# Patient Record
Sex: Female | Born: 1935
Health system: Southern US, Community
[De-identification: ages and names within clinical notes are randomized; demographics above are authoritative.]

## PROBLEM LIST (undated history)

## (undated) DIAGNOSIS — M1990 Unspecified osteoarthritis, unspecified site: Secondary | ICD-10-CM

## (undated) DIAGNOSIS — C801 Malignant (primary) neoplasm, unspecified: Secondary | ICD-10-CM

## (undated) DIAGNOSIS — I10 Essential (primary) hypertension: Secondary | ICD-10-CM

## (undated) DIAGNOSIS — E119 Type 2 diabetes mellitus without complications: Secondary | ICD-10-CM

## (undated) DIAGNOSIS — I83893 Varicose veins of bilateral lower extremities with other complications: Secondary | ICD-10-CM

## (undated) DIAGNOSIS — J449 Chronic obstructive pulmonary disease, unspecified: Secondary | ICD-10-CM

## (undated) DIAGNOSIS — N189 Chronic kidney disease, unspecified: Secondary | ICD-10-CM

## (undated) HISTORY — PX: WRIST FRACTURE SURGERY: SHX121

## (undated) HISTORY — DX: Chronic obstructive pulmonary disease, unspecified: J44.9

## (undated) HISTORY — PX: FRACTURE SURGERY: SHX138

## (undated) HISTORY — PX: BLADDER SUSPENSION: SHX72

## (undated) HISTORY — DX: Type 2 diabetes mellitus without complications: E11.9

## (undated) HISTORY — DX: Varicose veins of bilateral lower extremities with other complications: I83.893

## (undated) HISTORY — PX: COLONOSCOPY: SHX174

## (undated) HISTORY — PX: OTHER SURGICAL HISTORY: SHX169

## (undated) HISTORY — DX: Essential (primary) hypertension: I10

## (undated) MED FILL — Iron Sucrose Inj 20 MG/ML (Fe Equiv): INTRAVENOUS | Qty: 10 | Status: AC

---

## 1967-07-18 HISTORY — PX: THYROIDECTOMY: SHX17

## 1970-07-17 DIAGNOSIS — Z90710 Acquired absence of both cervix and uterus: Secondary | ICD-10-CM | POA: Insufficient documentation

## 1970-07-17 HISTORY — PX: ABDOMINAL HYSTERECTOMY: SHX81

## 1970-07-17 HISTORY — PX: CYSTOSCOPY: SUR368

## 1973-07-17 HISTORY — PX: CHOLECYSTECTOMY: SHX55

## 1997-07-17 HISTORY — PX: BREAST EXCISIONAL BIOPSY: SUR124

## 2000-08-27 DIAGNOSIS — E1140 Type 2 diabetes mellitus with diabetic neuropathy, unspecified: Secondary | ICD-10-CM | POA: Insufficient documentation

## 2001-08-02 DIAGNOSIS — J45909 Unspecified asthma, uncomplicated: Secondary | ICD-10-CM | POA: Insufficient documentation

## 2002-02-07 DIAGNOSIS — N393 Stress incontinence (female) (male): Secondary | ICD-10-CM | POA: Insufficient documentation

## 2002-07-30 DIAGNOSIS — E039 Hypothyroidism, unspecified: Secondary | ICD-10-CM | POA: Insufficient documentation

## 2004-08-25 ENCOUNTER — Ambulatory Visit: Payer: Self-pay | Admitting: General Surgery

## 2004-12-06 ENCOUNTER — Ambulatory Visit: Payer: Self-pay | Admitting: Family Medicine

## 2005-09-07 ENCOUNTER — Ambulatory Visit: Payer: Self-pay | Admitting: General Surgery

## 2006-09-18 ENCOUNTER — Ambulatory Visit: Payer: Self-pay | Admitting: General Surgery

## 2007-02-27 ENCOUNTER — Ambulatory Visit: Payer: Self-pay | Admitting: Gastroenterology

## 2007-09-19 ENCOUNTER — Ambulatory Visit: Payer: Self-pay | Admitting: General Surgery

## 2007-12-25 ENCOUNTER — Ambulatory Visit: Payer: Self-pay | Admitting: Family Medicine

## 2008-01-27 ENCOUNTER — Ambulatory Visit: Payer: Self-pay | Admitting: Family Medicine

## 2008-02-06 ENCOUNTER — Ambulatory Visit: Payer: Self-pay | Admitting: Family Medicine

## 2008-09-22 ENCOUNTER — Ambulatory Visit: Payer: Self-pay | Admitting: General Surgery

## 2009-09-24 ENCOUNTER — Ambulatory Visit: Payer: Self-pay | Admitting: General Surgery

## 2010-04-05 ENCOUNTER — Ambulatory Visit: Payer: Self-pay | Admitting: Gastroenterology

## 2010-04-12 ENCOUNTER — Ambulatory Visit: Payer: Self-pay | Admitting: Gastroenterology

## 2010-09-26 ENCOUNTER — Ambulatory Visit: Payer: Self-pay | Admitting: General Surgery

## 2011-06-30 ENCOUNTER — Ambulatory Visit: Payer: Self-pay | Admitting: Family Medicine

## 2011-07-18 HISTORY — PX: JOINT REPLACEMENT: SHX530

## 2011-08-02 ENCOUNTER — Inpatient Hospital Stay: Payer: Self-pay | Admitting: Specialist

## 2011-08-02 DIAGNOSIS — Z8781 Personal history of (healed) traumatic fracture: Secondary | ICD-10-CM | POA: Insufficient documentation

## 2011-08-02 LAB — COMPREHENSIVE METABOLIC PANEL
Alkaline Phosphatase: 74 U/L (ref 50–136)
BUN: 26 mg/dL — ABNORMAL HIGH (ref 7–18)
Calcium, Total: 8.9 mg/dL (ref 8.5–10.1)
Co2: 29 mmol/L (ref 21–32)
EGFR (African American): >60
EGFR (Non-African Amer.): 56 — ABNORMAL LOW
Glucose: 131 mg/dL — ABNORMAL HIGH (ref 65–99)
Osmolality: 293 (ref 275–301)
SGPT (ALT): 22 U/L
Sodium: 144 mmol/L (ref 136–145)

## 2011-08-02 LAB — CBC
HCT: 32.8 % — ABNORMAL LOW (ref 35.0–47.0)
HGB: 11.1 g/dL — ABNORMAL LOW (ref 12.0–16.0)
MCV: 92 fL (ref 80–100)
Platelet: 168 10*3/uL (ref 150–440)
RBC: 3.56 10*6/uL — ABNORMAL LOW (ref 3.80–5.20)
RDW: 12.9 % (ref 11.5–14.5)
WBC: 7 10*3/uL (ref 3.6–11.0)

## 2011-08-02 LAB — APTT: Activated PTT: 32.5 secs (ref 23.6–35.9)

## 2011-08-02 LAB — URINALYSIS, COMPLETE
Glucose,UR: NEGATIVE mg/dL (ref 0–75)
Nitrite: NEGATIVE
Specific Gravity: 1.015 (ref 1.003–1.030)
WBC UR: 3 /HPF (ref 0–5)

## 2011-08-02 LAB — CK TOTAL AND CKMB (NOT AT ARMC): CK-MB: 0.6 ng/mL (ref 0.5–3.6)

## 2011-08-02 LAB — PROTIME-INR
INR: 0.9
Prothrombin Time: 12.6 secs (ref 11.5–14.7)

## 2011-08-03 LAB — CBC WITH DIFFERENTIAL/PLATELET
Basophil #: 0 10*3/uL (ref 0.0–0.1)
Eosinophil #: 0.3 10*3/uL (ref 0.0–0.7)
Eosinophil %: 2.3 %
HCT: 29.1 % — ABNORMAL LOW (ref 35.0–47.0)
Lymphocyte #: 1.9 10*3/uL (ref 1.0–3.6)
Lymphocyte %: 15 %
MCHC: 33.9 g/dL (ref 32.0–36.0)
Monocyte %: 11.5 %
Neutrophil #: 9.1 10*3/uL — ABNORMAL HIGH (ref 1.4–6.5)
RBC: 3.24 10*6/uL — ABNORMAL LOW (ref 3.80–5.20)
RDW: 14.4 % (ref 11.5–14.5)
WBC: 12.7 10*3/uL — ABNORMAL HIGH (ref 3.6–11.0)

## 2011-08-03 LAB — BASIC METABOLIC PANEL
Anion Gap: 13 (ref 7–16)
BUN: 23 mg/dL — ABNORMAL HIGH (ref 7–18)
Calcium, Total: 7.8 mg/dL — ABNORMAL LOW (ref 8.5–10.1)
EGFR (African American): 41 — ABNORMAL LOW
Glucose: 135 mg/dL — ABNORMAL HIGH (ref 65–99)
Potassium: 5.3 mmol/L — ABNORMAL HIGH (ref 3.5–5.1)
Sodium: 142 mmol/L (ref 136–145)

## 2011-08-03 LAB — POTASSIUM: Potassium: 4.9 mmol/L (ref 3.5–5.1)

## 2011-08-03 LAB — HEMOGLOBIN: HGB: 9.2 g/dL — ABNORMAL LOW (ref 12.0–16.0)

## 2011-08-04 LAB — BASIC METABOLIC PANEL
Anion Gap: 13 (ref 7–16)
BUN: 23 mg/dL — ABNORMAL HIGH (ref 7–18)
Co2: 23 mmol/L (ref 21–32)
Creatinine: 1.38 mg/dL — ABNORMAL HIGH (ref 0.60–1.30)
EGFR (African American): 47 — ABNORMAL LOW
EGFR (Non-African Amer.): 39 — ABNORMAL LOW
Glucose: 166 mg/dL — ABNORMAL HIGH (ref 65–99)
Potassium: 4.6 mmol/L (ref 3.5–5.1)

## 2011-08-05 LAB — CBC WITH DIFFERENTIAL/PLATELET
Basophil #: 0.2 10*3/uL — ABNORMAL HIGH (ref 0.0–0.1)
Basophil %: 1.3 %
Basophil: 1 %
Comment - H1-Com1: NORMAL
Eosinophil #: 0.4 10*3/uL (ref 0.0–0.7)
Eosinophil %: 2.7 %
Eosinophil: 1 %
HGB: 7 g/dL — ABNORMAL LOW (ref 12.0–16.0)
HGB: 6.3 g/dL — ABNORMAL LOW (ref 12.0–16.0)
Lymphocyte %: 12.3 %
Lymphocytes: 17 %
MCH: 30.6 pg (ref 26.0–34.0)
MCHC: 33.8 g/dL (ref 32.0–36.0)
Monocytes: 9 %
Neutrophil %: 70.6 %
Platelet: 112 10*3/uL — ABNORMAL LOW (ref 150–440)
Platelet: 110 10*3/uL — ABNORMAL LOW (ref 150–440)
RBC: 2.3 10*6/uL — ABNORMAL LOW (ref 3.80–5.20)
RDW: 14.3 % (ref 11.5–14.5)
Segmented Neutrophils: 72 %
WBC: 15.5 10*3/uL — ABNORMAL HIGH (ref 3.6–11.0)

## 2011-08-05 LAB — BASIC METABOLIC PANEL
Anion Gap: 9 (ref 7–16)
BUN: 25 mg/dL — ABNORMAL HIGH (ref 7–18)
Calcium, Total: 7.7 mg/dL — ABNORMAL LOW (ref 8.5–10.1)
Chloride: 101 mmol/L (ref 98–107)
Glucose: 142 mg/dL — ABNORMAL HIGH (ref 65–99)
Osmolality: 279 (ref 275–301)
Potassium: 4.9 mmol/L (ref 3.5–5.1)
Sodium: 136 mmol/L (ref 136–145)

## 2011-08-06 LAB — CBC WITH DIFFERENTIAL/PLATELET
Basophil #: 0.1 10*3/uL (ref 0.0–0.1)
Basophil %: 0.5 %
Eosinophil #: 0.4 10*3/uL (ref 0.0–0.7)
Eosinophil %: 3.9 %
HCT: 28.7 % — ABNORMAL LOW (ref 35.0–47.0)
Lymphocyte #: 2.1 10*3/uL (ref 1.0–3.6)
MCH: 30.4 pg (ref 26.0–34.0)
MCHC: 33.7 g/dL (ref 32.0–36.0)
Monocyte #: 1.6 10*3/uL — ABNORMAL HIGH (ref 0.0–0.7)
Monocyte %: 14 %
Neutrophil #: 7.4 10*3/uL — ABNORMAL HIGH (ref 1.4–6.5)
Neutrophil %: 63.7 %
Platelet: 124 10*3/uL — ABNORMAL LOW (ref 150–440)
RBC: 3.18 10*6/uL — ABNORMAL LOW (ref 3.80–5.20)
WBC: 11.6 10*3/uL — ABNORMAL HIGH (ref 3.6–11.0)

## 2011-08-06 LAB — BASIC METABOLIC PANEL
BUN: 27 mg/dL — ABNORMAL HIGH (ref 7–18)
Calcium, Total: 8.2 mg/dL — ABNORMAL LOW (ref 8.5–10.1)
Creatinine: 1.22 mg/dL (ref 0.60–1.30)
EGFR (African American): 55 — ABNORMAL LOW
EGFR (Non-African Amer.): 45 — ABNORMAL LOW
Glucose: 114 mg/dL — ABNORMAL HIGH (ref 65–99)
Osmolality: 284 (ref 275–301)
Potassium: 4.6 mmol/L (ref 3.5–5.1)
Sodium: 139 mmol/L (ref 136–145)

## 2011-08-07 LAB — CBC WITH DIFFERENTIAL/PLATELET
Basophil #: 0 10*3/uL (ref 0.0–0.1)
Basophil %: 0.2 %
Eosinophil #: 0.3 10*3/uL (ref 0.0–0.7)
Eosinophil %: 3 %
HGB: 8.3 g/dL — ABNORMAL LOW (ref 12.0–16.0)
Lymphocyte #: 1.5 10*3/uL (ref 1.0–3.6)
Lymphocyte %: 16.1 %
MCH: 30.8 pg (ref 26.0–34.0)
MCHC: 33.1 g/dL (ref 32.0–36.0)
Monocyte %: 11.8 %
Neutrophil %: 68.9 %
RBC: 2.69 10*6/uL — ABNORMAL LOW (ref 3.80–5.20)
WBC: 9.3 10*3/uL (ref 3.6–11.0)

## 2011-08-17 ENCOUNTER — Encounter: Payer: Self-pay | Admitting: Internal Medicine

## 2011-08-17 LAB — CBC WITH DIFFERENTIAL/PLATELET
Eosinophil #: 0 10*3/uL (ref 0.0–0.7)
HCT: 34.6 % — ABNORMAL LOW (ref 35.0–47.0)
HGB: 11.6 g/dL — ABNORMAL LOW (ref 12.0–16.0)
MCH: 31.3 pg (ref 26.0–34.0)
MCHC: 33.6 g/dL (ref 32.0–36.0)
MCV: 93 fL (ref 80–100)
Monocyte #: 0.5 10*3/uL (ref 0.0–0.7)
Monocyte %: 3.7 %
Neutrophil #: 8.6 10*3/uL — ABNORMAL HIGH (ref 1.4–6.5)
RDW: 15.1 % — ABNORMAL HIGH (ref 11.5–14.5)
WBC: 12.5 10*3/uL — ABNORMAL HIGH (ref 3.6–11.0)

## 2011-08-18 ENCOUNTER — Encounter: Payer: Self-pay | Admitting: Internal Medicine

## 2012-07-22 ENCOUNTER — Ambulatory Visit: Payer: Self-pay | Admitting: General Surgery

## 2012-08-01 DIAGNOSIS — M810 Age-related osteoporosis without current pathological fracture: Secondary | ICD-10-CM | POA: Insufficient documentation

## 2012-11-08 ENCOUNTER — Inpatient Hospital Stay: Payer: Self-pay | Admitting: Internal Medicine

## 2012-11-08 LAB — CBC WITH DIFFERENTIAL/PLATELET
Basophil #: 0.1 10*3/uL (ref 0.0–0.1)
Basophil %: 0.3 %
HCT: 33.7 % — ABNORMAL LOW (ref 35.0–47.0)
Lymphocyte #: 0.9 10*3/uL — ABNORMAL LOW (ref 1.0–3.6)
MCH: 30.6 pg (ref 26.0–34.0)
MCHC: 32.9 g/dL (ref 32.0–36.0)
Monocyte #: 1.5 x10 3/mm — ABNORMAL HIGH (ref 0.2–0.9)
Monocyte %: 7 %
Neutrophil %: 88.7 %
RBC: 3.63 10*6/uL — ABNORMAL LOW (ref 3.80–5.20)
RDW: 13.9 % (ref 11.5–14.5)
WBC: 22 10*3/uL — ABNORMAL HIGH (ref 3.6–11.0)

## 2012-11-08 LAB — URINALYSIS, COMPLETE
Glucose,UR: NEGATIVE mg/dL (ref 0–75)
Ph: 5 (ref 4.5–8.0)
Specific Gravity: 1.015 (ref 1.003–1.030)
Squamous Epithelial: 2

## 2012-11-08 LAB — COMPREHENSIVE METABOLIC PANEL
Albumin: 3.5 g/dL (ref 3.4–5.0)
Anion Gap: 9 (ref 7–16)
Bilirubin,Total: 0.7 mg/dL (ref 0.2–1.0)
Co2: 23 mmol/L (ref 21–32)
Creatinine: 1.81 mg/dL — ABNORMAL HIGH (ref 0.60–1.30)
EGFR (African American): 30 — ABNORMAL LOW
Osmolality: 289 (ref 275–301)
Potassium: 3.5 mmol/L (ref 3.5–5.1)
SGOT(AST): 35 U/L (ref 15–37)
SGPT (ALT): 21 U/L (ref 12–78)
Sodium: 137 mmol/L (ref 136–145)

## 2012-11-08 LAB — TROPONIN I
Troponin-I: 0.22 ng/mL — ABNORMAL HIGH
Troponin-I: 1.88 ng/mL — ABNORMAL HIGH

## 2012-11-08 LAB — CK TOTAL AND CKMB (NOT AT ARMC): CK, Total: 158 U/L (ref 21–215)

## 2012-11-09 LAB — LIPID PANEL
Cholesterol: 102 mg/dL (ref 0–200)
HDL Cholesterol: 34 mg/dL — ABNORMAL LOW (ref 40–60)
VLDL Cholesterol, Calc: 15 mg/dL (ref 5–40)

## 2012-11-09 LAB — BASIC METABOLIC PANEL
BUN: 28 mg/dL — ABNORMAL HIGH (ref 7–18)
Calcium, Total: 7.1 mg/dL — ABNORMAL LOW (ref 8.5–10.1)
Chloride: 109 mmol/L — ABNORMAL HIGH (ref 98–107)
Co2: 24 mmol/L (ref 21–32)
EGFR (Non-African Amer.): 36 — ABNORMAL LOW
Osmolality: 281 (ref 275–301)
Potassium: 4.2 mmol/L (ref 3.5–5.1)

## 2012-11-09 LAB — CBC WITH DIFFERENTIAL/PLATELET
Basophil %: 0.4 %
Eosinophil #: 0.1 10*3/uL (ref 0.0–0.7)
HGB: 9.3 g/dL — ABNORMAL LOW (ref 12.0–16.0)
Lymphocyte #: 2.2 10*3/uL (ref 1.0–3.6)
Lymphocyte %: 18 %
MCH: 30.3 pg (ref 26.0–34.0)
MCV: 93 fL (ref 80–100)
Monocyte #: 2.1 x10 3/mm — ABNORMAL HIGH (ref 0.2–0.9)
Monocyte %: 17.4 %
Neutrophil %: 63.7 %

## 2012-11-10 LAB — BASIC METABOLIC PANEL
Anion Gap: 6 — ABNORMAL LOW (ref 7–16)
BUN: 22 mg/dL — ABNORMAL HIGH (ref 7–18)
Calcium, Total: 7 mg/dL — CL (ref 8.5–10.1)
Chloride: 112 mmol/L — ABNORMAL HIGH (ref 98–107)
Co2: 23 mmol/L (ref 21–32)
EGFR (African American): 55 — ABNORMAL LOW
EGFR (Non-African Amer.): 47 — ABNORMAL LOW
Glucose: 129 mg/dL — ABNORMAL HIGH (ref 65–99)
Osmolality: 286 (ref 275–301)
Potassium: 3.8 mmol/L (ref 3.5–5.1)
Sodium: 141 mmol/L (ref 136–145)

## 2012-11-10 LAB — CBC WITH DIFFERENTIAL/PLATELET
Lymphocyte #: 1.7 10*3/uL (ref 1.0–3.6)
Lymphocyte %: 25.4 %
MCH: 30.7 pg (ref 26.0–34.0)
MCHC: 33.3 g/dL (ref 32.0–36.0)
Neutrophil #: 3.9 10*3/uL (ref 1.4–6.5)
Platelet: 118 10*3/uL — ABNORMAL LOW (ref 150–440)
RBC: 2.9 10*6/uL — ABNORMAL LOW (ref 3.80–5.20)
RDW: 13.6 % (ref 11.5–14.5)

## 2012-11-10 LAB — LIPID PANEL
Cholesterol: 96 mg/dL (ref 0–200)
HDL Cholesterol: 36 mg/dL — ABNORMAL LOW (ref 40–60)

## 2012-11-14 LAB — CULTURE, BLOOD (SINGLE)

## 2013-01-14 ENCOUNTER — Encounter: Payer: Self-pay | Admitting: *Deleted

## 2013-07-23 ENCOUNTER — Ambulatory Visit: Payer: Self-pay | Admitting: General Surgery

## 2013-07-24 ENCOUNTER — Encounter: Payer: Self-pay | Admitting: General Surgery

## 2013-07-30 ENCOUNTER — Ambulatory Visit: Payer: Self-pay | Admitting: General Surgery

## 2013-08-05 ENCOUNTER — Ambulatory Visit (INDEPENDENT_AMBULATORY_CARE_PROVIDER_SITE_OTHER): Payer: Medicare HMO | Admitting: General Surgery

## 2013-08-05 ENCOUNTER — Encounter: Payer: Self-pay | Admitting: General Surgery

## 2013-08-05 VITALS — BP 140/58 | HR 66 | Resp 16 | Ht 62.0 in | Wt 141.0 lb

## 2013-08-05 DIAGNOSIS — Z1239 Encounter for other screening for malignant neoplasm of breast: Secondary | ICD-10-CM

## 2013-08-05 DIAGNOSIS — N6019 Diffuse cystic mastopathy of unspecified breast: Secondary | ICD-10-CM

## 2013-08-05 NOTE — Progress Notes (Signed)
Patient ID: Robyn Butler, female   DOB: November 07, 1928, 80 y.o.   MRN: [id number]  Chief Complaint  Patient presents with  . Follow-up    mammogram    HPI Robyn Butler is a 80 y.o. female.  who presents for her annual breast evaluation. The most recent mammogram was done on 07-23-13.  Patient does perform regular self breast checks and gets regular mammograms done.  No new breast issues.  HPI  Past Medical History  Diagnosis Date  . Hypertension   . Asthma   . Thyroid disorder   . Varicose veins of lower extremities with other complications   . Diabetes mellitus without complication   . Glaucoma   . COPD (chronic obstructive pulmonary disease)     Past Surgical History  Procedure Laterality Date  . Thyroidectomy  1969  . Cystoscopy  1972  . Abdominal hysterectomy  1972  . Bladder suspension    . Cholecystectomy  1975  . Colonoscopy    . Salpingo oophorectmy     . Joint replacement  2013    hip    Family History  Problem Relation Age of Onset  . Heart attack Father   . Kidney disease Mother     Social History History  Substance Use Topics  . Smoking status: Former Smoker    Quit date: 07/18/1951  . Smokeless tobacco: Never Used  . Alcohol Use: No    No Known Allergies  Current Outpatient Prescriptions  Medication Sig Dispense Refill  . ADVAIR DISKUS 250-50 MCG/DOSE AEPB Inhale 1 puff into the lungs 2 (two) times daily.       Marland Kitchen albuterol (PROVENTIL HFA;VENTOLIN HFA) 108 (90 BASE) MCG/ACT inhaler Inhale into the lungs every 6 (six) hours as needed for wheezing or shortness of breath.      Marland Kitchen amitriptyline (ELAVIL) 10 MG tablet Take 10 mg by mouth at bedtime.      Marland Kitchen amLODipine (NORVASC) 10 MG tablet Take 10 mg by mouth daily.      Marland Kitchen aspirin 81 MG tablet Take 81 mg by mouth daily.      . Calcium Carbonate-Vitamin D 500-125 MG-UNIT TABS Take by mouth daily.      . cetirizine (ZYRTEC) 10 MG tablet Take 10 mg by mouth daily.      . dorzolamide (TRUSOPT) 2 %  ophthalmic solution 1 drop 2 (two) times daily.      Marland Kitchen glipiZIDE (GLUCOTROL XL) 2.5 MG 24 hr tablet Take 2.5 mg by mouth daily with breakfast.      . hydrochlorothiazide (HYDRODIURIL) 25 MG tablet Take 25 mg by mouth daily.       Marland Kitchen ibuprofen (ADVIL,MOTRIN) 200 MG tablet Take 200 mg by mouth every 6 (six) hours as needed.      . Iron 66 MG TABS Take by mouth daily.      Marland Kitchen latanoprost (XALATAN) 0.005 % ophthalmic solution 1 drop at bedtime.      Marland Kitchen levothyroxine (SYNTHROID, LEVOTHROID) 100 MCG tablet Take 100 mcg by mouth daily before breakfast.       . lovastatin (ALTOPREV) 40 MG 24 hr tablet Take 40 mg by mouth at bedtime.      . Multiple Vitamins-Minerals (MEGA BASIC PO) Take by mouth daily.      Marland Kitchen omeprazole (PRILOSEC) 20 MG capsule Take 20 mg by mouth daily.        No current facility-administered medications for this visit.    Review of Systems Review of Systems  Constitutional:  Negative.   Respiratory: Negative.   Cardiovascular: Negative.     Blood pressure 140/58, pulse 66, resp. rate 16, height 5\' 2"  (1.575 m), weight 141 lb (63.957 kg).  Physical Exam Physical Exam  Constitutional: She is oriented to person, place, and time. She appears well-developed and well-nourished.  Neck: Neck supple. No thyromegaly present.  Cardiovascular: Normal rate, regular rhythm and normal heart sounds.   No murmur heard. Pulmonary/Chest: Effort normal and breath sounds normal. Right breast exhibits no inverted nipple, no mass, no nipple discharge, no skin change and no tenderness. Left breast exhibits no inverted nipple, no mass, no nipple discharge, no skin change and no tenderness.  Lymphadenopathy:    She has no cervical adenopathy.    She has no axillary adenopathy.  Neurological: She is alert and oriented to person, place, and time.  Skin: Skin is warm and dry.    Data Reviewed Mammogram reviewed-stable  Assessment    Stable breast exam.      Plan    54yr f/u with bil screening  mammogram        Jaelen Soth G 08/05/2013, 8:45 PM

## 2013-08-05 NOTE — Patient Instructions (Signed)
Patient to return in 1 year with bilateral screening mammogram. Patient to call our office with any new questions or concerns. Patient to continue self breast exams.

## 2013-11-27 ENCOUNTER — Encounter: Payer: Self-pay | Admitting: Family Medicine

## 2013-12-15 ENCOUNTER — Encounter: Payer: Self-pay | Admitting: Family Medicine

## 2014-02-16 ENCOUNTER — Ambulatory Visit: Payer: Self-pay | Admitting: Family Medicine

## 2014-02-16 DIAGNOSIS — I517 Cardiomegaly: Secondary | ICD-10-CM | POA: Insufficient documentation

## 2014-02-20 ENCOUNTER — Ambulatory Visit: Payer: Self-pay | Admitting: Family Medicine

## 2014-05-18 ENCOUNTER — Encounter: Payer: Self-pay | Admitting: General Surgery

## 2014-07-24 ENCOUNTER — Ambulatory Visit: Payer: Self-pay | Admitting: General Surgery

## 2014-07-27 ENCOUNTER — Encounter: Payer: Self-pay | Admitting: General Surgery

## 2014-08-04 ENCOUNTER — Encounter: Payer: Self-pay | Admitting: General Surgery

## 2014-08-04 ENCOUNTER — Ambulatory Visit (INDEPENDENT_AMBULATORY_CARE_PROVIDER_SITE_OTHER): Payer: PPO | Admitting: General Surgery

## 2014-08-04 VITALS — BP 140/68 | HR 70 | Resp 12 | Ht 62.0 in | Wt 145.0 lb

## 2014-08-04 DIAGNOSIS — N6019 Diffuse cystic mastopathy of unspecified breast: Secondary | ICD-10-CM

## 2014-08-04 DIAGNOSIS — Z1239 Encounter for other screening for malignant neoplasm of breast: Secondary | ICD-10-CM

## 2014-08-04 NOTE — Progress Notes (Signed)
Patient ID: Robyn Butler, female   DOB: 10-27-1928, 81 y.o.   MRN: [id number]  Chief Complaint  Patient presents with  . Follow-up    mammogram    HPI Robyn Butler is a 81 y.o. female.  who presents for her annual follow up mammogram and breast evaluation. The most recent mammogram was done on 07-24-14.  Patient does perform regular self breast checks and gets regular mammograms done.    HPI  Past Medical History  Diagnosis Date  . Hypertension   . Asthma   . Thyroid disorder   . Varicose veins of lower extremities with other complications   . Diabetes mellitus without complication   . Glaucoma   . COPD (chronic obstructive pulmonary disease)     Past Surgical History  Procedure Laterality Date  . Thyroidectomy  1969  . Cystoscopy  1972  . Abdominal hysterectomy  1972  . Bladder suspension    . Cholecystectomy  1975  . Colonoscopy    . Salpingo oophorectmy     . Joint replacement  2013    hip    Family History  Problem Relation Age of Onset  . Heart attack Father   . Kidney disease Mother     Social History History  Substance Use Topics  . Smoking status: Former Smoker    Quit date: 07/18/1951  . Smokeless tobacco: Never Used  . Alcohol Use: No    No Known Allergies  Current Outpatient Prescriptions  Medication Sig Dispense Refill  . ADVAIR DISKUS 250-50 MCG/DOSE AEPB Inhale 1 puff into the lungs 2 (two) times daily.     Marland Kitchen albuterol (PROVENTIL HFA;VENTOLIN HFA) 108 (90 BASE) MCG/ACT inhaler Inhale into the lungs every 6 (six) hours as needed for wheezing or shortness of breath.    Marland Kitchen amitriptyline (ELAVIL) 10 MG tablet Take 10 mg by mouth at bedtime.    Marland Kitchen amLODipine (NORVASC) 10 MG tablet Take 10 mg by mouth daily.    Marland Kitchen aspirin 81 MG tablet Take 81 mg by mouth daily.    . Calcium Carbonate-Vitamin D 500-125 MG-UNIT TABS Take by mouth daily.    . cetirizine (ZYRTEC) 10 MG tablet Take 10 mg by mouth daily.    . dorzolamide (TRUSOPT) 2 % ophthalmic solution  1 drop 2 (two) times daily.    Marland Kitchen glipiZIDE (GLUCOTROL XL) 2.5 MG 24 hr tablet Take 2.5 mg by mouth daily with breakfast.    . hydrochlorothiazide (HYDRODIURIL) 25 MG tablet Take 25 mg by mouth daily.     Marland Kitchen ibuprofen (ADVIL,MOTRIN) 200 MG tablet Take 200 mg by mouth every 6 (six) hours as needed.    . Iron 66 MG TABS Take by mouth daily.    Marland Kitchen latanoprost (XALATAN) 0.005 % ophthalmic solution 1 drop at bedtime.    Marland Kitchen levothyroxine (SYNTHROID, LEVOTHROID) 100 MCG tablet Take 100 mcg by mouth daily before breakfast.     . lovastatin (ALTOPREV) 40 MG 24 hr tablet Take 40 mg by mouth at bedtime.    . Multiple Vitamins-Minerals (MEGA BASIC PO) Take by mouth daily.    Marland Kitchen omeprazole (PRILOSEC) 20 MG capsule Take 20 mg by mouth daily.      No current facility-administered medications for this visit.    Review of Systems Review of Systems  Constitutional: Negative.   Respiratory: Negative.   Cardiovascular: Negative.     Blood pressure 140/68, pulse 70, resp. rate 12, height 5\' 2"  (1.575 m), weight 145 lb (65.772 kg).  Physical  Exam Physical Exam  Constitutional: She is oriented to person, place, and time. She appears well-developed and well-nourished.  Eyes: Conjunctivae are normal. No scleral icterus.  Neck: Neck supple.  Cardiovascular: Normal rate, regular rhythm and normal heart sounds.   Pulmonary/Chest: Effort normal and breath sounds normal. Right breast exhibits no inverted nipple, no mass, no nipple discharge, no skin change and no tenderness. Left breast exhibits no inverted nipple, no mass, no nipple discharge, no skin change and no tenderness.  Abdominal: Soft. Bowel sounds are normal. There is no tenderness.  Lymphadenopathy:    She has no cervical adenopathy.    She has no axillary adenopathy.  Neurological: She is alert and oriented to person, place, and time.  Skin: Skin is warm and dry.    Data Reviewed Mammogram Reviewed-stable with benign calcifications  Assessment     Stable exam. FCD- no new issues      Plan    Patient will be asked to return to the office in one year with a bilateral screening mammogram.       Lashone Stauber G 08/04/2014, 10:06 AM

## 2014-08-04 NOTE — Patient Instructions (Addendum)
Patient will be asked to return to the office in one year with a bilateral screening mammogram.  Continue self breast exams. Call office for any new breast issues or concerns.  

## 2014-10-12 ENCOUNTER — Ambulatory Visit: Payer: Self-pay | Admitting: Family Medicine

## 2014-10-27 LAB — HM DIABETES EYE EXAM

## 2014-11-06 NOTE — Consult Note (Signed)
General Aspect 81 yo female admitted after being found on the floor after being too weak to stand up after gettin gout of bed. She has had nausea, vomiting and diarrhea for the past several days. She denied any chest pain. She staes she was trying to get out of bed but was too eak to stand and fell to the floor. She was found to be somewhat dehydrated on presenttion. Initial troponin was minimally elevated and second level was 1.0. EKG did not reveal any injury or ischemia. She denies chest pain.   Physical Exam:  GEN no acute distress   HEENT PERRL   NECK supple   RESP normal resp effort  clear BS  no use of accessory muscles   CARD Regular rate and rhythm  No murmur   ABD denies tenderness  no hernia   LYMPH negative neck, negative axillae   EXTR negative cyanosis/clubbing, negative edema   SKIN normal to palpation   NEURO cranial nerves intact, motor/sensory function intact   PSYCH A+O to time, place, person   Review of Systems:  General: Fatigue  Weakness   Skin: No Complaints   ENT: No Complaints   Eyes: No Complaints   Neck: No Complaints   Respiratory: No Complaints   Cardiovascular: No Complaints   Gastrointestinal: Nausea  Diarrhea   Genitourinary: No Complaints   Vascular: No Complaints   Musculoskeletal: No Complaints   Neurologic: No Complaints   Hematologic: No Complaints   Endocrine: No Complaints   Psychiatric: No Complaints   Review of Systems: All other systems were reviewed and found to be negative   Medications/Allergies Reviewed Medications/Allergies reviewed   Home Medications: Medication Instructions Status  levothyroxine 100 mcg (0.1 mg) oral capsule 1 cap(s) orally once a day Active  lovastatin 40 mg oral tablet 1 tab(s) orally once a day Active  Norvasc 5 mg oral tablet 1 tab(s) orally once a day Active  omeprazole 20 mg oral delayed release capsule 1 cap(s) orally once a day Active  amitriptyline 10 mg oral tablet 1-2  tab(s) orally once a day (at bedtime) Active  Zyrtec 10 mg oral tablet 1 tab(s) orally once a day Active  aspirin 81 mg oral tablet 1 tab(s) orally once a day Active  ferrous sulfate 325 mg (65 mg elemental iron) oral tablet 1 tab(s) orally 2 times a day Active  Glucovance 2.5 mg-500 mg oral tablet 1 tab(s) orally once a day Active  Prinzide 20 mg-12.5 mg oral tablet 2 tab(s) orally once a day Active  Advair Diskus 250 mcg-50 mcg inhalation powder 1 puff(s) inhaled 2 times a day Active  alendronate 70 mg oral tablet 1 tab(s) orally once a week Active  meloxicam 7.5 mg oral tablet 1 tab(s) orally once a day Active   EKG:  EKG NSR   Abnormal NSSTTW changes    Hespan: Unknown   Impression 81 yo female with history of nausea, vomiting and diarrhea for the past several days who was admitted after presenting to the er with comlaints of being too wak to stand at home. She was found on the floor after attempting to get out of bed. She was found to be dehydrated and has a mild troponin elevation to 1.88. etiology of this is unclear. The patient does not give a historoy of acute onset of chest pian and this is likely secondary to demand ischemia in face on dehydration and fall. Will review echo when available to ealuate wall motion.   Plan  1. Conintue with current meds holding afterload reduction and diuretics following renal funciotn 2. Echo when available 3. Gentle hydraton. 4 Further recs pending course.   Electronic Signatures: Teodoro Spray (MD)  (Signed 26-Apr-14 17:27)  Authored: General Aspect/Present Illness, History and Physical Exam, Review of System, Home Medications, EKG , Allergies, Impression/Plan   Last Updated: 26-Apr-14 17:27 by Teodoro Spray (MD)

## 2014-11-06 NOTE — Discharge Summary (Signed)
PATIENT NAME:  Robyn Butler, Robyn Butler MR#:  [id number] DATE OF BIRTH:  04-23-1929  DATE OF ADMISSION:  11/08/2012 DATE OF DISCHARGE:  11/10/2012  ADMITTING DIAGNOSES:  Weakness, nausea, vomiting, diarrhea.   DISCHARGE DIAGNOSES: 1.  Weakness due to dehydration.  2.  Nausea, vomiting, diarrhea, likely due to acute gastroenteritis, now resolved.  3.  Acute renal failure due to dehydration, status post treatment with IV fluids. Renal function back to normal.  4.  Elevated troponin without any chest pain or EKG changes.  Seen by cardiology, did not feel that this was related to ischemia. Dr. Ubaldo Glassing wants to see the patient as an outpatient next week and he will likely do a stress test. Echo did not show any significant wall motion abnormality.  5.  Possible urinary tract infection based on her urinalysis. The patient will be treated with Cipro.  6.  Anemia, likely anemia of chronic disease. Needs outpatient evaluation with her primary care provider, including she may need a colonoscopy.   PERTINENT EVALUATIONS: Urinalysis showed greater than 100,000 E. coli. UA showed 3+ bacteria, nitrites positive. WBC count was 22.0, hemoglobin 11.1, platelet count was 172. Troponin was 0.22, subsequent troponin was elevated to 1.88. Blood cultures no growth at 48 hours. CT of the abdomen and pelvis showed no definite acute abnormality, extensive sigmoid colon diverticulosis, extensive atherosclerotic calcification present, no ascites or abscess evidence. Lipid panel: Total cholesterol 102, triglycerides 76, HDL 34, LDL 53. Echocardiogram of the heart showed LVEF of 65% to 70%, mild mitral valve regurg, mild tricuspid regurg, mild elevated pulmonary hypertension. Most recent creatinine today is 1.10, sodium 141. Hemoglobin today is 8.9, platelet count is 118.   CONSULTANTS: Dr. Ubaldo Glassing.   HOSPITAL COURSE: Please refer to H and P done by the admitting physician. The patient is a pleasant 81 year old white female who woke up on the  day of admission and could not get up. The patient slid to the floor and was very weak to get up. The patient has had nausea and vomiting for the past few days. She came to the ED, was noted to be severely dehydrated, had acute renal failure. It was felt that her acute renal failure and dehydration and was due to GI volume loss. She was given IV fluids and that resolved. With IV fluids, her acute renal failure resolved as well as her weakness significantly improved. The patient's diarrhea and vomiting also resolved. She has no further GI symptoms, has been tolerating diet well. There was an incidental note of elevated troponin on presentation which increased up to 1.88. The patient did not have any chest pains. No EKG changes to suggest acute ischemia. She was seen by Dr. Ubaldo Glassing of cardiology, who went ahead and did an echocardiogram. Her echocardiogram showed no wall motion abnormality so he wants her to get over her current episode. He will see her in outpatient likely do a stress test. At this time, she is stable for discharge.   DISCHARGE MEDICATIONS: Levothyroxine 100 mcg daily, lovastatin 40 daily, Norvasc 5 daily, omeprazole 20 daily, amitriptyline 20, 1 to 2 tabs at bedtime p.r.n.; Zyrtec 10 daily, aspirin 81 mg 1 tab p.o. daily, Glucovance 2.5 to 500 one tab p.o. daily, Prinzide 20/12.5 two tabs daily, Advair 250/50 one puff b.i.d., alendronate 70 one tablet weekly, meloxicam 7.5 daily, calcium plus vitamin D 1 tab p.o. b.i.d., Cipro 500 one tab p.o. q.12 x 4 days.   DIET: Low sodium, low fat, low cholesterol, carbohydrate-controlled diet.   ACTIVITY:  As tolerated.   FOLLOWUP: With primary MD, Dr. Linton Ham, in 1 to 2 weeks.   TIME SPENT: 35 minutes spent.    ____________________________ Robyn Butler. [name], MD shp:cs D: 11/10/2012 12:56:00 ET T: 11/10/2012 14:36:26 ET JOB#: [id number]  cc: Jenayah Antu H. [name], MD, <Dictator> Alric Seton MD ELECTRONICALLY SIGNED 11/15/2012 20:05

## 2014-11-06 NOTE — H&P (Signed)
PATIENT NAME:  Robyn Butler, Robyn Butler MR#:  [id number] DATE OF BIRTH:  [birth year]  DATE OF ADMISSION:  11/08/2012  PRIMARY CARE PHYSICIAN: Kirstie Peri. Caryn Section, MD  CHIEF COMPLAINT: Weakness.   HISTORY OF PRESENT ILLNESS: This is an 81 year old female who woke up today and could not get up. She slid to the floor and could not get up from the floor. She has a [employer] that she pressed and they took her to the Emergency Room. She has had an upset stomach for the past few days with vomiting and diarrhea. She has not had any more symptoms since last night. She has abdominal pain when the ER physician pressed down in the left lower quadrant. She does have dark stools secondary to iron that she takes. No blood in the vomit. No bright red blood in the diarrhea. The patient said she had some fever, low-grade, and also some sweating this a.m. when she was trying to get up. She feels very weak. In the ER, she was found to be in acute renal failure, a borderline troponin and elevated white count, a urinalysis that was borderline. Hospitalist services were contacted for further evaluation.   PAST MEDICAL HISTORY: Diabetes, glaucoma, COPD, hypothyroidism and hyperlipidemia.   PAST SURGICAL HISTORY: Right partial hip replacement, hysterectomy, cholecystectomy and goiter.   ALLERGIES: IN THE COMPUTER HERE IS HESPAN.   MEDICATIONS: As per Prescription Writer include Advair Diskus 250/50 one inhalation twice a day, alendronate 70 mg once a week, amitriptyline 10 mg 1 to 2 tablets at night, aspirin 81 mg daily, ferrous sulfate 325 mg twice a day, Glucovance 2.5/500 one tablet daily, levothyroxine 100 mcg daily, lovastatin 40 mg daily, meloxicam 7.5 mg daily, Norvasc 5 mg daily, omeprazole 20 mg extended-release daily, Prinzide 20/12.5 two tablets daily, Zyrtec 10 mg daily.   SOCIAL HISTORY: Lives alone. Quit smoking in 1993. No alcohol. No drug use. 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   FAMILY HISTORY: Father died at age 65  in his sleep. He had a history of asthma, likely a cardiac event. Mother died of uremic poisoning.   REVIEW OF SYSTEMS:    CONSTITUTIONAL: Positive for fever. Positive for sweating. No chills. Positive for weakness. Positive for weight gain.  EYES: She does wear glasses and has glaucoma.  EARS, NOSE, MOUTH AND THROAT: Decreased hearing. Decreased runny nose. Positive for sore throat.  CARDIOVASCULAR: No chest pain. No palpitations.  RESPIRATORY: Positive for shortness of breath with occasional cough. No hemoptysis.  GASTROINTESTINAL: Positive for nausea. Positive for vomiting. No hematemesis. Positive for abdominal pain with palpation. Positive for diarrhea. No bright red blood per rectum. Positive for dark stools with iron.  GENITOURINARY: No burning on urination. No hematuria.  MUSCULOSKELETAL: No joint pain or muscle pain.  INTEGUMENTARY: No rashes or eruptions.  NEUROLOGIC: No fainting or blackouts.  PSYCHIATRIC: No anxiety or depression.  ENDOCRINE: No thyroid problems.  HEMATOLOGIC AND LYMPHATIC: No anemia.   PHYSICAL EXAMINATION:  VITAL SIGNS: Temperature 98.6, pulse 81, respirations 18, blood pressure 129/45, pulse oximetry 98% on room air.  GENERAL: No respiratory distress.  EYES: Conjunctivae and lids normal. Pupils equal, round and reactive to light. Extraocular muscles intact. No nystagmus.  EARS, NOSE, MOUTH AND THROAT: Tympanic membranes: No erythema. Nasal mucosa: No erythema. Throat: No erythema, no exudate seen. Lips and gums: No lesions.  NECK: No JVD. No bruits. No lymphadenopathy. No thyromegaly. No thyroid nodules palpated.  LUNGS: Clear to auscultation. No use of accessory muscles to breathe. No  rhonchi, rales or wheeze heard.  CARDIOVASCULAR: S1, S2 normal. No gallops, rubs or murmurs heard. Carotid upstroke 2+ bilaterally. No bruits.  EXTREMITIES: Dorsalis pedis pulses 2+ bilaterally. No edema of the lower extremities.  ABDOMEN: Soft. Positive tenderness in the left  lower quadrant. No organomegaly/splenomegaly. Normoactive bowel sounds. No masses felt.  LYMPHATIC: No lymph nodes in the neck.  MUSCULOSKELETAL: No clubbing, edema or cyanosis.  SKIN: No ulcers or lesions seen.  NEUROLOGIC: Cranial nerves II through XII grossly intact. Deep tendon reflexes 2+ bilateral lower extremities. Sensation intact to light touch. Power 5/5 bilateral upper and lower extremities. Babinski negative bilaterally.  PSYCHIATRIC: The patient is oriented to person, place and time.   LABORATORY AND RADIOLOGICAL DATA: Lactic acid 1.8. Venous pH 7.33. CT scan of the abdomen and pelvis showed no definite acute abnormality, extensive sigmoid colon diverticulosis, extensive atherosclerotic calcification present, no ascites or abscess evident. Glucose 194, BUN 40, creatinine 1.81, sodium 137, potassium 3.5, chloride 105, CO2 of 23, calcium 7.9. Liver function tests normal. GFR 26. Troponin borderline at 0.22. White blood cell count 22.0, hemoglobin and hematocrit 11.1 and 33.7, platelet count 172. Urinalysis: Trace nitrites and trace leukocyte esterase. EKG: Normal sinus rhythm, no acute ST-T wave changes.   ASSESSMENT AND PLAN:  1.  Acute renal failure with dehydration: Likely secondary to the nausea, vomiting and diarrhea. I will give IV fluid hydration with potassium. I will hold Prinzide, Glucovance and Mobic at this time, contraindicated with acute renal failure.  2.  Abdominal pain, nausea, vomiting and diarrhea, also positive urinalysis and leukocytosis: The patient was put on empiric Cipro and Flagyl in the Emergency Room. I will continue that at this point. I will wait for stool studies and urine culture. Most likely this is a viral gastroenteritis though, which should pass on its own. The patient has not had any further nausea, vomiting and diarrhea since last night.  3.  Elevated troponin: Will obtain an echocardiogram. Continue aspirin. Get serial enzymes and monitor on telemetry.  This could be a false positive with the patient's acute renal failure and dehydration.  4.  Diabetes: Sliding scale only. Hold Glucovance at this time.  5.  Hyperlipidemia: Continue Mevacor.  6.  Hypothyroidism: Continue Synthroid.  7.  Chronic obstructive pulmonary disease: Respiratory status stable.  8.  Osteoporosis: Continue Fosamax.  9.  Weakness, unable to get up from bed today or get up from the floor: Will obtain physical therapy consultation and follow their recommendations on whether she needs rehab or home with home health.   TIME SPENT ON ADMISSION: 55 minutes.   CODE STATUS: The patient is a full code.    ____________________________ Tana Conch. Leslye Peer, MD rjw:jm D: 11/08/2012 14:40:08 ET T: 11/08/2012 15:21:52 ET JOB#: [id number]  cc: Tana Conch. Leslye Peer, MD, <Dictator> Kirstie Peri. Caryn Section, MD Marisue Brooklyn MD ELECTRONICALLY SIGNED 11/16/2012 12:57

## 2014-11-08 NOTE — Consult Note (Signed)
PATIENT NAME:  Robyn Butler, Robyn Butler MR#:  [id number] DATE OF BIRTH:  [birth year]  DATE OF CONSULTATION:  08/02/2011  REFERRING PHYSICIAN:  Earnestine Leys, MD  CONSULTING PHYSICIAN:  Deitrick Ferreri S. Manuella Ghazi, MD  PRIMARY CARE PHYSICIAN: Lelon Huh, MD   REASON FOR CONSULTATION: Preop medical clearance.   HISTORY OF PRESENT ILLNESS: The patient is an 81 year old female with a known history of diabetes, hypertension, and chronic obstructive pulmonary disease who is admitted for right hip fracture status post fall. We are being consulted for preop medical clearance. The patient fell at home this morning, started having right hip pain, was brought into the Emergency Department and was found to have a right subcapital femoral fracture. She is planned to have surgery this afternoon by Dr. Sabra Heck. She denies any acute cardiopulmonary symptoms including chest pain or shortness of breath. She seems fairly comfortable. Has difficulty hearing. Her daughter is at the bedside providing most of the information.   PAST MEDICAL HISTORY:  1. Chronic obstructive pulmonary disease.  2. Diabetes.  3. Glaucoma.  4. Hypertension.  5. Hyperlipidemia.  6. Hypothyroidism. 7. Glaucoma.  8. Insomnia.   MEDICATIONS AT HOME:  1. Amitriptyline 10 mg p.o. at bedtime.  2. Aspirin 81 mg p.o. daily.  3. Caltrate with Vitamin D 1 tablet p.o. daily.  4. Dorzolamide 2% ophthalmic solution one drop to each eye at bedtime.  5. Fish Oil 1000 mg p.o. daily.  6. Flovent 110 mcg 2 puffs inhaled twice a day. 7. Glucovance 2.5/500 1 tablet p.o. b.i.d.  8. Latanoprost 0.005% ophthalmic solution one drop to each eye at bedtime.  9. Levothyroxine 100 mcg p.o. daily. 10. Lovastatin 40 mg p.o. daily.  11. Norvasc 5 mg p.o. daily.  12. Omeprazole 20 mg p.o. daily.  13. Prinzide 20/12.5 1 tablet p.o. b.i.d.  14. ProAir 2 puffs inhaled every six hours as needed. 15. Sleep Aid once a day.  16. Zyrtec 10 mg p.o. daily.   ALLERGIES: Hespan.    SOCIAL HISTORY: No smoking. No alcohol.   FAMILY HISTORY: Diabetes in brothers and sisters.   REVIEW OF SYSTEMS: CONSTITUTIONAL: No fever, fatigue, weakness. EYES: No blurry or double vision. Does have history of glaucoma. ENT: Decreased hearing. RESPIRATORY: No cough, wheezing, hemoptysis. CARDIOVASCULAR: No chest pain, orthopnea, edema. GI: No nausea, vomiting, or diarrhea. GU: No dysuria or hematuria. ENDOCRINE: No polyuria or nocturia. History of hypothyroidism. HEMATOLOGY: No anemia or easy bruising. SKIN: No rash or lesion. MUSCULOSKELETAL: Right hip pain. NEUROLOGIC: No tingling, numbness, or weakness. PSYCHIATRIC: No history of anxiety or depression. Positive for insomnia.   PHYSICAL EXAMINATION:   VITAL SIGNS: Temperature 97.4, heart rate 89 per minute, respirations 18 per minute, blood pressure 158/71 mmHg. She is saturating 94% on room air.  GENERAL: The patient is an 81 year old female lying in the bed comfortably without any acute distress.   EYES: Pupils equal, round, and reactive to light and accommodation. No scleral icterus. Extraocular muscles intact.   HEENT: Head atraumatic, normocephalic. Oropharynx and nasopharynx clear.   NECK: Supple. No jugular venous distention. No thyroid enlargement or thyroid tenderness.   LUNGS: Clear to auscultation bilaterally. No wheezing, rales, rhonchi, or crepitation.   CARDIOVASCULAR: S1, S2 normal. No murmur, rubs, or gallop.   ABDOMEN: Soft, nontender, nondistended. Bowel sounds present. No organomegaly or mass.   EXTREMITIES: No pedal edema, cyanosis, or clubbing. She has right hip tenderness around the femoral neck area. Unable to move as she is having significant pain and tenderness.   NEUROLOGIC:  Nonfocal examination. Did not move her right leg as she is having a lot of pain. Left lower extremity within normal limits. Sensation intact. Cranial nerves III to XII intact. Sensation intact.   SKIN: No obvious rash, lesion, or  ulcer.   PSYCHIATRIC: The patient is oriented to time, place, and person x3. She has difficulty hearing.   LABORATORY, DIAGNOSTIC, AND RADIOLOGICAL DATA: Normal BMP. Normal liver function tests except AST of 46. Normal first set of cardiac enzymes. Normal CBC except hemoglobin 11.1, hematocrit 32.8. Normal coagulation panel. Negative urinalysis except 3+ bacteria, 3 WBCs.   Chest x-ray while in the Emergency Department showed fibrotic changes at the bases. Mild cardiomegaly. Right hip x-ray on January 16th showed right subcapital femoral fracture. Pelvic x-ray showed proximal right femoral fracture.   IMPRESSION AND PLAN:  1. Preop medical clearance. She is medically cleared for planned surgery. She will be low to moderate risk considering her underlying hypertension, chronic obstructive pulmonary disease, and diabetes. She and her family members were explained common intraoperative and postoperative complications including, but not limited to, infection, respiratory failure, confusion, anemia, and possible heart attack although she does not have any acute cardiopulmonary symptoms or signs. Discussed with Dr. Earnestine Leys. 2. Diabetes. Will hold off all her oral diabetes medication. Start her on sliding scale insulin.  3. Chronic obstructive pulmonary disease. Start on nebulizer breathing treatment and provide incentive spirometry.  4. Hypothyroidism. Will continue Synthroid. Check TSH.  5. Prophylaxis. Will start her on Protonix DVT prophylaxis per Dr. Sabra Heck.   TOTAL TIME TAKING CARE OF THIS PATIENT: 55 minutes.   ____________________________ Lucina Mellow. Manuella Ghazi, MD vss:drc D: 08/02/2011 11:16:15 ET T: 08/02/2011 11:56:10 ET JOB#: [id number]  cc: Tyannah Sane S. Manuella Ghazi, MD, <Dictator> Kirstie Peri. Caryn Section, MD Park Breed, MD Lucina Mellow [facility] MD ELECTRONICALLY SIGNED 08/03/2011 10:55

## 2014-11-08 NOTE — Discharge Summary (Signed)
PATIENT NAME:  Robyn Butler, Robyn Butler MR#:  [id number] DATE OF BIRTH:  June 10, 1929  DATE OF ADMISSION:  08/02/2011 DATE OF DISCHARGE:  08/08/2011  ADDENDUM:  The patient originally was scheduled to be discharged to skilled nursing on Saturday, 08/05/2011. However, her hemoglobin had dropped to 6.3 and she was transfused 2 units of packed cells over the weekend. Hemoglobin was 8.3 on 08/07/2011. She made slow progress with therapy. She remained afebrile. She was stable and ready for skilled nursing discharge on 08/08/2011 as the insurance company was not opened to provide a new authorization on Monday, Gwynne Edinger day, 08/07/2011. She is to see me in two weeks for exam and x-ray.   ____________________________ Park Breed, MD hem:drc D: 08/08/2011 12:29:38 ET T: 08/08/2011 12:35:43 ET JOB#: [id number]  cc: Park Breed, MD, <Dictator> [place] Caryn Section, MD Park Breed MD ELECTRONICALLY SIGNED 08/09/2011 7:58

## 2014-11-08 NOTE — H&P (Signed)
Subjective/Chief Complaint 81 year old female fell at home this am injuring the right hip. Brought to Emergency Room where exam and X-rays show a displaced subcapital fracture right hip.    History of Present Illness [place] as above.  Discussed treatment with patient and daughter.  Surgery recommended and agreed to by them.  Risks and benefits of surgery were discussed at length including but not limited to infection, non union, nerve or blood vessed damage, non union, need for repeat surgery, blood clots and lung emboli, and death. Will proceed with hemiarthroplasty today as she has been npo.    Past Medical Health Hypertension    Primary Physician Juanetta Beets   Past Med/Surgical Hx:  Glaucoma:   GERD - Esophageal Reflux:   HTN:   Hypothyroidism:   Hypercholesterolemia:   COPD:   Diabetes:   Bladder Surgery:   Thyroidectomy:   Cholecystectomy:   Hysterectomy:   ALLERGIES:  Hespan: Unknown  HOME MEDICATIONS:  Glucovance 2.5 mg-500 mg oral tablet: 1 tab(s) orally 2 times a day, Active  levothyroxine 100 mcg (0.1 mg) oral capsule: 1 cap(s) orally once a day, Active  lovastatin 40 mg oral tablet: 1 tab(s) orally once a day, Active  Prinzide 20 mg-12.5 mg oral tablet: 1 tab(s) orally 2 times a day, Active  Norvasc 5 mg oral tablet: 1 tab(s) orally once a day, Active  omeprazole 20 mg oral delayed release capsule: 1 cap(s) orally once a day, Active  amitriptyline 10 mg oral tablet: 1 tab(s) orally once a day (at bedtime), Active  Zyrtec 10 mg oral tablet: 1 tab(s) orally once a day, Active  aspirin 81 mg oral tablet: 1 tab(s) orally once a day, Active  Caltrate 600 + D oral tablet: 1 tab(s) orally once a day, Active  Fish Oil 1000 mg oral capsule: 1 cap(s) orally once a day, Active  Sleep Aid: 1 tab(s) orally once a day, Active  Flovent HFA 110 mcg/inh inhalation aerosol: 2 puff(s) inhaled 2 times a day, Active  ProAir HFA: 2 puff(s) inhaled every 6 hours, As Needed,  Active  latanoprost 0.005% ophthalmic solution: 1 drop(s) to each affected eye once a day (at bedtime), Active  dorzolamide 2% ophthalmic solution: 1 drop(s) to each affected eye once a day (at bedtime), Active  Family and Social History:   Family History Non-Contributory    Social History negative tobacco, negative ETOH    Place of Living Home   Review of Systems:   Fever/Chills No    Cough No    Sputum No    Abdominal Pain No   Physical Exam:   GEN WD, WN    HEENT pink conjunctivae    CARD regular rate    ABD soft    GU foley catheter in place    EXTR negative edema, Right leg short and externally rotated.  circulation/sensation/motor function good distally.  pain with range of motion.  skin intact.  Sore right buttock.    SKIN normal to palpation    NEURO motor/sensory function intact    PSYCH alert, A+O to time, place, person   Cardiac:  16-Jan-13 08:50    CK, Total 90   CPK-MB, Serum 0.6  Routine Hem:  16-Jan-13 08:50    WBC (CBC) 7.0   RBC (CBC) 3.56   Hemoglobin (CBC) 11.1   Hematocrit (CBC) 32.8   Platelet Count (CBC) 168   MCV 92   MCH 31.1   MCHC 33.8   RDW 12.9  Routine Chem:  16-Jan-13 08:50    Glucose, Serum 131   BUN 26   Creatinine (comp) 1.01   Sodium, Serum 144   Potassium, Serum 4.7   Chloride, Serum 104   CO2, Serum 29   Calcium (Total), Serum 8.9  Hepatic:  16-Jan-13 08:50    Bilirubin, Total 0.5   Alkaline Phosphatase 74   SGPT (ALT) 22   SGOT (AST) 46   Total Protein, Serum 7.7   Albumin, Serum 3.7  Routine Chem:  16-Jan-13 08:50    Osmolality (calc) 293   eGFR (African American) >60   eGFR (Non-African American) 56   Anion Gap 11  Routine Coag:  16-Jan-13 08:50    Prothrombin 12.6   INR 0.9  Routine UA:  16-Jan-13 08:50    Color (UA) Yellow   Clarity (UA) Cloudy   Glucose (UA) Negative   Bilirubin (UA) Negative   Ketones (UA) Negative   Specific Gravity (UA) 1.015   Blood (UA) Negative   pH (UA) 6.0    Protein (UA) Negative   Nitrite (UA) Negative   Leukocyte Esterase (UA) Negative   RBC (UA) 1 /HPF   WBC (UA) 3 /HPF   Bacteria (UA) 3+   Epithelial Cells (UA) 2 /HPF   Mucous (UA) PRESENT  Cardiac:  16-Jan-13 08:50    Troponin I < 0.02  Routine Coag:  16-Jan-13 08:50    Activated PTT (APTT) 32.5  Thyroid:  16-Jan-13 08:50    Thyroid Stimulating Hormone 0.695  Routine BB:  16-Jan-13 10:22    Antibody Screen NEGATIVE   Radiology Results: XRay:    16-Jan-13 09:27, Hip Right Complete   Hip Right Complete   REASON FOR EXAM:    s/p fall  COMMENTS:       PROCEDURE: DXR - DXR HIP RIGHT COMPLETE  - Aug 02 2011  9:27AM     RESULT: Right hip images demonstrate a subcapital right femoral neck   fracture. No comminution is seen. The femoral head remains in the   acetabulum.    IMPRESSION:  Right subcapital femoral fracture.          Verified By: Sundra Aland, M.D., MD    16-Jan-13 09:27, Pelvis AP Only   Pelvis AP Only   REASON FOR EXAM:    preop  COMMENTS:       PROCEDURE: DXR - DXR PELVIS AP ONLY  - Aug 02 2011  9:27AM     RESULT: There is a right subcapital femoral fracture with superior   migration of the shaft. The pelvis appears intact. Degenerative changes   are seen in the spine and sacroiliac joints.    IMPRESSION:  Proximal right femoral fracture.          Verified By: Sundra Aland, M.D., MD     Assessment/Admission Diagnosis Displaced right subcapital hip fracture.    Plan Right hip hemiarthroplasty   Electronic Signatures: Park Breed (MD)  (Signed 16-Jan-13 14:00)  Authored: CHIEF COMPLAINT and HISTORY, PAST MEDICAL/SURGIAL HISTORY, ALLERGIES, HOME MEDICATIONS, FAMILY AND SOCIAL HISTORY, REVIEW OF SYSTEMS, PHYSICAL EXAM, LABS, Radiology, ASSESSMENT AND PLAN   Last Updated: 16-Jan-13 14:00 by Park Breed (MD)

## 2014-11-08 NOTE — Op Note (Signed)
PATIENT NAME:  Robyn Butler, Robyn Butler MR#:  [id number] DATE OF BIRTH:  Jul 22, 1928  DATE OF PROCEDURE:  08/02/2011  PREOPERATIVE DIAGNOSIS: Displaced subcapital fracture right hip.   POSTOPERATIVE DIAGNOSIS: Displaced subcapital fracture right hip.   PROCEDURE PERFORMED: Right hip hemiarthroplasty (#3 Accolade stem, 46-mm unipolar head, -4-mm neck length).   SURGEON: Park Breed, M.D.   ANESTHESIA: Spinal.   COMPLICATIONS: None.   DRAINS: Two Hemovac drains.   ESTIMATED BLOOD LOSS: 400 mL.   REPLACEMENT: None.   DESCRIPTION OF PROCEDURE: The patient was brought to the operating room where she underwent satisfactory spinal anesthesia, was turned to the left lateral decubitus position on the beanbag and padded appropriately. The right hip was prepped and draped in sterile fashion. A posterolateral incision was made and dissection was carried out sharply through subcutaneous tissue. The fascia was divided and Charnley retractor inserted. Electrocautery was used for hemostasis. The posterior capsule was divided and tagged. The sciatic nerve was identified and protected. The short external rotators had been divided and tagged. The femoral neck was cut with an oscillating saw at an appropriate angle and the femoral head was removed. This measured 45 to 46-mm in size. The acetabular was cleared of debris, and the ligament of teres was debrided. The femoral canal was then broached sequentially with trial broaches up to #3 broach, which fit very snugly. Trial reduction was carried out using a 46-mm head. A standard neck length was too tight with the leg being a little long. The -4 neck length seemed to be more appropriate lengthwise. The trials were removed and everything irrigated thoroughly. The #3  Accolade stem was inserted securely. The 46-mm head with a -4 neck length was attached and the hip was reduced. It was quite stable. Leg lengths were excellent. The posterior capsule was closed with #2 Tycron.  The short external rotators were repaired with the same suture. The fascia was closed with 0 Vicryl over a medium Hemovac and the subcutaneous tissue was closed with 2-0 Vicryl over another Hemovac. The skin was closed with staples. A dry sterile dressing was applied and the Hemovac was activated. The patient was transferred to her hospital bed and taken to recovery in good condition. Leg lengths appeared to be virtually equal.    ____________________________ Park Breed, MD hem:bjt D: 08/02/2011 18:12:01 ET T: 08/03/2011 09:52:23 ET JOB#: [id number]  cc: Park Breed, MD, <Dictator> Park Breed MD ELECTRONICALLY SIGNED 08/03/2011 12:10

## 2014-11-08 NOTE — Discharge Summary (Signed)
PATIENT NAME:  Robyn Butler, Robyn Butler MR#:  [id number] DATE OF BIRTH:  07-29-1928  DATE OF ADMISSION:  08/02/2011 DATE OF DISCHARGE:  08/06/2011  ADDENDUM:   Ms. Zegarra was being prepared for discharge to rehab on 08/07/2011. Her daily labs came back with a hematocrit of 20. It was rechecked and she was found to have hematocrit of 18.6. For this reason she was not discharged, but instead transfused 2 units of packed red blood cells. Patient today has hematocrit of 28.7. She was hemodynamically stable with stable vital signs. Patient is afebrile. Patient is currently pending medical evaluation but if she is cleared with medicine she will be allowed to go to skilled nursing facility later on today. Orthopedically she is doing very well and has minimal right hip pain and has a normal examination within the right lower extremity which is the operative site. Patient should have CBC rechecked tomorrow to ensure her hemoglobin and hematocrit remain stable.   ____________________________ Timoteo Gaul, MD klk:cms D: 08/06/2011 11:17:49 ET T: 08/06/2011 11:59:02 ET JOB#: [id number]  cc: Timoteo Gaul, MD, <Dictator> Timoteo Gaul MD ELECTRONICALLY SIGNED 08/06/2011 13:11

## 2014-11-08 NOTE — Discharge Summary (Signed)
PATIENT NAME:  Robyn Butler, BADOLATO MR#:  [id number] DATE OF BIRTH:  12/07/1928  DATE OF ADMISSION:  08/02/2011 DATE OF DISCHARGE:  08/05/2011  PREOPERATIVE DIAGNOSES: 1. Displaced subcapital fracture right hip. 2. Chronic obstructive pulmonary disease. 3. Diabetes mellitus. 4. Glaucoma. 5. Hypertension.  6. Hyperlipidemia.  7. Hypothyroidism.  8. Glaucoma. 9. Insomnia.   POSTOPERATIVE DIAGNOSES: 1. Displaced subcapital fracture right hip. 2. Chronic obstructive pulmonary disease. 3. Diabetes mellitus. 4. Glaucoma. 5. Hypertension.  6. Hyperlipidemia.  7. Hypothyroidism.  8. Glaucoma. 9. Insomnia.   PROCEDURE: 08/02/2011 right hip hemiarthroplasty with a Stryker Accolade hip prosthesis.   COMPLICATIONS: None.   CONSULTATION: [facility].  DISCHARGE MEDICATIONS: 1. Amitriptyline 10 mg at bedtime.  2. Dorzolamide eyedrops.  3. Iron 325 mg daily.  4. Fluticasone inhaler 1 puff b.i.d.  5. HCTZ/lisinopril b.i.d.  6. Insulin coverage as needed. 7. Latanoprost eye drops to both eyes at bedtime.  8. Synthroid 0.1 mg q.a.m.  9. Lovastatin 40 mg at bedtime.  10. Metoprolol XL 25 mg daily. 11. Omega 3 fatty acid capsule daily.  12. OptiChamber. 13. Norco 5/325, 1 to 2 q.4 hours p.r.n. pain.  14. Enteric-coated aspirin one p.o. b.i.d.  15. Surfak p.r.n. constipation.  16. Protonix 40 mg q.a.m.   HISTORY OF PRESENT ILLNESS: Patient is an 81 year old female who fell going to the bathroom early morning of admission. She was brought to the Emergency Room where exam and x-rays revealed a displaced subcapital fracture right hip. Treatment was discussed with the patient and her daughter. Surgery was recommended to facilitate rehabilitation and the patient and her daughter both agreed to this. Risk and postoperative protocol were discussed with them.   PAST MEDICAL HISTORY/ILLNESSES: As above.   ALLERGIES: Hespan.  PRIMARY CARE PHYSICIAN: Dr. Lelon Huh   MEDICATIONS: As above.    REVIEW OF SYSTEMS: Unremarkable.   FAMILY HISTORY: Unremarkable.   SOCIAL HISTORY: Patient lives at home with her husband. Does not smoke.   PHYSICAL EXAMINATION: The patient is alert and cooperative. Her vital signs were normal. She had pain with motion of the right hip and shortening of the right leg and external rotation. The skin was intact and neurovascular status was intact. No other injuries were noted.   LABORATORY, DIAGNOSTIC AND RADIOLOGICAL DATA: Laboratory data on admission was satisfactory.   HOSPITAL COURSE: The patient was seen and cleared for surgery by the [facility] service. Patient was taken to surgery later the day of admission and underwent a hemiarthroplasty of the right hip with a Stryker Accolade prosthesis. Postoperatively she did well overall. She was somewhat slightly hypotensive and hemoglobin was down to 7.6 late the night of surgery and she was transfused 2 units of blood packed cells. Hemoglobin was 8.6 on the second postoperative day. Patient did complain of pain and was making slow progress with therapy. She is felt to be ready for skilled nursing transfer on 08/04/2010. She is to be seen in my office in two weeks. Her rehabilitation potential is good. She is to be partial weight-bearing on the right leg.  ____________________________ Park Breed, MD hem:cms D: 08/04/2011 14:44:00 ET T: 08/04/2011 15:09:23 ET JOB#: [id number]  cc: Park Breed, MD, <Dictator> [place] Caryn Section, MD Park Breed MD ELECTRONICALLY SIGNED 08/06/2011 18:52

## 2014-11-08 NOTE — Consult Note (Signed)
Brief Consult Note: Diagnosis: preop medical clearance.   Patient was seen by consultant.   Consult note dictated.   Recommend to proceed with surgery or procedure.   Orders entered.   Discussed with Attending MD.   Comments: 1. preop medical clearance: medically cleared for planned surgery, low-moderate risk considering underlying chronic obstructive pulmonary disease, diabetes mellitus, common intraop/postop complications explained to patient and family including infection, resp. failure, confusion, anemia and possibly Myocardial Infarction (Heart Attack) although currently she is not having any acute cardio-pulmo s/s and she is clear to go for OR.  2. diabetes mellitus: hold off oral meds, Sliding Scale Insulin for now.  3. chronic obstructive pulmonary disease: nebs and incentive spirometry postop  4. hypothyroidism: continue synthroiod, check tsh  5. prophylaxis: per ortho, start protonix,.  Electronic Signatures: Remer Macho (MD)  (Signed 16-Jan-13 11:05)  Authored: Brief Consult Note   Last Updated: 16-Jan-13 11:05 by Remer Macho (MD)

## 2014-11-20 ENCOUNTER — Ambulatory Visit
Admission: RE | Admit: 2014-11-20 | Discharge: 2014-11-20 | Disposition: A | Discharge status: Home / Self Care | Payer: PPO | Source: Ambulatory Visit | Attending: Family Medicine | Admitting: Family Medicine

## 2014-11-20 ENCOUNTER — Other Ambulatory Visit: Payer: Self-pay | Admitting: Family Medicine

## 2014-11-20 ENCOUNTER — Ambulatory Visit
Admission: RE | Admit: 2014-11-20 | Discharge: 2014-11-20 | Disposition: A | Discharge status: Home / Self Care | Payer: PPO | Attending: Family Medicine | Admitting: Family Medicine

## 2014-11-20 DIAGNOSIS — R05 Cough: Secondary | ICD-10-CM

## 2014-11-20 DIAGNOSIS — R059 Cough, unspecified: Secondary | ICD-10-CM

## 2014-11-20 DIAGNOSIS — J40 Bronchitis, not specified as acute or chronic: Secondary | ICD-10-CM

## 2014-11-20 DIAGNOSIS — J449 Chronic obstructive pulmonary disease, unspecified: Secondary | ICD-10-CM | POA: Diagnosis not present

## 2014-12-23 ENCOUNTER — Telehealth: Payer: Self-pay | Admitting: Family Medicine

## 2014-12-23 NOTE — Telephone Encounter (Signed)
Can stop Actonel, start Boniva 150mg  one tablet each month. #1, rf x 12.

## 2014-12-23 NOTE — Telephone Encounter (Signed)
Patient believes the new med risedronate has caused her to developed a rash on her feet and a cough. Patient stated that she stopped the med Monday 12/21/2014.

## 2014-12-23 NOTE — Telephone Encounter (Signed)
Pt stated that she takes the Actonel once a week on Mondays but this past Saturday 12/19/14 her feet broke out with red spots and feels raw. Pt would like to speak with a nurse. Thanks TNP

## 2014-12-24 MED ORDER — IBANDRONATE SODIUM 150 MG PO TABS: 150.0000 mg | ORAL_TABLET | ORAL | Status: DC

## 2014-12-24 NOTE — Telephone Encounter (Signed)
Patient notified. Expressed understanding.

## 2014-12-25 ENCOUNTER — Other Ambulatory Visit: Payer: Self-pay | Admitting: Family Medicine

## 2015-01-27 ENCOUNTER — Encounter: Payer: Self-pay | Admitting: Family Medicine

## 2015-01-27 ENCOUNTER — Ambulatory Visit (INDEPENDENT_AMBULATORY_CARE_PROVIDER_SITE_OTHER): Payer: PPO | Admitting: Family Medicine

## 2015-01-27 VITALS — BP 138/56 | HR 68 | Temp 97.8°F | Resp 16 | Ht 62.0 in | Wt 144.0 lb

## 2015-01-27 DIAGNOSIS — K649 Unspecified hemorrhoids: Secondary | ICD-10-CM | POA: Insufficient documentation

## 2015-01-27 DIAGNOSIS — J309 Allergic rhinitis, unspecified: Secondary | ICD-10-CM | POA: Insufficient documentation

## 2015-01-27 DIAGNOSIS — I10 Essential (primary) hypertension: Secondary | ICD-10-CM

## 2015-01-27 DIAGNOSIS — L600 Ingrowing nail: Secondary | ICD-10-CM | POA: Insufficient documentation

## 2015-01-27 DIAGNOSIS — E785 Hyperlipidemia, unspecified: Secondary | ICD-10-CM | POA: Diagnosis not present

## 2015-01-27 DIAGNOSIS — K5730 Diverticulosis of large intestine without perforation or abscess without bleeding: Secondary | ICD-10-CM | POA: Insufficient documentation

## 2015-01-27 DIAGNOSIS — R609 Edema, unspecified: Secondary | ICD-10-CM | POA: Insufficient documentation

## 2015-01-27 DIAGNOSIS — R809 Proteinuria, unspecified: Secondary | ICD-10-CM | POA: Insufficient documentation

## 2015-01-27 DIAGNOSIS — D631 Anemia in chronic kidney disease: Secondary | ICD-10-CM | POA: Insufficient documentation

## 2015-01-27 DIAGNOSIS — G629 Polyneuropathy, unspecified: Secondary | ICD-10-CM | POA: Insufficient documentation

## 2015-01-27 DIAGNOSIS — N183 Chronic kidney disease, stage 3 unspecified: Secondary | ICD-10-CM

## 2015-01-27 DIAGNOSIS — E162 Hypoglycemia, unspecified: Secondary | ICD-10-CM | POA: Insufficient documentation

## 2015-01-27 DIAGNOSIS — K219 Gastro-esophageal reflux disease without esophagitis: Secondary | ICD-10-CM | POA: Insufficient documentation

## 2015-01-27 DIAGNOSIS — E875 Hyperkalemia: Secondary | ICD-10-CM | POA: Insufficient documentation

## 2015-01-27 DIAGNOSIS — D649 Anemia, unspecified: Secondary | ICD-10-CM | POA: Insufficient documentation

## 2015-01-27 DIAGNOSIS — L989 Disorder of the skin and subcutaneous tissue, unspecified: Secondary | ICD-10-CM | POA: Insufficient documentation

## 2015-01-27 DIAGNOSIS — H409 Unspecified glaucoma: Secondary | ICD-10-CM | POA: Insufficient documentation

## 2015-01-27 DIAGNOSIS — E1121 Type 2 diabetes mellitus with diabetic nephropathy: Secondary | ICD-10-CM | POA: Diagnosis not present

## 2015-01-27 DIAGNOSIS — H269 Unspecified cataract: Secondary | ICD-10-CM | POA: Insufficient documentation

## 2015-01-27 DIAGNOSIS — M5430 Sciatica, unspecified side: Secondary | ICD-10-CM | POA: Insufficient documentation

## 2015-01-27 LAB — POCT GLYCOSYLATED HEMOGLOBIN (HGB A1C): Hemoglobin A1C: 5.9

## 2015-01-27 NOTE — Progress Notes (Signed)
Subjective:    Patient ID: Robyn Butler, female    DOB: [birth year], 81 y.o.   MRN: [id number]  Hypertension This is a chronic problem. The problem is unchanged. The problem is controlled (Blood pressures at home run about 150's / 60's). Associated symptoms include shortness of breath (Has a history of COPD). Pertinent negatives include no chest pain, neck pain or palpitations. Risk factors for coronary artery disease include diabetes mellitus and dyslipidemia. There are no compliance problems.   Hyperlipidemia This is a chronic problem. The problem is controlled (Last cholesterol 09/24/2014:  Total cholesterol:  154;  Tri:  230;  HDL:  41;  LDL:  67). Associated symptoms include shortness of breath (Has a history of COPD). Pertinent negatives include no chest pain or myalgias. There are no compliance problems.  Risk factors for coronary artery disease include diabetes mellitus, dyslipidemia and hypertension.  Diabetes She presents for her follow-up diabetic visit. She has type 2 diabetes mellitus. Her disease course has been stable (Last A1C was 6.5% on 09/24/2014). There are no hypoglycemic associated symptoms. Associated symptoms include polyuria. Pertinent negatives for diabetes include no chest pain, no foot paresthesias, no foot ulcerations, no polydipsia, no polyphagia, no visual change, no weakness and no weight loss. Symptoms are stable. Risk factors for coronary artery disease include dyslipidemia, diabetes mellitus and hypertension. Current diabetic treatment includes oral agent (monotherapy). She is compliant with treatment all of the time. Her weight is stable. Her overall blood glucose range is 180-200 mg/dl. She sees a podiatrist.Eye exam is current.   She states she occasionally takes a second glipizide in the evening if her sugars is near or above 200.  Patient Active Problem List   Diagnosis Date Noted  . Allergic rhinitis 01/27/2015  . Absolute anemia 01/27/2015  . Cataract  01/27/2015  . Chronic kidney disease (CKD), stage III (moderate) 01/27/2015  . Colon, diverticulosis 01/27/2015  . Accumulation of fluid in tissues 01/27/2015  . Esophageal reflux 01/27/2015  . Glaucoma 01/27/2015  . Hemorrhoid 01/27/2015  . High potassium 01/27/2015  . Hypoglycemia 01/27/2015  . Hypomagnesemia 01/27/2015  . Embedded toenail 01/27/2015  . Microalbuminuria 01/27/2015  . Neuropathy 01/27/2015  . Neuralgia neuritis, sciatic nerve 01/27/2015  . Skin lesion 01/27/2015  . Cardiac enlargement 02/16/2014  . OP (osteoporosis) 08/01/2012  . Personal history of traumatic fracture 08/02/2011  . Arthropathy of pelvic region and thigh 07/07/2009  . Leg varices 01/02/2006  . Diverticulitis of colon 01/20/2004  . Barrett esophagus 10/26/2003  . CAFL (chronic airflow limitation) 04/16/2003  . Aortic valve disorder 04/16/2003  . Adult hypothyroidism 07/30/2002  . Female genuine stress incontinence 02/07/2002  . HLD (hyperlipidemia) 02/07/2002  . Asthma 08/02/2001  . Diabetic neuropathy 08/27/2000  . Essential (primary) hypertension 08/27/2000  . H/O malignant neoplasm of breast 07/17/1997  . History of tobacco use 07/17/1988  . H/O total hysterectomy 07/17/1970   Family History  Problem Relation Age of Onset  . Heart attack Father   . Asthma Father   . Kidney disease Mother   . Hypertension Mother   . Hypertension Other   . Diabetes Other   . Heart attack Other    History   Social History  . Marital Status: Widowed    Spouse Name: N/A  . Number of Children: 2  . Years of Education: 9th Grade   Occupational History  . Retire    Social History Main Topics  . Smoking status: Former Smoker    Quit date: 07/18/1991  .  Smokeless tobacco: Never Used  . Alcohol Use: No  . Drug Use: No  . Sexual Activity: Not on file   Other Topics Concern  . Not on file   Social History Narrative   Past Surgical History  Procedure Laterality Date  . Thyroidectomy  1969  .  Cystoscopy  1972  . Abdominal hysterectomy  1972  . Bladder suspension    . Cholecystectomy  1975  . Colonoscopy    . Salpingo oophorectmy     . Joint replacement Right 2013    hip  . Wrist fracture surgery Left    Allergies  Allergen Reactions  . Alendronate     Other reaction(s): Vomiting   Previous Medications   ALBUTEROL (PROVENTIL HFA;VENTOLIN HFA) 108 (90 BASE) MCG/ACT INHALER    Inhale into the lungs every 6 (six) hours as needed for wheezing or shortness of breath.   AMLODIPINE (NORVASC) 5 MG TABLET    Take by mouth.   ASPIRIN 81 MG TABLET    Take 81 mg by mouth daily.   CALCIUM CARBONATE-VITAMIN D 500-125 MG-UNIT TABS    Take by mouth daily.   CALCIUM CITRATE-VITAMIN D (CITRACAL/VITAMIN D) 250-200 MG-UNIT TABS    Take by mouth.   CETIRIZINE (ZYRTEC) 10 MG TABLET    Take 10 mg by mouth daily.   DIPHENHYDRAMINE-APAP, SLEEP, 25-500 MG CAPS    Take by mouth.   DORZOLAMIDE (TRUSOPT) 2 % OPHTHALMIC SOLUTION    1 drop 2 (two) times daily.   FUROSEMIDE (LASIX) 20 MG TABLET    Take by mouth.   GLIPIZIDE-METFORMIN (METAGLIP) 2.5-500 MG PER TABLET    Take by mouth.   IBANDRONATE (BONIVA) 150 MG TABLET    Take 1 tablet (150 mg total) by mouth every 30 (thirty) days. Take in the morning with a full glass of water, on an empty stomach, and do not take anything else by mouth or lie down for the next 30 min.   IBUPROFEN (ADVIL,MOTRIN) 200 MG TABLET    Take 200 mg by mouth every 6 (six) hours as needed.   IRON 66 MG TABS    Take by mouth daily.   LATANOPROST (XALATAN) 0.005 % OPHTHALMIC SOLUTION    1 drop at bedtime.   LEVOTHYROXINE (SYNTHROID, LEVOTHROID) 100 MCG TABLET    Take 100 mcg by mouth daily before breakfast.    LOSARTAN-HYDROCHLOROTHIAZIDE (HYZAAR) 100-25 MG PER TABLET    Take by mouth.   LOVASTATIN (ALTOPREV) 40 MG 24 HR TABLET    Take 40 mg by mouth at bedtime.   MELOXICAM (MOBIC) 7.5 MG TABLET    Take by mouth.   MULTIPLE VITAMINS-MINERALS (MEGA BASIC PO)    Take by mouth  daily.   NORTRIPTYLINE (PAMELOR) 25 MG CAPSULE    Take by mouth.   OMEGA-3 KRILL OIL 300 MG CAPS    Take by mouth.   OMEPRAZOLE (PRILOSEC) 20 MG CAPSULE    Take 20 mg by mouth daily.    TIOTROPIUM BROMIDE MONOHYDRATE (SPIRIVA RESPIMAT) 2.5 MCG/ACT AERS    Inhale into the lungs.   BP 138/56 mmHg  Pulse 68  Temp(Src) 97.8 F (36.6 C) (Oral)  Resp 16  Ht 5\' 2"  (1.575 m)  Wt 144 lb (65.318 kg)  BMI 26.33 kg/m2     Review of Systems  Constitutional: Negative.  Negative for weight loss.  Respiratory: Positive for shortness of breath (Has a history of COPD). Negative for apnea, cough, choking, chest tightness, wheezing and stridor.  Cardiovascular: Positive for leg swelling. Negative for chest pain and palpitations.  Gastrointestinal: Negative for nausea, vomiting, abdominal pain, diarrhea, constipation (Chronic issue), blood in stool, abdominal distention, anal bleeding and rectal pain.  Endocrine: Positive for polyuria. Negative for cold intolerance, heat intolerance, polydipsia and polyphagia.  Musculoskeletal: Positive for back pain. Negative for myalgias, joint swelling, arthralgias, gait problem, neck pain and neck stiffness.  Neurological: Negative.  Negative for weakness.       Objective:   Physical Exam    General Appearance:    Alert, cooperative, no distress  Eyes:    PERRL, conjunctiva/corneas clear, EOM's intact       Lungs:     Clear to auscultation bilaterally, respirations unlabored  Heart:    Regular rate and rhythm  Neurologic:   Awake, alert, oriented x 3. No apparent focal neurological           defect.       Results for orders placed or performed in visit on 01/27/15  POCT HgB A1C  Result Value Ref Range   Hemoglobin A1C 5.9%            Assessment & Plan:   1. Essential (primary) hypertension well controlled Continue current medications.    2. Hyperlipidemia She is tolerating lovastatin well with no adverse effects.    3. Type 2 diabetes  mellitus with diabetic nephropathy  - POCT HgB A1C  4. Chronic kidney disease (CKD), stage III (moderate) Stable.

## 2015-02-17 ENCOUNTER — Other Ambulatory Visit: Payer: Self-pay | Admitting: Family Medicine

## 2015-03-30 ENCOUNTER — Other Ambulatory Visit: Payer: Self-pay | Admitting: Family Medicine

## 2015-04-17 ENCOUNTER — Other Ambulatory Visit: Payer: Self-pay | Admitting: Family Medicine

## 2015-04-19 ENCOUNTER — Other Ambulatory Visit: Payer: Self-pay | Admitting: Family Medicine

## 2015-04-28 ENCOUNTER — Encounter: Payer: Self-pay | Admitting: Family Medicine

## 2015-04-28 ENCOUNTER — Ambulatory Visit (INDEPENDENT_AMBULATORY_CARE_PROVIDER_SITE_OTHER): Payer: PPO | Admitting: Family Medicine

## 2015-04-28 VITALS — BP 140/52 | HR 77 | Temp 98.3°F | Resp 18 | Ht 62.0 in | Wt 141.0 lb

## 2015-04-28 DIAGNOSIS — E119 Type 2 diabetes mellitus without complications: Secondary | ICD-10-CM | POA: Diagnosis not present

## 2015-04-28 DIAGNOSIS — R42 Dizziness and giddiness: Secondary | ICD-10-CM

## 2015-04-28 DIAGNOSIS — Z23 Encounter for immunization: Secondary | ICD-10-CM | POA: Diagnosis not present

## 2015-04-28 DIAGNOSIS — I10 Essential (primary) hypertension: Secondary | ICD-10-CM | POA: Diagnosis not present

## 2015-04-28 DIAGNOSIS — E039 Hypothyroidism, unspecified: Secondary | ICD-10-CM

## 2015-04-28 LAB — POCT GLYCOSYLATED HEMOGLOBIN (HGB A1C)
Est. average glucose Bld gHb Est-mCnc: 131
Hemoglobin A1C: 6.2

## 2015-04-28 LAB — POCT UA - MICROALBUMIN: MICROALBUMIN (UR) POC: 20 mg/L

## 2015-04-28 NOTE — Progress Notes (Signed)
Patient: Robyn Butler Female    DOB: 09/06/1928   81 y.o.   MRN: [id number] Visit Date: 04/28/2015  Today's Provider: Lelon Huh, MD   Chief Complaint  Patient presents with  . Hypertension    follow up  . Hypothyroidism    follow up  . Diabetes    follow up  . Hyperlipidemia    follow up   Subjective:    HPI  Diabetes Mellitus Type II, Follow-up:   Lab Results  Component Value Date   HGBA1C 5.9% 01/27/2015   Last seen for diabetes 3 months ago.  Management since then includes no changes. She reports good compliance with treatment. She is not having side effects.  Current symptoms include hyperglycemia and have been stable. Home blood sugar records: fasting range: 190's  Episodes of hypoglycemia? no   Current Insulin Regimen:  none Most Recent Eye Exam: 6 months ago Weight trend: stable Prior visit with dietician: no Current diet: in general, an "unhealthy" diet Current exercise: none  ------------------------------------------------------------------------   Hypertension, follow-up:  BP Readings from Last 3 Encounters:  04/28/15 140/52  01/27/15 138/56  08/04/14 140/68    She was last seen for hypertension 3 months ago.  BP at that visit was 138/56. Management since that visit includes no changes.She reports good compliance with treatment. She is not having side effects.  She is not exercising. She is not adherent to low salt diet.   Outside blood pressures are 120's/50's.  Patient denies chest pain, chest pressure/discomfort, claudication, dyspnea, exertional chest pressure/discomfort, fatigue, irregular heart beat, lower extremity edema, near-syncope, orthopnea, palpitations, paroxysmal nocturnal dyspnea, syncope and tachypnea.   Cardiovascular risk factors include advanced age (older than 34 for men, 67 for women), diabetes mellitus, dyslipidemia, hypertension and sedentary lifestyle.  Use of agents associated with hypertension: NSAIDS.    ------------------------------------------------------------------------    Lipid/Cholesterol, Follow-up:   Last seen for this 3 months ago.  Management since that visit includes none.  Last Lipid Panel:  She reports good compliance with treatment. She is not having side effects.   Wt Readings from Last 3 Encounters:  04/28/15 141 lb (63.957 kg)  01/27/15 144 lb (65.318 kg)  08/04/14 145 lb (65.772 kg)    ------------------------------------------------------------------------  Hypothyroidism Follow up: Last office visit was 7 months ago and no changes were made. Patient reports good compliance with treatment, and  good tolerance.  Complains of breif episodes of dizziness for the last couple of weeks that just last a few seconds. Feels flush. No numbness or tingling. No dyspnea, no chest pain. No other neurological symptoms. Occur more days than note.      Allergies  Allergen Reactions  . Alendronate     Other reaction(s): Vomiting   Previous Medications   ALBUTEROL (PROVENTIL HFA;VENTOLIN HFA) 108 (90 BASE) MCG/ACT INHALER    Inhale into the lungs every 6 (six) hours as needed for wheezing or shortness of breath.   AMLODIPINE (NORVASC) 5 MG TABLET    Take 5 mg by mouth daily.    ASPIRIN 81 MG TABLET    Take 81 mg by mouth daily.   DOCUSATE SODIUM (STOOL SOFTENER) 100 MG CAPSULE    Take 100 mg by mouth daily as needed for mild constipation.   DORZOLAMIDE (TRUSOPT) 2 % OPHTHALMIC SOLUTION    1 drop 2 (two) times daily.   FUROSEMIDE (LASIX) 20 MG TABLET    Take 20 mg by mouth daily.    GLIPIZIDE-METFORMIN ([place])  2.5-500 MG PER TABLET    Take 1 tablet by mouth daily.    IBANDRONATE (BONIVA) 150 MG TABLET    Take 1 tablet (150 mg total) by mouth every 30 (thirty) days. Take in the morning with a full glass of water, on an empty stomach, and do not take anything else by mouth or lie down for the next 30 min.   IBUPROFEN (ADVIL,MOTRIN) 200 MG TABLET    Take 200 mg by mouth  every 6 (six) hours as needed.   LATANOPROST (XALATAN) 0.005 % OPHTHALMIC SOLUTION    1 drop at bedtime.   LEVOTHYROXINE (SYNTHROID, LEVOTHROID) 100 MCG TABLET    Take 100 mcg by mouth daily before breakfast.    LOSARTAN-HYDROCHLOROTHIAZIDE (HYZAAR) 100-25 MG PER TABLET    Take 1 tablet by mouth daily.    LOVASTATIN (ALTOPREV) 40 MG 24 HR TABLET    Take 40 mg by mouth at bedtime.   NORTRIPTYLINE (PAMELOR) 25 MG CAPSULE    TAKE ONE CAPSULE AT BEDTIME   OMEPRAZOLE (PRILOSEC) 20 MG CAPSULE    Take 20 mg by mouth daily.    ONE TOUCH ULTRA TEST TEST STRIP    TEST ONCE DAILY   SPIRIVA RESPIMAT 2.5 MCG/ACT AERS    2 PUFFS EVERY DAY    Review of Systems  Constitutional: Negative for fever, chills, appetite change and fatigue.  Respiratory: Negative for chest tightness and shortness of breath.   Cardiovascular: Positive for leg swelling (in ankles). Negative for chest pain and palpitations.  Gastrointestinal: Negative for nausea, vomiting and abdominal pain.  Musculoskeletal: Positive for back pain (left lower back), joint swelling (in fingers) and arthralgias (left hip).  Neurological: Positive for light-headedness (feels like she is going to pass out sometimes). Negative for dizziness and weakness.    Social History  Substance Use Topics  . Smoking status: Former Smoker    Quit date: 07/18/1991  . Smokeless tobacco: Never Used  . Alcohol Use: No   Objective:   BP 140/52 mmHg  Pulse 77  Temp(Src) 98.3 F (36.8 C) (Oral)  Resp 18  Ht 5\' 2"  (1.575 m)  Wt 141 lb (63.957 kg)  BMI 25.78 kg/m2  SpO2 98%  Physical Exam   General Appearance:    Alert, cooperative, no distress  Eyes:    PERRL, conjunctiva/corneas clear, EOM's intact       Lungs:     Clear to auscultation bilaterally, respirations unlabored  Heart:    Regular rate and rhythm. II/VI Systolic murmur, no carotid bruits.   Neurologic:   Awake, alert, oriented x 3. No apparent focal neurological           defect.        Results for orders placed or performed in visit on 04/28/15  POCT HgB A1C  Result Value Ref Range   Hemoglobin A1C 6.2    Est. average glucose Bld gHb Est-mCnc 131   POCT UA - Microalbumin  Result Value Ref Range   Microalbumin Ur, POC 20 mg/L   Creatinine, POC n/a mg/dL   Albumin/Creatinine Ratio, Urine, POC n/a        Assessment & Plan:     1. Type 2 diabetes mellitus without complication, without long-term current use of insulin (HCC) Well controlled.  Continue current medications.   - POCT HgB A1C - POCT UA - Microalbumin  2. Dizziness Episodic and very brief. Check labs as below.  - CBC - Comprehensive metabolic panel  - Troponin I  3.  Need for influenza vaccination  - Flu vaccine HIGH DOSE PF  4. Essential (primary) hypertension Well controlled.  Continue current medications.    5. Hypothyroidism, unspecified hypothyroidism type  - T4 AND TSH  Generally follow up 3 months. Sooner if any issues to address after reviewing labs.       Lelon Huh, MD  [facility]

## 2015-04-29 LAB — COMPREHENSIVE METABOLIC PANEL
ALBUMIN: 4.9 g/dL — AB (ref 3.5–4.7)
ALT: 11 IU/L (ref 0–32)
AST: 18 IU/L (ref 0–40)
Albumin/Globulin Ratio: 1.6 (ref 1.1–2.5)
Alkaline Phosphatase: 76 IU/L (ref 39–117)
BILIRUBIN TOTAL: 0.4 mg/dL (ref 0.0–1.2)
BUN / CREAT RATIO: 33 — AB (ref 11–26)
BUN: 46 mg/dL — AB (ref 8–27)
CHLORIDE: 96 mmol/L — AB (ref 97–108)
CO2: 27 mmol/L (ref 18–29)
CREATININE: 1.4 mg/dL — AB (ref 0.57–1.00)
Calcium: 10 mg/dL (ref 8.7–10.3)
GFR calc Af Amer: 40 mL/min/{1.73_m2} — ABNORMAL LOW (ref >59)
GFR calc non Af Amer: 35 mL/min/{1.73_m2} — ABNORMAL LOW (ref >59)
GLUCOSE: 140 mg/dL — AB (ref 65–99)
Globulin, Total: 3.1 g/dL (ref 1.5–4.5)
Potassium: 4.5 mmol/L (ref 3.5–5.2)
Sodium: 143 mmol/L (ref 134–144)
Total Protein: 8 g/dL (ref 6.0–8.5)

## 2015-04-29 LAB — CBC
HEMATOCRIT: 33.7 % — AB (ref 34.0–46.6)
Hemoglobin: 11 g/dL — ABNORMAL LOW (ref 11.1–15.9)
MCH: 29.6 pg (ref 26.6–33.0)
MCHC: 32.6 g/dL (ref 31.5–35.7)
MCV: 91 fL (ref 79–97)
Platelets: 277 10*3/uL (ref 150–379)
RBC: 3.71 x10E6/uL — AB (ref 3.77–5.28)
RDW: 12.6 % (ref 12.3–15.4)
WBC: 7.8 10*3/uL (ref 3.4–10.8)

## 2015-04-29 LAB — TROPONIN I: Troponin I: <0.01 ng/mL (ref 0.00–0.04)

## 2015-04-29 LAB — T4 AND TSH
T4, Total: 9.6 ug/dL (ref 4.5–12.0)
TSH: 2.21 u[IU]/mL (ref 0.450–4.500)

## 2015-05-24 ENCOUNTER — Ambulatory Visit: Payer: PPO | Admitting: Anesthesiology

## 2015-05-24 ENCOUNTER — Ambulatory Visit
Admission: RE | Admit: 2015-05-24 | Discharge: 2015-05-24 | Disposition: A | Discharge status: Home / Self Care | Payer: PPO | Source: Ambulatory Visit | Attending: Gastroenterology | Admitting: Gastroenterology

## 2015-05-24 ENCOUNTER — Encounter
Admission: RE | Disposition: A | Discharge status: Home / Self Care | Payer: Self-pay | Source: Ambulatory Visit | Attending: Gastroenterology

## 2015-05-24 DIAGNOSIS — K222 Esophageal obstruction: Secondary | ICD-10-CM | POA: Diagnosis not present

## 2015-05-24 DIAGNOSIS — E119 Type 2 diabetes mellitus without complications: Secondary | ICD-10-CM | POA: Diagnosis not present

## 2015-05-24 DIAGNOSIS — Z7984 Long term (current) use of oral hypoglycemic drugs: Secondary | ICD-10-CM | POA: Diagnosis not present

## 2015-05-24 DIAGNOSIS — J449 Chronic obstructive pulmonary disease, unspecified: Secondary | ICD-10-CM | POA: Insufficient documentation

## 2015-05-24 DIAGNOSIS — K5730 Diverticulosis of large intestine without perforation or abscess without bleeding: Secondary | ICD-10-CM | POA: Diagnosis not present

## 2015-05-24 DIAGNOSIS — D175 Benign lipomatous neoplasm of intra-abdominal organs: Secondary | ICD-10-CM | POA: Insufficient documentation

## 2015-05-24 DIAGNOSIS — Z90710 Acquired absence of both cervix and uterus: Secondary | ICD-10-CM | POA: Diagnosis not present

## 2015-05-24 DIAGNOSIS — M1990 Unspecified osteoarthritis, unspecified site: Secondary | ICD-10-CM | POA: Insufficient documentation

## 2015-05-24 DIAGNOSIS — Z87891 Personal history of nicotine dependence: Secondary | ICD-10-CM | POA: Diagnosis not present

## 2015-05-24 DIAGNOSIS — Z79899 Other long term (current) drug therapy: Secondary | ICD-10-CM | POA: Insufficient documentation

## 2015-05-24 DIAGNOSIS — Z7982 Long term (current) use of aspirin: Secondary | ICD-10-CM | POA: Diagnosis not present

## 2015-05-24 DIAGNOSIS — R1310 Dysphagia, unspecified: Secondary | ICD-10-CM | POA: Diagnosis not present

## 2015-05-24 DIAGNOSIS — Z86010 Personal history of colonic polyps: Secondary | ICD-10-CM | POA: Diagnosis present

## 2015-05-24 DIAGNOSIS — D124 Benign neoplasm of descending colon: Secondary | ICD-10-CM | POA: Insufficient documentation

## 2015-05-24 DIAGNOSIS — Z9660 Presence of unspecified orthopedic joint implant: Secondary | ICD-10-CM | POA: Diagnosis not present

## 2015-05-24 DIAGNOSIS — E079 Disorder of thyroid, unspecified: Secondary | ICD-10-CM | POA: Diagnosis not present

## 2015-05-24 DIAGNOSIS — E785 Hyperlipidemia, unspecified: Secondary | ICD-10-CM | POA: Diagnosis not present

## 2015-05-24 DIAGNOSIS — Z7951 Long term (current) use of inhaled steroids: Secondary | ICD-10-CM | POA: Insufficient documentation

## 2015-05-24 DIAGNOSIS — I10 Essential (primary) hypertension: Secondary | ICD-10-CM | POA: Insufficient documentation

## 2015-05-24 DIAGNOSIS — Z8249 Family history of ischemic heart disease and other diseases of the circulatory system: Secondary | ICD-10-CM | POA: Insufficient documentation

## 2015-05-24 DIAGNOSIS — Z888 Allergy status to other drugs, medicaments and biological substances status: Secondary | ICD-10-CM | POA: Diagnosis not present

## 2015-05-24 HISTORY — PX: ESOPHAGOGASTRODUODENOSCOPY: SHX5428

## 2015-05-24 HISTORY — DX: Chronic kidney disease, unspecified: N18.9

## 2015-05-24 HISTORY — DX: Unspecified osteoarthritis, unspecified site: M19.90

## 2015-05-24 HISTORY — DX: Malignant (primary) neoplasm, unspecified: C80.1

## 2015-05-24 HISTORY — PX: COLONOSCOPY WITH PROPOFOL: SHX5780

## 2015-05-24 LAB — GLUCOSE, CAPILLARY: Glucose-Capillary: 152 mg/dL — ABNORMAL HIGH (ref 65–99)

## 2015-05-24 SURGERY — COLONOSCOPY WITH PROPOFOL
Anesthesia: General

## 2015-05-24 MED ORDER — SODIUM CHLORIDE 0.9 % IV SOLN
INTRAVENOUS | Status: DC
  Administered 2015-05-24: 1000 mL via INTRAVENOUS
  Administered 2015-05-24: 09:00:00 via INTRAVENOUS

## 2015-05-24 MED ORDER — PROPOFOL 500 MG/50ML IV EMUL
INTRAVENOUS | Status: DC | PRN
  Administered 2015-05-24: 50 ug/kg/min via INTRAVENOUS

## 2015-05-24 MED ORDER — PROPOFOL 10 MG/ML IV BOLUS
INTRAVENOUS | Status: DC | PRN
  Administered 2015-05-24: 30 mg via INTRAVENOUS
  Administered 2015-05-24: 40 mg via INTRAVENOUS

## 2015-05-24 NOTE — Op Note (Signed)
[facility] Gastroenterology Patient Name: Robyn Butler Procedure Date: 05/24/2015 9:06 AM MRN: [id number] Account #: [id number] Date of Birth: Aug 29, 1928 Admit Type: Outpatient Age: 81 Room: [facility] ENDO ROOM 4 Gender: Female Note Status: Finalized Procedure:         Upper GI endoscopy Indications:       Dysphagia Providers:         Lupita Dawn. Candace Cruise, MD Referring MD:      Kirstie Peri. Caryn Section, MD (Referring MD) Medicines:         Monitored Anesthesia Care Complications:     No immediate complications. Procedure:         Pre-Anesthesia Assessment:                    - Prior to the procedure, a History and Physical was                     performed, and patient medications, allergies and                     sensitivities were reviewed. The patient's tolerance of                     previous anesthesia was reviewed.                    - The risks and benefits of the procedure and the sedation                     options and risks were discussed with the patient. All                     questions were answered and informed consent was obtained.                    - After reviewing the risks and benefits, the patient was                     deemed in satisfactory condition to undergo the procedure.                    After obtaining informed consent, the endoscope was passed                     under direct vision. Throughout the procedure, the                     patient's blood pressure, pulse, and oxygen saturations                     were monitored continuously. The Endoscope was introduced                     through the mouth, and advanced to the second part of                     duodenum. The upper GI endoscopy was accomplished without                     difficulty. The patient tolerated the procedure well. Findings:      A benign-appearing, intrinsic mild stenosis was found. The scope was       withdrawn. Dilation was performed with a Maloney dilator with mild   resistance at 66 Fr.  The entire examined stomach was normal.      The examined duodenum was normal. Impression:        - Benign-appearing esophageal stricture. Dilated.                    - Normal stomach.                    - Normal examined duodenum.                    - No specimens collected. Recommendation:    - Discharge patient to home.                    - Observe patient's clinical course.                    - The findings and recommendations were discussed with the                     patient. Procedure Code(s): --- Professional ---                    [phone], Esophagogastroduodenoscopy, flexible, transoral;                     diagnostic, including collection of specimen(s) by                     brushing or washing, when performed (separate procedure)                    43450, Dilation of esophagus, by unguided sound or bougie,                     single or multiple passes Diagnosis Code(s): --- Professional ---                    K22.2, Esophageal obstruction                    R13.10, Dysphagia, unspecified CPT copyright 2014 American Medical Association. All rights reserved. The codes documented in this report are preliminary and upon coder review may  be revised to meet current compliance requirements. Hulen Luster, MD 05/24/2015 9:19:53 AM This report has been signed electronically. Number of Addenda: 0 Note Initiated On: 05/24/2015 9:06 AM      [facility]

## 2015-05-24 NOTE — Anesthesia Postprocedure Evaluation (Signed)
  Anesthesia Post-op Note  Patient: Robyn Butler  Procedure(s) Performed: Procedure(s): COLONOSCOPY WITH PROPOFOL (N/A) ESOPHAGOGASTRODUODENOSCOPY (EGD) (N/A)  Anesthesia type:General  Patient location: PACU  Post pain: Pain level controlled  Post assessment: Post-op Vital signs reviewed, Patient's Cardiovascular Status Stable, Respiratory Function Stable, Patent Airway and No signs of Nausea or vomiting  Post vital signs: Reviewed and stable  Last Vitals:  Filed Vitals:   05/24/15 1010  BP: 153/63  Pulse: 71  Temp:   Resp: 16    Level of consciousness: awake, alert  and patient cooperative  Complications: No apparent anesthesia complications

## 2015-05-24 NOTE — Op Note (Signed)
[facility] Gastroenterology Patient Name: Robyn Butler Procedure Date: 05/24/2015 9:06 AM MRN: [id number] Account #: [id number] Date of Birth: 1928/07/19 Admit Type: Outpatient Age: 81 Room: [facility] ENDO ROOM 4 Gender: Female Note Status: Finalized Procedure:         Colonoscopy Indications:       Personal history of colonic polyps Providers:         Lupita Dawn. Candace Cruise, MD Referring MD:      Kirstie Peri. Caryn Section, MD (Referring MD) Medicines:         Monitored Anesthesia Care Complications:     No immediate complications. Procedure:         Pre-Anesthesia Assessment:                    - Prior to the procedure, a History and Physical was                     performed, and patient medications, allergies and                     sensitivities were reviewed. The patient's tolerance of                     previous anesthesia was reviewed.                    - The risks and benefits of the procedure and the sedation                     options and risks were discussed with the patient. All                     questions were answered and informed consent was obtained.                    - After reviewing the risks and benefits, the patient was                     deemed in satisfactory condition to undergo the procedure.                    After obtaining informed consent, the colonoscope was                     passed under direct vision. Throughout the procedure, the                     patient's blood pressure, pulse, and oxygen saturations                     were monitored continuously. The Olympus CF-Q160AL                     colonoscope (S#. [id number]) was introduced through the anus                     and advanced to the the cecum, identified by appendiceal                     orifice and ileocecal valve. The colonoscopy was performed                     without difficulty. The patient tolerated the procedure  well. The quality of the bowel preparation was  good. Findings:      Multiple small-mouthed diverticula were found in the sigmoid colon.      A small polyp was found in the descending colon. The polyp was sessile.       The polyp was removed with a cold snare. Resection and retrieval were       complete.      The exam was otherwise without abnormality.      There was a small lipoma, in the transverse colon. Impression:        - Diverticulosis in the sigmoid colon.                    - One small polyp in the descending colon. Resected and                     retrieved.                    - The examination was otherwise normal.                    - Small lipoma in the transverse colon. Recommendation:    - Discharge patient to home.                    - Await pathology results.                    - Repeat colonoscopy in 5 years for surveillance based on                     pathology results.                    - The findings and recommendations were discussed with the                     patient. Procedure Code(s): --- Professional ---                    [phone], Colonoscopy, flexible; with removal of tumor(s),                     polyp(s), or other lesion(s) by snare technique Diagnosis Code(s): --- Professional ---                    D12.4, Benign neoplasm of descending colon                    D17.5, Benign lipomatous neoplasm of intra-abdominal organs                    Z86.010, Personal history of colonic polyps                    K57.30, Diverticulosis of large intestine without                     perforation or abscess without bleeding CPT copyright 2014 American Medical Association. All rights reserved. The codes documented in this report are preliminary and upon coder review may  be revised to meet current compliance requirements. Hulen Luster, MD 05/24/2015 9:38:40 AM This report has been signed electronically. Number of Addenda: 0 Note Initiated On: 05/24/2015 9:06 AM Scope Withdrawal Time: 0 hours 5 minutes 35 seconds   Total Procedure Duration: 0 hours 12 minutes  [facility]

## 2015-05-24 NOTE — Transfer of Care (Signed)
Immediate Anesthesia Transfer of Care Note  Patient: Robyn Butler  Procedure(s) Performed: Procedure(s): COLONOSCOPY WITH PROPOFOL (N/A) ESOPHAGOGASTRODUODENOSCOPY (EGD) (N/A)  Patient Location: PACU  Anesthesia Type:General  Level of Consciousness: awake  Airway & Oxygen Therapy: Patient Spontanous Breathing and Patient connected to nasal cannula oxygen  Post-op Assessment: Report given to RN  Post vital signs: Reviewed and stable  Last Vitals:  Filed Vitals:   05/24/15 0833  BP: 160/53  Pulse: 75  Temp: 36.8 C  Resp: 17    Complications: No apparent anesthesia complications

## 2015-05-24 NOTE — Anesthesia Preprocedure Evaluation (Signed)
Anesthesia Evaluation  Patient identified by MRN, date of birth, ID band Patient awake    Reviewed: Allergy & Precautions, H&P , NPO status , Patient's Chart, lab work & pertinent test results, reviewed documented beta blocker date and time   History of Anesthesia Complications Negative for: history of anesthetic complications  Airway Mallampati: III  TM Distance: >3 FB Neck ROM: full    Dental  (+) Poor Dentition, Caps   Pulmonary shortness of breath and with exertion, asthma , neg sleep apnea, COPD,  COPD inhaler, neg recent URI, former smoker,    Pulmonary exam normal breath sounds clear to auscultation       Cardiovascular Exercise Tolerance: Good hypertension, On Medications (-) angina+ Peripheral Vascular Disease  (-) CAD, (-) Past MI, (-) Cardiac Stents and (-) CABG Normal cardiovascular exam(-) dysrhythmias + Valvular Problems/Murmurs AI  Rhythm:regular Rate:Normal     Neuro/Psych neg Seizures  Neuromuscular disease (sciatica) negative psych ROS   GI/Hepatic Neg liver ROS, GERD  Medicated and Controlled,  Endo/Other  diabetes, Well Controlled, Oral Hypoglycemic AgentsHypothyroidism   Renal/GU CRFRenal disease  negative genitourinary   Musculoskeletal   Abdominal   Peds  Hematology negative hematology ROS (+) Blood dyscrasia, anemia ,   Anesthesia Other Findings Past Medical History:   Hypertension                                                 Asthma                                                       Thyroid disorder                                             Varicose veins of lower extremities with other*              Diabetes mellitus without complication (HCC)                 Glaucoma                                                     COPD (chronic obstructive pulmonary disease) (*              Arthritis                                                    Chronic kidney disease                                        Cancer ([place])  Reproductive/Obstetrics negative OB ROS                             Anesthesia Physical Anesthesia Plan  ASA: III  Anesthesia Plan: General   Post-op Pain Management:    Induction:   Airway Management Planned:   Additional Equipment:   Intra-op Plan:   Post-operative Plan:   Informed Consent: I have reviewed the patients History and Physical, chart, labs and discussed the procedure including the risks, benefits and alternatives for the proposed anesthesia with the patient or authorized representative who has indicated his/her understanding and acceptance.   Dental Advisory Given  Plan Discussed with: Anesthesiologist, CRNA and Surgeon  Anesthesia Plan Comments:         Anesthesia Quick Evaluation

## 2015-05-24 NOTE — H&P (Signed)
Primary Care Physician:  Lelon Huh, MD Primary Gastroenterologist:  Dr. Candace Cruise  Pre-Procedure History & Physical: HPI:  Robyn Butler is a 81 y.o. female is here for an EGD/colonoscopy.   Past Medical History  Diagnosis Date  . Hypertension   . Asthma   . Thyroid disorder   . Varicose veins of lower extremities with other complications   . Diabetes mellitus without complication ([place])   . Glaucoma   . COPD (chronic obstructive pulmonary disease) ([place])   . Arthritis     Past Surgical History  Procedure Laterality Date  . Thyroidectomy  1969  . Cystoscopy  1972  . Abdominal hysterectomy  1972  . Bladder suspension    . Cholecystectomy  1975  . Colonoscopy    . Salpingo oophorectmy     . Joint replacement Right 2013    hip  . Wrist fracture surgery Left     Prior to Admission medications   Medication Sig Start Date End Date Taking? Authorizing Provider  naproxen sodium (ANAPROX) 220 MG tablet Take 220 mg by mouth 2 (two) times daily with a meal.   Yes Historical Provider, MD  albuterol (PROVENTIL HFA;VENTOLIN HFA) 108 (90 BASE) MCG/ACT inhaler Inhale into the lungs every 6 (six) hours as needed for wheezing or shortness of breath.    Historical Provider, MD  amLODipine (NORVASC) 5 MG tablet Take 5 mg by mouth daily.  10/10/14   Historical Provider, MD  aspirin 81 MG tablet Take 81 mg by mouth daily.    Historical Provider, MD  docusate sodium (STOOL SOFTENER) 100 MG capsule Take 100 mg by mouth daily as needed for mild constipation.    Historical Provider, MD  dorzolamide (TRUSOPT) 2 % ophthalmic solution 1 drop 2 (two) times daily.    Historical Provider, MD  furosemide (LASIX) 20 MG tablet Take 20 mg by mouth daily.  05/22/14   Historical Provider, MD  glipiZIDE-metformin (METAGLIP) 2.5-500 MG per tablet Take 1 tablet by mouth daily.  11/24/14   Historical Provider, MD  ibandronate (BONIVA) 150 MG tablet Take 1 tablet (150 mg total) by mouth every 30 (thirty) days. Take in  the morning with a full glass of water, on an empty stomach, and do not take anything else by mouth or lie down for the next 30 min. 12/24/14   Birdie Sons, MD  ibuprofen (ADVIL,MOTRIN) 200 MG tablet Take 200 mg by mouth every 6 (six) hours as needed.    Historical Provider, MD  latanoprost (XALATAN) 0.005 % ophthalmic solution 1 drop at bedtime.    Historical Provider, MD  levothyroxine (SYNTHROID, LEVOTHROID) 100 MCG tablet Take 100 mcg by mouth daily before breakfast.  07/21/13   Historical Provider, MD  losartan-hydrochlorothiazide (HYZAAR) 100-25 MG per tablet Take 1 tablet by mouth daily.  10/28/14   Historical Provider, MD  lovastatin (ALTOPREV) 40 MG 24 hr tablet Take 40 mg by mouth at bedtime.    Historical Provider, MD  nortriptyline (PAMELOR) 25 MG capsule TAKE ONE CAPSULE AT BEDTIME 03/30/15   Birdie Sons, MD  omeprazole (PRILOSEC) 20 MG capsule Take 20 mg by mouth daily.  07/21/13   Historical Provider, MD  ONE TOUCH ULTRA TEST test strip TEST ONCE DAILY 04/18/15   Birdie Sons, MD  [place] 2.5 MCG/ACT AERS 2 PUFFS EVERY DAY 02/17/15   Birdie Sons, MD    Allergies as of 05/03/2015 - Review Complete 04/28/2015  Allergen Reaction Noted  . Alendronate  01/27/2015    Family History  Problem Relation Age of Onset  . Heart attack Father   . Asthma Father   . Kidney disease Mother   . Hypertension Mother   . Hypertension Other   . Diabetes Other   . Heart attack Other     Social History   Social History  . Marital Status: Widowed    Spouse Name: N/A  . Number of Children: 2  . Years of Education: 9th Grade   Occupational History  . Retire    Social History Main Topics  . Smoking status: Former Smoker    Quit date: 07/18/1991  . Smokeless tobacco: Never Used  . Alcohol Use: No  . Drug Use: No  . Sexual Activity: Not on file   Other Topics Concern  . Not on file   Social History Narrative    Review of Systems: See HPI, otherwise negative  ROS  Physical Exam: There were no vitals taken for this visit. General:   Alert,  pleasant and cooperative in NAD Head:  Normocephalic and atraumatic. Neck:  Supple; no masses or thyromegaly. Lungs:  Clear throughout to auscultation.    Heart:  Regular rate and rhythm. Abdomen:  Soft, nontender and nondistended. Normal bowel sounds, without guarding, and without rebound.   Neurologic:  Alert and  oriented x4;  grossly normal neurologically.  Impression/Plan: Robyn Butler is here for an colonoscopy/EGD to be performed for dysphagia, GERD, and personal hx of colon polyps Risks, benefits, limitations, and alternatives regarding colonoscopy/EGD have been reviewed with the patient.  Questions have been answered.  All parties agreeable.   Jaelle Campanile, Lupita Dawn, MD  05/24/2015, 8:19 AM

## 2015-05-25 ENCOUNTER — Encounter: Payer: Self-pay | Admitting: Gastroenterology

## 2015-05-25 LAB — SURGICAL PATHOLOGY

## 2015-05-27 ENCOUNTER — Other Ambulatory Visit: Payer: Self-pay | Admitting: *Deleted

## 2015-05-27 MED ORDER — LOSARTAN POTASSIUM-HCTZ 100-25 MG PO TABS: 1.0000 | ORAL_TABLET | Freq: Every day | ORAL | Status: DC

## 2015-06-03 ENCOUNTER — Other Ambulatory Visit: Payer: Self-pay | Admitting: *Deleted

## 2015-06-03 DIAGNOSIS — Z1231 Encounter for screening mammogram for malignant neoplasm of breast: Secondary | ICD-10-CM

## 2015-06-08 ENCOUNTER — Other Ambulatory Visit: Payer: Self-pay | Admitting: Family Medicine

## 2015-06-16 ENCOUNTER — Encounter: Payer: Self-pay | Admitting: Family Medicine

## 2015-06-16 DIAGNOSIS — Z86010 Personal history of colonic polyps: Secondary | ICD-10-CM | POA: Insufficient documentation

## 2015-07-27 ENCOUNTER — Ambulatory Visit: Admission: RE | Admit: 2015-07-27 | Payer: PPO | Source: Ambulatory Visit

## 2015-07-29 ENCOUNTER — Ambulatory Visit: Payer: PPO | Admitting: Family Medicine

## 2015-07-30 ENCOUNTER — Ambulatory Visit
Admission: RE | Admit: 2015-07-30 | Discharge: 2015-07-30 | Disposition: A | Discharge status: Home / Self Care | Payer: PPO | Source: Ambulatory Visit | Attending: General Surgery | Admitting: General Surgery

## 2015-07-30 DIAGNOSIS — Z1231 Encounter for screening mammogram for malignant neoplasm of breast: Secondary | ICD-10-CM | POA: Insufficient documentation

## 2015-08-02 ENCOUNTER — Ambulatory Visit: Payer: PPO | Admitting: General Surgery

## 2015-08-05 ENCOUNTER — Encounter: Payer: Self-pay | Admitting: Family Medicine

## 2015-08-05 ENCOUNTER — Ambulatory Visit
Admission: RE | Admit: 2015-08-05 | Discharge: 2015-08-05 | Disposition: A | Discharge status: Home / Self Care | Payer: PPO | Source: Ambulatory Visit | Attending: Family Medicine | Admitting: Family Medicine

## 2015-08-05 ENCOUNTER — Ambulatory Visit (INDEPENDENT_AMBULATORY_CARE_PROVIDER_SITE_OTHER): Payer: PPO | Admitting: Family Medicine

## 2015-08-05 VITALS — BP 140/60 | HR 78 | Temp 98.5°F | Resp 16 | Ht 62.0 in | Wt 139.0 lb

## 2015-08-05 DIAGNOSIS — M545 Low back pain: Secondary | ICD-10-CM | POA: Insufficient documentation

## 2015-08-05 DIAGNOSIS — I10 Essential (primary) hypertension: Secondary | ICD-10-CM | POA: Diagnosis not present

## 2015-08-05 DIAGNOSIS — M5136 Other intervertebral disc degeneration, lumbar region: Secondary | ICD-10-CM | POA: Diagnosis not present

## 2015-08-05 DIAGNOSIS — E118 Type 2 diabetes mellitus with unspecified complications: Secondary | ICD-10-CM

## 2015-08-05 DIAGNOSIS — E1149 Type 2 diabetes mellitus with other diabetic neurological complication: Secondary | ICD-10-CM

## 2015-08-05 LAB — POCT GLYCOSYLATED HEMOGLOBIN (HGB A1C)
Est. average glucose Bld gHb Est-mCnc: 134
HEMOGLOBIN A1C: 6.3

## 2015-08-05 NOTE — Patient Instructions (Signed)
Try OTC Lidocaine 4% patch, or OTC TENs units for back pain.

## 2015-08-05 NOTE — Progress Notes (Signed)
Patient: Robyn Butler Female    DOB: 1929-04-26   82 y.o.   MRN: [id number] Visit Date: 08/05/2015  Today's Provider: Lelon Huh, MD   Chief Complaint  Patient presents with  . Follow-up  . Diabetes  . Hypothyroidism  . Hypertension   Subjective:    Back Pain This is a chronic problem. The current episode started more than 1 year ago (4 years off and on). The problem occurs constantly. The problem has been gradually worsening since onset. The pain is present in the lumbar spine and gluteal. The quality of the pain is described as stabbing and aching. Radiates to: radiates to buttocks, from right to left and then down into groin area. The pain is at a severity of 10/10. The pain is severe. The pain is the same all the time. The symptoms are aggravated by bending, lying down, position and twisting. Associated symptoms include leg pain, numbness, pelvic pain and tingling. Pertinent negatives include no abdominal pain, bladder incontinence, bowel incontinence, chest pain, dysuria, fever, headaches, paresis, paresthesias, perianal numbness, weakness or weight loss. She has tried nothing (rest) for the symptoms. The treatment provided mild relief.     Diabetes Mellitus Type II, Follow-up:   Lab Results  Component Value Date   HGBA1C 6.2 04/28/2015   HGBA1C 5.9% 01/27/2015   Last seen for diabetes 3 months ago.  Management since then includes; no changes. She reports good compliance with treatment. She is not having side effects. Burning and stinging in feet Current symptoms include burning and stinging in feet and have been unchanged. Home blood sugar records: fasting range: >200  Episodes of hypoglycemia? no   Current Insulin Regimen: n/a Most Recent Eye Exam: 05/2015 Weight trend: stable Prior visit with dietician: no Current diet: well balanced Current exercise: housecleaning  ----------------------------------------------------------------------   Hypertension,  follow-up:  BP Readings from Last 3 Encounters:  08/05/15 140/60  05/24/15 153/63  04/28/15 140/52    She was last seen for hypertension 3 months ago.  BP at that visit was 1405/52. Management since that visit includes; no changes.She reports good compliance with treatment. She is not having side effects. none  She is exercising. She is not adherent to low salt diet.   Outside blood pressures are 150/50. She is experiencing palpitations.  Patient denies palpitations.   Cardiovascular risk factors include diabetes mellitus.  Use of agents associated with hypertension: none.   ----------------------------------------------------------------------    Lipid/Cholesterol, Follow-up:   Last seen for this 3 months ago.  Management since that visit includes; no changes.  Last Lipid Panel:    Component Value Date/Time   CHOL 96 11/10/2012 0512   TRIG 68 11/10/2012 0512   HDL 36* 11/10/2012 0512   VLDL 14 11/10/2012 0512   LDLCALC 46 11/10/2012 0512    She reports good compliance with treatment. She is not having side effects. none  Wt Readings from Last 3 Encounters:  08/05/15 139 lb (63.05 kg)  05/24/15 140 lb (63.504 kg)  04/28/15 141 lb (63.957 kg)    ----------------------------------------------------------------------     Allergies  Allergen Reactions  . Alendronate     Other reaction(s): Vomiting   Previous Medications   ALBUTEROL (PROVENTIL HFA;VENTOLIN HFA) 108 (90 BASE) MCG/ACT INHALER    Inhale into the lungs every 6 (six) hours as needed for wheezing or shortness of breath.   AMLODIPINE (NORVASC) 5 MG TABLET    Take 5 mg by mouth daily.    ASPIRIN  81 MG TABLET    Take 81 mg by mouth daily.   DOCUSATE SODIUM (STOOL SOFTENER) 100 MG CAPSULE    Take 100 mg by mouth daily as needed for mild constipation.   DORZOLAMIDE (TRUSOPT) 2 % OPHTHALMIC SOLUTION    1 drop 2 (two) times daily.   FUROSEMIDE (LASIX) 20 MG TABLET    TAKE ONE (1) TABLET EACH DAY FOR  SWELLING   GLIPIZIDE-METFORMIN (METAGLIP) 2.5-500 MG PER TABLET    Take 1 tablet by mouth daily.    IBANDRONATE (BONIVA) 150 MG TABLET    Take 1 tablet (150 mg total) by mouth every 30 (thirty) days. Take in the morning with a full glass of water, on an empty stomach, and do not take anything else by mouth or lie down for the next 30 min.   IBUPROFEN (ADVIL,MOTRIN) 200 MG TABLET    Take 200 mg by mouth every 6 (six) hours as needed.   LATANOPROST (XALATAN) 0.005 % OPHTHALMIC SOLUTION    1 drop at bedtime.   LEVOTHYROXINE (SYNTHROID, LEVOTHROID) 100 MCG TABLET    Take 100 mcg by mouth daily before breakfast.    LOSARTAN-HYDROCHLOROTHIAZIDE (HYZAAR) 100-25 MG TABLET    Take 1 tablet by mouth daily.   LOVASTATIN (ALTOPREV) 40 MG 24 HR TABLET    Take 40 mg by mouth at bedtime.   NAPROXEN SODIUM (ANAPROX) 220 MG TABLET    Take 220 mg by mouth 2 (two) times daily with a meal.   NORTRIPTYLINE (PAMELOR) 25 MG CAPSULE    TAKE ONE CAPSULE AT BEDTIME   OMEPRAZOLE (PRILOSEC) 20 MG CAPSULE    Take 20 mg by mouth daily.    ONE TOUCH ULTRA TEST TEST STRIP    TEST ONCE DAILY   SPIRIVA RESPIMAT 2.5 MCG/ACT AERS    2 PUFFS EVERY DAY    Review of Systems  Constitutional: Negative for fever, chills, weight loss, appetite change and fatigue.  Respiratory: Negative for chest tightness and shortness of breath.   Cardiovascular: Negative for chest pain and palpitations.  Gastrointestinal: Negative for nausea, vomiting, abdominal pain and bowel incontinence.  Genitourinary: Positive for pelvic pain. Negative for bladder incontinence and dysuria.  Musculoskeletal: Positive for back pain.  Neurological: Positive for tingling and numbness. Negative for dizziness, weakness, headaches and paresthesias.    Social History  Substance Use Topics  . Smoking status: Former Smoker    Quit date: 07/18/1991  . Smokeless tobacco: Never Used  . Alcohol Use: No   Objective:   BP 140/60 mmHg  Pulse 78  Temp(Src) 98.5 F  (36.9 C) (Oral)  Resp 16  Ht 5\' 2"  (1.575 m)  Wt 139 lb (63.05 kg)  BMI 25.42 kg/m2  SpO2 96%  Physical Exam    General Appearance:    Alert, cooperative, no distress  Eyes:    PERRL, conjunctiva/corneas clear, EOM's intact       Lungs:     Clear to auscultation bilaterally, respirations unlabored  Heart:    Regular rate and rhythm  Neurologic:   Awake, alert, oriented x 3. No apparent focal neurological           defect.   MS:    Tender right paralumbar muscles.      Results for orders placed or performed in visit on 08/05/15  POCT glycosylated hemoglobin (Hb A1C)  Result Value Ref Range   Hemoglobin A1C 6.3    Est. average glucose Bld gHb Est-mCnc 134  Assessment & Plan:     1. Type 2 di2abetes mellitus with neuropathy, without long-term current use of insulin (HCC) Home sugars are labile, but A1c is at target and she is having no hypoglycemia. Continue current medications.     2. Other diabetic neurological complication associated with type 2 diabetes mellitus ([place]) - POCT glycosylated hemoglobin (Hb A1C)  3. Essential (primary) hypertension Well controlled.  Continue current medications.    4. Midline low back pain, with sciatica presence unspecified Patient Instructions  Try OTC Lidocaine 4% patch, or OTC TENs units for back pain.    - DG Lumbar Spine Complete; Future       Lelon Huh, MD  [facility]

## 2015-08-06 ENCOUNTER — Telehealth: Payer: Self-pay

## 2015-08-06 MED ORDER — TRAMADOL HCL 50 MG PO TABS: 50.0000 mg | ORAL_TABLET | Freq: Three times a day (TID) | ORAL | Status: DC | PRN

## 2015-08-06 NOTE — Telephone Encounter (Signed)
Patient advised as directed below. Patient verbalized understanding. RX called in to pharmacy. Patient does not want to proceed with PT. Patient states she has done PT in the past and it did not help.

## 2015-08-06 NOTE — Telephone Encounter (Signed)
-----   Message from Birdie Sons, MD sent at 08/06/2015  7:46 AM EST ----- Xrays show advanced arthritis in back. Can try tramadol 50mg  one every eight hours as needed for pain. #30, rf x 1. Also recommend referral to physical therapy for recurrent back pain.

## 2015-08-06 NOTE — Telephone Encounter (Signed)
LMTCB

## 2015-08-10 ENCOUNTER — Encounter: Payer: Self-pay | Admitting: General Surgery

## 2015-08-10 ENCOUNTER — Ambulatory Visit (INDEPENDENT_AMBULATORY_CARE_PROVIDER_SITE_OTHER): Payer: PPO | Admitting: General Surgery

## 2015-08-10 VITALS — BP 130/66 | HR 74 | Resp 12 | Ht 62.0 in | Wt 138.0 lb

## 2015-08-10 DIAGNOSIS — Z1239 Encounter for other screening for malignant neoplasm of breast: Secondary | ICD-10-CM | POA: Diagnosis not present

## 2015-08-10 NOTE — Patient Instructions (Signed)
Patient will be asked to return to the office in one year with a bilateral screening mammogram.  Continue self breast exams. Call office for any new breast issues or concerns.  

## 2015-08-10 NOTE — Progress Notes (Signed)
Patient ID: Robyn Butler, female   DOB: 1928-11-27, 82 y.o.   MRN: [id number]  Chief Complaint  Patient presents with  . Follow-up    mammogram    HPI Robyn Butler is a 82 y.o. female who presents for a breast evaluation. The most recent mammogram was done on 07/30/15.  Patient does perform regular self breast checks and gets regular mammograms done.  No new healthy issues. I have reviewed the history of present illness with the patient.  HPI  Past Medical History  Diagnosis Date  . Hypertension   . Asthma   . Thyroid disorder   . Varicose veins of lower extremities with other complications   . Diabetes mellitus without complication ([place])   . Glaucoma   . COPD (chronic obstructive pulmonary disease) ([place])   . Arthritis   . Chronic kidney disease   . Cancer [facility])     Past Surgical History  Procedure Laterality Date  . Thyroidectomy  1969  . Cystoscopy  1972  . Abdominal hysterectomy  1972  . Bladder suspension    . Cholecystectomy  1975  . Colonoscopy    . Salpingo oophorectmy     . Joint replacement Right 2013    hip  . Wrist fracture surgery Left   . Fracture surgery    . Colonoscopy with propofol N/A 05/24/2015    Procedure: COLONOSCOPY WITH PROPOFOL;  Surgeon: Hulen Luster, MD;  Location: [facility] ENDOSCOPY;  Service: Gastroenterology;  Laterality: N/A;  . Esophagogastroduodenoscopy N/A 05/24/2015    Procedure: ESOPHAGOGASTRODUODENOSCOPY (EGD);  Surgeon: Hulen Luster, MD;  Location[facility] ENDOSCOPY;  Service: Gastroenterology;  Laterality: N/A;  . Breast excisional biopsy Right 1999    neg    Family History  Problem Relation Age of Onset  . Heart attack Father   . Asthma Father   . Kidney disease Mother   . Hypertension Mother   . Hypertension Other   . Diabetes Other   . Heart attack Other     Social History Social History  Substance Use Topics  . Smoking status: Former Smoker    Quit date: 07/18/1991  . Smokeless tobacco: Never Used  . Alcohol Use: No     Allergies  Allergen Reactions  . Alendronate     Other reaction(s): Vomiting    Current Outpatient Prescriptions  Medication Sig Dispense Refill  . albuterol (PROVENTIL HFA;VENTOLIN HFA) 108 (90 BASE) MCG/ACT inhaler Inhale into the lungs every 6 (six) hours as needed for wheezing or shortness of breath.    [place] Kitchen amLODipine (NORVASC) 5 MG tablet Take 5 mg by mouth daily.     Marland Kitchen aspirin 81 MG tablet Take 81 mg by mouth daily.    Marland Kitchen docusate sodium (STOOL SOFTENER) 100 MG capsule Take 100 mg by mouth daily as needed for mild constipation.    . dorzolamide (TRUSOPT) 2 % ophthalmic solution 1 drop 2 (two) times daily.    . furosemide (LASIX) 20 MG tablet TAKE ONE (1) TABLET EACH DAY FOR SWELLING 30 tablet 6  . glipiZIDE-metformin (METAGLIP) 2.5-500 MG per tablet Take 1 tablet by mouth daily.     Marland Kitchen ibandronate (BONIVA) 150 MG tablet Take 1 tablet (150 mg total) by mouth every 30 (thirty) days. Take in the morning with a full glass of water, on an empty stomach, and do not take anything else by mouth or lie down for the next 30 min. 1 tablet 12  . ibuprofen (ADVIL,MOTRIN) 200 MG tablet Take 200 mg  by mouth every 6 (six) hours as needed.    . latanoprost (XALATAN) 0.005 % ophthalmic solution 1 drop at bedtime.    Marland Kitchen levothyroxine (SYNTHROID, LEVOTHROID) 100 MCG tablet Take 100 mcg by mouth daily before breakfast.     . losartan-hydrochlorothiazide (HYZAAR) 100-25 MG tablet Take 1 tablet by mouth daily. 30 tablet 12  . lovastatin (ALTOPREV) 40 MG 24 hr tablet Take 40 mg by mouth at bedtime.    . naproxen sodium (ANAPROX) 220 MG tablet Take 220 mg by mouth 2 (two) times daily with a meal.    . nortriptyline (PAMELOR) 25 MG capsule TAKE ONE CAPSULE AT BEDTIME 30 capsule 5  . omeprazole (PRILOSEC) 20 MG capsule Take 20 mg by mouth daily.     . ONE TOUCH ULTRA TEST test strip TEST ONCE DAILY 100 each 4  . SPIRIVA RESPIMAT 2.5 MCG/ACT AERS 2 PUFFS EVERY DAY 4 g 6   No current facility-administered  medications for this visit.    Review of Systems Review of Systems  Constitutional: Negative.   Respiratory: Negative.   Cardiovascular: Negative.     Blood pressure 130/66, pulse 74, resp. rate 12, height 5\' 2"  (1.575 m), weight 138 lb (62.596 kg).  Physical Exam Physical Exam  Constitutional: She is oriented to person, place, and time. She appears well-nourished.  Eyes: Conjunctivae are normal. No scleral icterus.  Neck: Neck supple.  Cardiovascular: Normal rate, regular rhythm and normal heart sounds.   Pulmonary/Chest: Effort normal and breath sounds normal. Right breast exhibits no inverted nipple, no mass, no nipple discharge, no skin change and no tenderness. Left breast exhibits no inverted nipple, no mass, no nipple discharge, no skin change and no tenderness.  Abdominal: Soft. Bowel sounds are normal. There is no hepatomegaly. There is no tenderness. No hernia.  Lymphadenopathy:    She has no cervical adenopathy.    She has no axillary adenopathy.  Neurological: She is alert and oriented to person, place, and time.  Skin: Skin is warm and dry.    Data Reviewed Mammogram reviewed-stable  Assessment    Stable exam, FCD- no new issues    Plan        Patient will be asked to return to the office in one year with a bilateral screening mammogram.  PCP:  Birdie Sons This information has been scribed by Gaspar Cola CMA.   Nasario Czerniak G 08/10/2015, 1:44 PM

## 2015-09-06 DIAGNOSIS — D3132 Benign neoplasm of left choroid: Secondary | ICD-10-CM | POA: Diagnosis not present

## 2015-09-06 LAB — HM DIABETES EYE EXAM

## 2015-09-08 ENCOUNTER — Encounter: Payer: Self-pay | Admitting: *Deleted

## 2015-09-22 ENCOUNTER — Other Ambulatory Visit: Payer: Self-pay | Admitting: Family Medicine

## 2015-09-30 ENCOUNTER — Other Ambulatory Visit: Payer: Self-pay | Admitting: Family Medicine

## 2015-10-04 ENCOUNTER — Other Ambulatory Visit: Payer: Self-pay | Admitting: Family Medicine

## 2015-10-12 ENCOUNTER — Other Ambulatory Visit: Payer: Self-pay | Admitting: Family Medicine

## 2015-12-02 ENCOUNTER — Encounter: Payer: Self-pay | Admitting: Family Medicine

## 2015-12-02 ENCOUNTER — Ambulatory Visit (INDEPENDENT_AMBULATORY_CARE_PROVIDER_SITE_OTHER): Payer: PPO | Admitting: Family Medicine

## 2015-12-02 VITALS — BP 130/52 | HR 79 | Temp 98.2°F | Resp 16 | Wt 138.0 lb

## 2015-12-02 DIAGNOSIS — E039 Hypothyroidism, unspecified: Secondary | ICD-10-CM

## 2015-12-02 DIAGNOSIS — E785 Hyperlipidemia, unspecified: Secondary | ICD-10-CM | POA: Diagnosis not present

## 2015-12-02 DIAGNOSIS — R194 Change in bowel habit: Secondary | ICD-10-CM

## 2015-12-02 DIAGNOSIS — E1149 Type 2 diabetes mellitus with other diabetic neurological complication: Secondary | ICD-10-CM | POA: Diagnosis not present

## 2015-12-02 DIAGNOSIS — I10 Essential (primary) hypertension: Secondary | ICD-10-CM

## 2015-12-02 DIAGNOSIS — E119 Type 2 diabetes mellitus without complications: Secondary | ICD-10-CM | POA: Diagnosis not present

## 2015-12-02 LAB — POCT GLYCOSYLATED HEMOGLOBIN (HGB A1C)
Est. average glucose Bld gHb Est-mCnc: 134
HEMOGLOBIN A1C: 6.3

## 2015-12-02 NOTE — Progress Notes (Signed)
Patient: Robyn Butler Female    DOB: August 15, 1928   82 y.o.   MRN: [id number] Visit Date: 12/02/2015  Today's Provider: Lelon Huh, MD   Chief Complaint  Patient presents with  . Hypertension    follow up  . Diabetes    follow up  . Hypothyroidism    follow up   Subjective:    HPI  Hypertension, follow-up:  BP Readings from Last 3 Encounters:  12/02/15 130/52  08/10/15 130/66  08/05/15 140/60    She was last seen for hypertension 4 months ago.  BP at that visit was 140/60. Management since that visit includes no changes. She reports good compliance with treatment. She is not having side effects.  She is not exercising. She is not adherent to low salt diet.   Outside blood pressures are 125/55. She is experiencing chest pain and lower extremity edema.  Patient denies chest pressure/discomfort, irregular heart beat, near-syncope, palpitations, paroxysmal nocturnal dyspnea and syncope.   Cardiovascular risk factors include advanced age (older than 59 for men, 19 for women), diabetes mellitus and hypertension.  Use of agents associated with hypertension: NSAIDS.     Weight trend: stable Wt Readings from Last 3 Encounters:  12/02/15 138 lb (62.596 kg)  08/10/15 138 lb (62.596 kg)  08/05/15 139 lb (63.05 kg)    Current diet: in general, an "unhealthy" diet  ------------------------------------------------------------------------   Diabetes Mellitus Type II, Follow-up:   Lab Results  Component Value Date   HGBA1C 6.3 08/05/2015   HGBA1C 6.2 04/28/2015   HGBA1C 5.9% 01/27/2015    Last seen for diabetes 4 months ago.  Management since then includes no changes. She reports good compliance with treatment. She is not having side effects.  Current symptoms include visual disturbances and have been stable. Home blood sugar records: fasting range: 180-200  Episodes of hypoglycemia? no   Current Insulin Regimen: none Most Recent Eye Exam: <1  year Weight trend: stable Prior visit with dietician: no Current diet: in general, an "unhealthy" diet Current exercise: none  Pertinent Labs:    Component Value Date/Time   CHOL 96 11/10/2012 0512   TRIG 68 11/10/2012 0512   HDL 36* 11/10/2012 0512   LDLCALC 46 11/10/2012 0512   CREATININE 1.40* 04/28/2015 1233   CREATININE 1.10 11/10/2012 0512    Wt Readings from Last 3 Encounters:  12/02/15 138 lb (62.596 kg)  08/10/15 138 lb (62.596 kg)  08/05/15 139 lb (63.05 kg)    ------------------------------------------------------------------------  Follow up Hypothyroidism:  Last office visit was 7 months ago and no changes were made. Patient reports good compliance with treatment and good tolerance.   She is also reports that she has change in bowels for several months, alternating between diarrhea and constipation.     Allergies  Allergen Reactions  . Alendronate     Other reaction(s): Vomiting   Previous Medications   ALBUTEROL (PROVENTIL HFA;VENTOLIN HFA) 108 (90 BASE) MCG/ACT INHALER    Inhale into the lungs every 6 (six) hours as needed for wheezing or shortness of breath.   AMLODIPINE (NORVASC) 5 MG TABLET    TAKE ONE (1) TABLET BY MOUTH EVERY DAY   ASPIRIN 81 MG TABLET    Take 81 mg by mouth daily.   DIPHENHYDRAMINE-APAP, SLEEP, (TYLENOL PM EXTRA STRENGTH PO)    Take 1 tablet by mouth at bedtime as needed.   DORZOLAMIDE (TRUSOPT) 2 % OPHTHALMIC SOLUTION    1 drop 2 (two) times daily.  FUROSEMIDE (LASIX) 20 MG TABLET    TAKE ONE (1) TABLET EACH DAY FOR SWELLING   GLIPIZIDE-METFORMIN (METAGLIP) 2.5-500 MG TABLET    TAKE ONE TABLET BY MOUTH EVERY MORNING   IBANDRONATE (BONIVA) 150 MG TABLET    Take 1 tablet (150 mg total) by mouth every 30 (thirty) days. Take in the morning with a full glass of water, on an empty stomach, and do not take anything else by mouth or lie down for the next 30 min.   LATANOPROST (XALATAN) 0.005 % OPHTHALMIC SOLUTION    1 drop at bedtime.    LEVOTHYROXINE (SYNTHROID, LEVOTHROID) 100 MCG TABLET    TAKE ONE (1) TABLET BY MOUTH EVERY DAY   LOSARTAN-HYDROCHLOROTHIAZIDE (HYZAAR) 100-25 MG TABLET    Take 1 tablet by mouth daily.   LOVASTATIN (ALTOPREV) 40 MG 24 HR TABLET    Take 40 mg by mouth at bedtime.   NORTRIPTYLINE (PAMELOR) 25 MG CAPSULE    TAKE ONE CAPSULE AT BEDTIME   OMEPRAZOLE (PRILOSEC) 20 MG CAPSULE    Take 20 mg by mouth daily.    ONE TOUCH ULTRA TEST TEST STRIP    TEST ONCE DAILY   SPIRIVA RESPIMAT 2.5 MCG/ACT AERS    2 PUFFS EVERY DAY   TRAMADOL (ULTRAM) 50 MG TABLET    Take 50 mg by mouth every 6 (six) hours as needed.    Review of Systems  Constitutional: Negative for fever, chills, appetite change and fatigue.  Eyes: Positive for visual disturbance.  Respiratory: Negative for chest tightness and shortness of breath.   Cardiovascular: Positive for chest pain and leg swelling (in ankles). Negative for palpitations.  Gastrointestinal: Negative for nausea, vomiting and abdominal pain.  Endocrine: Negative for cold intolerance, heat intolerance, polydipsia, polyphagia and polyuria.  Musculoskeletal: Positive for arthralgias.  Neurological: Negative for dizziness and weakness.    Social History  Substance Use Topics  . Smoking status: Former Smoker    Quit date: 07/18/1991  . Smokeless tobacco: Never Used  . Alcohol Use: No   Objective:   BP 130/52 mmHg  Pulse 79  Temp(Src) 98.2 F (36.8 C) (Oral)  Resp 16  Wt 138 lb (62.596 kg)  SpO2 97%  Physical Exam   General Appearance:    Alert, cooperative, no distress  Eyes:    PERRL, conjunctiva/corneas clear, EOM's intact       Lungs:     Clear to auscultation bilaterally, respirations unlabored  Heart:    Regular rate and rhythm  Neurologic:   Awake, alert, oriented x 3. No apparent focal neurological           defect.       Results for orders placed or performed in visit on 12/02/15  POCT HgB A1C  Result Value Ref Range   Hemoglobin A1C 6.3    Est.  average glucose Bld gHb Est-mCnc 134        Assessment & Plan:     1. Controlled type 2 diabetes mellitus without complication, without long-term current use of insulin ([place]) Doing well. Continue current medications.   - POCT HgB A1C - Renal function panel  2. Hypothyroidism, unspecified hypothyroidism type  - T4 AND TSH  3. Other diabetic neurological complication associated with type 2 diabetes mellitus ([place])   4. Essential (primary) hypertension Well controlled.  Continue current medications.   - Renal function panel  5. HLD (hyperlipidemia) She is tolerating lovastatin well with no adverse effects.   - Lipid panel - Hepatic function  panel  6. Hypomagnesemia  - Magnesium  7. Bowel habit changes Had colonoscopy in November. Counseled to start taking Metamucil every day.      The entirety of the information documented in the History of Present Illness, Review of Systems and Physical Exam were personally obtained by me. Portions of this information were initially documented by Meyer Cory, CMA and reviewed by me for thoroughness and accuracy.    Lelon Huh, MD  [facility]

## 2015-12-03 LAB — RENAL FUNCTION PANEL
Albumin: 4.7 g/dL (ref 3.5–4.7)
BUN/Creatinine Ratio: 17 (ref 12–28)
BUN: 22 mg/dL (ref 8–27)
CO2: 27 mmol/L (ref 18–29)
CREATININE: 1.31 mg/dL — AB (ref 0.57–1.00)
Calcium: 9.5 mg/dL (ref 8.7–10.3)
Chloride: 98 mmol/L (ref 96–106)
GFR, EST AFRICAN AMERICAN: 43 mL/min/{1.73_m2} — AB (ref >59)
GFR, EST NON AFRICAN AMERICAN: 37 mL/min/{1.73_m2} — AB (ref >59)
GLUCOSE: 97 mg/dL (ref 65–99)
PHOSPHORUS: 3.3 mg/dL (ref 2.5–4.5)
Potassium: 4.3 mmol/L (ref 3.5–5.2)
SODIUM: 142 mmol/L (ref 134–144)

## 2015-12-03 LAB — HEPATIC FUNCTION PANEL
ALK PHOS: 73 IU/L (ref 39–117)
ALT: 7 IU/L (ref 0–32)
AST: 12 IU/L (ref 0–40)
BILIRUBIN, DIRECT: 0.12 mg/dL (ref 0.00–0.40)
Bilirubin Total: 0.4 mg/dL (ref 0.0–1.2)
TOTAL PROTEIN: 7.9 g/dL (ref 6.0–8.5)

## 2015-12-03 LAB — LIPID PANEL
CHOL/HDL RATIO: 3.3 ratio (ref 0.0–4.4)
Cholesterol, Total: 139 mg/dL (ref 100–199)
HDL: 42 mg/dL (ref >39)
LDL CALC: 69 mg/dL (ref 0–99)
TRIGLYCERIDES: 138 mg/dL (ref 0–149)
VLDL CHOLESTEROL CAL: 28 mg/dL (ref 5–40)

## 2015-12-03 LAB — MAGNESIUM: MAGNESIUM: 1.7 mg/dL (ref 1.6–2.3)

## 2015-12-03 LAB — T4 AND TSH
T4 TOTAL: 9.1 ug/dL (ref 4.5–12.0)
TSH: 1.29 u[IU]/mL (ref 0.450–4.500)

## 2015-12-14 DIAGNOSIS — H401131 Primary open-angle glaucoma, bilateral, mild stage: Secondary | ICD-10-CM | POA: Diagnosis not present

## 2016-01-03 ENCOUNTER — Other Ambulatory Visit: Payer: Self-pay | Admitting: Family Medicine

## 2016-01-27 ENCOUNTER — Other Ambulatory Visit: Payer: Self-pay | Admitting: Family Medicine

## 2016-02-01 ENCOUNTER — Other Ambulatory Visit: Payer: Self-pay | Admitting: Family Medicine

## 2016-02-01 DIAGNOSIS — E1149 Type 2 diabetes mellitus with other diabetic neurological complication: Secondary | ICD-10-CM

## 2016-02-02 ENCOUNTER — Telehealth: Payer: Self-pay | Admitting: Family Medicine

## 2016-02-02 ENCOUNTER — Other Ambulatory Visit: Payer: Self-pay | Admitting: Family Medicine

## 2016-02-02 MED ORDER — GLUCOSE BLOOD VI STRP: ORAL_STRIP | Status: DC

## 2016-02-02 NOTE — Telephone Encounter (Signed)
Patient needs refill of strips

## 2016-02-28 ENCOUNTER — Other Ambulatory Visit: Payer: Self-pay | Admitting: Family Medicine

## 2016-03-02 ENCOUNTER — Encounter: Payer: Self-pay | Admitting: Family Medicine

## 2016-03-02 ENCOUNTER — Ambulatory Visit (INDEPENDENT_AMBULATORY_CARE_PROVIDER_SITE_OTHER): Payer: PPO | Admitting: Family Medicine

## 2016-03-02 VITALS — BP 120/56 | HR 72 | Temp 98.1°F | Resp 16 | Ht 62.0 in | Wt 136.0 lb

## 2016-03-02 DIAGNOSIS — I10 Essential (primary) hypertension: Secondary | ICD-10-CM

## 2016-03-02 DIAGNOSIS — E785 Hyperlipidemia, unspecified: Secondary | ICD-10-CM

## 2016-03-02 DIAGNOSIS — E119 Type 2 diabetes mellitus without complications: Secondary | ICD-10-CM | POA: Diagnosis not present

## 2016-03-02 LAB — POCT GLYCOSYLATED HEMOGLOBIN (HGB A1C)
Est. average glucose Bld gHb Est-mCnc: 131
Hemoglobin A1C: 6.2

## 2016-03-02 NOTE — Progress Notes (Signed)
Patient: Robyn Butler Female    DOB: 1928-09-28   82 y.o.   MRN: [id number] Visit Date: 03/02/2016  Today's Provider: Lelon Huh, MD   Chief Complaint  Patient presents with  . Diabetes  . Hypertension  . Hypothyroidism   Subjective:    HPI  Diabetes Mellitus Type II, Follow-up:   Lab Results  Component Value Date   HGBA1C 6.3 12/02/2015   HGBA1C 6.3 08/05/2015   HGBA1C 6.2 04/28/2015    Last seen for diabetes 3 months ago.  Management since then includes no changes. She reports excellent compliance with treatment. She is not having side effects. Current symptoms include none and have been stable. Home blood sugar records: fasting range: 181 this morning  Episodes of hypoglycemia? no   Current Insulin Regimen: none Most Recent Eye Exam: up to date Weight trend: stable Prior visit with dietician: no Current diet: in general, a "healthy" diet   Current exercise: housecleaning and walking  Pertinent Labs:    Component Value Date/Time   CHOL 139 12/02/2015 1151   CHOL 96 11/10/2012 0512   TRIG 138 12/02/2015 1151   TRIG 68 11/10/2012 0512   HDL 42 12/02/2015 1151   HDL 36 (L) 11/10/2012 0512   LDLCALC 69 12/02/2015 1151   LDLCALC 46 11/10/2012 0512   CREATININE 1.31 (H) 12/02/2015 1151   CREATININE 1.10 11/10/2012 0512    Wt Readings from Last 3 Encounters:  03/02/16 136 lb (61.7 kg)  12/02/15 138 lb (62.6 kg)  08/10/15 138 lb (62.6 kg)    ------------------------------------------------------------------------   Hypertension, follow-up:  BP Readings from Last 3 Encounters:  03/02/16 (!) 120/56  12/02/15 (!) 130/52  08/10/15 130/66    She was last seen for hypertension 3 months ago.  BP at that visit was 130/52. Management changes since that visit include no changes. She reports excellent compliance with treatment. She is not having side effects.  She is exercising. She is adherent to low salt diet.   Outside blood pressures are  stable. She is experiencing none.  Patient denies chest pain.   Cardiovascular risk factors include none.  Use of agents associated with hypertension: none.     Weight trend: stable Wt Readings from Last 3 Encounters:  03/02/16 136 lb (61.7 kg)  12/02/15 138 lb (62.6 kg)  08/10/15 138 lb (62.6 kg)    Current diet: in general, a "healthy" diet    ------------------------------------------------------------------------  Hypothyroid, follow-up:  TSH  Date Value Ref Range Status  12/02/2015 1.290 0.450 - 4.500 uIU/mL Final  04/28/2015 2.210 0.450 - 4.500 uIU/mL Final   Thyroid Stimulating Horm  Date Value Ref Range Status  08/02/2011 0.695 uIU/mL Final    Comment:    0.45-4.50 (International Unit)  ----------------------- Pregnant patients have  different reference  ranges for TSH:  - - - - - - - - - -  Pregnant, first trimetser:  0.36 - 2.50 uIU/mL    Wt Readings from Last 3 Encounters:  03/02/16 136 lb (61.7 kg)  12/02/15 138 lb (62.6 kg)  08/10/15 138 lb (62.6 kg)    She was last seen for hypothyroid 3 months ago.  Management since that visit includes no changes. She reports excellent compliance with treatment. She is not having side effects.  She is exercising. She is experiencing none She denies palpitations Weight trend: stable  ------------------------------------------------------------------------      Allergies  Allergen Reactions  . Alendronate     Other reaction(s):  Vomiting   Current Meds  Medication Sig  . albuterol (PROVENTIL HFA;VENTOLIN HFA) 108 (90 BASE) MCG/ACT inhaler Inhale into the lungs every 6 (six) hours as needed for wheezing or shortness of breath.  Marland Kitchen amLODipine (NORVASC) 5 MG tablet TAKE ONE (1) TABLET BY MOUTH EVERY DAY  . aspirin 81 MG tablet Take 81 mg by mouth daily.  . Diphenhydramine-APAP, sleep, (TYLENOL PM EXTRA STRENGTH PO) Take 1 tablet by mouth at bedtime as needed.  . dorzolamide (TRUSOPT) 2 % ophthalmic  solution 1 drop 2 (two) times daily.  . furosemide (LASIX) 20 MG tablet TAKE ONE (1) TABLET BY MOUTH EVERY DAY FOR SWELLING  . glipiZIDE-metformin (METAGLIP) 2.5-500 MG tablet TAKE ONE TABLET BY MOUTH EVERY MORNING  . glucose blood (ONE TOUCH ULTRA TEST) test strip TEST TWICE DAILY  . ibandronate (BONIVA) 150 MG tablet TAKE ONE TABLET ONCE A MONTH FIRST THINGIN THE MORNING AT LEAST 1 HOUR BEFORE EATING, TAKE WITH WATER. DON'T LIE DOWN FOR 30 MINUTES.  Marland Kitchen latanoprost (XALATAN) 0.005 % ophthalmic solution 1 drop at bedtime.  Marland Kitchen levothyroxine (SYNTHROID, LEVOTHROID) 100 MCG tablet TAKE ONE (1) TABLET BY MOUTH EVERY DAY  . losartan-hydrochlorothiazide (HYZAAR) 100-25 MG tablet Take 1 tablet by mouth daily.  Marland Kitchen lovastatin (MEVACOR) 40 MG tablet TAKE ONE TABLET BY MOUTH EVERY NIGHT AT BEDTIME  . nortriptyline (PAMELOR) 25 MG capsule TAKE ONE CAPSULE AT BEDTIME  . omeprazole (PRILOSEC) 20 MG capsule Take 20 mg by mouth daily.   Marland Kitchen SPIRIVA RESPIMAT 2.5 MCG/ACT AERS 2 PUFFS EVERY DAY    Review of Systems  Constitutional: Negative.   Endocrine: Negative.   Musculoskeletal: Positive for arthralgias, back pain and gait problem.    Social History  Substance Use Topics  . Smoking status: Former Smoker    Quit date: 07/18/1991  . Smokeless tobacco: Never Used  . Alcohol use No   Objective:   BP (!) 120/56 (BP Location: Right Arm, Patient Position: Sitting, Cuff Size: Normal)   Pulse 72   Temp 98.1 F (36.7 C) (Oral)   Resp 16   Ht 5\' 2"  (1.575 m)   Wt 136 lb (61.7 kg)   SpO2 97%   BMI 24.87 kg/m   Physical Exam   General Appearance:    Alert, cooperative, no distress  Eyes:    PERRL, conjunctiva/corneas clear, EOM's intact       Lungs:     Clear to auscultation bilaterally, respirations unlabored  Heart:    Regular rate and rhythm  Neurologic:   Awake, alert, oriented x 3. No apparent focal neurological           defect.         Results for orders placed or performed in visit on  03/02/16  POCT glycosylated hemoglobin (Hb A1C)  Result Value Ref Range   Hemoglobin A1C 6.2    Est. average glucose Bld gHb Est-mCnc 131        Assessment & Plan:     1. Controlled type 2 diabetes mellitus without complication, without long-term current use of insulin ([place]) Well controlled.  Continue current medications.   - POCT glycosylated hemoglobin (Hb A1C)  2. Essential (primary) hypertension Well controlled.  Continue current medications.    3. HLD (hyperlipidemia) She is tolerating lovastatin well with no adverse effects.         Lelon Huh, MD  [facility]

## 2016-04-20 ENCOUNTER — Ambulatory Visit (INDEPENDENT_AMBULATORY_CARE_PROVIDER_SITE_OTHER): Payer: PPO

## 2016-04-20 DIAGNOSIS — Z23 Encounter for immunization: Secondary | ICD-10-CM

## 2016-05-01 ENCOUNTER — Other Ambulatory Visit: Payer: Self-pay | Admitting: Family Medicine

## 2016-06-01 ENCOUNTER — Other Ambulatory Visit: Payer: Self-pay

## 2016-06-01 DIAGNOSIS — Z1231 Encounter for screening mammogram for malignant neoplasm of breast: Secondary | ICD-10-CM

## 2016-06-05 ENCOUNTER — Other Ambulatory Visit: Payer: Self-pay | Admitting: Family Medicine

## 2016-06-06 DIAGNOSIS — H401131 Primary open-angle glaucoma, bilateral, mild stage: Secondary | ICD-10-CM | POA: Diagnosis not present

## 2016-06-13 DIAGNOSIS — H401131 Primary open-angle glaucoma, bilateral, mild stage: Secondary | ICD-10-CM | POA: Diagnosis not present

## 2016-06-20 ENCOUNTER — Telehealth: Payer: Self-pay | Admitting: Family Medicine

## 2016-06-20 NOTE — Telephone Encounter (Signed)
Called Pt to schedule AWV with [facility] - knb °

## 2016-06-27 ENCOUNTER — Encounter: Payer: Self-pay | Admitting: Family Medicine

## 2016-06-27 ENCOUNTER — Ambulatory Visit (INDEPENDENT_AMBULATORY_CARE_PROVIDER_SITE_OTHER): Payer: PPO | Admitting: Family Medicine

## 2016-06-27 VITALS — BP 120/52 | HR 81 | Temp 98.4°F | Resp 16 | Ht 62.0 in | Wt 131.0 lb

## 2016-06-27 DIAGNOSIS — K222 Esophageal obstruction: Secondary | ICD-10-CM | POA: Diagnosis not present

## 2016-06-27 DIAGNOSIS — R42 Dizziness and giddiness: Secondary | ICD-10-CM | POA: Diagnosis not present

## 2016-06-27 DIAGNOSIS — M1990 Unspecified osteoarthritis, unspecified site: Secondary | ICD-10-CM | POA: Diagnosis not present

## 2016-06-27 DIAGNOSIS — R490 Dysphonia: Secondary | ICD-10-CM

## 2016-06-27 DIAGNOSIS — K219 Gastro-esophageal reflux disease without esophagitis: Secondary | ICD-10-CM | POA: Diagnosis not present

## 2016-06-27 DIAGNOSIS — I10 Essential (primary) hypertension: Secondary | ICD-10-CM | POA: Diagnosis not present

## 2016-06-27 DIAGNOSIS — E1149 Type 2 diabetes mellitus with other diabetic neurological complication: Secondary | ICD-10-CM

## 2016-06-27 DIAGNOSIS — J449 Chronic obstructive pulmonary disease, unspecified: Secondary | ICD-10-CM | POA: Diagnosis not present

## 2016-06-27 MED ORDER — TRAMADOL HCL 50 MG PO TABS: 50.0000 mg | ORAL_TABLET | Freq: Three times a day (TID) | ORAL | 1 refills | Status: DC | PRN

## 2016-06-27 MED ORDER — OMEPRAZOLE 40 MG PO CPDR: 40.0000 mg | DELAYED_RELEASE_CAPSULE | Freq: Every day | ORAL | 12 refills | Status: DC

## 2016-06-27 MED ORDER — VALSARTAN 160 MG PO TABS: 160.0000 mg | ORAL_TABLET | Freq: Every day | ORAL | 5 refills | Status: DC

## 2016-06-27 NOTE — Progress Notes (Signed)
Patient: Robyn Butler Female    DOB: [birth year]   82 y.o.   MRN: [id number] Visit Date: 06/27/2016  Today's Provider: Lelon Huh, MD   Chief Complaint  Patient presents with  . Follow-up  . Diabetes  . Hypertension  . Hyperlipidemia   Subjective:    HPI   Diabetes Mellitus Type II, Follow-up:   Lab Results  Component Value Date   HGBA1C 6.2 03/02/2016   HGBA1C 6.3 12/02/2015   HGBA1C 6.3 08/05/2015   Last seen for diabetes 4 months ago.  Management since then includes; no changes. She reports good compliance with treatment. She is not having side effects. none Current symptoms include none and have been unchanged. Home blood sugar records: fasting range: 170-200  Episodes of hypoglycemia? no   Current Insulin Regimen: n/a   Most Recent Eye Exam: 1 week ago Weight trend: stable Prior visit with dietician: no Current diet: well balanced Current exercise: none  ----------------------------------------------------------------   Hypertension, follow-up:  BP Readings from Last 3 Encounters:  06/27/16 (!) 120/52  03/02/16 (!) 120/56  12/02/15 (!) 130/52    She was last seen for hypertension 4 months ago.  BP at that visit was 120/56. Management since that visit includes; no changes.She reports good compliance with treatment. She is not having side effects. none She is exercising. She is adherent to low salt diet.   Outside blood pressures are 170/80. She is experiencing none.  Patient denies none.   Cardiovascular risk factors include diabetes mellitus.  Use of agents associated with hypertension: none.   ----------------------------------------------------------------    Lipid/Cholesterol, Follow-up:   Last seen for this 4 months ago.  Management since that visit includes; no changes.  Last Lipid Panel:    Component Value Date/Time   CHOL 139 12/02/2015 1151   CHOL 96 11/10/2012 0512   TRIG 138 12/02/2015 1151   TRIG 68 11/10/2012  0512   HDL 42 12/02/2015 1151   HDL 36 (L) 11/10/2012 0512   CHOLHDL 3.3 12/02/2015 1151   VLDL 14 11/10/2012 0512   LDLCALC 69 12/02/2015 1151   LDLCALC 46 11/10/2012 0512    She reports good compliance with treatment. She is not having side effects. none  Wt Readings from Last 3 Encounters:  06/27/16 131 lb (59.4 kg)  03/02/16 136 lb (61.7 kg)  12/02/15 138 lb (62.6 kg)    ----------------------------------------------------------------  Follow up COPD:  Doing well, on no maintenance inhalers. Rarely requires albuterol. Stopped smoking 30 years ago.   Hoarseness She also complains of hoarseness for at least the last 6 months. She feels like it may be due to mucous in her throat, but denies any other allergies symptoms including nasal/sinus congestion/ or nasal drainage.  She had EGD a year with finding of esophageal stricture. PPI was increased at that time. Denies heartburn or sore throat.   Allergies  Allergen Reactions  . Alendronate     Other reaction(s): Vomiting     Current Outpatient Prescriptions:  .  albuterol (PROVENTIL HFA;VENTOLIN HFA) 108 (90 BASE) MCG/ACT inhaler, Inhale into the lungs every 6 (six) hours as needed for wheezing or shortness of breath., Disp: , Rfl:  .  amLODipine (NORVASC) 5 MG tablet, TAKE ONE (1) TABLET BY MOUTH EVERY DAY, Disp: 90 tablet, Rfl: 4 .  aspirin 81 MG tablet, Take 81 mg by mouth daily., Disp: , Rfl:  .  Bismuth Subsalicylate (BISMUTH PO), Take 2 tablets by mouth as needed., Disp: ,  Rfl:  .  Diphenhydramine-APAP, sleep, (TYLENOL PM EXTRA STRENGTH PO), Take 1 tablet by mouth at bedtime as needed., Disp: , Rfl:  .  dorzolamide (TRUSOPT) 2 % ophthalmic solution, 1 drop 2 (two) times daily., Disp: , Rfl:  .  furosemide (LASIX) 20 MG tablet, TAKE ONE (1) TABLET BY MOUTH EVERY DAY FOR SWELLING, Disp: 30 tablet, Rfl: 5 .  glipiZIDE-metformin (METAGLIP) 2.5-500 MG tablet, TAKE ONE TABLET BY MOUTH EVERY MORNING, Disp: 90 tablet, Rfl: 4 .   glucose blood (ONE TOUCH ULTRA TEST) test strip, TEST TWICE DAILY, Disp: 100 each, Rfl: 4 .  ibandronate (BONIVA) 150 MG tablet, TAKE ONE TABLET ONCE A MONTH FIRST THINGIN THE MORNING AT LEAST 1 HOUR BEFORE EATING, TAKE WITH WATER. DON'T LIE DOWN FOR 30 MINUTES., Disp: 1 tablet, Rfl: 12 .  latanoprost (XALATAN) 0.005 % ophthalmic solution, 1 drop at bedtime., Disp: , Rfl:  .  levothyroxine (SYNTHROID, LEVOTHROID) 100 MCG tablet, TAKE ONE (1) TABLET BY MOUTH EVERY DAY, Disp: 90 tablet, Rfl: 4 .  losartan-hydrochlorothiazide (HYZAAR) 100-25 MG tablet, TAKE ONE (1) TABLET EACH DAY, Disp: 30 tablet, Rfl: 12 .  lovastatin (MEVACOR) 40 MG tablet, TAKE ONE TABLET BY MOUTH EVERY NIGHT AT BEDTIME, Disp: 90 tablet, Rfl: 4 .  nortriptyline (PAMELOR) 25 MG capsule, TAKE ONE CAPSULE AT BEDTIME, Disp: 30 capsule, Rfl: 5 .  omeprazole (PRILOSEC) 40 MG capsule, TAKE ONE CAPSULE BY MOUTH DAILY, Disp: , Rfl:  .  Polyethylene Glycol 3350 (DULCOLAX BALANCE PO), Take 2 tablets by mouth as needed., Disp: , Rfl:  .  SPIRIVA RESPIMAT 2.5 MCG/ACT AERS, 2 PUFFS EVERY DAY, Disp: 4 g, Rfl: 12  Review of Systems  Constitutional: Negative for appetite change, chills, fatigue and fever.  Respiratory: Negative for chest tightness and shortness of breath.   Cardiovascular: Negative for chest pain and palpitations.  Gastrointestinal: Negative for abdominal pain, nausea and vomiting.  Neurological: Negative for dizziness and weakness.    Social History  Substance Use Topics  . Smoking status: Former Smoker    Quit date: 07/18/1991  . Smokeless tobacco: Never Used  . Alcohol use No   Objective:   BP (!) 120/52 (BP Location: Left Arm, Patient Position: Sitting, Cuff Size: Normal)   Pulse 81   Temp 98.4 F (36.9 C) (Oral)   Resp 16   Ht 5\' 2"  (1.575 m)   Wt 131 lb (59.4 kg)   SpO2 94%   BMI 23.96 kg/m    Functional Status Survey: Is the patient deaf or have difficulty hearing?: Yes Does the patient have difficulty  seeing, even when wearing glasses/contacts?: Yes Does the patient have difficulty concentrating, remembering, or making decisions?: Yes Does the patient have difficulty walking or climbing stairs?: Yes Does the patient have difficulty dressing or bathing?: No Does the patient have difficulty doing errands alone such as visiting a doctor's office or shopping?: No  Audit-C Alcohol Use Screening  Question Answer Points  How often do you have alcoholic drink? never 0  On days you do drink alcohol, how many drinks do you typically consume? 0 0  How oftey will you drink 6 or more in a total? never 0  Total Score:  0   A score of 3 or more in women, and 4 or more in men indicates increased risk for alcohol abuse, EXCEPT if all of the points are from question 1.  Depression screen [facility] 2/9 06/27/2016 04/28/2015 04/28/2015  Decreased Interest 1 0 0  Down, Depressed, Hopeless  1 0 0  PHQ - 2 Score 2 0 0  Altered sleeping 3 - -  Tired, decreased energy 0 - -  Change in appetite 0 - -  Trouble concentrating 0 - -  Moving slowly or fidgety/restless 0 - -  Suicidal thoughts 1 - -  PHQ-9 Score 6 - -  Difficult doing work/chores Very difficult - -     Physical Exam  General Appearance:    Alert, cooperative, no distress  HENT:   bilateral TM normal without fluid or infection, neck without nodes, throat normal without erythema or exudate, sinuses nontender and nasal mucosa congested  Eyes:    PERRL, conjunctiva/corneas clear, EOM's intact       Lungs:     Clear to auscultation bilaterally, respirations unlabored  Heart:    Regular rate and rhythm  Neurologic:   Awake, alert, oriented x 3. No apparent focal neurological           defect.       Results for orders placed or performed in visit on 03/02/16  POCT glycosylated hemoglobin (Hb A1C)  Result Value Ref Range   Hemoglobin A1C 6.2    Est. average glucose Bld gHb Est-mCnc 131        Assessment & Plan:     1. Other diabetic  neurological complication associated with type 2 diabetes mellitus ([place]) Well controlled.  Continue current medications.   - POCT glycosylated hemoglobin (Hb A1C)  2. Esophageal stricture On high dose PPI  3. Essential (primary) hypertension She feels that losartan-hctz is contributing to her dizziness and would like to change medications.  - valsartan (DIOVAN) 160 MG tablet; Take 1 tablet (160 mg total) by mouth daily.  Dispense: 30 tablet; Refill: 5 - Comprehensive metabolic panel - CBC - T4 AND TSH  4. Dizziness Change BP medications as above.  - Comprehensive metabolic panel - CBC - T4 AND TSH  5. Hoarseness, persistent Already on 40mg  omeprazole for reflux. May may have some issues with post nasal drainage. She would like evaluation by ENT for better idea of cause.  - Ambulatory referral to ENT  6. Gastroesophageal reflux disease, esophagitis presence not specified Continue omeprazole 40mg  daily.   7. Osteoarthritis, unspecified osteoarthritis type, unspecified site Refilled tramadol which she takes occasionally.    8. Follow up COPD Doing well with prn use of albuterol inhaler which she rarely requires.   Return in about 6 weeks (around 08/08/2016).     The entirety of the information documented in the History of Present Illness, Review of Systems and Physical Exam were personally obtained by me. Portions of this information were initially documented by April M. Sabra Heck, CMA and reviewed by me for thoroughness and accuracy.     Lelon Huh, MD  [facility]

## 2016-06-28 ENCOUNTER — Telehealth: Payer: Self-pay

## 2016-06-28 LAB — COMPREHENSIVE METABOLIC PANEL
ALBUMIN: 4.7 g/dL (ref 3.5–4.7)
ALK PHOS: 67 IU/L (ref 39–117)
ALT: 8 IU/L (ref 0–32)
AST: 15 IU/L (ref 0–40)
Albumin/Globulin Ratio: 1.5 (ref 1.2–2.2)
BILIRUBIN TOTAL: 0.5 mg/dL (ref 0.0–1.2)
BUN / CREAT RATIO: 20 (ref 12–28)
BUN: 30 mg/dL — ABNORMAL HIGH (ref 8–27)
CHLORIDE: 100 mmol/L (ref 96–106)
CO2: 22 mmol/L (ref 18–29)
Calcium: 9.7 mg/dL (ref 8.7–10.3)
Creatinine, Ser: 1.48 mg/dL — ABNORMAL HIGH (ref 0.57–1.00)
GFR calc Af Amer: 37 mL/min/{1.73_m2} — ABNORMAL LOW (ref >59)
GFR calc non Af Amer: 32 mL/min/{1.73_m2} — ABNORMAL LOW (ref >59)
GLOBULIN, TOTAL: 3.2 g/dL (ref 1.5–4.5)
GLUCOSE: 142 mg/dL — AB (ref 65–99)
Potassium: 4.1 mmol/L (ref 3.5–5.2)
SODIUM: 142 mmol/L (ref 134–144)
Total Protein: 7.9 g/dL (ref 6.0–8.5)

## 2016-06-28 LAB — T4 AND TSH
T4 TOTAL: 8.6 ug/dL (ref 4.5–12.0)
TSH: 1.1 u[IU]/mL (ref 0.450–4.500)

## 2016-06-28 LAB — CBC
Hematocrit: 32.4 % — ABNORMAL LOW (ref 34.0–46.6)
Hemoglobin: 10.2 g/dL — ABNORMAL LOW (ref 11.1–15.9)
MCH: 29.2 pg (ref 26.6–33.0)
MCHC: 31.5 g/dL (ref 31.5–35.7)
MCV: 93 fL (ref 79–97)
PLATELETS: 248 10*3/uL (ref 150–379)
RBC: 3.49 x10E6/uL — AB (ref 3.77–5.28)
RDW: 13 % (ref 12.3–15.4)
WBC: 6.8 10*3/uL (ref 3.4–10.8)

## 2016-06-28 LAB — POCT GLYCOSYLATED HEMOGLOBIN (HGB A1C)
Est. average glucose Bld gHb Est-mCnc: 126
Hemoglobin A1C: 6

## 2016-06-28 NOTE — Telephone Encounter (Signed)
-----   Message from Birdie Sons, MD sent at 06/28/2016  8:06 AM EST ----- Labs are normal. Slight reduction in kidney functions. Need to drink more water. Follow up in January as scheduled.

## 2016-06-28 NOTE — Telephone Encounter (Signed)
[id number] 

## 2016-07-03 NOTE — Telephone Encounter (Signed)
Patient advised and verbally voiced understanding.  

## 2016-07-14 DIAGNOSIS — R490 Dysphonia: Secondary | ICD-10-CM | POA: Diagnosis not present

## 2016-07-14 DIAGNOSIS — J301 Allergic rhinitis due to pollen: Secondary | ICD-10-CM | POA: Diagnosis not present

## 2016-08-07 ENCOUNTER — Ambulatory Visit
Admission: RE | Admit: 2016-08-07 | Discharge: 2016-08-07 | Disposition: A | Discharge status: Home / Self Care | Payer: PPO | Source: Ambulatory Visit | Attending: General Surgery | Admitting: General Surgery

## 2016-08-07 DIAGNOSIS — Z1231 Encounter for screening mammogram for malignant neoplasm of breast: Secondary | ICD-10-CM | POA: Diagnosis not present

## 2016-08-08 ENCOUNTER — Other Ambulatory Visit: Payer: Self-pay | Admitting: Family Medicine

## 2016-08-08 ENCOUNTER — Encounter: Payer: Self-pay | Admitting: Family Medicine

## 2016-08-08 ENCOUNTER — Ambulatory Visit (INDEPENDENT_AMBULATORY_CARE_PROVIDER_SITE_OTHER): Payer: PPO | Admitting: Family Medicine

## 2016-08-08 ENCOUNTER — Encounter: Payer: Self-pay | Admitting: *Deleted

## 2016-08-08 VITALS — BP 162/58 | HR 78 | Temp 97.6°F | Resp 16 | Wt 132.0 lb

## 2016-08-08 DIAGNOSIS — I10 Essential (primary) hypertension: Secondary | ICD-10-CM

## 2016-08-08 MED ORDER — VALSARTAN-HYDROCHLOROTHIAZIDE 160-12.5 MG PO TABS: 1.0000 | ORAL_TABLET | Freq: Every day | ORAL | 1 refills | Status: DC

## 2016-08-08 NOTE — Progress Notes (Signed)
Patient: Robyn Butler Female    DOB: 01-Jan-1929   83 y.o.   MRN: [id number] Visit Date: 08/08/2016  Today's Provider: Lelon Huh, MD   Chief Complaint  Patient presents with  . Hypertension   Subjective:    HPI  Hypertension, follow-up:  BP Readings from Last 3 Encounters:  08/08/16 (!) 162/58  06/27/16 (!) 120/52  03/02/16 (!) 120/56    She was last seen for hypertension 1 months ago.  BP at that visit was 120/52. Management since that visit includes discontinuing Losartan/HCTZ 100-12.5 due to dizziness. Patient was started on Valsartan 160mg  daily.  She reports good compliance with treatment. She is having side effects. She reports that she has had some mild headaches. She is exercising. She is adherent to low salt diet.   Outside blood pressures are 170s/70s. She is experiencing fatigue.  Patient denies lower extremity edema.   Cardiovascular risk factors include diabetes mellitus.     Weight trend: stable Wt Readings from Last 3 Encounters:  08/08/16 132 lb (59.9 kg)  06/27/16 131 lb (59.4 kg)  03/02/16 136 lb (61.7 kg)    Current diet: well balanced        Allergies  Allergen Reactions  . Alendronate     Other reaction(s): Vomiting     Current Outpatient Prescriptions:  .  albuterol (PROVENTIL HFA;VENTOLIN HFA) 108 (90 BASE) MCG/ACT inhaler, Inhale into the lungs every 6 (six) hours as needed for wheezing or shortness of breath., Disp: , Rfl:  .  amLODipine (NORVASC) 5 MG tablet, TAKE ONE (1) TABLET BY MOUTH EVERY DAY, Disp: 90 tablet, Rfl: 4 .  aspirin 81 MG tablet, Take 81 mg by mouth daily., Disp: , Rfl:  .  Bismuth Subsalicylate (BISMUTH PO), Take 2 tablets by mouth as needed., Disp: , Rfl:  .  Diphenhydramine-APAP, sleep, (TYLENOL PM EXTRA STRENGTH PO), Take 1 tablet by mouth at bedtime as needed., Disp: , Rfl:  .  dorzolamide (TRUSOPT) 2 % ophthalmic solution, 1 drop 2 (two) times daily., Disp: , Rfl:  .  furosemide (LASIX) 20 MG  tablet, TAKE ONE (1) TABLET BY MOUTH EVERY DAY FOR SWELLING, Disp: 30 tablet, Rfl: 5 .  glipiZIDE-metformin (METAGLIP) 2.5-500 MG tablet, TAKE ONE TABLET BY MOUTH EVERY MORNING, Disp: 90 tablet, Rfl: 4 .  glucose blood (ONE TOUCH ULTRA TEST) test strip, TEST TWICE DAILY, Disp: 100 each, Rfl: 4 .  ibandronate (BONIVA) 150 MG tablet, TAKE ONE TABLET ONCE A MONTH FIRST THINGIN THE MORNING AT LEAST 1 HOUR BEFORE EATING, TAKE WITH WATER. DON'T LIE DOWN FOR 30 MINUTES., Disp: 1 tablet, Rfl: 12 .  latanoprost (XALATAN) 0.005 % ophthalmic solution, 1 drop at bedtime., Disp: , Rfl:  .  levothyroxine (SYNTHROID, LEVOTHROID) 100 MCG tablet, TAKE ONE (1) TABLET BY MOUTH EVERY DAY, Disp: 90 tablet, Rfl: 4 .  lovastatin (MEVACOR) 40 MG tablet, TAKE ONE TABLET BY MOUTH EVERY NIGHT AT BEDTIME, Disp: 90 tablet, Rfl: 4 .  nortriptyline (PAMELOR) 25 MG capsule, TAKE ONE CAPSULE AT BEDTIME, Disp: 30 capsule, Rfl: 5 .  omeprazole (PRILOSEC) 40 MG capsule, Take 1 capsule (40 mg total) by mouth daily., Disp: 30 capsule, Rfl: 12 .  Polyethylene Glycol 3350 (DULCOLAX BALANCE PO), Take 2 tablets by mouth as needed., Disp: , Rfl:  .  SPIRIVA RESPIMAT 2.5 MCG/ACT AERS, 2 PUFFS EVERY DAY, Disp: 4 g, Rfl: 12 .  traMADol (ULTRAM) 50 MG tablet, Take 1 tablet (50 mg total) by mouth every  8 (eight) hours as needed., Disp: 30 tablet, Rfl: 1 .  valsartan (DIOVAN) 160 MG tablet, Take 1 tablet (160 mg total) by mouth daily., Disp: 30 tablet, Rfl: 5  Review of Systems  Constitutional: Positive for fatigue.  Respiratory: Negative.   Cardiovascular: Negative for chest pain, palpitations and leg swelling.  Neurological: Positive for light-headedness and headaches. Negative for dizziness.    Social History  Substance Use Topics  . Smoking status: Former Smoker    Quit date: 07/18/1991  . Smokeless tobacco: Never Used  . Alcohol use No   Objective:   BP (!) 162/58 (BP Location: Left Arm, Patient Position: Sitting, Cuff Size: Normal)    Pulse 78   Temp 97.6 F (36.4 C)   Resp 16   Wt 132 lb (59.9 kg)   SpO2 97%   BMI 24.14 kg/m   Physical Exam   General Appearance:    Alert, cooperative, no distress  Eyes:    PERRL, conjunctiva/corneas clear, EOM's intact       Lungs:     Clear to auscultation bilaterally, respirations unlabored  Heart:    Regular rate and rhythm  Neurologic:   Awake, alert, oriented x 3. No apparent focal neurological           defect.           Assessment & Plan:     1. Essential (primary) hypertension Dizziness resolved since stopping losartan-hctz, but SBP now uncontrolled.Will change from valsartan 160 to valsartan-hctz 160-12.5 and follow up in 7-8 weeks.  - valsartan-hydrochlorothiazide (DIOVAN-HCT) 160-12.5 MG tablet; Take 1 tablet by mouth daily.  Dispense: 30 tablet; Refill: 1       Lelon Huh, MD  [facility]

## 2016-08-14 ENCOUNTER — Encounter: Payer: Self-pay | Admitting: General Surgery

## 2016-08-14 ENCOUNTER — Ambulatory Visit (INDEPENDENT_AMBULATORY_CARE_PROVIDER_SITE_OTHER): Payer: PPO | Admitting: General Surgery

## 2016-08-14 VITALS — BP 142/72 | HR 78 | Resp 14 | Ht 62.0 in | Wt 134.0 lb

## 2016-08-14 DIAGNOSIS — Z1231 Encounter for screening mammogram for malignant neoplasm of breast: Secondary | ICD-10-CM

## 2016-08-14 NOTE — Patient Instructions (Signed)
No need to continue annual bilateral screening mammogram. Continue annual breast exams with PCP.

## 2016-08-14 NOTE — Progress Notes (Signed)
Patient ID: ANGLE DOTO, female   DOB: 1928/08/10, 83 y.o.   MRN: [id number]  Chief Complaint  Patient presents with  . Follow-up    HPI Robyn Butler is a 83 y.o. female HPI who presents for a breast evaluation. The most recent mammogram was done on 08/07/2016 .  Patient does perform regular self breast checks and gets regular mammograms done.   I have reviewed the history of present illness with the patient.   Past Medical History:  Diagnosis Date  . Arthritis   . Asthma   . Cancer ([place])   . Chronic kidney disease   . COPD (chronic obstructive pulmonary disease) ([place])   . Diabetes mellitus without complication ([place])   . Glaucoma   . Hypertension   . Thyroid disorder   . Varicose veins of lower extremities with other complications     Past Surgical History:  Procedure Laterality Date  . ABDOMINAL HYSTERECTOMY  1972  . BLADDER SUSPENSION    . BREAST EXCISIONAL BIOPSY Right 1999   neg  . CHOLECYSTECTOMY  1975  . COLONOSCOPY    . COLONOSCOPY WITH PROPOFOL N/A 05/24/2015   Procedure: COLONOSCOPY WITH PROPOFOL;  Surgeon: Hulen Luster, MD;  Location[facility] ENDOSCOPY;  Service: Gastroenterology;  Laterality: N/A;  . [place]  . ESOPHAGOGASTRODUODENOSCOPY N/A 05/24/2015   Procedure: ESOPHAGOGASTRODUODENOSCOPY (EGD);  Surgeon: Hulen Luster, MD;  Location: [facility] ENDOSCOPY;  Service: Gastroenterology;  Laterality: N/A;  . FRACTURE SURGERY    . JOINT REPLACEMENT Right 2013   hip  . salpingo oophorectmy     . THYROIDECTOMY  1969  . WRIST FRACTURE SURGERY Left     Family History  Problem Relation Age of Onset  . Heart attack Father   . Asthma Father   . Kidney disease Mother   . Hypertension Mother   . Hypertension Other   . Diabetes Other   . Heart attack Other   . Breast cancer Neg Hx     Social History Social History  Substance Use Topics  . Smoking status: Former Smoker    Quit date: 07/18/1991  . Smokeless tobacco: Never Used  . Alcohol use No    Allergies   Allergen Reactions  . Alendronate     Other reaction(s): Vomiting    Current Outpatient Prescriptions  Medication Sig Dispense Refill  . albuterol (PROVENTIL HFA;VENTOLIN HFA) 108 (90 BASE) MCG/ACT inhaler Inhale into the lungs every 6 (six) hours as needed for wheezing or shortness of breath.    Marland Kitchen amLODipine (NORVASC) 5 MG tablet TAKE ONE (1) TABLET BY MOUTH EVERY DAY 90 tablet 4  . aspirin 81 MG tablet Take 81 mg by mouth daily.    . Bismuth Subsalicylate (BISMUTH PO) Take 2 tablets by mouth as needed.    . Diphenhydramine-APAP, sleep, (TYLENOL PM EXTRA STRENGTH PO) Take 1 tablet by mouth at bedtime as needed.    . dorzolamide (TRUSOPT) 2 % ophthalmic solution 1 drop 2 (two) times daily.    . furosemide (LASIX) 20 MG tablet TAKE ONE (1) TABLET BY MOUTH EVERY DAY FOR SWELLING 30 tablet 5  . glipiZIDE-metformin (METAGLIP) 2.5-500 MG tablet TAKE ONE TABLET BY MOUTH EVERY MORNING 90 tablet 4  . glucose blood (ONE TOUCH ULTRA TEST) test strip TEST TWICE DAILY 100 each 4  . ibandronate (BONIVA) 150 MG tablet TAKE ONE TABLET ONCE A MONTH FIRST THINGIN THE MORNING AT LEAST 1 HOUR BEFORE EATING, TAKE WITH WATER. DON'T LIE DOWN FOR 30 MINUTES.  1 tablet 12  . latanoprost (XALATAN) 0.005 % ophthalmic solution 1 drop at bedtime.    Marland Kitchen levothyroxine (SYNTHROID, LEVOTHROID) 100 MCG tablet TAKE ONE (1) TABLET BY MOUTH EVERY DAY 90 tablet 4  . lovastatin (MEVACOR) 40 MG tablet TAKE ONE TABLET BY MOUTH EVERY NIGHT AT BEDTIME 90 tablet 4  . nortriptyline (PAMELOR) 25 MG capsule TAKE ONE CAPSULE AT BEDTIME 30 capsule 5  . omeprazole (PRILOSEC) 40 MG capsule Take 1 capsule (40 mg total) by mouth daily. 30 capsule 12  . Polyethylene Glycol 3350 (DULCOLAX BALANCE PO) Take 2 tablets by mouth as needed.    Marland Kitchen SPIRIVA RESPIMAT 2.5 MCG/ACT AERS 2 PUFFS EVERY DAY 4 g 12  . traMADol (ULTRAM) 50 MG tablet Take 1 tablet (50 mg total) by mouth every 8 (eight) hours as needed. 30 tablet 1  . valsartan-hydrochlorothiazide  (DIOVAN-HCT) 160-12.5 MG tablet Take 1 tablet by mouth daily. 30 tablet 1   No current facility-administered medications for this visit.     Review of Systems Review of Systems  Constitutional: Negative.   Respiratory: Negative.   Cardiovascular: Negative.     Blood pressure (!) 142/72, pulse 78, resp. rate 14, height 5\' 2"  (1.575 m), weight 134 lb (60.8 kg).  Physical Exam Physical Exam  Constitutional: She is oriented to person, place, and time. She appears well-developed and well-nourished.  Eyes: Conjunctivae are normal. No scleral icterus.  Neck: Neck supple.  Cardiovascular: Normal rate, regular rhythm and normal heart sounds.   Pulmonary/Chest: Effort normal and breath sounds normal. Right breast exhibits no inverted nipple, no mass, no nipple discharge, no skin change and no tenderness. Left breast exhibits no inverted nipple, no mass, no nipple discharge, no skin change and no tenderness.  Abdominal: Soft. Bowel sounds are normal.  Lymphadenopathy:    She has no cervical adenopathy.    She has no axillary adenopathy.  Neurological: She is alert and oriented to person, place, and time.  Skin: Skin is warm and dry.    Data Reviewed Mammogram reviewed   Assessment    Stable exam    Plan     Given age and history of stable mammograms and breast exams, it is reasonable to discontinue annual bilateral screening mammograms. Patient understands and is agreeable. Continue annual breast exams with PCP.  This information has been scribed by Gaspar Cola CMA.       Harol Shabazz G 08/15/2016, 11:54 AM

## 2016-09-27 ENCOUNTER — Other Ambulatory Visit: Payer: Self-pay | Admitting: Family Medicine

## 2016-10-03 ENCOUNTER — Encounter: Payer: Self-pay | Admitting: Family Medicine

## 2016-10-03 ENCOUNTER — Ambulatory Visit (INDEPENDENT_AMBULATORY_CARE_PROVIDER_SITE_OTHER): Payer: PPO | Admitting: Family Medicine

## 2016-10-03 VITALS — BP 164/62 | HR 82 | Temp 97.6°F | Resp 16 | Wt 134.0 lb

## 2016-10-03 DIAGNOSIS — I10 Essential (primary) hypertension: Secondary | ICD-10-CM | POA: Diagnosis not present

## 2016-10-03 DIAGNOSIS — M169 Osteoarthritis of hip, unspecified: Secondary | ICD-10-CM | POA: Insufficient documentation

## 2016-10-03 MED ORDER — VALSARTAN-HYDROCHLOROTHIAZIDE 160-25 MG PO TABS: 1.0000 | ORAL_TABLET | Freq: Every day | ORAL | 3 refills | Status: DC

## 2016-10-03 NOTE — Progress Notes (Signed)
Patient: Robyn Butler Female    DOB: 14-Jun-1929   83 y.o.   MRN: [id number] Visit Date: 10/03/2016  Today's Provider: Lelon Huh, MD   Chief Complaint  Patient presents with  . Hypertension    follow up   Subjective:    HPI  Hypertension, follow-up:  BP Readings from Last 3 Encounters:  08/14/16 (!) 142/72  08/08/16 (!) 162/58  06/27/16 (!) 120/52    She was last seen for hypertension 2 months ago.  BP at that visit was 162/58. Management since that visit includes changing from Valsartan 160mg  to Valsartan-HCTZ 160-12.5mg . She reports good compliance with treatment. She is not having side effects.  She is not exercising. She is not adherent to low salt diet.   Outside blood pressures are running 332-951 (systolic)  over 80 (diastolic). She is experiencing dyspnea, exertional chest pressure/discomfort, lower extremity edema and palpitations.  Patient denies claudication, fatigue, irregular heart beat, near-syncope and tachypnea.   Cardiovascular risk factors include advanced age (older than 50 for men, 4 for women) and hypertension.  Use of agents associated with hypertension: NSAIDS.     Weight trend: stable Wt Readings from Last 3 Encounters:  08/14/16 134 lb (60.8 kg)  08/08/16 132 lb (59.9 kg)  06/27/16 131 lb (59.4 kg)    Current diet: in general, an "unhealthy" diet  ------------------------------------------------------------------------  States has been having more frequent right hip pain the last few months. States she takes occasional tramadol every couple of days which helps quite a bit.     Allergies  Allergen Reactions  . Alendronate     Other reaction(s): Vomiting     Current Outpatient Prescriptions:  .  albuterol (PROVENTIL HFA;VENTOLIN HFA) 108 (90 BASE) MCG/ACT inhaler, Inhale into the lungs every 6 (six) hours as needed for wheezing or shortness of breath., Disp: , Rfl:  .  amLODipine (NORVASC) 5 MG tablet, TAKE ONE (1) TABLET  EACH DAY, Disp: 90 tablet, Rfl: 4 .  aspirin 81 MG tablet, Take 81 mg by mouth daily., Disp: , Rfl:  .  Bismuth Subsalicylate (BISMUTH PO), Take 2 tablets by mouth as needed., Disp: , Rfl:  .  Diphenhydramine-APAP, sleep, (TYLENOL PM EXTRA STRENGTH PO), Take 1 tablet by mouth at bedtime as needed., Disp: , Rfl:  .  dorzolamide (TRUSOPT) 2 % ophthalmic solution, 1 drop 2 (two) times daily., Disp: , Rfl:  .  glipiZIDE-metformin (METAGLIP) 2.5-500 MG tablet, TAKE ONE TABLET BY MOUTH EVERY MORNING, Disp: 90 tablet, Rfl: 4 .  glucose blood (ONE TOUCH ULTRA TEST) test strip, TEST TWICE DAILY, Disp: 100 each, Rfl: 4 .  ibandronate (BONIVA) 150 MG tablet, TAKE ONE TABLET ONCE A MONTH FIRST THINGIN THE MORNING AT LEAST 1 HOUR BEFORE EATING, TAKE WITH WATER. DON'T LIE DOWN FOR 30 MINUTES., Disp: 1 tablet, Rfl: 12 .  latanoprost (XALATAN) 0.005 % ophthalmic solution, 1 drop at bedtime., Disp: , Rfl:  .  levothyroxine (SYNTHROID, LEVOTHROID) 100 MCG tablet, TAKE ONE (1) TABLET BY MOUTH EVERY DAY, Disp: 90 tablet, Rfl: 4 .  nortriptyline (PAMELOR) 25 MG capsule, TAKE ONE CAPSULE AT BEDTIME, Disp: 30 capsule, Rfl: 5 .  omeprazole (PRILOSEC) 40 MG capsule, Take 1 capsule (40 mg total) by mouth daily., Disp: 30 capsule, Rfl: 12 .  Polyethylene Glycol 3350 (DULCOLAX BALANCE PO), Take 2 tablets by mouth as needed., Disp: , Rfl:  .  SPIRIVA RESPIMAT 2.5 MCG/ACT AERS, 2 PUFFS EVERY DAY, Disp: 4 g, Rfl: 12 .  traMADol (ULTRAM) 50 MG tablet, Take 1 tablet (50 mg total) by mouth every 8 (eight) hours as needed., Disp: 30 tablet, Rfl: 1 .  valsartan-hydrochlorothiazide (DIOVAN-HCT) 160-12.5 MG tablet, Take 1 tablet by mouth daily., Disp: 30 tablet, Rfl: 1 .  furosemide (LASIX) 20 MG tablet, TAKE ONE (1) TABLET BY MOUTH EVERY DAY FOR SWELLING (Patient not taking: Reported on 10/03/2016), Disp: 30 tablet, Rfl: 5 .  lovastatin (MEVACOR) 40 MG tablet, TAKE ONE TABLET BY MOUTH EVERY NIGHT AT BEDTIME (Patient not taking: Reported  on 10/03/2016), Disp: 90 tablet, Rfl: 4  Review of Systems  Constitutional: Negative for appetite change, chills, fatigue and fever.  Respiratory: Positive for shortness of breath. Negative for chest tightness.   Cardiovascular: Positive for chest pain, palpitations and leg swelling.  Gastrointestinal: Negative for abdominal pain, nausea and vomiting.  Neurological: Negative for dizziness and weakness.    Social History  Substance Use Topics  . Smoking status: Former Smoker    Quit date: 07/18/1991  . Smokeless tobacco: Never Used  . Alcohol use No   Objective:   BP (!) 164/62 (BP Location: Left Arm, Patient Position: Sitting, Cuff Size: Normal)   Pulse 82   Temp 97.6 F (36.4 C) (Oral)   Resp 16   Wt 134 lb (60.8 kg)   SpO2 97% Comment: room air  BMI 24.51 kg/m  There were no vitals filed for this visit.   Physical Exam    General Appearance:    Alert, cooperative, no distress  Eyes:    PERRL, conjunctiva/corneas clear, EOM's intact       Lungs:     Clear to auscultation bilaterally, respirations unlabored  Heart:    Regular rate and rhythm  Neurologic:   Awake, alert, oriented x 3. No apparent focal neurological           defect.        Current Exercise Habits: The patient does not participate in regular exercise at present Exercise limited by: None identified      Assessment & Plan:     1. Essential (primary) hypertension Increase valsartan-hctz to 160-25. Follow up BP check 8months.   2. Osteoarthritis of hip, unspecified laterality, unspecified osteoarthritis type Responds well to prn tramadol. Advised she could take this every day if needed. If not effective then she should see orthopedist.        Lelon Huh, MD  [place]

## 2016-10-27 ENCOUNTER — Other Ambulatory Visit: Payer: Self-pay | Admitting: Family Medicine

## 2016-11-23 ENCOUNTER — Other Ambulatory Visit: Payer: Self-pay | Admitting: Family Medicine

## 2016-12-12 DIAGNOSIS — H401131 Primary open-angle glaucoma, bilateral, mild stage: Secondary | ICD-10-CM | POA: Diagnosis not present

## 2016-12-19 ENCOUNTER — Ambulatory Visit (INDEPENDENT_AMBULATORY_CARE_PROVIDER_SITE_OTHER): Payer: PPO

## 2016-12-19 VITALS — BP 136/48 | HR 76 | Temp 98.7°F | Ht 62.0 in | Wt 132.8 lb

## 2016-12-19 DIAGNOSIS — Z0000 Encounter for general adult medical examination without abnormal findings: Secondary | ICD-10-CM | POA: Diagnosis not present

## 2016-12-19 NOTE — Patient Instructions (Signed)
Robyn Butler , Thank you for taking time to come for your Medicare Wellness Visit. I appreciate your ongoing commitment to your health goals. Please review the following plan we discussed and let me know if I can assist you in the future.   Screening recommendations/referrals: Colonoscopy: completed 05/24/15 Mammogram: completed 08/08/16 Bone Density: completed 10/12/14 Recommended yearly ophthalmology/optometry visit for glaucoma screening and checkup Recommended yearly dental visit for hygiene and checkup  Vaccinations: Influenza vaccine: up to date, due 03/2017 Pneumococcal vaccine: completed series Tdap vaccine: declined today Shingles vaccine: completed 07/20/11  Advanced directives: Advance directive discussed with you today. Even though you declined this today please call our office should you change your mind and we can give you the proper paperwork for you to fill out.  Conditions/risks identified: Recommend increasing water intake to 6 glasses a day.   Next appointment: 01/02/17 @ 11:00 AM   Preventive Care 65 Years and Older, Female Preventive care refers to lifestyle choices and visits with your health care provider that can promote health and wellness. What does preventive care include?  A yearly physical exam. This is also called an annual well check.  Dental exams once or twice a year.  Routine eye exams. Ask your health care provider how often you should have your eyes checked.  Personal lifestyle choices, including:  Daily care of your teeth and gums.  Regular physical activity.  Eating a healthy diet.  Avoiding tobacco and drug use.  Limiting alcohol use.  Practicing safe sex.  Taking low-dose aspirin every day.  Taking vitamin and mineral supplements as recommended by your health care provider. What happens during an annual well check? The services and screenings done by your health care provider during your annual well check will depend on your  age, overall health, lifestyle risk factors, and family history of disease. Counseling  Your health care provider may ask you questions about your:  Alcohol use.  Tobacco use.  Drug use.  Emotional well-being.  Home and relationship well-being.  Sexual activity.  Eating habits.  History of falls.  Memory and ability to understand (cognition).  Work and work Statistician.  Reproductive health. Screening  You may have the following tests or measurements:  Height, weight, and BMI.  Blood pressure.  Lipid and cholesterol levels. These may be checked every 5 years, or more frequently if you are over 80 years old.  Skin check.  Lung cancer screening. You may have this screening every year starting at age 55 if you have a 30-pack-year history of smoking and currently smoke or have quit within the past 15 years.  Fecal occult blood test (FOBT) of the stool. You may have this test every year starting at age 24.  Flexible sigmoidoscopy or colonoscopy. You may have a sigmoidoscopy every 5 years or a colonoscopy every 10 years starting at age 1.  Hepatitis C blood test.  Hepatitis B blood test.  Sexually transmitted disease (STD) testing.  Diabetes screening. This is done by checking your blood sugar (glucose) after you have not eaten for a while (fasting). You may have this done every 1-3 years.  Bone density scan. This is done to screen for osteoporosis. You may have this done starting at age 72.  Mammogram. This may be done every 1-2 years. Talk to your health care provider about how often you should have regular mammograms. Talk with your health care provider about your test results, treatment options, and if necessary, the need for more tests. Vaccines  Your health care provider may recommend certain vaccines, such as:  Influenza vaccine. This is recommended every year. However, adjust any age mentions to 81 where applicable.  Tetanus, diphtheria, and acellular pertussis (Tdap, Td) vaccine. You may need a Td booster  every 10 years.  Zoster vaccine. You may need this after age 31.  Pneumococcal 13-valent conjugate (PCV13) vaccine. One dose is recommended after age 47.  Pneumococcal polysaccharide (PPSV23) vaccine. One dose is recommended after age 60. Talk to your health care provider about which screenings and vaccines you need and how often you need them. This information is not intended to replace advice given to you by your health care provider. Make sure you discuss any questions you have with your health care provider. Document Released: 07/30/2015 Document Revised: 03/22/2016 Document Reviewed: 05/04/2015 Elsevier Interactive Patient Education  2017 [place] Prevention in the Home Falls can cause injuries. They can happen to people of all ages. There are many things you can do to make your home safe and to help prevent falls. What can I do on the outside of my home?  Regularly fix the edges of walkways and driveways and fix any cracks.  Remove anything that might make you trip as you walk through a door, such as a raised step or threshold.  Trim any bushes or trees on the path to your home.  Use bright outdoor lighting.  Clear any walking paths of anything that might make someone trip, such as rocks or tools.  Regularly check to see if handrails are loose or broken. Make sure that both sides of any steps have handrails.  Any raised decks and porches should have guardrails on the edges.  Have any leaves, snow, or ice cleared regularly.  Use sand or salt on walking paths during winter.  Clean up any spills in your garage right away. This includes oil or grease spills. What can I do in the bathroom?  Use night lights.  Install grab bars by the toilet and in the tub and shower. Do not use towel bars as grab bars.  Use non-skid mats or decals in the tub or shower.  If you need to sit down in the shower, use a plastic, non-slip stool.  Keep the floor dry. Clean up any  water that spills on the floor as soon as it happens.  Remove soap buildup in the tub or shower regularly.  Attach bath mats securely with double-sided non-slip rug tape.  Do not have throw rugs and other things on the floor that can make you trip. What can I do in the bedroom?  Use night lights.  Make sure that you have a light by your bed that is easy to reach.  Do not use any sheets or blankets that are too big for your bed. They should not hang down onto the floor.  Have a firm chair that has side arms. You can use this for support while you get dressed.  Do not have throw rugs and other things on the floor that can make you trip. What can I do in the kitchen?  Clean up any spills right away.  Avoid walking on wet floors.  Keep items that you use a lot in easy-to-reach places.  If you need to reach something above you, use a strong step stool that has a grab bar.  Keep electrical cords out of the way.  Do not use floor polish or wax that makes floors slippery. If you must use wax, use non-skid floor wax.  Do  not have throw rugs and other things on the floor that can make you trip. What can I do with my stairs?  Do not leave any items on the stairs.  Make sure that there are handrails on both sides of the stairs and use them. Fix handrails that are broken or loose. Make sure that handrails are as long as the stairways.  Check any carpeting to make sure that it is firmly attached to the stairs. Fix any carpet that is loose or worn.  Avoid having throw rugs at the top or bottom of the stairs. If you do have throw rugs, attach them to the floor with carpet tape.  Make sure that you have a light switch at the top of the stairs and the bottom of the stairs. If you do not have them, ask someone to add them for you. What else can I do to help prevent falls?  Wear shoes that:  Do not have high heels.  Have rubber bottoms.  Are comfortable and fit you well.  Are closed  at the toe. Do not wear sandals.  If you use a stepladder:  Make sure that it is fully opened. Do not climb a closed stepladder.  Make sure that both sides of the stepladder are locked into place.  Ask someone to hold it for you, if possible.  Clearly mark and make sure that you can see:  Any grab bars or handrails.  First and last steps.  Where the edge of each step is.  Use tools that help you move around (mobility aids) if they are needed. These include:  Canes.  Walkers.  Scooters.  Crutches.  Turn on the lights when you go into a dark area. Replace any light bulbs as soon as they burn out.  Set up your furniture so you have a clear path. Avoid moving your furniture around.  If any of your floors are uneven, fix them.  If there are any pets around you, be aware of where they are.  Review your medicines with your doctor. Some medicines can make you feel dizzy. This can increase your chance of falling. Ask your doctor what other things that you can do to help prevent falls. This information is not intended to replace advice given to you by your health care provider. Make sure you discuss any questions you have with your health care provider. Document Released: 04/29/2009 Document Revised: 12/09/2015 Document Reviewed: 08/07/2014 Elsevier Interactive Patient Education  2017 [employer].

## 2016-12-19 NOTE — Progress Notes (Signed)
Subjective:   Robyn Butler is a 83 y.o. female who presents for Medicare Annual (Subsequent) preventive examination.  Review of Systems:  N/A  Cardiac Risk Factors include: diabetes mellitus;advanced age (>32men, >77 women);dyslipidemia;hypertension     Objective:     Vitals: BP (!) 136/48 (BP Location: Right Arm)   Pulse 76   Temp 98.7 F (37.1 C) (Oral)   Ht 5\' 2"  (1.575 m)   Wt 132 lb 12.8 oz (60.2 kg)   BMI 24.29 kg/m   Body mass index is 24.29 kg/m.   Tobacco History  Smoking Status  . Former Smoker  . Quit date: 07/18/1991  Smokeless Tobacco  . Never Used     Counseling given: Not Answered   Past Medical History:  Diagnosis Date  . Arthritis   . Asthma   . Cancer ([place])   . Chronic kidney disease   . COPD (chronic obstructive pulmonary disease) ([place])   . Diabetes mellitus without complication ([place])   . Glaucoma   . Hypertension   . Thyroid disorder   . Varicose veins of lower extremities with other complications    Past Surgical History:  Procedure Laterality Date  . ABDOMINAL HYSTERECTOMY  1972  . BLADDER SUSPENSION    . BREAST EXCISIONAL BIOPSY Right 1999   neg  . CHOLECYSTECTOMY  1975  . COLONOSCOPY    . COLONOSCOPY WITH PROPOFOL N/A 05/24/2015   Procedure: COLONOSCOPY WITH PROPOFOL;  Surgeon: Hulen Luster, MD;  Location: [facility] ENDOSCOPY;  Service: Gastroenterology;  Laterality: N/A;  . [place]  . ESOPHAGOGASTRODUODENOSCOPY N/A 05/24/2015   Procedure: ESOPHAGOGASTRODUODENOSCOPY (EGD);  Surgeon: Hulen Luster, MD;  Location: [facility] ENDOSCOPY;  Service: Gastroenterology;  Laterality: N/A;  . FRACTURE SURGERY    . JOINT REPLACEMENT Right 2013   hip  . salpingo oophorectmy     . THYROIDECTOMY  1969  . WRIST FRACTURE SURGERY Left    Family History  Problem Relation Age of Onset  . Heart attack Father   . Asthma Father   . Kidney disease Mother   . Hypertension Mother   . Hypertension Other   . Diabetes Other   . Heart attack Other   .  Breast cancer Neg Hx    History  Sexual Activity  . Sexual activity: Not on file    Outpatient Encounter Prescriptions as of 12/19/2016  Medication Sig  . amLODipine (NORVASC) 5 MG tablet TAKE ONE (1) TABLET EACH DAY  . aspirin 81 MG tablet Take 81 mg by mouth daily.  . cetirizine (ZYRTEC) 10 MG tablet Take 10 mg by mouth daily.  . Diphenhydramine-APAP, sleep, (TYLENOL PM EXTRA STRENGTH PO) Take 1 tablet by mouth at bedtime as needed.  . dorzolamide (TRUSOPT) 2 % ophthalmic solution 1 drop 2 (two) times daily.  Marland Kitchen glipiZIDE-metformin (METAGLIP) 2.5-500 MG tablet TAKE ONE TABLET EVERY MORNING  . ibandronate (BONIVA) 150 MG tablet TAKE ONE TABLET ONCE A MONTH FIRST THINGIN THE MORNING AT LEAST 1 HOUR BEFORE EATING, TAKE WITH WATER. DON'T LIE DOWN FOR 30 MINUTES.  Marland Kitchen latanoprost (XALATAN) 0.005 % ophthalmic solution 1 drop at bedtime.  Marland Kitchen levothyroxine (SYNTHROID, LEVOTHROID) 100 MCG tablet TAKE ONE (1) TABLET BY MOUTH EVERY DAY  . loperamide (IMODIUM) 2 MG capsule Take 2 mg by mouth as needed for diarrhea or loose stools.  . lovastatin (MEVACOR) 40 MG tablet TAKE ONE TABLET BY MOUTH EVERY NIGHT AT BEDTIME  . nortriptyline (PAMELOR) 25 MG capsule TAKE ONE CAPSULE BY MOUTH AT  BEDTIME  . omeprazole (PRILOSEC) 40 MG capsule Take 1 capsule (40 mg total) by mouth daily.  . ONE TOUCH ULTRA TEST test strip TEST TWICE DAILY  . Polyethylene Glycol 3350 (DULCOLAX BALANCE PO) Take 2 tablets by mouth as needed.  Marland Kitchen SPIRIVA RESPIMAT 2.5 MCG/ACT AERS 2 PUFFS EVERY DAY  . traMADol (ULTRAM) 50 MG tablet Take 1 tablet (50 mg total) by mouth every 8 (eight) hours as needed.  . valsartan-hydrochlorothiazide (DIOVAN-HCT) 160-25 MG tablet Take 1 tablet by mouth daily.  Marland Kitchen albuterol (PROVENTIL HFA;VENTOLIN HFA) 108 (90 BASE) MCG/ACT inhaler Inhale into the lungs every 6 (six) hours as needed for wheezing or shortness of breath.  . naproxen sodium (ANAPROX) 220 MG tablet Take 220 mg by mouth. As needed  . [DISCONTINUED]  Bismuth Subsalicylate (BISMUTH PO) Take 2 tablets by mouth as needed.  . [DISCONTINUED] furosemide (LASIX) 20 MG tablet TAKE ONE (1) TABLET BY MOUTH EVERY DAY FOR SWELLING (Patient not taking: Reported on 10/03/2016)   No facility-administered encounter medications on file as of 12/19/2016.     Activities of Daily Living In your present state of health, do you have any difficulty performing the following activities: 12/19/2016 06/27/2016  Hearing? Tempie Donning  Vision? Y Y  Difficulty concentrating or making decisions? Tempie Donning  Walking or climbing stairs? Y Y  Dressing or bathing? N N  Doing errands, shopping? N N  Preparing Food and eating ? N -  Using the Toilet? N -  In the past six months, have you accidently leaked urine? Y -  Do you have problems with loss of bowel control? Y -  Managing your Medications? N -  Managing your Finances? N -  Housekeeping or managing your Housekeeping? N -  Some recent data might be hidden    Patient Care Team: Birdie Sons, MD as PCP - General (Family Medicine) Dingeldein, Remo Lipps, MD as Consulting Physician (Ophthalmology)    Assessment:     Exercise Activities and Dietary recommendations Current Exercise Habits: Home exercise routine, Type of exercise: stretching, Time (Minutes): 10, Frequency (Times/Week): 4, Weekly Exercise (Minutes/Week): 40, Intensity: Mild, Exercise limited by: orthopedic condition(s)  Goals    . Increase water intake          Recommend increasing water intake to 6 glasses a day. Also substituting a class of tea for water.       Fall Risk Fall Risk  12/19/2016 06/27/2016 04/28/2015 04/28/2015  Falls in the past year? No No No No   Depression Screen PHQ 2/9 Scores 12/19/2016 06/27/2016 04/28/2015 04/28/2015  PHQ - 2 Score 2 2 0 0  PHQ- 9 Score 8 6 - -     Cognitive Function     6CIT Screen 12/19/2016  What Year? 0 points  What month? 0 points  What time? 0 points  Count back from 20 0 points  Months in reverse 0 points    Repeat phrase 6 points  Total Score 6    Immunization History  Administered Date(s) Administered  . Influenza, High Dose Seasonal PF 04/28/2015, 04/20/2016  . Pneumococcal Conjugate-13 11/26/2013  . Pneumococcal Polysaccharide-23 05/16/2003  . Td 04/02/1995  . Zoster 07/20/2011   Screening Tests Health Maintenance  Topic Date Due  . FOOT EXAM  03/19/1939  . TETANUS/TDAP  07/17/2026 (Originally 04/01/2005)  . HEMOGLOBIN A1C  12/27/2016  . INFLUENZA VACCINE  02/14/2017  . OPHTHALMOLOGY EXAM  12/12/2017  . DEXA SCAN  Completed  . PNA vac Low Risk Adult  Completed      Plan:  I have personally reviewed and addressed the Medicare Annual Wellness questionnaire and have noted the following in the patient's chart:  A. Medical and social history B. Use of alcohol, tobacco or illicit drugs  C. Current medications and supplements D. Functional ability and status E.  Nutritional status F.  Physical activity G. Advance directives H. List of other physicians I.  Hospitalizations, surgeries, and ER visits in previous 12 months J.  [place] such as hearing and vision if needed, cognitive and depression L. Referrals and appointments - none  In addition, I have reviewed and discussed with patient certain preventive protocols, quality metrics, and best practice recommendations. A written personalized care plan for preventive services as well as general preventive health recommendations were provided to patient.  See attached scanned questionnaire for additional information.   Signed,  Fabio Neighbors, LPN Nurse Health Advisor   MD Recommendations: Pt needs a diabetic foot exam at next [place] on 01/02/17. Pt declined tetanus vaccine today.

## 2016-12-25 ENCOUNTER — Other Ambulatory Visit: Payer: Self-pay | Admitting: Family Medicine

## 2016-12-25 DIAGNOSIS — M1990 Unspecified osteoarthritis, unspecified site: Secondary | ICD-10-CM

## 2016-12-25 NOTE — Telephone Encounter (Signed)
Please call in tramadol.  

## 2016-12-26 NOTE — Telephone Encounter (Signed)
Rx called in to pharmacy. 

## 2017-01-02 ENCOUNTER — Encounter: Payer: Self-pay | Admitting: Family Medicine

## 2017-01-02 ENCOUNTER — Ambulatory Visit (INDEPENDENT_AMBULATORY_CARE_PROVIDER_SITE_OTHER): Payer: PPO | Admitting: Family Medicine

## 2017-01-02 VITALS — BP 130/60 | HR 74 | Temp 98.1°F | Resp 16 | Wt 131.0 lb

## 2017-01-02 DIAGNOSIS — J209 Acute bronchitis, unspecified: Secondary | ICD-10-CM | POA: Diagnosis not present

## 2017-01-02 DIAGNOSIS — E1140 Type 2 diabetes mellitus with diabetic neuropathy, unspecified: Secondary | ICD-10-CM | POA: Diagnosis not present

## 2017-01-02 DIAGNOSIS — R200 Anesthesia of skin: Secondary | ICD-10-CM

## 2017-01-02 DIAGNOSIS — I10 Essential (primary) hypertension: Secondary | ICD-10-CM

## 2017-01-02 DIAGNOSIS — R202 Paresthesia of skin: Secondary | ICD-10-CM

## 2017-01-02 DIAGNOSIS — J449 Chronic obstructive pulmonary disease, unspecified: Secondary | ICD-10-CM

## 2017-01-02 DIAGNOSIS — E118 Type 2 diabetes mellitus with unspecified complications: Secondary | ICD-10-CM | POA: Diagnosis not present

## 2017-01-02 DIAGNOSIS — J440 Chronic obstructive pulmonary disease with acute lower respiratory infection: Secondary | ICD-10-CM

## 2017-01-02 LAB — POCT UA - MICROALBUMIN: Microalbumin Ur, POC: 20 mg/L

## 2017-01-02 LAB — POCT GLYCOSYLATED HEMOGLOBIN (HGB A1C)
ESTIMATED AVERAGE GLUCOSE: 114
Hemoglobin A1C: 5.6

## 2017-01-02 MED ORDER — AZITHROMYCIN 250 MG PO TABS: ORAL_TABLET | ORAL | 0 refills | Status: AC

## 2017-01-02 NOTE — Progress Notes (Signed)
Patient: Robyn Butler Female    DOB: 06-11-1929   83 y.o.   MRN: [id number] Visit Date: 01/02/2017  Today's Provider: Lelon Huh, MD   Chief Complaint  Patient presents with  . Diabetes    follow up   . Hypertension    follow up   . Hypothyroidism    follow up    Subjective:    HPI  Diabetes Mellitus Type II, Follow-up:   Lab Results  Component Value Date   HGBA1C 6.0 06/28/2016   HGBA1C 6.2 03/02/2016   HGBA1C 6.3 12/02/2015    Last seen for diabetes 6 months ago.  Management since then includes no changes. She reports good compliance with treatment. She is not having side effects.  Current symptoms include polyuria and visual disturbances and have been stable. Home blood sugar records: fasting range: 160-230  Episodes of hypoglycemia? no   Current Insulin Regimen: none Most Recent Eye Exam: 11/2016 Weight trend: stable Prior visit with dietician: no Current diet: in general, an "unhealthy" diet Current exercise: none  Pertinent Labs:    Component Value Date/Time   CHOL 139 12/02/2015 1151   CHOL 96 11/10/2012 0512   TRIG 138 12/02/2015 1151   TRIG 68 11/10/2012 0512   HDL 42 12/02/2015 1151   HDL 36 (L) 11/10/2012 0512   LDLCALC 69 12/02/2015 1151   LDLCALC 46 11/10/2012 0512   CREATININE 1.48 (H) 06/27/2016 1203   CREATININE 1.10 11/10/2012 0512    Wt Readings from Last 3 Encounters:  12/19/16 132 lb 12.8 oz (60.2 kg)  10/03/16 134 lb (60.8 kg)  08/14/16 134 lb (60.8 kg)    ------------------------------------------------------------------------  Hypertension, follow-up:  BP Readings from Last 3 Encounters:  12/19/16 (!) 136/48  10/03/16 (!) 164/62  08/14/16 (!) 142/72    She was last seen for hypertension 3 months ago.  BP at that visit was 164/62. Management since that visit includes increasing Valsartan -HCTZ to 160-25mg . She reports good compliance with treatment. She is not having side effects.  She is not  exercising. She is not adherent to low salt diet.   Outside blood pressures are averaging 130-140 /60-70. She is experiencing chest pressure/discomfort, dyspnea, fatigue and lower extremity edema.  Patient denies claudication, irregular heart beat, palpitations, paroxysmal nocturnal dyspnea, syncope and tachypnea.   Cardiovascular risk factors include advanced age (older than 43 for men, 92 for women), diabetes mellitus and hypertension.  Use of agents associated with hypertension: NSAIDS.     Weight trend: stable Wt Readings from Last 3 Encounters:  12/19/16 132 lb 12.8 oz (60.2 kg)  10/03/16 134 lb (60.8 kg)  08/14/16 134 lb (60.8 kg)    Current diet: in general, an "unhealthy" diet  ------------------------------------------------------------------------ Follow up of Hypothyroidism:  Patient was last seen for this problem 6 months ago and no changes were made. Patient reports good compliance with treatment and good tolerance.  Lab Results  Component Value Date   TSH 1.100 06/27/2016       Allergies  Allergen Reactions  . Alendronate     Other reaction(s): Vomiting     Current Outpatient Prescriptions:  .  amLODipine (NORVASC) 5 MG tablet, TAKE ONE (1) TABLET EACH DAY, Disp: 90 tablet, Rfl: 4 .  aspirin 81 MG tablet, Take 81 mg by mouth daily., Disp: , Rfl:  .  cetirizine (ZYRTEC) 10 MG tablet, Take 10 mg by mouth daily., Disp: , Rfl:  .  Diphenhydramine-APAP, sleep, (TYLENOL PM EXTRA  STRENGTH PO), Take 1 tablet by mouth at bedtime as needed., Disp: , Rfl:  .  dorzolamide (TRUSOPT) 2 % ophthalmic solution, 1 drop 2 (two) times daily., Disp: , Rfl:  .  glipiZIDE-metformin (METAGLIP) 2.5-500 MG tablet, TAKE ONE TABLET EVERY MORNING, Disp: 90 tablet, Rfl: 4 .  ibandronate (BONIVA) 150 MG tablet, TAKE ONE TABLET ONCE A MONTH FIRST THINGIN THE MORNING AT LEAST 1 HOUR BEFORE EATING, TAKE WITH WATER. DON'T LIE DOWN FOR 30 MINUTES., Disp: 1 tablet, Rfl: 12 .  latanoprost  (XALATAN) 0.005 % ophthalmic solution, 1 drop at bedtime., Disp: , Rfl:  .  levothyroxine (SYNTHROID, LEVOTHROID) 100 MCG tablet, TAKE ONE (1) TABLET BY MOUTH EVERY DAY, Disp: 90 tablet, Rfl: 4 .  loperamide (IMODIUM) 2 MG capsule, Take 2 mg by mouth as needed for diarrhea or loose stools., Disp: , Rfl:  .  lovastatin (MEVACOR) 40 MG tablet, TAKE ONE TABLET BY MOUTH EVERY NIGHT AT BEDTIME, Disp: 90 tablet, Rfl: 4 .  naproxen sodium (ANAPROX) 220 MG tablet, Take 220 mg by mouth. As needed, Disp: , Rfl:  .  nortriptyline (PAMELOR) 25 MG capsule, TAKE ONE CAPSULE BY MOUTH AT BEDTIME, Disp: 30 capsule, Rfl: 12 .  omeprazole (PRILOSEC) 40 MG capsule, Take 1 capsule (40 mg total) by mouth daily., Disp: 30 capsule, Rfl: 12 .  ONE TOUCH ULTRA TEST test strip, TEST TWICE DAILY, Disp: 100 each, Rfl: 5 .  Polyethylene Glycol 3350 (DULCOLAX BALANCE PO), Take 2 tablets by mouth as needed., Disp: , Rfl:  .  SPIRIVA RESPIMAT 2.5 MCG/ACT AERS, 2 PUFFS EVERY DAY, Disp: 1 Inhaler, Rfl: 12 .  traMADol (ULTRAM) 50 MG tablet, TAKE ONE TABLET EVERY 8 HOURS AS NEEDED, Disp: 30 tablet, Rfl: 4 .  valsartan-hydrochlorothiazide (DIOVAN-HCT) 160-25 MG tablet, Take 1 tablet by mouth daily., Disp: 30 tablet, Rfl: 3  Review of Systems  Constitutional: Positive for fatigue. Negative for appetite change, chills and fever.  Respiratory: Positive for cough (dry cough) and chest tightness. Negative for shortness of breath.   Cardiovascular: Negative for chest pain and palpitations.  Gastrointestinal: Negative for abdominal pain, nausea and vomiting.  Endocrine: Positive for polyuria.  Neurological: Negative for dizziness and weakness.    Social History  Substance Use Topics  . Smoking status: Former Smoker    Quit date: 07/18/1991  . Smokeless tobacco: Never Used  . Alcohol use No   Objective:   BP 130/60 (BP Location: Left Arm, Patient Position: Sitting, Cuff Size: Normal)   Pulse 74   Temp 98.1 F (36.7 C) (Oral)    Resp 16   Wt 131 lb (59.4 kg)   SpO2 95% Comment: room air  BMI 23.96 kg/m  There were no vitals filed for this visit.   Physical Exam  General Appearance:    Alert, cooperative, no distress  HENT:   ENT exam normal, no neck nodes or sinus tenderness  Eyes:    PERRL, conjunctiva/corneas clear, EOM's intact       Lungs:     Clear to auscultation bilaterally, respirations unlabored  Heart:    Regular rate and rhythm  Neurologic:   Awake, alert, oriented x 3. No apparent focal neurological           defect.       Diabetic Foot Exam - Simple   Simple Foot Form Diabetic Foot exam was performed with the following findings:  Yes 01/02/2017 11:02 AM  Visual Inspection No deformities, no ulcerations, no other skin breakdown bilaterally:  Yes Sensation Testing See comments:  Yes Pulse Check Posterior Tibialis and Dorsalis pulse intact bilaterally:  Yes Comments Diminished s/s plantar surfaces of both feet       Results for orders placed or performed in visit on 01/02/17  POCT HgB A1C  Result Value Ref Range   Hemoglobin A1C 5.6    Est. average glucose Bld gHb Est-mCnc 114   POCT UA - Microalbumin  Result Value Ref Range   Microalbumin Ur, POC 20 mg/L   Creatinine, POC n/a mg/dL   Albumin/Creatinine Ratio, Urine, POC n/a        Assessment & Plan:     1. Type 2 diabetes mellitus with diabetic neuropathy, without long-term current use of insulin (HCC) Well controlled.  Continue current medications.   - POCT HgB A1C - POCT UA - Microalbumin - Lipid panel  2. Essential (primary) hypertension Well controlled.  Continue current medications.   - Renal function panel  3. Acute bronchitis with COPD ([place])  - azithromycin (ZITHROMAX) 250 MG tablet; 2 by mouth today, then 1 daily for 4 days  Dispense: 6 tablet; Refill: 0 Call if symptoms change or if not rapidly improving.    4. Chronic obstructive pulmonary disease, unspecified COPD type ([place]) Stable on Spiriva.   5.  Numbness and tingling of both feet Likely due to diabetic neuropathy, is on metformin.  - Vitamin B12       Lelon Huh, MD  [facility]

## 2017-01-03 LAB — RENAL FUNCTION PANEL
ALBUMIN: 4.9 g/dL — AB (ref 3.5–4.7)
BUN / CREAT RATIO: 20 (ref 12–28)
BUN: 25 mg/dL (ref 8–27)
CO2: 24 mmol/L (ref 20–29)
Calcium: 9.8 mg/dL (ref 8.7–10.3)
Chloride: 105 mmol/L (ref 96–106)
Creatinine, Ser: 1.26 mg/dL — ABNORMAL HIGH (ref 0.57–1.00)
GFR, EST AFRICAN AMERICAN: 45 mL/min/{1.73_m2} — AB (ref >59)
GFR, EST NON AFRICAN AMERICAN: 39 mL/min/{1.73_m2} — AB (ref >59)
GLUCOSE: 94 mg/dL (ref 65–99)
POTASSIUM: 4.6 mmol/L (ref 3.5–5.2)
Phosphorus: 3.9 mg/dL (ref 2.5–4.5)
Sodium: 144 mmol/L (ref 134–144)

## 2017-01-03 LAB — LIPID PANEL
CHOL/HDL RATIO: 3.2 ratio (ref 0.0–4.4)
Cholesterol, Total: 127 mg/dL (ref 100–199)
HDL: 40 mg/dL (ref >39)
LDL Calculated: 63 mg/dL (ref 0–99)
Triglycerides: 120 mg/dL (ref 0–149)
VLDL Cholesterol Cal: 24 mg/dL (ref 5–40)

## 2017-01-03 LAB — VITAMIN B12: VITAMIN B 12: 430 pg/mL (ref 232–1245)

## 2017-01-10 ENCOUNTER — Other Ambulatory Visit: Payer: Self-pay | Admitting: Family Medicine

## 2017-02-02 ENCOUNTER — Other Ambulatory Visit: Payer: Self-pay | Admitting: Family Medicine

## 2017-02-03 ENCOUNTER — Other Ambulatory Visit: Payer: Self-pay | Admitting: Family Medicine

## 2017-04-23 ENCOUNTER — Telehealth: Payer: Self-pay | Admitting: Family Medicine

## 2017-04-23 NOTE — Telephone Encounter (Signed)
Pt is wanting to see if she is eligible for the pneumonia shot at the time she gets her flu shot on 05/10/17.  Please advise patient.

## 2017-04-24 NOTE — Telephone Encounter (Signed)
Patient notified she is up to date on pneumonia vaccines.

## 2017-05-10 ENCOUNTER — Ambulatory Visit (INDEPENDENT_AMBULATORY_CARE_PROVIDER_SITE_OTHER): Payer: PPO

## 2017-05-10 DIAGNOSIS — Z23 Encounter for immunization: Secondary | ICD-10-CM

## 2017-05-11 ENCOUNTER — Other Ambulatory Visit: Payer: Self-pay | Admitting: Family Medicine

## 2017-05-11 MED ORDER — TIOTROPIUM BROMIDE MONOHYDRATE 2.5 MCG/ACT IN AERS: 2.0000 | INHALATION_SPRAY | Freq: Every day | RESPIRATORY_TRACT | 4 refills | Status: DC

## 2017-05-21 ENCOUNTER — Other Ambulatory Visit: Payer: Self-pay | Admitting: Family Medicine

## 2017-05-21 NOTE — Telephone Encounter (Signed)
Pharmacy is requesting refills. Thanks!

## 2017-05-23 IMAGING — CR DG LUMBAR SPINE COMPLETE 4+V
1 series · 5 of 5 positions shown · non-contrast
Comparison: CT scanogram 11/08/2012 (no formats on this abdominal
CT.

CLINICAL DATA: Low back pain

EXAM:
LUMBAR SPINE - COMPLETE 4+ VIEW

[Series 1: dg lumbar spine complete 4 +v · 0.14mm/px · 5 of 5 slices shown]
[im 1/5]
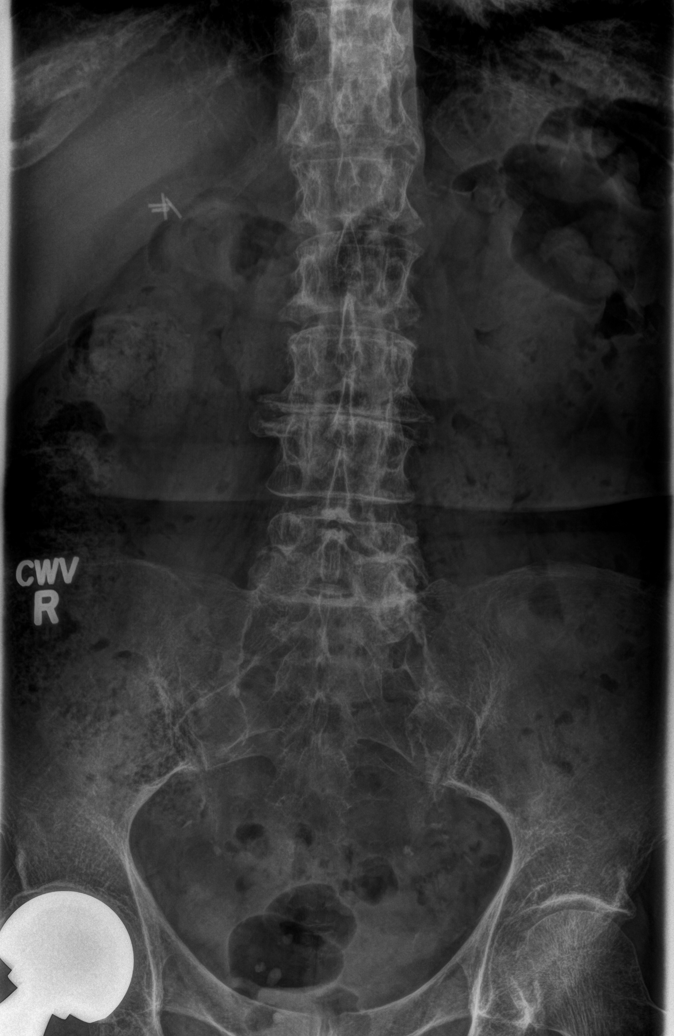
[im 2/5]
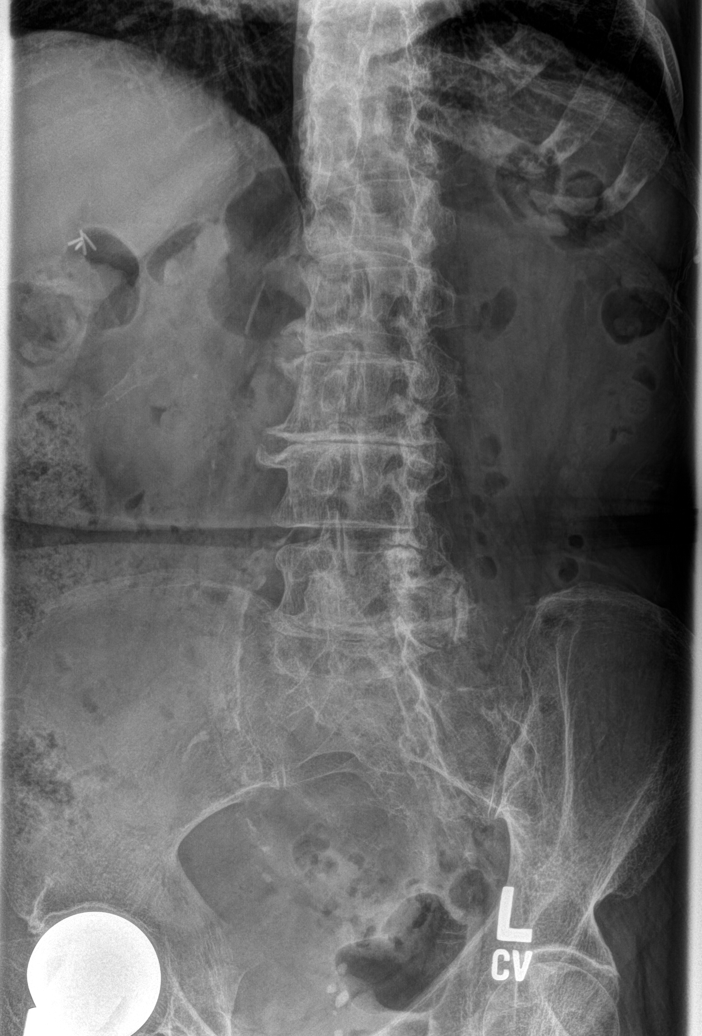
[im 3/5]
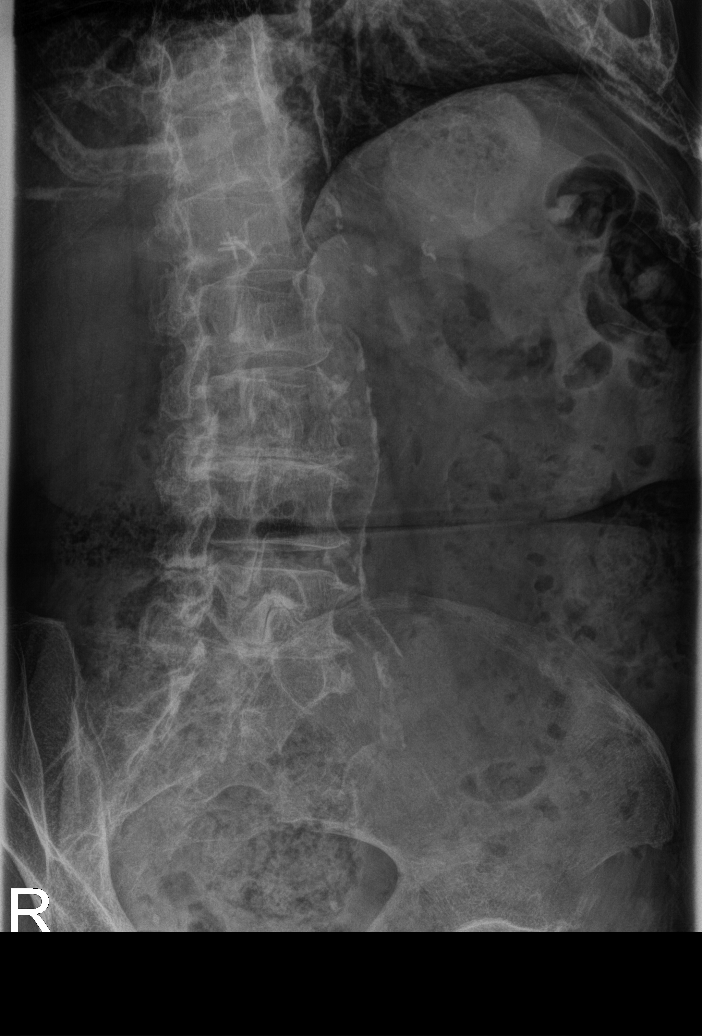
[im 4/5]
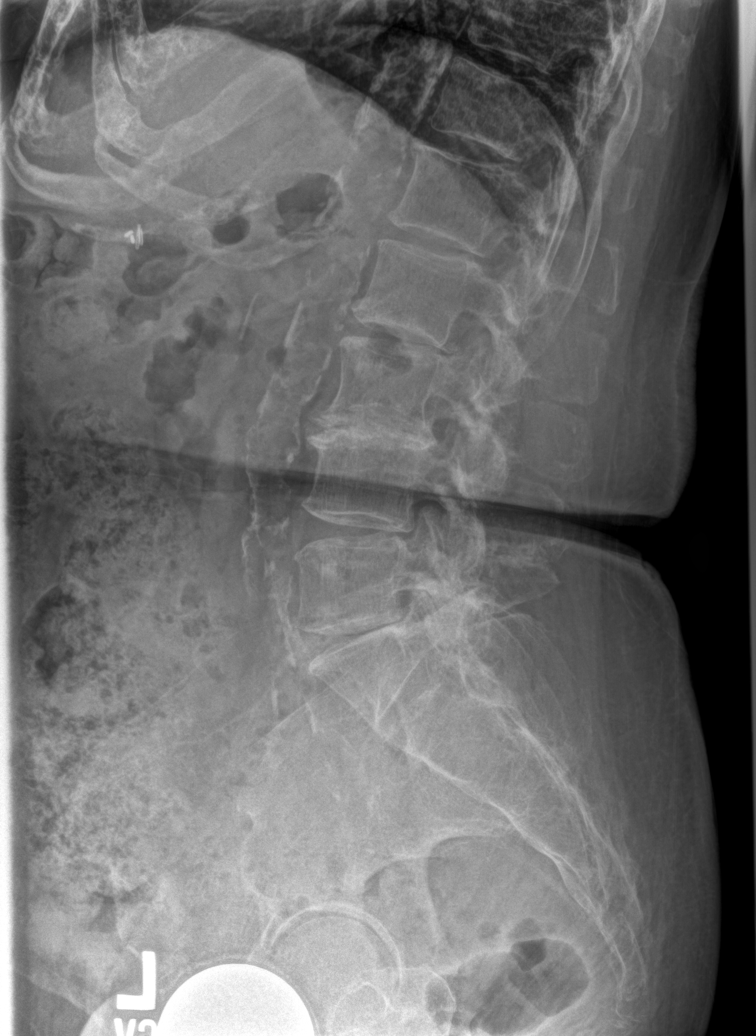
[im 5/5]
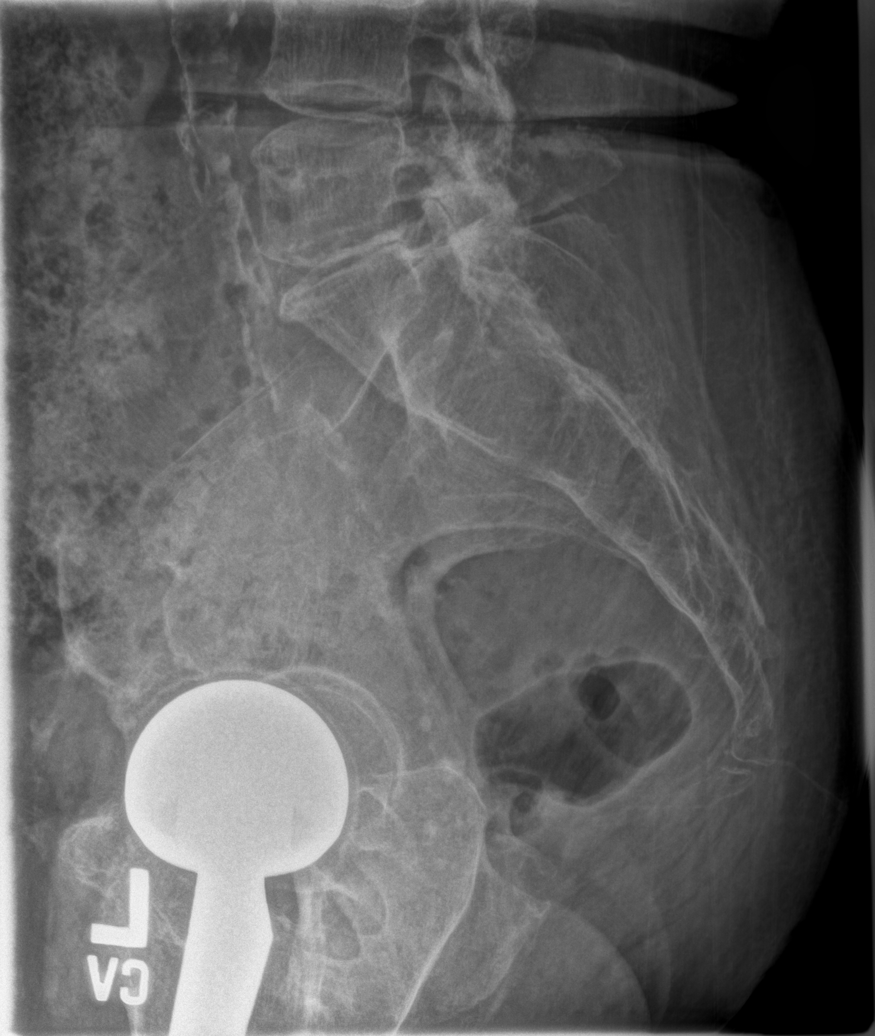

[5 of 5 positions shown; findings below may reference images not displayed]

FINDINGS: There is no evidence of lumbar spine fracture or traumatic
malalignment.

Degenerative disease, focally advanced at L3-4 where there is
bone-on-bone contact. There is also facet arthritis, greatest at
L5-S1. Interspinous degenerative irregularity and sclerosis at L4-5.
No evidence of focal bone lesion or endplate erosion.

Diffuse atherosclerosis.  Prominent stool volume.
IMPRESSION: 1. No acute finding.
2. Advanced degenerative disc disease at L3-4. Lower lumbar facet
and interspinous arthropathy.

## 2017-06-06 DIAGNOSIS — H401131 Primary open-angle glaucoma, bilateral, mild stage: Secondary | ICD-10-CM | POA: Diagnosis not present

## 2017-06-13 ENCOUNTER — Encounter: Payer: Self-pay | Admitting: Family Medicine

## 2017-06-13 ENCOUNTER — Ambulatory Visit: Payer: PPO | Admitting: Family Medicine

## 2017-06-13 VITALS — BP 130/44 | HR 75 | Temp 97.8°F | Resp 16 | Wt 131.0 lb

## 2017-06-13 DIAGNOSIS — J440 Chronic obstructive pulmonary disease with acute lower respiratory infection: Secondary | ICD-10-CM

## 2017-06-13 DIAGNOSIS — J209 Acute bronchitis, unspecified: Secondary | ICD-10-CM

## 2017-06-13 MED ORDER — AZITHROMYCIN 250 MG PO TABS: ORAL_TABLET | ORAL | 0 refills | Status: AC

## 2017-06-13 NOTE — Progress Notes (Signed)
Patient: Robyn Butler Female    DOB: [birth year]   83 y.o.   MRN: [id number] Visit Date: 06/13/2017  Today's Provider: Lelon Huh, MD   Chief Complaint  Patient presents with  . URI   Subjective:    Cough  This is a new problem. Episode onset: 2 days ago. The problem has been gradually worsening. The cough is non-productive. Associated symptoms include chills, postnasal drip, rhinorrhea, a sore throat (scratchy throat) and shortness of breath. Pertinent negatives include no chest pain, ear congestion, ear pain, fever, headaches, hemoptysis, myalgias, nasal congestion or wheezing. Treatments tried: Inhalers. The treatment provided no relief.       Allergies  Allergen Reactions  . Alendronate     Other reaction(s): Vomiting     Current Outpatient Medications:  .  amLODipine (NORVASC) 5 MG tablet, TAKE ONE (1) TABLET EACH DAY, Disp: 90 tablet, Rfl: 4 .  aspirin 81 MG tablet, Take 81 mg by mouth daily., Disp: , Rfl:  .  cetirizine (ZYRTEC) 10 MG tablet, Take 10 mg by mouth daily., Disp: , Rfl:  .  Diphenhydramine-APAP, sleep, (TYLENOL PM EXTRA STRENGTH PO), Take 1 tablet by mouth at bedtime as needed., Disp: , Rfl:  .  dorzolamide (TRUSOPT) 2 % ophthalmic solution, 1 drop 2 (two) times daily., Disp: , Rfl:  .  glipiZIDE-metformin (METAGLIP) 2.5-500 MG tablet, TAKE ONE TABLET EVERY MORNING, Disp: 90 tablet, Rfl: 4 .  ibandronate (BONIVA) 150 MG tablet, TAKE ONE TABLET ONCE A MONTH FIRST THING IN THE MORNING AT LEAST 1 HOUR BEFORE EATING, TAKE WITH WAT, Disp: 1 tablet, Rfl: 12 .  latanoprost (XALATAN) 0.005 % ophthalmic solution, 1 drop at bedtime., Disp: , Rfl:  .  levothyroxine (SYNTHROID, LEVOTHROID) 100 MCG tablet, TAKE ONE (1) TABLET BY MOUTH EVERY DAY, Disp: 90 tablet, Rfl: 4 .  loperamide (IMODIUM) 2 MG capsule, Take 2 mg by mouth as needed for diarrhea or loose stools., Disp: , Rfl:  .  lovastatin (MEVACOR) 40 MG tablet, TAKE ONE (1) TABLET AT BEDTIME, Disp: 90  tablet, Rfl: 2 .  naproxen sodium (ANAPROX) 220 MG tablet, Take 220 mg by mouth. As needed, Disp: , Rfl:  .  nortriptyline (PAMELOR) 25 MG capsule, TAKE ONE CAPSULE BY MOUTH AT BEDTIME, Disp: 30 capsule, Rfl: 12 .  omeprazole (PRILOSEC) 40 MG capsule, Take 1 capsule (40 mg total) by mouth daily., Disp: 30 capsule, Rfl: 12 .  ONE TOUCH ULTRA TEST test strip, TEST TWICE DAILY, Disp: 100 each, Rfl: 5 .  Polyethylene Glycol 3350 (DULCOLAX BALANCE PO), Take 2 tablets by mouth as needed., Disp: , Rfl:  .  Tiotropium Bromide Monohydrate (SPIRIVA RESPIMAT) 2.5 MCG/ACT AERS, Inhale 2 puffs into the lungs daily., Disp: 3 Inhaler, Rfl: 4 .  traMADol (ULTRAM) 50 MG tablet, TAKE ONE TABLET EVERY 8 HOURS AS NEEDED, Disp: 30 tablet, Rfl: 4 .  valsartan-hydrochlorothiazide (DIOVAN-HCT) 160-25 MG tablet, Take 1 tablet by mouth daily., Disp: 30 tablet, Rfl: 4  Review of Systems  Constitutional: Positive for chills and fatigue. Negative for appetite change, diaphoresis and fever.  HENT: Positive for postnasal drip, rhinorrhea, sore throat (scratchy throat) and voice change. Negative for ear pain, sinus pressure and sinus pain.   Respiratory: Positive for cough and shortness of breath. Negative for hemoptysis, chest tightness and wheezing.   Cardiovascular: Negative for chest pain and palpitations.  Gastrointestinal: Negative for abdominal pain, nausea and vomiting.  Musculoskeletal: Negative for myalgias.  Neurological: Positive for dizziness.  Negative for weakness and headaches.    Social History   Tobacco Use  . Smoking status: Former Smoker    Last attempt to quit: 07/18/1991    Years since quitting: 25.9  . Smokeless tobacco: Never Used  Substance Use Topics  . Alcohol use: No   Objective:   BP (!) 130/44 (BP Location: Right Arm, Cuff Size: Normal)   Pulse 75   Temp 97.8 F (36.6 C) (Oral)   Resp 16   Wt 131 lb (59.4 kg)   SpO2 96% Comment: room air  BMI 23.96 kg/m  Vitals:   06/13/17 0941  06/13/17 0946  BP: (!) 130/46 (!) 130/44  Pulse: 75   Resp: 16   Temp: 97.8 F (36.6 C)   TempSrc: Oral   SpO2: 96%   Weight: 131 lb (59.4 kg)      Physical Exam  General Appearance:    Alert, cooperative, no distress  HENT:   ENT exam normal, no neck nodes or sinus tenderness  Eyes:    PERRL, conjunctiva/corneas clear, EOM's intact       Lungs:     Occasional expiratory wheeze, no rales, , respirations unlabored  Heart:    Regular rate and rhythm  Neurologic:   Awake, alert, oriented x 3. No apparent focal neurological           defect.           Assessment & Plan:     1. Acute bronchitis with COPD ([place])  - azithromycin (ZITHROMAX) 250 MG tablet; 2 by mouth today, then 1 daily for 4 days  Dispense: 6 tablet; Refill: 0  Call if symptoms change or if not rapidly improving.    The entirety of the information documented in the History of Present Illness, Review of Systems and Physical Exam were personally obtained by me. Portions of this information were initially documented by Meyer Cory, CMA and reviewed by me for thoroughness and accuracy.        Lelon Huh, MD  [facility]

## 2017-06-14 DIAGNOSIS — H401131 Primary open-angle glaucoma, bilateral, mild stage: Secondary | ICD-10-CM | POA: Diagnosis not present

## 2017-06-14 LAB — HM DIABETES EYE EXAM

## 2017-06-29 ENCOUNTER — Other Ambulatory Visit: Payer: Self-pay | Admitting: Family Medicine

## 2017-06-29 DIAGNOSIS — K219 Gastro-esophageal reflux disease without esophagitis: Secondary | ICD-10-CM

## 2017-06-29 MED ORDER — OMEPRAZOLE 40 MG PO CPDR: 40.0000 mg | DELAYED_RELEASE_CAPSULE | Freq: Every day | ORAL | 11 refills | Status: DC

## 2017-06-29 MED ORDER — VALSARTAN-HYDROCHLOROTHIAZIDE 160-25 MG PO TABS: 1.0000 | ORAL_TABLET | Freq: Every day | ORAL | 11 refills | Status: DC

## 2017-06-29 NOTE — Telephone Encounter (Signed)
Robyn Butler faxed refill request for   valsartan-hydrochlorothiazide (DIOVAN-HCT) 160-25 MG tablet  Qty: 30 Refills: 4  omeprazole (PRILOSEC) 40 MG capsule Qty: 30 Refills:12

## 2017-07-03 ENCOUNTER — Ambulatory Visit: Payer: PPO | Admitting: Family Medicine

## 2017-07-03 ENCOUNTER — Encounter: Payer: Self-pay | Admitting: Family Medicine

## 2017-07-03 VITALS — BP 140/60 | HR 77 | Temp 97.8°F | Resp 16 | Wt 131.0 lb

## 2017-07-03 DIAGNOSIS — M25551 Pain in right hip: Secondary | ICD-10-CM

## 2017-07-03 DIAGNOSIS — Z8781 Personal history of (healed) traumatic fracture: Secondary | ICD-10-CM | POA: Diagnosis not present

## 2017-07-03 DIAGNOSIS — E118 Type 2 diabetes mellitus with unspecified complications: Secondary | ICD-10-CM | POA: Diagnosis not present

## 2017-07-03 DIAGNOSIS — M169 Osteoarthritis of hip, unspecified: Secondary | ICD-10-CM | POA: Diagnosis not present

## 2017-07-03 DIAGNOSIS — J45909 Unspecified asthma, uncomplicated: Secondary | ICD-10-CM

## 2017-07-03 DIAGNOSIS — M810 Age-related osteoporosis without current pathological fracture: Secondary | ICD-10-CM | POA: Diagnosis not present

## 2017-07-03 DIAGNOSIS — J449 Chronic obstructive pulmonary disease, unspecified: Secondary | ICD-10-CM

## 2017-07-03 DIAGNOSIS — I10 Essential (primary) hypertension: Secondary | ICD-10-CM | POA: Diagnosis not present

## 2017-07-03 DIAGNOSIS — G8929 Other chronic pain: Secondary | ICD-10-CM

## 2017-07-03 DIAGNOSIS — N183 Chronic kidney disease, stage 3 unspecified: Secondary | ICD-10-CM

## 2017-07-03 DIAGNOSIS — E1149 Type 2 diabetes mellitus with other diabetic neurological complication: Secondary | ICD-10-CM | POA: Diagnosis not present

## 2017-07-03 DIAGNOSIS — M1990 Unspecified osteoarthritis, unspecified site: Secondary | ICD-10-CM | POA: Diagnosis not present

## 2017-07-03 LAB — POCT GLYCOSYLATED HEMOGLOBIN (HGB A1C)
Est. average glucose Bld gHb Est-mCnc: 126
Hemoglobin A1C: 6

## 2017-07-03 MED ORDER — MONTELUKAST SODIUM 10 MG PO TABS: 10.0000 mg | ORAL_TABLET | Freq: Every day | ORAL | 2 refills | Status: DC

## 2017-07-03 MED ORDER — TRAMADOL HCL 50 MG PO TABS: ORAL_TABLET | ORAL | 2 refills | Status: DC

## 2017-07-03 NOTE — Progress Notes (Signed)
Patient: Robyn Butler Female    DOB: 28-Jan-1929   83 y.o.   MRN: [id number] Visit Date: 07/03/2017  Today's Provider: Lelon Huh, MD   Chief Complaint  Patient presents with  . Hypertension    follow up  . COPD    follow up  . Diabetes    follow up   Subjective:    HPI   Diabetes Mellitus Type II, Follow-up:   Lab Results  Component Value Date   HGBA1C 5.6 01/02/2017   HGBA1C 6.0 06/28/2016   HGBA1C 6.2 03/02/2016   Last seen for diabetes 6 months ago.  Management since then includes; labs checked, no changes. She reports good compliance with treatment. She is not having side effects.  Current symptoms include none and have been stable. Home blood sugar records: fasting range: 180-205  Episodes of hypoglycemia? no   Current Insulin Regimen: none Most Recent Eye Exam: 06/2017 Weight trend: stable Prior visit with dietician: no Current diet: in general, an "unhealthy" diet Current exercise: none  ------------------------------------------------------------------------   Hypertension, follow-up:  BP Readings from Last 3 Encounters:  06/13/17 (!) 130/44  01/02/17 130/60  12/19/16 (!) 136/48    She was last seen for hypertension 6 months ago.  BP at that visit was 130/60. Management since that visit includes; labs checked, no changes.She reports good compliance with treatment. She is not having side effects.  She is not exercising. She is not adherent to low salt diet.   Outside blood pressures are checked at home and average 150/60. She is experiencing none.  Patient denies chest pain, chest pressure/discomfort, claudication, dyspnea, exertional chest pressure/discomfort, fatigue, irregular heart beat, lower extremity edema, near-syncope, orthopnea, palpitations, paroxysmal nocturnal dyspnea, syncope and tachypnea.   Cardiovascular risk factors include advanced age (older than 35 for men, 90 for women), diabetes mellitus and hypertension.  Use  of agents associated with hypertension: NSAIDS.   ------------------------------------------------------------------------  Chronic obstructive pulmonary disease, unspecified COPD type ([place]) From 01/02/2017-no changes. Stable on Spiriva. Patient reports good compliance with treatment, good compliance and fair symptom control. Was seen 11-28 with bronchitis and d on Zpack. States cold symptoms have improved, but still coughing, sometimes productive green sputum during the day. Is using Spiriva consistently.   She also complains of persistent pain in her low back and hips, right worse than left. She has prescription for tramadol which she takes when the pain gets severe. She had x-rays spine in January 2016 showing advanced degenerative disc disease at L3-4. Lower lumbar facet and interspinous arthropathy.    Allergies  Allergen Reactions  . Alendronate     Other reaction(s): Vomiting     Current Outpatient Medications:  .  amLODipine (NORVASC) 5 MG tablet, TAKE ONE (1) TABLET EACH DAY, Disp: 90 tablet, Rfl: 4 .  aspirin 81 MG tablet, Take 81 mg by mouth daily., Disp: , Rfl:  .  cetirizine (ZYRTEC) 10 MG tablet, Take 10 mg by mouth daily., Disp: , Rfl:  .  Diphenhydramine-APAP, sleep, (TYLENOL PM EXTRA STRENGTH PO), Take 1 tablet by mouth at bedtime as needed., Disp: , Rfl:  .  dorzolamide (TRUSOPT) 2 % ophthalmic solution, 1 drop 2 (two) times daily., Disp: , Rfl:  .  glipiZIDE-metformin (METAGLIP) 2.5-500 MG tablet, TAKE ONE TABLET EVERY MORNING, Disp: 90 tablet, Rfl: 4 .  ibandronate (BONIVA) 150 MG tablet, TAKE ONE TABLET ONCE A MONTH FIRST THING IN THE MORNING AT LEAST 1 HOUR BEFORE EATING, TAKE WITH WAT, Disp:  1 tablet, Rfl: 12 .  latanoprost (XALATAN) 0.005 % ophthalmic solution, 1 drop at bedtime., Disp: , Rfl:  .  levothyroxine (SYNTHROID, LEVOTHROID) 100 MCG tablet, TAKE ONE (1) TABLET BY MOUTH EVERY DAY, Disp: 90 tablet, Rfl: 4 .  loperamide (IMODIUM) 2 MG capsule, Take 2 mg by  mouth as needed for diarrhea or loose stools., Disp: , Rfl:  .  lovastatin (MEVACOR) 40 MG tablet, TAKE ONE (1) TABLET AT BEDTIME, Disp: 90 tablet, Rfl: 2 .  naproxen sodium (ANAPROX) 220 MG tablet, Take 220 mg by mouth. As needed, Disp: , Rfl:  .  nortriptyline (PAMELOR) 25 MG capsule, TAKE ONE CAPSULE BY MOUTH AT BEDTIME, Disp: 30 capsule, Rfl: 12 .  omeprazole (PRILOSEC) 40 MG capsule, Take 1 capsule (40 mg total) by mouth daily., Disp: 30 capsule, Rfl: 11 .  ONE TOUCH ULTRA TEST test strip, TEST TWICE DAILY, Disp: 100 each, Rfl: 5 .  Polyethylene Glycol 3350 (DULCOLAX BALANCE PO), Take 2 tablets by mouth as needed., Disp: , Rfl:  .  Tiotropium Bromide Monohydrate (SPIRIVA RESPIMAT) 2.5 MCG/ACT AERS, Inhale 2 puffs into the lungs daily., Disp: 3 Inhaler, Rfl: 4 .  traMADol (ULTRAM) 50 MG tablet, TAKE ONE TABLET EVERY 8 HOURS AS NEEDED, Disp: 30 tablet, Rfl: 4 .  valsartan-hydrochlorothiazide (DIOVAN-HCT) 160-25 MG tablet, Take 1 tablet by mouth daily., Disp: 30 tablet, Rfl: 11  Review of Systems  Constitutional: Negative for appetite change, chills, fatigue and fever.  Respiratory: Positive for cough. Negative for chest tightness and shortness of breath.   Cardiovascular: Negative for chest pain and palpitations.  Gastrointestinal: Negative for abdominal pain, nausea and vomiting.  Musculoskeletal: Positive for arthralgias (right hip pain).  Neurological: Negative for dizziness and weakness.    Social History   Tobacco Use  . Smoking status: Former Smoker    Last attempt to quit: 07/18/1991    Years since quitting: 25.9  . Smokeless tobacco: Never Used  Substance Use Topics  . Alcohol use: No   Objective:   BP 140/60 (BP Location: Left Arm, Patient Position: Sitting, Cuff Size: Normal)   Pulse 77   Temp 97.8 F (36.6 C) (Oral)   Resp 16   Wt 131 lb (59.4 kg)   SpO2 98% Comment: room air  BMI 23.96 kg/m  There were no vitals filed for this visit.   Physical  Exam   General Appearance:    Alert, cooperative, no distress  Eyes:    PERRL, conjunctiva/corneas clear, EOM's intact       Lungs:     Clear to auscultation bilaterally, respirations unlabored  Heart:    Regular rate and rhythm  Neurologic:   Awake, alert, oriented x 3. No apparent focal neurological           defect.       Results for orders placed or performed in visit on 07/03/17  POCT HgB A1C  Result Value Ref Range   Hemoglobin A1C 6.0    Est. average glucose Bld gHb Est-mCnc 126        Assessment & Plan:     1. Controlled type 2 diabetes mellitus with complication, without long-term current use of insulin ([place]) Well controlled.  Continue current medications.   - POCT HgB A1C  2. Essential (primary) hypertension Stable, Continue current medications.    3. Chronic obstructive pulmonary disease, unspecified COPD type ([place]) Having chronic productive cough after finishing azithromycin. Will try montelukast 10mg  daily.   4. Other diabetic neurological complication associated  with type 2 diabetes mellitus ([place])   5. Chronic kidney disease (CKD), stage III (moderate) (HCC)   6. Uncomplicated asthma, unspecified asthma severity, unspecified whether persistent  - montelukast (SINGULAIR) 10 MG tablet; Take 1 tablet (10 mg total) by mouth daily. For chronic cough  Dispense: 30 tablet; Refill: 2  7. Osteoporosis, unspecified osteoporosis type, unspecified pathological fracture presence Doing well on Boniva  - DG Bone Density; Future  8. Osteoarthritis, unspecified osteoarthritis type, unspecified site She Is increasingly bothered by this and wondering if she needs an MRI. Advised her pain is likely secondary to OA of spine and aggravated by history of hip fracture. Will refill  traMADol (ULTRAM) 50 MG tablet; TAKE ONE TABLET EVERY 8 HOURS AS NEEDED  Dispense: 30 tablet; Refill: 2  - Ambulatory referral to Orthopedic Surgery for further evaluation and treatment  9. Chronic  right hip pain   10. Osteoarthritis of hip, unspecified laterality, unspecified osteoarthritis type  11. Personal history of traumatic fracture   Return in about 6 months (around 01/01/2018).        Lelon Huh, MD  [facility]

## 2017-07-06 DIAGNOSIS — M5416 Radiculopathy, lumbar region: Secondary | ICD-10-CM | POA: Diagnosis not present

## 2017-07-30 DIAGNOSIS — M5416 Radiculopathy, lumbar region: Secondary | ICD-10-CM | POA: Diagnosis not present

## 2017-08-01 DIAGNOSIS — M5416 Radiculopathy, lumbar region: Secondary | ICD-10-CM | POA: Diagnosis not present

## 2017-08-01 DIAGNOSIS — M48061 Spinal stenosis, lumbar region without neurogenic claudication: Secondary | ICD-10-CM | POA: Diagnosis not present

## 2017-08-09 ENCOUNTER — Ambulatory Visit
Admission: RE | Admit: 2017-08-09 | Discharge: 2017-08-09 | Disposition: A | Discharge status: Home / Self Care | Payer: PPO | Source: Ambulatory Visit | Attending: Family Medicine | Admitting: Family Medicine

## 2017-08-09 DIAGNOSIS — M810 Age-related osteoporosis without current pathological fracture: Secondary | ICD-10-CM | POA: Diagnosis not present

## 2017-08-09 DIAGNOSIS — M85831 Other specified disorders of bone density and structure, right forearm: Secondary | ICD-10-CM | POA: Insufficient documentation

## 2017-08-15 DIAGNOSIS — M48061 Spinal stenosis, lumbar region without neurogenic claudication: Secondary | ICD-10-CM | POA: Diagnosis not present

## 2017-08-15 DIAGNOSIS — M545 Low back pain: Secondary | ICD-10-CM | POA: Diagnosis not present

## 2017-08-15 DIAGNOSIS — M5416 Radiculopathy, lumbar region: Secondary | ICD-10-CM | POA: Diagnosis not present

## 2017-09-12 DIAGNOSIS — M5416 Radiculopathy, lumbar region: Secondary | ICD-10-CM | POA: Diagnosis not present

## 2017-10-15 ENCOUNTER — Ambulatory Visit: Payer: PPO | Admitting: Podiatry

## 2017-10-23 ENCOUNTER — Other Ambulatory Visit: Payer: Self-pay | Admitting: Podiatry

## 2017-10-23 ENCOUNTER — Encounter: Payer: Self-pay | Admitting: Podiatry

## 2017-10-23 ENCOUNTER — Ambulatory Visit: Payer: PPO | Admitting: Podiatry

## 2017-10-23 ENCOUNTER — Ambulatory Visit (INDEPENDENT_AMBULATORY_CARE_PROVIDER_SITE_OTHER): Payer: PPO

## 2017-10-23 DIAGNOSIS — L600 Ingrowing nail: Secondary | ICD-10-CM | POA: Diagnosis not present

## 2017-10-23 DIAGNOSIS — L02612 Cutaneous abscess of left foot: Secondary | ICD-10-CM | POA: Diagnosis not present

## 2017-10-23 DIAGNOSIS — R52 Pain, unspecified: Secondary | ICD-10-CM

## 2017-10-23 DIAGNOSIS — S99922A Unspecified injury of left foot, initial encounter: Secondary | ICD-10-CM

## 2017-10-23 MED ORDER — GENTAMICIN SULFATE 0.1 % EX CREA: 1.0000 "application " | TOPICAL_CREAM | Freq: Three times a day (TID) | CUTANEOUS | 1 refills | Status: DC

## 2017-10-23 NOTE — Patient Instructions (Signed)

## 2017-10-24 ENCOUNTER — Other Ambulatory Visit: Payer: Self-pay | Admitting: Family Medicine

## 2017-10-24 NOTE — Progress Notes (Signed)
Subjective: 84 year old female with PMHx of T2DM presenting today as a new patient with multiple complaints. She reports pain to the plantar aspect of the left heel that has been present for the past 4 weeks. She believes she may have stepped on something but is unsure of what. She has not had any treatment for this. Bearing weight increases this pain.  She also reports pain to the medial and lateral borders of bilateral great toes that have been ongoing for several years. She states her husband used to trim the ingrowns out before he passed away. She has not had any treatment for them other than that. Applying pressure to the toes increases the pain.  She also complains of elongated, thickened nails of bilateral feet that cause pain while ambulating in shoes. She is unable to trim her own nails. Patient is here for further evaluation and treatment.   Past Medical History:  Diagnosis Date  . Cancer ([place])   . Chronic kidney disease   . COPD (chronic obstructive pulmonary disease) ([place])   . Varicose veins of lower extremities with other complications     Objective:  General: Well developed, nourished, in no acute distress, alert and oriented x3   Dermatology: Nails are tender, long, thickened and dystrophic with subungual debris, consistent with onychomycosis, 1-5 bilateral. No signs of infection noted. Skin is warm, dry and supple bilateral. Medial and lateral borders of the bilateral great toes appear to be erythematous with evidence of ingrowing nails. Pain on palpation noted to the border of the nail fold. Focal abscess noted with periwound callus to the left heel.   Vascular: Dorsalis Pedis artery and Posterior Tibial artery pedal pulses palpable. No lower extremity edema noted.   Neruologic: Grossly intact via light touch bilateral.  Musculoskeletal: Muscular strength within normal limits in all groups bilateral. Normal range of motion noted to all pedal and ankle joints.    Radiographic Exam:  Normal osseous mineralization. Joint spaces preserved. No fracture/dislocation/boney destruction.     Assesement: #1 Paronychia with ingrowing nail medial and lateral borders of bilateral great toes #2 Incurvated nails #3 Foreign body abscess left plantar heel #4 Onychomycosis of nail due to dermatophyte bilateral #5 Diabetes Mellitus w/ peripheral neuropathy  Plan of Care:  1. Patient evaluated. X-Rays reviewed.  2. Discussed treatment alternatives and plan of care. Explained nail avulsion procedure and post procedure course to patient. 3. Patient opted for permanent partial nail avulsion to bilateral borders of bilateral great toes.  4. Prior to procedure, local anesthesia infiltration utilized using 3 ml of a 50:50 mixture of 2% plain lidocaine and 0.5% plain marcaine in a normal hallux block fashion and a betadine prep performed.  5. Partial permanent nail avulsion with chemical matrixectomy performed using [id number] applications of phenol followed by alcohol flush.  6. Light dressing applied. 7. Incision and drainage performed to abscess of the plantar left heel and a 3 mm piece of glass was removed with a chisel blade. Wound was cleansed and a dry sterile dressing applied.  8. Prescription for gentamicin cream provided to patient to be applied to ingrowns and left plantar heel daily.  9. Return to clinic in 2 weeks.   Edrick Kins, DPM [facility]  Dr. Edrick Kins, DPM    [address]  [place], [place] [address]                Office [phone]  Fax [phone]

## 2017-11-05 ENCOUNTER — Other Ambulatory Visit: Payer: Self-pay | Admitting: Family Medicine

## 2017-11-06 ENCOUNTER — Encounter: Payer: Self-pay | Admitting: Podiatry

## 2017-11-06 ENCOUNTER — Ambulatory Visit (INDEPENDENT_AMBULATORY_CARE_PROVIDER_SITE_OTHER): Payer: PPO | Admitting: Podiatry

## 2017-11-06 DIAGNOSIS — L600 Ingrowing nail: Secondary | ICD-10-CM

## 2017-11-08 NOTE — Progress Notes (Signed)
   Subjective: Patient presents today 2 weeks post ingrown nail permanent nail avulsion procedure to the medial and lateral borders of bilateral great toes. Patient states that the toe and nail fold is feeling much better. She denies any drainage and reports minimal soreness. Patient is here for further evaluation and treatment.   Past Medical History:  Diagnosis Date  . Cancer ([place])   . Chronic kidney disease   . COPD (chronic obstructive pulmonary disease) ([place])   . Varicose veins of lower extremities with other complications     Objective: Skin is warm, dry and supple. Nail and respective nail fold appears to be healing appropriately. Open wound to the associated nail fold with a granular wound base and moderate amount of fibrotic tissue. Minimal drainage noted. Mild erythema around the periungual region likely due to phenol chemical matricectomy.  Assessment: #1 postop permanent partial nail avulsion bilateral borders of bilateral great toes #2 open wound periungual nail fold of respective digit.  #3 foreign body abscess left plantar heel - resolved  Plan of care: #1 patient was evaluated  #2 debridement of open wound was performed to the periungual border of the respective toe using a currette. Antibiotic ointment and Band-Aid was applied. #3 patient is to return to clinic on a PRN basis.   Edrick Kins, DPM [facility]  Dr. Edrick Kins, [place]                                        [place], [place] [address]                Office [phone]  Fax [phone]

## 2017-11-28 DIAGNOSIS — M5416 Radiculopathy, lumbar region: Secondary | ICD-10-CM | POA: Diagnosis not present

## 2017-11-29 ENCOUNTER — Telehealth: Payer: Self-pay

## 2017-11-29 NOTE — Telephone Encounter (Signed)
LMTCB and scheduled AWV. Pt has a f/u scheduled for 01/02/18. Was going to see if pt would like to change the f/u and schedule an AWV and CPE. -MM

## 2017-12-11 DIAGNOSIS — H401131 Primary open-angle glaucoma, bilateral, mild stage: Secondary | ICD-10-CM | POA: Diagnosis not present

## 2017-12-12 DIAGNOSIS — M461 Sacroiliitis, not elsewhere classified: Secondary | ICD-10-CM | POA: Diagnosis not present

## 2017-12-18 ENCOUNTER — Other Ambulatory Visit: Payer: Self-pay | Admitting: Family Medicine

## 2017-12-21 ENCOUNTER — Ambulatory Visit (INDEPENDENT_AMBULATORY_CARE_PROVIDER_SITE_OTHER): Payer: PPO

## 2017-12-21 VITALS — BP 146/50 | HR 86 | Temp 98.3°F | Ht 62.0 in | Wt 124.8 lb

## 2017-12-21 DIAGNOSIS — Z0000 Encounter for general adult medical examination without abnormal findings: Secondary | ICD-10-CM

## 2017-12-21 NOTE — Progress Notes (Addendum)
Subjective:   Robyn Butler is a 84 y.o. female who presents for Medicare Annual (Subsequent) preventive examination.  Review of Systems:  N/A  Cardiac Risk Factors include: advanced age (>36men, >51 women);diabetes mellitus;dyslipidemia;hypertension;Other (see comment), Risk factor comments: COPD     Objective:     Vitals: BP (!) 146/50 (BP Location: Right Arm)   Pulse 86   Temp 98.3 F (36.8 C) (Oral)   Ht 5\' 2"  (1.575 m)   Wt 124 lb 12.8 oz (56.6 kg)   BMI 22.83 kg/m   Body mass index is 22.83 kg/m.  Advanced Directives 12/21/2017 12/19/2016  Does Patient Have a Medical Advance Directive? No No  Would patient like information on creating a medical advance directive? No - Patient declined No - Patient declined    Tobacco Social History   Tobacco Use  Smoking Status Former Smoker  . Last attempt to quit: 07/18/1991  . Years since quitting: 26.4  Smokeless Tobacco Never Used     Counseling given: Not Answered   Clinical Intake:  Pre-visit preparation completed: Yes  Pain : No/denies pain Pain Score: 0-No pain     Nutritional Status: BMI of 19-24  Normal Nutritional Risks: Nausea/ vomitting/ diarrhea(Diarrhea intermittenly due to IBS.) Diabetes: Yes(type 2) CBG done?: No Did pt. bring in CBG monitor from home?: No  How often do you need to have someone help you when you read instructions, pamphlets, or other written materials from your doctor or pharmacy?: 1 - Never  Interpreter Needed?: No  Information entered by :: [facility], LPN  Past Medical History:  Diagnosis Date  . Arthritis   . Cancer ([place])   . Chronic kidney disease   . COPD (chronic obstructive pulmonary disease) ([place])   . Diabetes mellitus without complication ([place])    type 2  . Hypertension   . Varicose veins of lower extremities with other complications    Past Surgical History:  Procedure Laterality Date  . ABDOMINAL HYSTERECTOMY  1972  . BLADDER SUSPENSION    . BREAST EXCISIONAL  BIOPSY Right 1999   neg  . CHOLECYSTECTOMY  1975  . COLONOSCOPY    . COLONOSCOPY WITH PROPOFOL N/A 05/24/2015   Procedure: COLONOSCOPY WITH PROPOFOL;  Surgeon: Hulen Luster, MD;  Location: [facility] ENDOSCOPY;  Service: Gastroenterology;  Laterality: N/A;  . [place]  . ESOPHAGOGASTRODUODENOSCOPY N/A 05/24/2015   Procedure: ESOPHAGOGASTRODUODENOSCOPY (EGD);  Surgeon: Hulen Luster, MD;  Location: [facility] ENDOSCOPY;  Service: Gastroenterology;  Laterality: N/A;  . FRACTURE SURGERY    . JOINT REPLACEMENT Right 2013   hip  . salpingo oophorectmy     . THYROIDECTOMY  1969  . WRIST FRACTURE SURGERY Left    Family History  Problem Relation Age of Onset  . Heart attack Father   . Asthma Father   . Kidney disease Mother   . Hypertension Mother   . Hypertension Other   . Diabetes Other   . Heart attack Other   . Breast cancer Neg Hx    Social History   Socioeconomic History  . Marital status: Widowed    Spouse name: Not on file  . Number of children: 2  . Years of education: 9th Grade  . Highest education level: 9th grade  Occupational History  . Occupation: Retired  Scientific laboratory technician  . Financial resource strain: Not hard at all  . Food insecurity:    Worry: Never true    Inability: Never true  . Transportation needs:  Medical: No    Non-medical: No  Tobacco Use  . Smoking status: Former Smoker    Last attempt to quit: 07/18/1991    Years since quitting: 26.4  . Smokeless tobacco: Never Used  Substance and Sexual Activity  . Alcohol use: No  . Drug use: No  . Sexual activity: Not on file  Lifestyle  . Physical activity:    Days per week: Not on file    Minutes per session: Not on file  . Stress: Not at all  Relationships  . Social connections:    Talks on phone: Not on file    Gets together: Not on file    Attends religious service: Not on file    Active member of club or organization: Not on file    Attends meetings of clubs or organizations: Not on file    Relationship  status: Not on file  Other Topics Concern  . Not on file  Social History Narrative  . Not on file    Outpatient Encounter Medications as of 12/21/2017  Medication Sig  . amLODipine (NORVASC) 5 MG tablet TAKE ONE (1) TABLET EACH DAY  . aspirin EC 81 MG tablet Take 81 mg by mouth daily.   . Diphenhydramine-APAP, sleep, (TYLENOL PM EXTRA STRENGTH PO) Take 1 tablet by mouth at bedtime as needed.  . dorzolamide (TRUSOPT) 2 % ophthalmic solution 1 drop 2 (two) times daily.  Marland Kitchen glipiZIDE-metformin (METAGLIP) 2.5-500 MG tablet TAKE ONE TABLET EVERY MORNING  . ibandronate (BONIVA) 150 MG tablet TAKE ONE TABLET ONCE A MONTH FIRST THING IN THE MORNING AT LEAST 1 HOUR BEFORE EATING, TAKE WITH WAT  . latanoprost (XALATAN) 0.005 % ophthalmic solution 1 drop at bedtime.  Marland Kitchen levothyroxine (SYNTHROID, LEVOTHROID) 100 MCG tablet TAKE ONE (1) TABLET BY MOUTH EVERY DAY  . loperamide (IMODIUM) 2 MG capsule Take 2 mg by mouth as needed for diarrhea or loose stools.  . lovastatin (MEVACOR) 40 MG tablet TAKE ONE (1) TABLET AT BEDTIME  . naproxen sodium (ANAPROX) 220 MG tablet Take 220 mg by mouth. As needed  . nortriptyline (PAMELOR) 25 MG capsule TAKE ONE CAPSULE BY MOUTH AT BEDTIME  . omeprazole (PRILOSEC) 40 MG capsule Take 1 capsule (40 mg total) by mouth daily.  . ONE TOUCH ULTRA TEST test strip TEST SUGAR TWICE DAILY  . Polyethylene Glycol 3350 (DULCOLAX BALANCE PO) Take 2 tablets by mouth as needed.  Marland Kitchen SPIRIVA RESPIMAT 2.5 MCG/ACT AERS USE 2 PUFFS EVERY DAY  . traMADol (ULTRAM) 50 MG tablet TAKE ONE TABLET EVERY 8 HOURS AS NEEDED  . valsartan-hydrochlorothiazide (DIOVAN-HCT) 160-25 MG tablet Take 1 tablet by mouth daily.  . cetirizine (ZYRTEC) 10 MG tablet Take 10 mg by mouth daily.  Marland Kitchen gentamicin cream (GARAMYCIN) 0.1 % Apply 1 application topically 3 (three) times daily. (Patient not taking: Reported on 12/21/2017)  . montelukast (SINGULAIR) 10 MG tablet Take 1 tablet (10 mg total) by mouth daily. For chronic  cough   No facility-administered encounter medications on file as of 12/21/2017.     Activities of Daily Living In your present state of health, do you have any difficulty performing the following activities: 12/21/2017  Hearing? Y  Comment Wears bilateral hearing aids.  Vision? Y  Comment Due to glaucoma. Has readers and a magnifying glasses to help.   Difficulty concentrating or making decisions? Y  Walking or climbing stairs? Y  Comment Due to COPD.  Dressing or bathing? N  Doing errands, shopping? N  Preparing Food  and eating ? N  Using the Toilet? N  In the past six months, have you accidently leaked urine? Y  Comment Occasionally in the AM trying to make it to the bathroom.   Do you have problems with loss of bowel control? Y  Comment Occasionally due to diarrhea from IBS.  Managing your Medications? N  Managing your Finances? N  Housekeeping or managing your Housekeeping? N  Some recent data might be hidden    Patient Care Team: Birdie Sons, MD as PCP - General (Family Medicine) Dingeldein, Remo Lipps, MD as Consulting Physician (Ophthalmology) [place], MD as Referring Physician (Orthopedic Surgery) Nadene Rubins, DO as Referring Physician (Physical Medicine and Rehabilitation)    Assessment:   This is a routine wellness examination for [place].  Exercise Activities and Dietary recommendations Current Exercise Habits: The patient does not participate in regular exercise at present, Exercise limited by: orthopedic condition(s);respiratory conditions(s)  Goals    . DIET - INCREASE WATER INTAKE     Recommend increasing water intake to 4-6 glasses a day.        Fall Risk Fall Risk  12/21/2017 12/19/2016 06/27/2016 04/28/2015 04/28/2015  Falls in the past year? No No No No No   Is the patient's home free of loose throw rugs in walkways, pet beds, electrical cords, etc?   yes      Grab bars in the bathroom? yes      Handrails on the stairs?   no      Adequate  lighting?   yes  Timed Get Up and Go performed: N/A  Depression Screen PHQ 2/9 Scores 12/21/2017 12/19/2016 06/27/2016 04/28/2015  PHQ - 2 Score 0 2 2 0  PHQ- 9 Score - 8 6 -     Cognitive Function     6CIT Screen 12/21/2017 12/19/2016  What Year? 0 points 0 points  What month? 0 points 0 points  What time? 0 points 0 points  Count back from 20 0 points 0 points  Months in reverse 2 points 0 points  Repeat phrase 2 points 6 points  Total Score 4 6    Immunization History  Administered Date(s) Administered  . Influenza, High Dose Seasonal PF 04/28/2015, 04/20/2016, 05/10/2017  . Pneumococcal Conjugate-13 11/26/2013  . Pneumococcal Polysaccharide-23 05/16/2003  . Td 04/02/1995  . Zoster 07/20/2011    Qualifies for Shingles Vaccine? Due for Shingles vaccine. Declined my offer to administer today. Education has been provided regarding the importance of this vaccine. Pt has been advised to call her insurance company to determine her out of pocket expense. Advised she may also receive this vaccine at her local pharmacy or Health Dept. Verbalized acceptance and understanding.  Screening Tests Health Maintenance  Topic Date Due  . TETANUS/TDAP  07/17/2026 (Originally 04/01/2005)  . HEMOGLOBIN A1C  01/01/2018  . FOOT EXAM  01/02/2018  . INFLUENZA VACCINE  02/14/2018  . OPHTHALMOLOGY EXAM  06/14/2018  . DEXA SCAN  08/09/2020  . PNA vac Low Risk Adult  Completed    Cancer Screenings: Lung: Low Dose CT Chest recommended if Age 12-80 years, 30 pack-year currently smoking OR have quit w/in 15years. Patient does not qualify. Breast:  Up to date on Mammogram? Yes   Up to date of Bone Density/Dexa? Yes Colorectal: Up to date  Additional Screenings:  Hepatitis C Screening: N/A     Plan:  I have personally reviewed and addressed the Medicare Annual Wellness questionnaire and have noted the following in the patient's  chart:  A. Medical and social history B. Use of alcohol, tobacco or  illicit drugs  C. Current medications and supplements D. Functional ability and status E.  Nutritional status F.  Physical activity G. Advance directives H. List of other physicians I.  Hospitalizations, surgeries, and ER visits in previous 12 months J.  [place] such as hearing and vision if needed, cognitive and depression L. Referrals and appointments - none  In addition, I have reviewed and discussed with patient certain preventive protocols, quality metrics, and best practice recommendations. A written personalized care plan for preventive services as well as general preventive health recommendations were provided to patient.  See attached scanned questionnaire for additional information.   Signed,  Fabio Neighbors, LPN Nurse Health Advisor   Nurse Recommendations: Pt declined the tetanus vaccine today.

## 2017-12-21 NOTE — Patient Instructions (Signed)
Robyn Butler , Thank you for taking time to come for your Medicare Wellness Visit. I appreciate your ongoing commitment to your health goals. Please review the following plan we discussed and let me know if I can assist you in the future.   Screening recommendations/referrals: Colonoscopy: Up to date Mammogram: Up to date Bone Density: Up to date Recommended yearly ophthalmology/optometry visit for glaucoma screening and checkup Recommended yearly dental visit for hygiene and checkup  Vaccinations: Influenza vaccine: Up to date Pneumococcal vaccine: Up to date Tdap vaccine: Pt declines today.  Shingles vaccine: Pt declines today.     Advanced directives: Advance directive discussed with you today. Even though you declined this today please call our office should you change your mind and we can give you the proper paperwork for you to fill out.  Conditions/risks identified: Recommend increasing water intake to 4-6 glasses a day.   Next appointment: 01/02/18 with Dr Caryn Section.    Preventive Care 79 Years and Older, Female Preventive care refers to lifestyle choices and visits with your health care provider that can promote health and wellness. What does preventive care include?  A yearly physical exam. This is also called an annual well check.  Dental exams once or twice a year.  Routine eye exams. Ask your health care provider how often you should have your eyes checked.  Personal lifestyle choices, including:  Daily care of your teeth and gums.  Regular physical activity.  Eating a healthy diet.  Avoiding tobacco and drug use.  Limiting alcohol use.  Practicing safe sex.  Taking low-dose aspirin every day.  Taking vitamin and mineral supplements as recommended by your health care provider. What happens during an annual well check? The services and screenings done by your health care provider during your annual well check will depend on your age, overall health, lifestyle  risk factors, and family history of disease. Counseling  Your health care provider may ask you questions about your:  Alcohol use.  Tobacco use.  Drug use.  Emotional well-being.  Home and relationship well-being.  Sexual activity.  Eating habits.  History of falls.  Memory and ability to understand (cognition).  Work and work Statistician.  Reproductive health. Screening  You may have the following tests or measurements:  Height, weight, and BMI.  Blood pressure.  Lipid and cholesterol levels. These may be checked every 5 years, or more frequently if you are over 91 years old.  Skin check.  Lung cancer screening. You may have this screening every year starting at age 11 if you have a 30-pack-year history of smoking and currently smoke or have quit within the past 15 years.  Fecal occult blood test (FOBT) of the stool. You may have this test every year starting at age 29.  Flexible sigmoidoscopy or colonoscopy. You may have a sigmoidoscopy every 5 years or a colonoscopy every 10 years starting at age 51.  Hepatitis C blood test.  Hepatitis B blood test.  Sexually transmitted disease (STD) testing.  Diabetes screening. This is done by checking your blood sugar (glucose) after you have not eaten for a while (fasting). You may have this done every 1-3 years.  Bone density scan. This is done to screen for osteoporosis. You may have this done starting at age 90.  Mammogram. This may be done every 1-2 years. Talk to your health care provider about how often you should have regular mammograms. Talk with your health care provider about your test results, treatment options, and  if necessary, the need for more tests. Vaccines  Your health care provider may recommend certain vaccines, such as:  Influenza vaccine. This is recommended every year.  Tetanus, diphtheria, and acellular pertussis (Tdap, Td) vaccine. You may need a Td booster every 10 years.  Zoster vaccine.  You may need this after age 1.  Pneumococcal 13-valent conjugate (PCV13) vaccine. One dose is recommended after age 8.  Pneumococcal polysaccharide (PPSV23) vaccine. One dose is recommended after age 58. Talk to your health care provider about which screenings and vaccines you need and how often you need them. This information is not intended to replace advice given to you by your health care provider. Make sure you discuss any questions you have with your health care provider. Document Released: 07/30/2015 Document Revised: 03/22/2016 Document Reviewed: 05/04/2015 Elsevier Interactive Patient Education  2017 [employer] Prevention in the Home Falls can cause injuries. They can happen to people of all ages. There are many things you can do to make your home safe and to help prevent falls. What can I do on the outside of my home?  Regularly fix the edges of walkways and driveways and fix any cracks.  Remove anything that might make you trip as you walk through a door, such as a raised step or threshold.  Trim any bushes or trees on the path to your home.  Use bright outdoor lighting.  Clear any walking paths of anything that might make someone trip, such as rocks or tools.  Regularly check to see if handrails are loose or broken. Make sure that both sides of any steps have handrails.  Any raised decks and porches should have guardrails on the edges.  Have any leaves, snow, or ice cleared regularly.  Use sand or salt on walking paths during winter.  Clean up any spills in your garage right away. This includes oil or grease spills. What can I do in the bathroom?  Use night lights.  Install grab bars by the toilet and in the tub and shower. Do not use towel bars as grab bars.  Use non-skid mats or decals in the tub or shower.  If you need to sit down in the shower, use a plastic, non-slip stool.  Keep the floor dry. Clean up any water that spills on the floor as soon  as it happens.  Remove soap buildup in the tub or shower regularly.  Attach bath mats securely with double-sided non-slip rug tape.  Do not have throw rugs and other things on the floor that can make you trip. What can I do in the bedroom?  Use night lights.  Make sure that you have a light by your bed that is easy to reach.  Do not use any sheets or blankets that are too big for your bed. They should not hang down onto the floor.  Have a firm chair that has side arms. You can use this for support while you get dressed.  Do not have throw rugs and other things on the floor that can make you trip. What can I do in the kitchen?  Clean up any spills right away.  Avoid walking on wet floors.  Keep items that you use a lot in easy-to-reach places.  If you need to reach something above you, use a strong step stool that has a grab bar.  Keep electrical cords out of the way.  Do not use floor polish or wax that makes floors slippery. If you  must use wax, use non-skid floor wax.  Do not have throw rugs and other things on the floor that can make you trip. What can I do with my stairs?  Do not leave any items on the stairs.  Make sure that there are handrails on both sides of the stairs and use them. Fix handrails that are broken or loose. Make sure that handrails are as long as the stairways.  Check any carpeting to make sure that it is firmly attached to the stairs. Fix any carpet that is loose or worn.  Avoid having throw rugs at the top or bottom of the stairs. If you do have throw rugs, attach them to the floor with carpet tape.  Make sure that you have a light switch at the top of the stairs and the bottom of the stairs. If you do not have them, ask someone to add them for you. What else can I do to help prevent falls?  Wear shoes that:  Do not have high heels.  Have rubber bottoms.  Are comfortable and fit you well.  Are closed at the toe. Do not wear sandals.  If  you use a stepladder:  Make sure that it is fully opened. Do not climb a closed stepladder.  Make sure that both sides of the stepladder are locked into place.  Ask someone to hold it for you, if possible.  Clearly mark and make sure that you can see:  Any grab bars or handrails.  First and last steps.  Where the edge of each step is.  Use tools that help you move around (mobility aids) if they are needed. These include:  Canes.  Walkers.  Scooters.  Crutches.  Turn on the lights when you go into a dark area. Replace any light bulbs as soon as they burn out.  Set up your furniture so you have a clear path. Avoid moving your furniture around.  If any of your floors are uneven, fix them.  If there are any pets around you, be aware of where they are.  Review your medicines with your doctor. Some medicines can make you feel dizzy. This can increase your chance of falling. Ask your doctor what other things that you can do to help prevent falls. This information is not intended to replace advice given to you by your health care provider. Make sure you discuss any questions you have with your health care provider. Document Released: 04/29/2009 Document Revised: 12/09/2015 Document Reviewed: 08/07/2014 Elsevier Interactive Patient Education  2017 [employer].

## 2017-12-26 DIAGNOSIS — M461 Sacroiliitis, not elsewhere classified: Secondary | ICD-10-CM | POA: Diagnosis not present

## 2018-01-02 ENCOUNTER — Ambulatory Visit (INDEPENDENT_AMBULATORY_CARE_PROVIDER_SITE_OTHER): Payer: PPO | Admitting: Family Medicine

## 2018-01-02 ENCOUNTER — Encounter: Payer: Self-pay | Admitting: Family Medicine

## 2018-01-02 VITALS — BP 140/60 | HR 73 | Temp 97.7°F | Resp 16 | Ht 62.0 in | Wt 124.0 lb

## 2018-01-02 DIAGNOSIS — E1149 Type 2 diabetes mellitus with other diabetic neurological complication: Secondary | ICD-10-CM | POA: Diagnosis not present

## 2018-01-02 DIAGNOSIS — J449 Chronic obstructive pulmonary disease, unspecified: Secondary | ICD-10-CM

## 2018-01-02 DIAGNOSIS — E118 Type 2 diabetes mellitus with unspecified complications: Secondary | ICD-10-CM

## 2018-01-02 DIAGNOSIS — I10 Essential (primary) hypertension: Secondary | ICD-10-CM | POA: Diagnosis not present

## 2018-01-02 DIAGNOSIS — E039 Hypothyroidism, unspecified: Secondary | ICD-10-CM | POA: Diagnosis not present

## 2018-01-02 LAB — POCT GLYCOSYLATED HEMOGLOBIN (HGB A1C)
Est. average glucose Bld gHb Est-mCnc: 117
HEMOGLOBIN A1C: 5.7 % — AB (ref 4.0–5.6)

## 2018-01-02 MED ORDER — ONETOUCH ULTRASOFT LANCETS MISC: 3 refills | Status: DC

## 2018-01-02 MED ORDER — ONETOUCH ULTRA 2 W/DEVICE KIT: PACK | 0 refills | Status: DC

## 2018-01-02 NOTE — Progress Notes (Signed)
Patient: Robyn Butler Female    DOB: [birth year]   84 y.o.   MRN: [id number] Visit Date: 01/02/2018  Today's Provider: Lelon Huh, MD   Chief Complaint  Patient presents with  . Follow-up  . Diabetes  . Hypertension  . COPD   Subjective:   Patient saw McKenzie for AWV on 12/21/2017.  HPI   Diabetes Mellitus Type II, Follow-up:   Lab Results  Component Value Date   HGBA1C 5.7 (A) 01/02/2018   HGBA1C 6.0 07/03/2017   HGBA1C 5.6 01/02/2017   Last seen for diabetes 6 months ago.  Management since then includes; no changes. She reports good compliance with treatment. She is not having side effects. none Current symptoms include none and have been unchanged. Home blood sugar records: fasting range: is checking-normal fasting  Episodes of hypoglycemia? no   Current Insulin Regimen: n/a Most Recent Eye Exam: last week Weight trend: stable Prior visit with dietician: no Current diet: in general, an "unhealthy" diet Current exercise: none  -----------------------------------------------------------------   Hypertension, follow-up:  BP Readings from Last 3 Encounters:  01/02/18 140/60  12/21/17 (!) 146/50  07/03/17 140/60    She was last seen for hypertension 6 months ago.  BP at that visit was 140/60. Management since that visit includes; no changes.She reports good compliance with treatment. She is not having side effects. none She is not exercising. She is not adherent to low salt diet.   Outside blood pressures are is checking. She is experiencing none.  Patient denies none.   Cardiovascular risk factors include diabetes mellitus.  Use of agents associated with hypertension: none.   -----------------------------------------------------------------   Chronic obstructive pulmonary disease, unspecified COPD type ([place]) From 07/03/2017-started montelukast 48m daily.   Follow up hypothyroid Lab Results  Component Value Date   TSH 1.100  06/27/2016      Allergies  Allergen Reactions  . Alendronate Nausea And Vomiting    Other reaction(s): Vomiting     Current Outpatient Medications:  .  amLODipine (NORVASC) 5 MG tablet, TAKE ONE (1) TABLET EACH DAY, Disp: 90 tablet, Rfl: 4 .  aspirin EC 81 MG tablet, Take 81 mg by mouth daily. , Disp: , Rfl:  .  diclofenac sodium (VOLTAREN) 1 % GEL, Voltaren 1 % topical gel  APPLY 2 GRAM TO THE AFFECTED AREA(S) BY TOPICAL ROUTE 4 TIMES PER DAY, Disp: , Rfl:  .  Diphenhydramine-APAP, sleep, (TYLENOL PM EXTRA STRENGTH PO), Take 1 tablet by mouth at bedtime as needed., Disp: , Rfl:  .  dorzolamide (TRUSOPT) 2 % ophthalmic solution, 1 drop 2 (two) times daily., Disp: , Rfl:  .  gentamicin cream (GARAMYCIN) 0.1 %, Apply 1 application topically 3 (three) times daily., Disp: 30 g, Rfl: 1 .  glipiZIDE-metformin (METAGLIP) 2.5-500 MG tablet, TAKE ONE TABLET EVERY MORNING, Disp: 90 tablet, Rfl: 4 .  ibandronate (BONIVA) 150 MG tablet, TAKE ONE TABLET ONCE A MONTH FIRST THING IN THE MORNING AT LEAST 1 HOUR BEFORE EATING, TAKE WITH WAT, Disp: 1 tablet, Rfl: 12 .  latanoprost (XALATAN) 0.005 % ophthalmic solution, 1 drop at bedtime., Disp: , Rfl:  .  levothyroxine (SYNTHROID, LEVOTHROID) 100 MCG tablet, TAKE ONE (1) TABLET BY MOUTH EVERY DAY, Disp: 90 tablet, Rfl: 4 .  loperamide (IMODIUM) 2 MG capsule, Take 2 mg by mouth as needed for diarrhea or loose stools., Disp: , Rfl:  .  lovastatin (MEVACOR) 40 MG tablet, TAKE ONE (1) TABLET AT BEDTIME, Disp: 90 tablet,  Rfl: 2 .  montelukast (SINGULAIR) 10 MG tablet, Take 1 tablet (10 mg total) by mouth daily. For chronic cough, Disp: 30 tablet, Rfl: 2 .  naproxen sodium (ANAPROX) 220 MG tablet, Take 220 mg by mouth. As needed, Disp: , Rfl:  .  nortriptyline (PAMELOR) 25 MG capsule, TAKE ONE CAPSULE BY MOUTH AT BEDTIME, Disp: 30 capsule, Rfl: 11 .  omeprazole (PRILOSEC) 40 MG capsule, Take 1 capsule (40 mg total) by mouth daily., Disp: 30 capsule, Rfl: 11 .   ONE TOUCH ULTRA TEST test strip, TEST SUGAR TWICE DAILY, Disp: 100 each, Rfl: 4 .  Polyethylene Glycol 3350 (DULCOLAX BALANCE PO), Take 2 tablets by mouth as needed., Disp: , Rfl:  .  SPIRIVA RESPIMAT 2.5 MCG/ACT AERS, USE 2 PUFFS EVERY DAY, Disp: 3 Inhaler, Rfl: 4 .  traMADol (ULTRAM) 50 MG tablet, TAKE ONE TABLET EVERY 8 HOURS AS NEEDED, Disp: 30 tablet, Rfl: 2 .  valsartan-hydrochlorothiazide (DIOVAN-HCT) 160-25 MG tablet, Take 1 tablet by mouth daily., Disp: 30 tablet, Rfl: 11 .  cetirizine (ZYRTEC) 10 MG tablet, Take 10 mg by mouth daily., Disp: , Rfl:   Review of Systems  Constitutional: Negative for appetite change, chills, fatigue and fever.  Respiratory: Negative for chest tightness and shortness of breath.   Cardiovascular: Negative for chest pain and palpitations.  Gastrointestinal: Negative for abdominal pain, nausea and vomiting.  Neurological: Negative for dizziness and weakness.    Social History   Tobacco Use  . Smoking status: Former Smoker    Last attempt to quit: 07/18/1991    Years since quitting: 26.4  . Smokeless tobacco: Never Used  Substance Use Topics  . Alcohol use: No   Objective:   BP 140/60 (BP Location: Right Arm, Patient Position: Sitting, Cuff Size: Normal)   Pulse 73   Temp 97.7 F (36.5 C) (Oral)   Resp 16   Ht '5\' 2"'  (1.575 m)   Wt 124 lb (56.2 kg)   SpO2 98%   BMI 22.68 kg/m  Vitals:   01/02/18 1124  BP: 140/60  Pulse: 73  Resp: 16  Temp: 97.7 F (36.5 C)  TempSrc: Oral  SpO2: 98%  Weight: 124 lb (56.2 kg)  Height: '5\' 2"'  (1.575 m)     Physical Exam   General Appearance:    Alert, cooperative, no distress  Eyes:    PERRL, conjunctiva/corneas clear, EOM's intact       Lungs:     Clear to auscultation bilaterally, respirations unlabored  Heart:    Regular rate and rhythm  Neurologic:   Awake, alert, oriented x 3. No apparent focal neurological           defect.       Results for orders placed or performed in visit on 01/02/18    POCT glycosylated hemoglobin (Hb A1C)  Result Value Ref Range   Hemoglobin A1C 5.7 (A) 4.0 - 5.6 %   HbA1c, POC (prediabetic range)  5.7 - 6.4 %   HbA1c, POC (controlled diabetic range)  0.0 - 7.0 %   Est. average glucose Bld gHb Est-mCnc 117        Assessment & Plan:     1. Controlled type 2 diabetes mellitus with complication, without long-term current use of insulin (HCC)  - POCT glycosylated hemoglobin (Hb A1C) - Blood Glucose Monitoring Suppl (ONE TOUCH ULTRA 2) w/Device KIT; Use to check blood sugar daily  Dispense: 1 each; Refill: 0 - Lancets (ONETOUCH ULTRASOFT) lancets; Use as instructed to check  blood sugar once a day  Dispense: 100 each; Refill: 3 - CBC - Lipid panel  2. Essential (primary) hypertension Well controlled.  Continue current medications.   - Comprehensive metabolic panel - CBC  3. Other diabetic neurological complication associated with type 2 diabetes mellitus ([place])   4. Chronic obstructive pulmonary disease, unspecified COPD type ([place]) Continue current medications.    5. Adult hypothyroidism Stable on current dose of levothyroxine.  - TSH       Lelon Huh, MD  [facility]

## 2018-01-03 ENCOUNTER — Other Ambulatory Visit: Payer: Self-pay | Admitting: Family Medicine

## 2018-01-03 ENCOUNTER — Telehealth: Payer: Self-pay

## 2018-01-03 LAB — LIPID PANEL
CHOLESTEROL TOTAL: 112 mg/dL (ref 100–199)
Chol/HDL Ratio: 2.4 ratio (ref 0.0–4.4)
HDL: 46 mg/dL (ref >39)
LDL CALC: 55 mg/dL (ref 0–99)
Triglycerides: 55 mg/dL (ref 0–149)
VLDL CHOLESTEROL CAL: 11 mg/dL (ref 5–40)

## 2018-01-03 LAB — CBC
HEMOGLOBIN: 10.2 g/dL — AB (ref 11.1–15.9)
Hematocrit: 30.7 % — ABNORMAL LOW (ref 34.0–46.6)
MCH: 30.4 pg (ref 26.6–33.0)
MCHC: 33.2 g/dL (ref 31.5–35.7)
MCV: 91 fL (ref 79–97)
Platelets: 202 10*3/uL (ref 150–450)
RBC: 3.36 x10E6/uL — AB (ref 3.77–5.28)
RDW: 13 % (ref 12.3–15.4)
WBC: 6.2 10*3/uL (ref 3.4–10.8)

## 2018-01-03 LAB — COMPREHENSIVE METABOLIC PANEL
ALT: 12 IU/L (ref 0–32)
AST: 11 IU/L (ref 0–40)
Albumin/Globulin Ratio: 1.7 (ref 1.2–2.2)
Albumin: 4.5 g/dL (ref 3.5–4.7)
Alkaline Phosphatase: 53 IU/L (ref 39–117)
BUN/Creatinine Ratio: 29 — ABNORMAL HIGH (ref 12–28)
BUN: 41 mg/dL — ABNORMAL HIGH (ref 8–27)
Bilirubin Total: 0.3 mg/dL (ref 0.0–1.2)
CALCIUM: 9 mg/dL (ref 8.7–10.3)
CO2: 20 mmol/L (ref 20–29)
CREATININE: 1.42 mg/dL — AB (ref 0.57–1.00)
Chloride: 108 mmol/L — ABNORMAL HIGH (ref 96–106)
GFR calc Af Amer: 39 mL/min/{1.73_m2} — ABNORMAL LOW (ref >59)
GFR calc non Af Amer: 33 mL/min/{1.73_m2} — ABNORMAL LOW (ref >59)
GLUCOSE: 85 mg/dL (ref 65–99)
Globulin, Total: 2.6 g/dL (ref 1.5–4.5)
POTASSIUM: 4.8 mmol/L (ref 3.5–5.2)
Sodium: 142 mmol/L (ref 134–144)
Total Protein: 7.1 g/dL (ref 6.0–8.5)

## 2018-01-03 LAB — TSH: TSH: 0.714 u[IU]/mL (ref 0.450–4.500)

## 2018-01-03 NOTE — Telephone Encounter (Signed)
-----   Message from Birdie Sons, MD sent at 01/03/2018 11:41 AM EDT ----- Kidney functions have declined a bit. Need to drink more water. Otherwise labs normal. Continue current medications.  Follow up in December as scheduled.

## 2018-01-03 NOTE — Telephone Encounter (Signed)
LMTCB 01/03/2018  Thanks,   -Mickel Baas

## 2018-01-08 NOTE — Telephone Encounter (Signed)
Patient was notified of results. Expressed understanding.  

## 2018-01-15 NOTE — Telephone Encounter (Signed)
Completed on 12/21/17. -MM

## 2018-01-16 ENCOUNTER — Other Ambulatory Visit: Payer: Self-pay | Admitting: Family Medicine

## 2018-01-23 ENCOUNTER — Other Ambulatory Visit: Payer: Self-pay | Admitting: Family Medicine

## 2018-01-23 DIAGNOSIS — M1990 Unspecified osteoarthritis, unspecified site: Secondary | ICD-10-CM

## 2018-02-18 ENCOUNTER — Other Ambulatory Visit: Payer: Self-pay | Admitting: Family Medicine

## 2018-04-03 ENCOUNTER — Other Ambulatory Visit: Payer: Self-pay | Admitting: Family Medicine

## 2018-04-25 ENCOUNTER — Other Ambulatory Visit: Payer: Self-pay | Admitting: Family Medicine

## 2018-05-01 ENCOUNTER — Ambulatory Visit (INDEPENDENT_AMBULATORY_CARE_PROVIDER_SITE_OTHER): Payer: PPO

## 2018-05-01 DIAGNOSIS — Z23 Encounter for immunization: Secondary | ICD-10-CM | POA: Diagnosis not present

## 2018-06-05 DIAGNOSIS — H401131 Primary open-angle glaucoma, bilateral, mild stage: Secondary | ICD-10-CM | POA: Diagnosis not present

## 2018-06-11 DIAGNOSIS — D3132 Benign neoplasm of left choroid: Secondary | ICD-10-CM | POA: Diagnosis not present

## 2018-06-11 LAB — HM DIABETES EYE EXAM

## 2018-06-24 ENCOUNTER — Other Ambulatory Visit: Payer: Self-pay | Admitting: Family Medicine

## 2018-07-01 ENCOUNTER — Encounter: Payer: Self-pay | Admitting: Family Medicine

## 2018-07-01 ENCOUNTER — Ambulatory Visit (INDEPENDENT_AMBULATORY_CARE_PROVIDER_SITE_OTHER): Payer: PPO | Admitting: Family Medicine

## 2018-07-01 VITALS — BP 140/56 | HR 80 | Temp 97.8°F | Resp 16 | Wt 131.0 lb

## 2018-07-01 DIAGNOSIS — J449 Chronic obstructive pulmonary disease, unspecified: Secondary | ICD-10-CM | POA: Diagnosis not present

## 2018-07-01 DIAGNOSIS — N183 Chronic kidney disease, stage 3 unspecified: Secondary | ICD-10-CM

## 2018-07-01 DIAGNOSIS — E118 Type 2 diabetes mellitus with unspecified complications: Secondary | ICD-10-CM | POA: Diagnosis not present

## 2018-07-01 DIAGNOSIS — M169 Osteoarthritis of hip, unspecified: Secondary | ICD-10-CM | POA: Diagnosis not present

## 2018-07-01 DIAGNOSIS — M810 Age-related osteoporosis without current pathological fracture: Secondary | ICD-10-CM | POA: Diagnosis not present

## 2018-07-01 DIAGNOSIS — E039 Hypothyroidism, unspecified: Secondary | ICD-10-CM | POA: Diagnosis not present

## 2018-07-01 DIAGNOSIS — I10 Essential (primary) hypertension: Secondary | ICD-10-CM

## 2018-07-01 LAB — POCT GLYCOSYLATED HEMOGLOBIN (HGB A1C)
ESTIMATED AVERAGE GLUCOSE: 117
HEMOGLOBIN A1C: 5.7 % — AB (ref 4.0–5.6)

## 2018-07-01 MED ORDER — NAPROXEN 375 MG PO TABS: 375.0000 mg | ORAL_TABLET | Freq: Every day | ORAL | 1 refills | Status: DC | PRN

## 2018-07-01 NOTE — Progress Notes (Signed)
Patient: Robyn Butler Female    DOB: 01/14/1929   84 y.o.   MRN: [id number] Visit Date: 07/01/2018  Today's Provider: Lelon Huh, MD   Chief Complaint  Patient presents with  . Diabetes  . Hypothyroidism  . COPD  . Hypertension   Subjective:     HPI  Diabetes Mellitus Type II, Follow-up:   Lab Results  Component Value Date   HGBA1C 5.7 (A) 01/02/2018   HGBA1C 6.0 07/03/2017   HGBA1C 5.6 01/02/2017    Last seen for diabetes 6 months ago.  Management since then includes no changes. She reports good compliance with treatment. She is not having side effects.  Current symptoms include none and have been stable. Home blood sugar records: fasting range: 150-200  Episodes of hypoglycemia? Occurred once, 2-3 weeks ago   Current Insulin Regimen: none Most Recent Eye Exam: 2019 Weight trend: fluctuating a bit Prior visit with dietician: no Current diet: in general, an "unhealthy" diet Current exercise: none  Pertinent Labs:    Component Value Date/Time   CHOL 112 01/02/2018 1220   CHOL 96 11/10/2012 0512   TRIG 55 01/02/2018 1220   TRIG 68 11/10/2012 0512   HDL 46 01/02/2018 1220   HDL 36 (L) 11/10/2012 0512   LDLCALC 55 01/02/2018 1220   LDLCALC 46 11/10/2012 0512   CREATININE 1.42 (H) 01/02/2018 1220   CREATININE 1.10 11/10/2012 0512    Wt Readings from Last 3 Encounters:  07/01/18 131 lb (59.4 kg)  01/02/18 124 lb (56.2 kg)  12/21/17 124 lb 12.8 oz (56.6 kg)    ------------------------------------------------------------------------  Hypertension, follow-up:  BP Readings from Last 3 Encounters:  07/01/18 (!) 140/56  01/02/18 140/60  12/21/17 (!) 146/50    She was last seen for hypertension 6 months ago.  BP at that visit was 140/60. Management since that visit includes ordering labs which showed slight decline in kidney functions. Patient was advised to drink more water. She reports good compliance with treatment. She is not having  side effects.  She is not exercising. She is not adherent to low salt diet.   Outside blood pressures are checked occasionally. She is experiencing chest pain and dyspnea.  Patient denies lower extremity edema, near-syncope, syncope and tachypnea.   Cardiovascular risk factors include advanced age (older than 5 for men, 10 for women), diabetes mellitus and hypertension.  Use of agents associated with hypertension: NSAIDS.     Weight trend: fluctuating a bit Wt Readings from Last 3 Encounters:  07/01/18 131 lb (59.4 kg)  01/02/18 124 lb (56.2 kg)  12/21/17 124 lb 12.8 oz (56.6 kg)    Current diet: in general, an "unhealthy" diet  ------------------------------------------------------------------------ COPD: Patient was last seen for this problem 6 months ago and no changes were made. Patient reports this problem is stable. Patient reports that her current inhalers, are not working well to control symptoms.   Hypothyroidism: Patient was last seen for this problem 6 months ago and no changes were made. Patient reports good compliance with treatment.   Complains of left hip pain for the last two weeks. Was seen by orthopedist in past and had several injections which didn't help. Worse when walking and standing for prolonged periods.  Tramadol helps a little bit. Is taking OTC Advil PM which helps somewhat.    Allergies  Allergen Reactions  . Alendronate Nausea And Vomiting    Other reaction(s): Vomiting     Current Outpatient Medications:  .  amLODipine (NORVASC) 5 MG tablet, TAKE ONE (1) TABLET EACH DAY, Disp: 90 tablet, Rfl: 4 .  aspirin EC 81 MG tablet, Take 81 mg by mouth daily. , Disp: , Rfl:  .  Blood Glucose Monitoring Suppl (ONE TOUCH ULTRA 2) w/Device KIT, Use to check blood sugar daily, Disp: 1 each, Rfl: 0 .  dorzolamide (TRUSOPT) 2 % ophthalmic solution, 1 drop 2 (two) times daily., Disp: , Rfl:  .  glipiZIDE-metformin (METAGLIP) 2.5-500 MG tablet, TAKE ONE TABLET  EVERY MORNING, Disp: 90 tablet, Rfl: 4 .  ibandronate (BONIVA) 150 MG tablet, TAKE ONE TABLET ONCE A MONTH FIRST THING IN THE MORNING AT LEAST 1 HOUR BEFORE EATING, TAKE WITH WATER., Disp: 1 tablet, Rfl: 11 .  Lancets (ONETOUCH ULTRASOFT) lancets, Use as instructed to check blood sugar once a day, Disp: 100 each, Rfl: 3 .  latanoprost (XALATAN) 0.005 % ophthalmic solution, 1 drop at bedtime., Disp: , Rfl:  .  levothyroxine (SYNTHROID, LEVOTHROID) 100 MCG tablet, TAKE ONE (1) TABLET BY MOUTH EVERY DAY, Disp: 90 tablet, Rfl: 3 .  lovastatin (MEVACOR) 40 MG tablet, TAKE ONE (1) TABLET AT BEDTIME, Disp: 90 tablet, Rfl: 1 .  nortriptyline (PAMELOR) 25 MG capsule, TAKE ONE CAPSULE BY MOUTH AT BEDTIME, Disp: 30 capsule, Rfl: 11 .  omeprazole (PRILOSEC) 40 MG capsule, Take 1 capsule (40 mg total) by mouth daily., Disp: 30 capsule, Rfl: 11 .  ONE TOUCH ULTRA TEST test strip, TEST SUGAR TWICE DAILY, Disp: 100 each, Rfl: 4 .  Polyethylene Glycol 3350 (DULCOLAX BALANCE PO), Take 2 tablets by mouth as needed., Disp: , Rfl:  .  SPIRIVA RESPIMAT 2.5 MCG/ACT AERS, USE 2 PUFFS EVERY DAY, Disp: 4 Inhaler, Rfl: 3 .  traMADol (ULTRAM) 50 MG tablet, TAKE ONE TABLET EVERY 8 HOUR AS NEEDED FOR PAIN, Disp: 30 tablet, Rfl: 3 .  valsartan-hydrochlorothiazide (DIOVAN-HCT) 160-25 MG tablet, Take 1 tablet by mouth daily., Disp: 30 tablet, Rfl: 11 .  cetirizine (ZYRTEC) 10 MG tablet, Take 10 mg by mouth daily., Disp: , Rfl:  .  diclofenac sodium (VOLTAREN) 1 % GEL, Voltaren 1 % topical gel  APPLY 2 GRAM TO THE AFFECTED AREA(S) BY TOPICAL ROUTE 4 TIMES PER DAY, Disp: , Rfl:  .  gentamicin cream (GARAMYCIN) 0.1 %, Apply 1 application topically 3 (three) times daily. (Patient not taking: Reported on 07/01/2018), Disp: 30 g, Rfl: 1 .  loperamide (IMODIUM) 2 MG capsule, Take 2 mg by mouth as needed for diarrhea or loose stools., Disp: , Rfl:  .  montelukast (SINGULAIR) 10 MG tablet, Take 1 tablet (10 mg total) by mouth daily. For  chronic cough (Patient not taking: Reported on 07/01/2018), Disp: 30 tablet, Rfl: 2  Review of Systems  Constitutional: Negative for appetite change, chills, fatigue and fever.  Respiratory: Positive for shortness of breath. Negative for chest tightness.   Cardiovascular: Positive for chest pain. Negative for palpitations.  Gastrointestinal: Negative for abdominal pain, nausea and vomiting.  Neurological: Negative for dizziness and weakness.    Social History   Tobacco Use  . Smoking status: Former Smoker    Last attempt to quit: 07/18/1991    Years since quitting: 26.9  . Smokeless tobacco: Never Used  Substance Use Topics  . Alcohol use: No      Objective:   BP (!) 140/56 (BP Location: Left Arm, Patient Position: Sitting, Cuff Size: Normal)   Pulse 80   Temp 97.8 F (36.6 C) (Oral)   Resp 16   Wt 131  lb (59.4 kg)   SpO2 95% Comment: room air  BMI 23.96 kg/m  Vitals:   07/01/18 1124  BP: (!) 140/56  Pulse: 80  Resp: 16  Temp: 97.8 F (36.6 C)  TempSrc: Oral  SpO2: 95%  Weight: 131 lb (59.4 kg)     Physical Exam   General Appearance:    Alert, cooperative, no distress  Eyes:    PERRL, conjunctiva/corneas clear, EOM's intact       Lungs:     Clear to auscultation bilaterally, respirations unlabored  Heart:    Regular rate and rhythm  Neurologic:   Awake, alert, oriented x 3. No apparent focal neurological           defect.       Results for orders placed or performed in visit on 07/01/18  POCT HgB A1C  Result Value Ref Range   Hemoglobin A1C 5.7 (A) 4.0 - 5.6 %   HbA1c POC (<> result, manual entry)     HbA1c, POC (prediabetic range)     HbA1c, POC (controlled diabetic range)     Est. average glucose Bld gHb Est-mCnc 117        Assessment & Plan    1. Controlled type 2 diabetes mellitus with complication, without long-term current use of insulin ([place]) Well controlled.  Continue current medications.   - POCT HgB A1C  2. Essential (primary)  hypertension Stable. Continue current medications.    3. Chronic obstructive pulmonary disease, unspecified COPD type ([place]) Doing well with current inhalers.   4. Adult hypothyroidism  - TSH  5. Chronic kidney disease (CKD), stage III (moderate) (HCC)  - Renal function panel - VITAMIN D 25 Hydroxy (Vit-D Deficiency, Fractures)  6. Hypomagnesemia  - Magnesium  7. Osteoporosis, unspecified osteoporosis type, unspecified pathological fracture presence  - VITAMIN D 25 Hydroxy (Vit-D Deficiency, Fractures)  8. Osteoarthritis of hip, unspecified laterality, unspecified osteoarthritis type  - naproxen (NAPROSYN) 375 MG tablet; Take 1 tablet (375 mg total) by mouth daily as needed for mild pain.  Dispense: 30 tablet; Refill: 1     Lelon Huh, MD  [facility]

## 2018-07-02 ENCOUNTER — Telehealth: Payer: Self-pay | Admitting: Family Medicine

## 2018-07-02 DIAGNOSIS — E039 Hypothyroidism, unspecified: Secondary | ICD-10-CM | POA: Diagnosis not present

## 2018-07-02 DIAGNOSIS — M810 Age-related osteoporosis without current pathological fracture: Secondary | ICD-10-CM | POA: Diagnosis not present

## 2018-07-02 DIAGNOSIS — N183 Chronic kidney disease, stage 3 (moderate): Secondary | ICD-10-CM | POA: Diagnosis not present

## 2018-07-02 NOTE — Telephone Encounter (Signed)
Pt calling regarding the naproxen (NAPROSYN) 375 MG tablet she was prescribed. She has some questions on what she can take with the Rx.  Please advise.  Thanks, [employer]

## 2018-07-02 NOTE — Telephone Encounter (Signed)
Please call pt back to discuss asap.  Thanks, [employer]

## 2018-07-03 LAB — RENAL FUNCTION PANEL
Albumin: 4.1 g/dL (ref 3.5–4.7)
BUN / CREAT RATIO: 22 (ref 12–28)
BUN: 28 mg/dL — ABNORMAL HIGH (ref 8–27)
CO2: 21 mmol/L (ref 20–29)
Calcium: 9.1 mg/dL (ref 8.7–10.3)
Chloride: 107 mmol/L — ABNORMAL HIGH (ref 96–106)
Creatinine, Ser: 1.3 mg/dL — ABNORMAL HIGH (ref 0.57–1.00)
GFR calc Af Amer: 43 mL/min/{1.73_m2} — ABNORMAL LOW (ref >59)
GFR calc non Af Amer: 37 mL/min/{1.73_m2} — ABNORMAL LOW (ref >59)
Glucose: 140 mg/dL — ABNORMAL HIGH (ref 65–99)
Phosphorus: 3.2 mg/dL (ref 2.5–4.5)
Potassium: 4.8 mmol/L (ref 3.5–5.2)
SODIUM: 141 mmol/L (ref 134–144)

## 2018-07-03 LAB — MAGNESIUM: Magnesium: 1.6 mg/dL (ref 1.6–2.3)

## 2018-07-03 LAB — TSH: TSH: 1.7 u[IU]/mL (ref 0.450–4.500)

## 2018-07-03 LAB — VITAMIN D 25 HYDROXY (VIT D DEFICIENCY, FRACTURES): Vit D, 25-Hydroxy: 14.3 ng/mL — ABNORMAL LOW (ref 30.0–100.0)

## 2018-07-22 ENCOUNTER — Other Ambulatory Visit: Payer: Self-pay

## 2018-07-22 DIAGNOSIS — K219 Gastro-esophageal reflux disease without esophagitis: Secondary | ICD-10-CM

## 2018-07-22 MED ORDER — OMEPRAZOLE 40 MG PO CPDR: 40.0000 mg | DELAYED_RELEASE_CAPSULE | Freq: Every day | ORAL | 11 refills | Status: DC

## 2018-07-22 NOTE — Telephone Encounter (Signed)
Patient reports that she will be using [facility] instead of [facility].

## 2018-08-24 ENCOUNTER — Other Ambulatory Visit: Payer: Self-pay | Admitting: Family Medicine

## 2018-09-17 ENCOUNTER — Other Ambulatory Visit: Payer: Self-pay

## 2018-09-17 DIAGNOSIS — K219 Gastro-esophageal reflux disease without esophagitis: Secondary | ICD-10-CM

## 2018-09-17 DIAGNOSIS — M1990 Unspecified osteoarthritis, unspecified site: Secondary | ICD-10-CM

## 2018-09-17 DIAGNOSIS — M169 Osteoarthritis of hip, unspecified: Secondary | ICD-10-CM

## 2018-09-17 MED ORDER — NAPROXEN 375 MG PO TABS: 375.0000 mg | ORAL_TABLET | Freq: Every day | ORAL | 2 refills | Status: DC | PRN

## 2018-09-17 MED ORDER — IBANDRONATE SODIUM 150 MG PO TABS: ORAL_TABLET | ORAL | 3 refills | Status: DC

## 2018-09-17 MED ORDER — VALSARTAN-HYDROCHLOROTHIAZIDE 160-25 MG PO TABS: 1.0000 | ORAL_TABLET | Freq: Every day | ORAL | 4 refills | Status: DC

## 2018-09-17 MED ORDER — TRAMADOL HCL 50 MG PO TABS: ORAL_TABLET | ORAL | 1 refills | Status: DC

## 2018-09-17 MED ORDER — OMEPRAZOLE 40 MG PO CPDR: 40.0000 mg | DELAYED_RELEASE_CAPSULE | Freq: Every day | ORAL | 4 refills | Status: DC

## 2018-09-17 MED ORDER — NORTRIPTYLINE HCL 25 MG PO CAPS: 25.0000 mg | ORAL_CAPSULE | Freq: Every day | ORAL | 3 refills | Status: DC

## 2018-09-17 MED ORDER — TIOTROPIUM BROMIDE MONOHYDRATE 2.5 MCG/ACT IN AERS: INHALATION_SPRAY | RESPIRATORY_TRACT | 4 refills | Status: DC

## 2018-09-17 NOTE — Telephone Encounter (Signed)
Patient would like all these prescription refilled with a 90 day supply. She states that she saw on the news that some medications that are manufactured in [place], will be scarce due to the [place]. Patient usually gets 30 days supply, and she fears that she may run out.   ([place])

## 2018-12-10 DIAGNOSIS — H401131 Primary open-angle glaucoma, bilateral, mild stage: Secondary | ICD-10-CM | POA: Diagnosis not present

## 2018-12-24 ENCOUNTER — Other Ambulatory Visit: Payer: Self-pay

## 2018-12-24 ENCOUNTER — Ambulatory Visit (INDEPENDENT_AMBULATORY_CARE_PROVIDER_SITE_OTHER): Payer: PPO

## 2018-12-24 DIAGNOSIS — Z0000 Encounter for general adult medical examination without abnormal findings: Secondary | ICD-10-CM | POA: Diagnosis not present

## 2018-12-24 NOTE — Progress Notes (Signed)
Subjective:   Robyn Butler is a 85 y.o. female who presents for Medicare Annual (Subsequent) preventive examination.    This visit is being conducted through telemedicine due to the COVID-19 pandemic. This patient has given me verbal consent via doximity to conduct this visit, patient states they are participating from their home address. Some vital signs may be absent or patient reported.    Patient identification: identified by name, DOB, and current address  Review of Systems:  N/A  Cardiac Risk Factors include: advanced age (>59mn, >>91women);dyslipidemia;diabetes mellitus     Objective:     Vitals: There were no vitals taken for this visit.  There is no height or weight on file to calculate BMI. Unable to obtain vitals due to visit being conducted via telephonically.   Advanced Directives 12/24/2018 12/21/2017 12/19/2016  Does Patient Have a Medical Advance Directive? Yes No No  Type of AParamedicof [place] will - -  Copy of [place] Chart? No - copy requested - -  Would patient like information on creating a medical advance directive? - No - Patient declined No - Patient declined    Tobacco Social History   Tobacco Use  Smoking Status Former Smoker  . Last attempt to quit: 07/18/1991  . Years since quitting: 27.4  Smokeless Tobacco Never Used     Counseling given: Not Answered   Clinical Intake:  Pre-visit preparation completed: Yes  Pain : No/denies pain Pain Score: 0-No pain     Nutritional Status: BMI of 19-24  Normal Nutritional Risks: Nausea/ vomitting/ diarrhea(Diarrhea occasionally - unsure of cause. ) Diabetes: Yes  How often do you need to have someone help you when you read instructions, pamphlets, or other written materials from your doctor or pharmacy?: 1 - Never   Diabetes:  Is the patient diabetic?  Yes type 2 If diabetic, was a CBG obtained today?  No  Did the patient bring in their  glucometer from home?  No  How often do you monitor your CBG's? 3-4 times daily depending on diet.   Financial Strains and Diabetes Management:  Are you having any financial strains with the device, your supplies or your medication? No .  Does the patient want to be seen by Chronic Care Management for management of their diabetes?  No  Would the patient like to be referred to a Nutritionist or for Diabetic Management?  No   Diabetic Exams:  Diabetic Eye Exam: Completed 06/11/18. Complete yearly.   Diabetic Foot Exam: Completed 01/02/17. Pt has been advised about the importance in completing this exam. Note made to f/u on this at next in office visit.     Interpreter Needed?: No  Information entered by :: [facility] L[facility]  Past Medical History:  Diagnosis Date  . Arthritis   . Cancer ([place]   . Chronic kidney disease   . COPD (chronic obstructive pulmonary disease) ([place]   . Diabetes mellitus without complication ([place]    type 2  . Hypertension   . Varicose veins of lower extremities with other complications    Past Surgical History:  Procedure Laterality Date  . ABDOMINAL HYSTERECTOMY  1972  . BLADDER SUSPENSION    . BREAST EXCISIONAL BIOPSY Right 1999   neg  . CHOLECYSTECTOMY  1975  . COLONOSCOPY    . COLONOSCOPY WITH PROPOFOL N/A 05/24/2015   Procedure: COLONOSCOPY WITH PROPOFOL;  Surgeon: PHulen Luster MD;  [facility];  Service: Gastroenterology;  Laterality: N/A;  . [place]  . ESOPHAGOGASTRODUODENOSCOPY N/A 05/24/2015   Procedure: ESOPHAGOGASTRODUODENOSCOPY (EGD);  Surgeon: Hulen Luster, MD;  Location[facility];  Service: Gastroenterology;  Laterality: N/A;  . FRACTURE SURGERY    . JOINT REPLACEMENT Right 2013   hip  . salpingo oophorectmy     . THYROIDECTOMY  1969  . WRIST FRACTURE SURGERY Left    Family History  Problem Relation Age of Onset  . Heart attack Father   . Asthma Father   . Kidney disease Mother   . Hypertension Mother   .  Hypertension Other   . Diabetes Other   . Heart attack Other   . Breast cancer Neg Hx    Social History   Socioeconomic History  . Marital status: Widowed    Spouse name: Not on file  . Number of children: 2  . Years of education: 9th Grade  . Highest education level: 9th grade  Occupational History  . Occupation: Retired  Scientific laboratory technician  . Financial resource strain: Not hard at all  . Food insecurity:    Worry: Never true    Inability: Never true  . Transportation needs:    Medical: No    Non-medical: No  Tobacco Use  . Smoking status: Former Smoker    Last attempt to quit: 07/18/1991    Years since quitting: 27.4  . Smokeless tobacco: Never Used  Substance and Sexual Activity  . Alcohol use: No  . Drug use: No  . Sexual activity: Not on file  Lifestyle  . Physical activity:    Days per week: 0 days    Minutes per session: 0 min  . Stress: Not at all  Relationships  . Social connections:    Talks on phone: Patient refused    Gets together: Patient refused    Attends religious service: Patient refused    Active member of club or organization: Patient refused    Attends meetings of clubs or organizations: Patient refused    Relationship status: Patient refused  Other Topics Concern  . Not on file  Social History Narrative  . Not on file    Outpatient Encounter Medications as of 12/24/2018  Medication Sig  . amLODipine (NORVASC) 5 MG tablet TAKE ONE (1) TABLET EACH DAY  . aspirin EC 81 MG tablet Take 81 mg by mouth daily.   . Blood Glucose Monitoring Suppl (ONE TOUCH ULTRA 2) w/Device KIT Use to check blood sugar daily  . dorzolamide (TRUSOPT) 2 % ophthalmic solution Place 1 drop into both eyes 2 (two) times daily.   Marland Kitchen glipiZIDE-metformin (METAGLIP) 2.5-500 MG tablet TAKE ONE TABLET EVERY MORNING  . ibandronate (BONIVA) 150 MG tablet TAKE ONE TABLET ONCE A MONTH FIRST THING IN THE MORNING AT LEAST 1 HOUR BEFORE EATING, TAKE WITH WATER.  . Ibuprofen-diphenhydrAMINE  Cit (ADVIL PM) 200-38 MG TABS Take 1 tablet by mouth at bedtime as needed.  . Lancets (ONETOUCH ULTRASOFT) lancets Use as instructed to check blood sugar once a day  . latanoprost (XALATAN) 0.005 % ophthalmic solution Place 1 drop into both eyes at bedtime.   Marland Kitchen levothyroxine (SYNTHROID, LEVOTHROID) 100 MCG tablet TAKE ONE (1) TABLET BY MOUTH EVERY DAY  . loperamide (IMODIUM) 2 MG capsule Take 2 mg by mouth as needed for diarrhea or loose stools.  . lovastatin (MEVACOR) 40 MG tablet TAKE 1 TABLET BY MOUTH AT BEDTIME  . montelukast (SINGULAIR) 10 MG tablet Take 1 tablet (10 mg total) by mouth  daily. For chronic cough  . naproxen (NAPROSYN) 375 MG tablet Take 1 tablet (375 mg total) by mouth daily as needed for mild pain.  . nortriptyline (PAMELOR) 25 MG capsule Take 1 capsule (25 mg total) by mouth at bedtime.  Marland Kitchen omeprazole (PRILOSEC) 40 MG capsule Take 1 capsule (40 mg total) by mouth daily.  . ONE TOUCH ULTRA TEST test strip TEST SUGAR TWICE DAILY  . Polyethylene Glycol 3350 (DULCOLAX BALANCE PO) Take 2 tablets by mouth as needed.  . Tiotropium Bromide Monohydrate (SPIRIVA RESPIMAT) 2.5 MCG/ACT AERS USE 2 PUFFS EVERY DAY  . traMADol (ULTRAM) 50 MG tablet TAKE ONE TABLET EVERY 8 HOUR AS NEEDED FOR PAIN  . valsartan-hydrochlorothiazide (DIOVAN-HCT) 160-25 MG tablet Take 1 tablet by mouth daily.  . cetirizine (ZYRTEC) 10 MG tablet Take 10 mg by mouth daily.  . diclofenac sodium (VOLTAREN) 1 % GEL Voltaren 1 % topical gel  APPLY 2 GRAM TO THE AFFECTED AREA(S) BY TOPICAL ROUTE 4 TIMES PER DAY  . gentamicin cream (GARAMYCIN) 0.1 % Apply 1 application topically 3 (three) times daily. (Patient not taking: Reported on 07/01/2018)   No facility-administered encounter medications on file as of 12/24/2018.     Activities of Daily Living In your present state of health, do you have any difficulty performing the following activities: 12/24/2018  Hearing? Y  Comment Wears bilateral hearing aids.   Vision? Y   Comment Due to having glaucoma in both eyes.   Difficulty concentrating or making decisions? N  Walking or climbing stairs? N  Comment Avoids steps.  Dressing or bathing? N  Doing errands, shopping? N  Preparing Food and eating ? N  Using the Toilet? N  In the past six months, have you accidently leaked urine? Y  Comment Wears protection daily.   Do you have problems with loss of bowel control? Y  Comment Wears protection daily.   Managing your Medications? N  Managing your Finances? N  Housekeeping or managing your Housekeeping? N  Some recent data might be hidden    Patient Care Team: Birdie Sons, MD as PCP - General (Family Medicine) Dingeldein, Remo Lipps, MD as Consulting Physician (Ophthalmology)    Assessment:   This is a routine wellness examination for Robyn Butler.  Exercise Activities and Dietary recommendations Current Exercise Habits: The patient does not participate in regular exercise at present, Exercise limited by: orthopedic condition(s)  Goals    . DIET - INCREASE WATER INTAKE     Recommend increasing water intake to 4-6 glasses a day.     . Increase water intake     Recommend increasing water intake to 6 glasses a day. Also substituting a glass of tea for water.        Fall Risk: Fall Risk  12/24/2018 12/21/2017 12/19/2016 06/27/2016 04/28/2015  Falls in the past year? 0 No No No No    FALL RISK PREVENTION PERTAINING TO THE HOME:  Any stairs in or around the home? No  If so, are there any without handrails? N/A  Home free of loose throw rugs in walkways, pet beds, electrical cords, etc? Yes  Adequate lighting in your home to reduce risk of falls? Yes   ASSISTIVE DEVICES UTILIZED TO PREVENT FALLS:  Life alert? Yes  Use of a cane, walker or w/c? No  Grab bars in the bathroom? Yes  Shower chair or bench in shower? No  Elevated toilet seat or a handicapped toilet? Yes   TIMED UP AND GO:  Was the  test performed? No .    Depression Screen PHQ 2/9  Scores 12/24/2018 12/24/2018 12/21/2017 12/19/2016  PHQ - 2 Score 0 0 0 2  PHQ- 9 Score 0 - - 8     Cognitive Function     6CIT Screen 12/24/2018 12/21/2017 12/19/2016  What Year? 0 points 0 points 0 points  What month? 0 points 0 points 0 points  What time? 0 points 0 points 0 points  Count back from 20 0 points 0 points 0 points  Months in reverse 0 points 2 points 0 points  Repeat phrase 2 points 2 points 6 points  Total Score _0 Immunization History  Administered Date(s) Administered  . Influenza, High Dose Seasonal PF 04/28/2015, 04/20/2016, 05/10/2017, 05/01/2018  . Pneumococcal Conjugate-13 11/26/2013  . Pneumococcal Polysaccharide-23 05/16/2003  . Td 04/02/1995  . Zoster 07/20/2011    Qualifies for Shingles Vaccine? Yes  Zostavax completed 07/20/11. Due for Shingrix. Education has been provided regarding the importance of this vaccine. Pt has been advised to call insurance company to determine out of pocket expense. Advised may also receive vaccine at local pharmacy or Health Dept. Verbalized acceptance and understanding.  Tdap: Although this vaccine is not a covered service during a Wellness Exam, does the patient still wish to receive this vaccine today?  No . Advised may receive this vaccine at local pharmacy or Health Dept. Aware to provide a copy of the vaccination record if obtained from local pharmacy or Health Dept. Verbalized acceptance and understanding.  Flu Vaccine: Up to date  Pneumococcal Vaccine: Completed series  Screening Tests Health Maintenance  Topic Date Due  . FOOT EXAM  01/02/2018  . TETANUS/TDAP  07/17/2026 (Originally 04/01/2005)  . HEMOGLOBIN A1C  12/31/2018  . INFLUENZA VACCINE  02/15/2019  . OPHTHALMOLOGY EXAM  06/12/2019  . DEXA SCAN  08/10/2019  . PNA vac Low Risk Adult  Completed    Cancer Screenings:  Colorectal Screening: No longer required.   Mammogram: No longer required.   Bone Density: Completed 08/09/17. Results reflect  OSTEOPOROSIS. Repeat every 2 years.   Lung Cancer Screening: (Low Dose CT Chest recommended if Age 21-80 years, 30 pack-year currently smoking OR have quit w/in 15years.) does not qualify.   Additional Screening:   Dental Screening: Recommended annual dental exams for proper oral hygiene   Community Resource Referral:  CRR required this visit?  No       Plan:  I have personally reviewed and addressed the Medicare Annual Wellness questionnaire and have noted the following in the patient's chart:  A. Medical and social history B. Use of alcohol, tobacco or illicit drugs  C. Current medications and supplements D. Functional ability and status E.  Nutritional status F.  Physical activity G. Advance directives H. List of other physicians I.  Hospitalizations, surgeries, and ER visits in previous 12 months J.  [place] such as hearing and vision if needed, cognitive and depression L. Referrals and appointments   In addition, I have reviewed and discussed with patient certain preventive protocols, quality metrics, and best practice recommendations. A written personalized care plan for preventive services as well as general preventive health recommendations were provided to patient.   Glendora Score, [place]  08/26/5619 Nurse Health Advisor   Nurse Notes: Pt needs a diabetic foot exam at next in office visit.

## 2018-12-24 NOTE — Patient Instructions (Addendum)
Robyn Butler , Thank you for taking time to come for your Medicare Wellness Visit. I appreciate your ongoing commitment to your health goals. Please review the following plan we discussed and let me know if I can assist you in the future.   Screening recommendations/referrals: Colonoscopy: No longer required.  Mammogram: No longer required.  Bone Density: Up to date, due 07/2019 Recommended yearly ophthalmology/optometry visit for glaucoma screening and checkup Recommended yearly dental visit for hygiene and checkup  Vaccinations: Influenza vaccine: Up to date Pneumococcal vaccine: Completed series Tdap vaccine: Pt declines today.  Shingles vaccine: Pt declines today.     Advanced directives: Unable to provide a copy for the clinic.   Conditions/risks identified: Continue to increase water intake to 6-8 8 oz glasses a day.   Next appointment: 01/07/19 with Dr Caryn Section. Declined scheduling an AWV for 2021 at this time.    Preventive Care 83 Years and Older, Female Preventive care refers to lifestyle choices and visits with your health care provider that can promote health and wellness. What does preventive care include?  A yearly physical exam. This is also called an annual well check.  Dental exams once or twice a year.  Routine eye exams. Ask your health care provider how often you should have your eyes checked.  Personal lifestyle choices, including:  Daily care of your teeth and gums.  Regular physical activity.  Eating a healthy diet.  Avoiding tobacco and drug use.  Limiting alcohol use.  Practicing safe sex.  Taking low-dose aspirin every day.  Taking vitamin and mineral supplements as recommended by your health care provider. What happens during an annual well check? The services and screenings done by your health care provider during your annual well check will depend on your age, overall health, lifestyle risk factors, and family history of disease. Counseling   Your health care provider may ask you questions about your:  Alcohol use.  Tobacco use.  Drug use.  Emotional well-being.  Home and relationship well-being.  Sexual activity.  Eating habits.  History of falls.  Memory and ability to understand (cognition).  Work and work Statistician.  Reproductive health. Screening  You may have the following tests or measurements:  Height, weight, and BMI.  Blood pressure.  Lipid and cholesterol levels. These may be checked every 5 years, or more frequently if you are over 51 years old.  Skin check.  Lung cancer screening. You may have this screening every year starting at age 29 if you have a 30-pack-year history of smoking and currently smoke or have quit within the past 15 years.  Fecal occult blood test (FOBT) of the stool. You may have this test every year starting at age 55.  Flexible sigmoidoscopy or colonoscopy. You may have a sigmoidoscopy every 5 years or a colonoscopy every 10 years starting at age 67.  Hepatitis C blood test.  Hepatitis B blood test.  Sexually transmitted disease (STD) testing.  Diabetes screening. This is done by checking your blood sugar (glucose) after you have not eaten for a while (fasting). You may have this done every 1-3 years.  Bone density scan. This is done to screen for osteoporosis. You may have this done starting at age 63.  Mammogram. This may be done every 1-2 years. Talk to your health care provider about how often you should have regular mammograms. Talk with your health care provider about your test results, treatment options, and if necessary, the need for more tests. Vaccines  Your  health care provider may recommend certain vaccines, such as:  Influenza vaccine. This is recommended every year.  Tetanus, diphtheria, and acellular pertussis (Tdap, Td) vaccine. You may need a Td booster every 10 years.  Zoster vaccine. You may need this after age 40.  Pneumococcal 13-valent  conjugate (PCV13) vaccine. One dose is recommended after age 41.  Pneumococcal polysaccharide (PPSV23) vaccine. One dose is recommended after age 13. Talk to your health care provider about which screenings and vaccines you need and how often you need them. This information is not intended to replace advice given to you by your health care provider. Make sure you discuss any questions you have with your health care provider. Document Released: 07/30/2015 Document Revised: 03/22/2016 Document Reviewed: 05/04/2015 Elsevier Interactive Patient Education  2017 [place] Prevention in the Home Falls can cause injuries. They can happen to people of all ages. There are many things you can do to make your home safe and to help prevent falls. What can I do on the outside of my home?  Regularly fix the edges of walkways and driveways and fix any cracks.  Remove anything that might make you trip as you walk through a door, such as a raised step or threshold.  Trim any bushes or trees on the path to your home.  Use bright outdoor lighting.  Clear any walking paths of anything that might make someone trip, such as rocks or tools.  Regularly check to see if handrails are loose or broken. Make sure that both sides of any steps have handrails.  Any raised decks and porches should have guardrails on the edges.  Have any leaves, snow, or ice cleared regularly.  Use sand or salt on walking paths during winter.  Clean up any spills in your garage right away. This includes oil or grease spills. What can I do in the bathroom?  Use night lights.  Install grab bars by the toilet and in the tub and shower. Do not use towel bars as grab bars.  Use non-skid mats or decals in the tub or shower.  If you need to sit down in the shower, use a plastic, non-slip stool.  Keep the floor dry. Clean up any water that spills on the floor as soon as it happens.  Remove soap buildup in the tub or  shower regularly.  Attach bath mats securely with double-sided non-slip rug tape.  Do not have throw rugs and other things on the floor that can make you trip. What can I do in the bedroom?  Use night lights.  Make sure that you have a light by your bed that is easy to reach.  Do not use any sheets or blankets that are too big for your bed. They should not hang down onto the floor.  Have a firm chair that has side arms. You can use this for support while you get dressed.  Do not have throw rugs and other things on the floor that can make you trip. What can I do in the kitchen?  Clean up any spills right away.  Avoid walking on wet floors.  Keep items that you use a lot in easy-to-reach places.  If you need to reach something above you, use a strong step stool that has a grab bar.  Keep electrical cords out of the way.  Do not use floor polish or wax that makes floors slippery. If you must use wax, use non-skid floor wax.  Do not  have throw rugs and other things on the floor that can make you trip. What can I do with my stairs?  Do not leave any items on the stairs.  Make sure that there are handrails on both sides of the stairs and use them. Fix handrails that are broken or loose. Make sure that handrails are as long as the stairways.  Check any carpeting to make sure that it is firmly attached to the stairs. Fix any carpet that is loose or worn.  Avoid having throw rugs at the top or bottom of the stairs. If you do have throw rugs, attach them to the floor with carpet tape.  Make sure that you have a light switch at the top of the stairs and the bottom of the stairs. If you do not have them, ask someone to add them for you. What else can I do to help prevent falls?  Wear shoes that:  Do not have high heels.  Have rubber bottoms.  Are comfortable and fit you well.  Are closed at the toe. Do not wear sandals.  If you use a stepladder:  Make sure that it is fully  opened. Do not climb a closed stepladder.  Make sure that both sides of the stepladder are locked into place.  Ask someone to hold it for you, if possible.  Clearly mark and make sure that you can see:  Any grab bars or handrails.  First and last steps.  Where the edge of each step is.  Use tools that help you move around (mobility aids) if they are needed. These include:  Canes.  Walkers.  Scooters.  Crutches.  Turn on the lights when you go into a dark area. Replace any light bulbs as soon as they burn out.  Set up your furniture so you have a clear path. Avoid moving your furniture around.  If any of your floors are uneven, fix them.  If there are any pets around you, be aware of where they are.  Review your medicines with your doctor. Some medicines can make you feel dizzy. This can increase your chance of falling. Ask your doctor what other things that you can do to help prevent falls. This information is not intended to replace advice given to you by your health care provider. Make sure you discuss any questions you have with your health care provider. Document Released: 04/29/2009 Document Revised: 12/09/2015 Document Reviewed: 08/07/2014 Elsevier Interactive Patient Education  2017 [employer].

## 2018-12-31 ENCOUNTER — Ambulatory Visit: Payer: Self-pay | Admitting: Family Medicine

## 2019-01-07 ENCOUNTER — Other Ambulatory Visit: Payer: Self-pay

## 2019-01-07 ENCOUNTER — Ambulatory Visit (INDEPENDENT_AMBULATORY_CARE_PROVIDER_SITE_OTHER): Payer: PPO | Admitting: Family Medicine

## 2019-01-07 ENCOUNTER — Encounter: Payer: Self-pay | Admitting: Family Medicine

## 2019-01-07 VITALS — BP 146/50 | HR 79 | Temp 98.3°F | Resp 16 | Wt 130.0 lb

## 2019-01-07 DIAGNOSIS — N183 Chronic kidney disease, stage 3 unspecified: Secondary | ICD-10-CM

## 2019-01-07 DIAGNOSIS — J449 Chronic obstructive pulmonary disease, unspecified: Secondary | ICD-10-CM | POA: Diagnosis not present

## 2019-01-07 DIAGNOSIS — E785 Hyperlipidemia, unspecified: Secondary | ICD-10-CM | POA: Diagnosis not present

## 2019-01-07 DIAGNOSIS — I10 Essential (primary) hypertension: Secondary | ICD-10-CM | POA: Diagnosis not present

## 2019-01-07 DIAGNOSIS — E1149 Type 2 diabetes mellitus with other diabetic neurological complication: Secondary | ICD-10-CM

## 2019-01-07 DIAGNOSIS — E039 Hypothyroidism, unspecified: Secondary | ICD-10-CM | POA: Diagnosis not present

## 2019-01-07 DIAGNOSIS — M810 Age-related osteoporosis without current pathological fracture: Secondary | ICD-10-CM

## 2019-01-07 DIAGNOSIS — D539 Nutritional anemia, unspecified: Secondary | ICD-10-CM | POA: Diagnosis not present

## 2019-01-07 DIAGNOSIS — D649 Anemia, unspecified: Secondary | ICD-10-CM | POA: Diagnosis not present

## 2019-01-07 DIAGNOSIS — E118 Type 2 diabetes mellitus with unspecified complications: Secondary | ICD-10-CM | POA: Diagnosis not present

## 2019-01-07 MED ORDER — TRELEGY ELLIPTA 100-62.5-25 MCG/INH IN AEPB: 1.0000 | INHALATION_SPRAY | Freq: Every day | RESPIRATORY_TRACT | 0 refills | Status: DC

## 2019-01-07 NOTE — Progress Notes (Signed)
Patient: Robyn Butler Female    DOB: 18-Jul-1928   85 y.o.   MRN: [id number] Visit Date: 01/07/2019  Today's Provider: Lelon Huh, MD   No chief complaint on file.  Subjective:     HPI  Diabetes Mellitus Type II, Follow-up:   Lab Results  Component Value Date   HGBA1C 5.7 (A) 07/01/2018   HGBA1C 5.7 (A) 01/02/2018   HGBA1C 6.0 07/03/2017    Last seen for diabetes 6 months ago.  Management since then includes no changes. She reports good compliance with treatment. She is not having side effects.  Current symptoms include none and have been stable. Home blood sugar records: fasting range: 120-200  Episodes of hypoglycemia? no   Current Insulin Regimen: none Most Recent Eye Exam: UTD Weight trend: stable Prior visit with dietician: No Current exercise: none Current diet habits: regular diet  Pertinent Labs:    Component Value Date/Time   CHOL 112 01/02/2018 1220   CHOL 96 11/10/2012 0512   TRIG 55 01/02/2018 1220   TRIG 68 11/10/2012 0512   HDL 46 01/02/2018 1220   HDL 36 (L) 11/10/2012 0512   LDLCALC 55 01/02/2018 1220   LDLCALC 46 11/10/2012 0512   CREATININE 1.30 (H) 07/02/2018 0934   CREATININE 1.10 11/10/2012 0512    Wt Readings from Last 3 Encounters:  01/07/19 130 lb (59 kg)  07/01/18 131 lb (59.4 kg)  01/02/18 124 lb (56.2 kg)    ------------------------------------------------------------------------  Follow up for Vitamin D Deficiency:   The patient was last seen for this 6 months ago. Changes made at last visit include starting OTC Vitamin D3 2000 units daily.  She reports good compliance with treatment. She feels that condition is Unchanged. She is not having side effects.   ------------------------------------------------------------------------------------  Follow up for Hypothyroidism:  The patient was last seen for this 6 months ago. Changes made at last visit include none.  She reports good compliance with  treatment. She feels that condition is Unchanged. She is not having side effects.   ------------------------------------------------------------------------------------  Hypertension, follow-up:  BP Readings from Last 3 Encounters:  01/07/19 (!) 146/50  07/01/18 (!) 140/56  01/02/18 140/60    She was last seen for hypertension 6 months ago.  BP at that visit was 140/56. Management since that visit includes no changes. She reports good compliance with treatment. She is not having side effects.  She is not exercising. She is not adherent to low salt diet.   Outside blood pressures are 160-180/75-85. She is experiencing none.  Patient denies chest pain, chest pressure/discomfort, claudication, dyspnea, exertional chest pressure/discomfort, fatigue, irregular heart beat, lower extremity edema, near-syncope, orthopnea, palpitations, paroxysmal nocturnal dyspnea, syncope and tachypnea.   Cardiovascular risk factors include advanced age (older than 87 for men, 40 for women), diabetes mellitus and hypertension.  Use of agents associated with hypertension: NSAIDS.     Weight trend: stable Wt Readings from Last 3 Encounters:  01/07/19 130 lb (59 kg)  07/01/18 131 lb (59.4 kg)  01/02/18 124 lb (56.2 kg)    Current diet: regular diet  ------------------------------------------------------------------------  Follow up COPD She states she has been having more difficulty breathing when walking lately. Activity is limited by back pain and shortness of breath with exertion. She is using Spiriva consistently every day.  Allergies  Allergen Reactions  . Alendronate Nausea And Vomiting    Other reaction(s): Vomiting     Current Outpatient Medications:  .  amLODipine (NORVASC) 5  MG tablet, TAKE ONE (1) TABLET EACH DAY, Disp: 90 tablet, Rfl: 4 .  aspirin EC 81 MG tablet, Take 81 mg by mouth daily. , Disp: , Rfl:  .  Blood Glucose Monitoring Suppl (ONE TOUCH ULTRA 2) w/Device KIT, Use to  check blood sugar daily, Disp: 1 each, Rfl: 0 .  Cholecalciferol (VITAMIN D3) 50 MCG (2000 UT) TABS, Take 1 tablet by mouth daily., Disp: , Rfl:  .  dorzolamide (TRUSOPT) 2 % ophthalmic solution, Place 1 drop into both eyes 2 (two) times daily. , Disp: , Rfl:  .  glipiZIDE-metformin (METAGLIP) 2.5-500 MG tablet, TAKE ONE TABLET EVERY MORNING, Disp: 90 tablet, Rfl: 4 .  ibandronate (BONIVA) 150 MG tablet, TAKE ONE TABLET ONCE A MONTH FIRST THING IN THE MORNING AT LEAST 1 HOUR BEFORE EATING, TAKE WITH WATER., Disp: 3 tablet, Rfl: 3 .  Ibuprofen-diphenhydrAMINE Cit (ADVIL PM) 200-38 MG TABS, Take 1 tablet by mouth at bedtime as needed., Disp: , Rfl:  .  Lancets (ONETOUCH ULTRASOFT) lancets, Use as instructed to check blood sugar once a day, Disp: 100 each, Rfl: 3 .  latanoprost (XALATAN) 0.005 % ophthalmic solution, Place 1 drop into both eyes at bedtime. , Disp: , Rfl:  .  levothyroxine (SYNTHROID, LEVOTHROID) 100 MCG tablet, TAKE ONE (1) TABLET BY MOUTH EVERY DAY, Disp: 90 tablet, Rfl: 3 .  loperamide (IMODIUM) 2 MG capsule, Take 2 mg by mouth as needed for diarrhea or loose stools., Disp: , Rfl:  .  lovastatin (MEVACOR) 40 MG tablet, TAKE 1 TABLET BY MOUTH AT BEDTIME, Disp: 90 tablet, Rfl: 4 .  montelukast (SINGULAIR) 10 MG tablet, Take 1 tablet (10 mg total) by mouth daily. For chronic cough, Disp: 30 tablet, Rfl: 2 .  naproxen (NAPROSYN) 375 MG tablet, Take 1 tablet (375 mg total) by mouth daily as needed for mild pain., Disp: 90 tablet, Rfl: 2 .  nortriptyline (PAMELOR) 25 MG capsule, Take 1 capsule (25 mg total) by mouth at bedtime., Disp: 90 capsule, Rfl: 3 .  omeprazole (PRILOSEC) 40 MG capsule, Take 1 capsule (40 mg total) by mouth daily., Disp: 90 capsule, Rfl: 4 .  ONE TOUCH ULTRA TEST test strip, TEST SUGAR TWICE DAILY, Disp: 100 each, Rfl: 4 .  Polyethylene Glycol 3350 (DULCOLAX BALANCE PO), Take 2 tablets by mouth as needed., Disp: , Rfl:  .  Tiotropium Bromide Monohydrate (SPIRIVA  RESPIMAT) 2.5 MCG/ACT AERS, USE 2 PUFFS EVERY DAY, Disp: 3 Inhaler, Rfl: 4 .  traMADol (ULTRAM) 50 MG tablet, TAKE ONE TABLET EVERY 8 HOUR AS NEEDED FOR PAIN, Disp: 90 tablet, Rfl: 1 .  valsartan-hydrochlorothiazide (DIOVAN-HCT) 160-25 MG tablet, Take 1 tablet by mouth daily., Disp: 90 tablet, Rfl: 4  Review of Systems  Constitutional: Negative for appetite change, chills, fatigue and fever.  Respiratory: Negative for chest tightness and shortness of breath.   Cardiovascular: Negative for chest pain and palpitations.  Gastrointestinal: Negative for abdominal pain, nausea and vomiting.  Neurological: Positive for headaches (left temporal). Negative for dizziness and weakness.    Social History   Tobacco Use  . Smoking status: Former Smoker    Quit date: 07/18/1991    Years since quitting: 27.4  . Smokeless tobacco: Never Used  Substance Use Topics  . Alcohol use: No      Objective:   BP (!) 146/50 (BP Location: Right Arm)   Pulse 79   Temp 98.3 F (36.8 C) (Oral)   Resp 16   Wt 130 lb (59 kg)  SpO2 98% Comment: room air  BMI 23.78 kg/m  Vitals:   01/07/19 1100 01/07/19 1106  BP: (!) 142/52 (!) 146/50  Pulse: 79   Resp: 16   Temp: 98.3 F (36.8 C)   TempSrc: Oral   SpO2: 98%   Weight: 130 lb (59 kg)      Physical Exam   General Appearance:    Alert, cooperative, no distress  Eyes:    PERRL, conjunctiva/corneas clear, EOM's intact       Lungs:     Clear to auscultation bilaterally, respirations unlabored  Heart:    Regular rate and rhythm  Neurologic:   Awake, alert, oriented x 3. No apparent focal neurological           defect.          Assessment & Plan    1. Essential (primary) hypertension Well controlled.  Continue current medications.    2. Chronic obstructive pulmonary disease, unspecified COPD type ([place]) Compliant with Spiriva but still some shortness of breath when ambulating. Change to samples of - Fluticasone-Umeclidin-Vilant (TRELEGY ELLIPTA)  100-62.5-25 MCG/INH AEPB; Inhale 1 puff into the lungs daily. TAKE IN PLACE OF SPIRIVA  Dispense: 2 each; Refill: 0  3. Adult hypothyroidism -TSH  4. Hyperlipidemia, unspecified hyperlipidemia type She is tolerating simvastatin well with no adverse effects.   - Lipid panel - TSH - CBC - Comprehensive metabolic panel  5. Osteoporosis, unspecified osteoporosis type, unspecified pathological fracture presence  - VITAMIN D 25 Hydroxy (Vit-D Deficiency, Fractures)  6. Chronic kidney disease (CKD), stage III (moderate) ([facility]) -CMET checked today  7. Other diabetic neurological complication associated with type 2 diabetes mellitus ([place]) Doing well on current medications.  - Hemoglobin A1c       Lelon Huh, MD  [facility]

## 2019-01-07 NOTE — Patient Instructions (Signed)
.   Please review the attached list of medications and notify my office if there are any errors.   . Please bring all of your medications to every appointment so we can make sure that our medication list is the same as yours.   

## 2019-01-08 ENCOUNTER — Other Ambulatory Visit: Payer: Self-pay | Admitting: Family Medicine

## 2019-01-08 DIAGNOSIS — J449 Chronic obstructive pulmonary disease, unspecified: Secondary | ICD-10-CM

## 2019-01-08 LAB — COMPREHENSIVE METABOLIC PANEL
ALT: 9 IU/L (ref 0–32)
AST: 12 IU/L (ref 0–40)
Albumin/Globulin Ratio: 1.7 (ref 1.2–2.2)
Albumin: 4.5 g/dL (ref 3.6–4.6)
Alkaline Phosphatase: 62 IU/L (ref 39–117)
BUN/Creatinine Ratio: 22 (ref 12–28)
BUN: 28 mg/dL — ABNORMAL HIGH (ref 8–27)
Bilirubin Total: 0.3 mg/dL (ref 0.0–1.2)
CO2: 21 mmol/L (ref 20–29)
Calcium: 9.5 mg/dL (ref 8.7–10.3)
Chloride: 104 mmol/L (ref 96–106)
Creatinine, Ser: 1.28 mg/dL — ABNORMAL HIGH (ref 0.57–1.00)
GFR calc Af Amer: 43 mL/min/{1.73_m2} — ABNORMAL LOW (ref >59)
GFR calc non Af Amer: 38 mL/min/{1.73_m2} — ABNORMAL LOW (ref >59)
Globulin, Total: 2.7 g/dL (ref 1.5–4.5)
Glucose: 85 mg/dL (ref 65–99)
Potassium: 4.7 mmol/L (ref 3.5–5.2)
Sodium: 140 mmol/L (ref 134–144)
Total Protein: 7.2 g/dL (ref 6.0–8.5)

## 2019-01-08 LAB — HEMOGLOBIN A1C
Est. average glucose Bld gHb Est-mCnc: 111 mg/dL
Hgb A1c MFr Bld: 5.5 % (ref 4.8–5.6)

## 2019-01-08 LAB — CBC
Hematocrit: 30.2 % — ABNORMAL LOW (ref 34.0–46.6)
Hemoglobin: 9.7 g/dL — ABNORMAL LOW (ref 11.1–15.9)
MCH: 29.5 pg (ref 26.6–33.0)
MCHC: 32.1 g/dL (ref 31.5–35.7)
MCV: 92 fL (ref 79–97)
Platelets: 225 10*3/uL (ref 150–450)
RBC: 3.29 x10E6/uL — ABNORMAL LOW (ref 3.77–5.28)
RDW: 11.8 % (ref 11.7–15.4)
WBC: 5.9 10*3/uL (ref 3.4–10.8)

## 2019-01-08 LAB — LIPID PANEL
Chol/HDL Ratio: 2.8 ratio (ref 0.0–4.4)
Cholesterol, Total: 115 mg/dL (ref 100–199)
HDL: 41 mg/dL (ref >39)
LDL Calculated: 50 mg/dL (ref 0–99)
Triglycerides: 121 mg/dL (ref 0–149)
VLDL Cholesterol Cal: 24 mg/dL (ref 5–40)

## 2019-01-08 LAB — VITAMIN D 25 HYDROXY (VIT D DEFICIENCY, FRACTURES): Vit D, 25-Hydroxy: 42.8 ng/mL (ref 30.0–100.0)

## 2019-01-08 LAB — TSH: TSH: 1.61 u[IU]/mL (ref 0.450–4.500)

## 2019-01-08 NOTE — Telephone Encounter (Signed)
Pt needing a call back to discuss side possible effects of  Fluticasone-Umeclidin-Vilant (TRELEGY ELLIPTA) 100-62.5-25 MCG/INH AEPB.  Please call pt back.  Thanks, [employer]

## 2019-01-15 ENCOUNTER — Telehealth: Payer: Self-pay

## 2019-01-15 LAB — SPECIMEN STATUS REPORT

## 2019-01-15 LAB — B12 AND FOLATE PANEL
Folate: 12.5 ng/mL (ref >3.0)
Vitamin B-12: 286 pg/mL (ref 232–1245)

## 2019-01-15 LAB — FERRITIN: Ferritin: 119 ng/mL (ref 15–150)

## 2019-01-15 NOTE — Telephone Encounter (Signed)
-----   Message from Birdie Sons, MD sent at 01/15/2019 11:08 AM EDT ----- b12 level is borderline low, which is probably contributing to anemia. Need to take 1000mg  b12 daily. Rest of labs are good. Follow up 6 months.

## 2019-01-15 NOTE — Telephone Encounter (Signed)
Patient advised as below. Patient verbalizes understanding and is in agreement with treatment plan.  

## 2019-01-15 NOTE — Telephone Encounter (Signed)
Patient is calling office stating that she was seen by Dr. Caryn Section last week but has not been advised of her lab results. Patient is requesting a CMA give her a call back to advise. KW

## 2019-01-15 NOTE — Telephone Encounter (Signed)
Tried calling; no answer.   Thanks,   -Henretta Quist  

## 2019-01-22 ENCOUNTER — Telehealth: Payer: Self-pay | Admitting: Family Medicine

## 2019-01-22 NOTE — Telephone Encounter (Signed)
Patient states that she cant tell any difference in her breathing since using the Trelegy inhaler. Patient states her breathing is about the same as when she was using the Spiriva.

## 2019-01-22 NOTE — Telephone Encounter (Signed)
Patient was seen in June and given samples of Trelegy inhaler to help with her breathing, please check on her and see if she has noticed any improvement since starting medication, if so will get her prescription.

## 2019-02-06 ENCOUNTER — Telehealth: Payer: Self-pay | Admitting: Family Medicine

## 2019-02-06 DIAGNOSIS — E118 Type 2 diabetes mellitus with unspecified complications: Secondary | ICD-10-CM

## 2019-02-06 MED ORDER — ONETOUCH ULTRASOFT LANCETS MISC: 3 refills | Status: DC

## 2019-02-06 NOTE — Telephone Encounter (Signed)
Pt needs a refill on the One Touch lancets.  [employer]

## 2019-02-06 NOTE — Telephone Encounter (Signed)
Pt was given samples of Trelegy inhaler.  She is out.  Does Dr. Caryn Section want her to continue to get samples or go back on the Spriva inhaler.  She wants to know which one should she use.  She said she could not tell much of a difference in either one.  CB#  [phone]  teri

## 2019-02-06 NOTE — Telephone Encounter (Signed)
If her breathing has improved then she should get rxTrelegy . If not improved she may as well go back to Spiriva. If she is out can send in prescription with 1 years refills.

## 2019-02-07 MED ORDER — ONETOUCH ULTRASOFT LANCETS MISC: 3 refills | Status: DC

## 2019-02-07 NOTE — Telephone Encounter (Signed)
Patient advised as below. Patient states she couldnt tell any difference in using the Trelegy. Patient agrees to restart Spiriva. I called the pharmacy and they already had a prescription for Spiriva on file that included 3 refills. Patient advised of this. I also rent in a refill for her test strips.

## 2019-03-07 ENCOUNTER — Other Ambulatory Visit: Payer: Self-pay | Admitting: Family Medicine

## 2019-03-22 ENCOUNTER — Other Ambulatory Visit: Payer: Self-pay | Admitting: Family Medicine

## 2019-04-23 ENCOUNTER — Other Ambulatory Visit: Payer: Self-pay | Admitting: Family Medicine

## 2019-06-09 DIAGNOSIS — H401131 Primary open-angle glaucoma, bilateral, mild stage: Secondary | ICD-10-CM | POA: Diagnosis not present

## 2019-06-09 LAB — HM DIABETES EYE EXAM

## 2019-06-16 ENCOUNTER — Telehealth: Payer: Self-pay

## 2019-06-16 NOTE — Telephone Encounter (Signed)
Patient rescheduled

## 2019-06-16 NOTE — Telephone Encounter (Signed)
fCopied from [place] [id number]. Topic: General - Inquiry >> Jun 13, 2019  4:01 PM Mathis Bud wrote: Reason for CRM: Patient called regarding her appt on Tuesday 8am.  Patient states her appt with at all 11am.  Patient states she cannot come in at 8am, patient would like to know if she can come in later that day. Call back [phone]

## 2019-06-17 ENCOUNTER — Ambulatory Visit: Payer: Self-pay | Admitting: Family Medicine

## 2019-06-17 ENCOUNTER — Other Ambulatory Visit: Payer: Self-pay | Admitting: Family Medicine

## 2019-06-17 MED ORDER — ONETOUCH ULTRA VI STRP: ORAL_STRIP | 4 refills | Status: DC

## 2019-06-17 NOTE — Telephone Encounter (Signed)
Medication Refill - Medication: ONETOUCH ULTRA test strip   Pt stated she is down to one test strip due to testing her blood sugar 3-4 times a day instead of 2. Pt  Stated it had been running high   Has the patient contacted their pharmacy? Yes.   (Agent: If no, request that the patient contact the pharmacy for the refill.) (Agent: If yes, when and what did the pharmacy advise?)  Preferred Pharmacy (with phone number or street name):  [facility] [id number], [place] [phone] (Phone) [phone] (Fax)     Agent: Please be advised that RX refills may take up to 3 business days. We ask that you follow-up with your pharmacy.

## 2019-06-28 ENCOUNTER — Telehealth: Payer: Self-pay | Admitting: Family Medicine

## 2019-06-28 MED ORDER — CEPHALEXIN 500 MG PO CAPS: 500.0000 mg | ORAL_CAPSULE | Freq: Two times a day (BID) | ORAL | 0 refills | Status: AC

## 2019-06-28 NOTE — Telephone Encounter (Signed)
Received call from call-a-nurse that patient started experiencing typical UTI sx of frequency, urgency, dysuria and hematuria. Agreed to send in prescription for antibiotic and for patient to call office if not better during the week

## 2019-07-04 ENCOUNTER — Other Ambulatory Visit: Payer: Self-pay

## 2019-07-04 ENCOUNTER — Ambulatory Visit (INDEPENDENT_AMBULATORY_CARE_PROVIDER_SITE_OTHER): Payer: PPO | Admitting: Physician Assistant

## 2019-07-04 ENCOUNTER — Encounter: Payer: Self-pay | Admitting: Physician Assistant

## 2019-07-04 DIAGNOSIS — J440 Chronic obstructive pulmonary disease with acute lower respiratory infection: Secondary | ICD-10-CM

## 2019-07-04 DIAGNOSIS — J209 Acute bronchitis, unspecified: Secondary | ICD-10-CM | POA: Diagnosis not present

## 2019-07-04 MED ORDER — AZITHROMYCIN 250 MG PO TABS: ORAL_TABLET | ORAL | 0 refills | Status: DC

## 2019-07-04 NOTE — Progress Notes (Signed)
Patient: Robyn Butler Female    DOB: 1929-04-07   85 y.o.   MRN: [id number] Visit Date: 07/04/2019  Today's Provider: Mar Daring, PA-C   No chief complaint on file.  Subjective:    Virtual Visit via Telephone Note  I connected with Robyn Butler on 07/04/19 at  2:40 PM EST by telephone and verified that I am speaking with the correct person using two identifiers.  Location: Patient: Home Provider: [facility]   I discussed the limitations, risks, security and privacy concerns of performing an evaluation and management service by telephone and the availability of in person appointments. I also discussed with the patient that there may be a patient responsible charge related to this service. The patient expressed understanding and agreed to proceed.   URI  This is a new problem. The current episode started yesterday. The problem has been unchanged. There has been no fever. Associated symptoms include congestion, coughing and sneezing. Pertinent negatives include no chest pain, ear pain, headaches, plugged ear sensation, rhinorrhea, sore throat, vomiting or wheezing. She has tried nothing for the symptoms.      Allergies  Allergen Reactions  . Alendronate Nausea And Vomiting    Other reaction(s): Vomiting     Current Outpatient Medications:  .  amLODipine (NORVASC) 5 MG tablet, TAKE ONE(1) TABLET EACH DAY, Disp: 90 tablet, Rfl: 4 .  aspirin EC 81 MG tablet, Take 81 mg by mouth daily. , Disp: , Rfl:  .  Blood Glucose Monitoring Suppl (ONE TOUCH ULTRA 2) w/Device KIT, Use to check blood sugar daily, Disp: 1 each, Rfl: 0 .  Cholecalciferol (VITAMIN D3) 50 MCG (2000 UT) TABS, Take 1 tablet by mouth daily., Disp: , Rfl:  .  dorzolamide (TRUSOPT) 2 % ophthalmic solution, Place 1 drop into both eyes 2 (two) times daily. , Disp: , Rfl:  .  Fluticasone-Umeclidin-Vilant (TRELEGY ELLIPTA) 100-62.5-25 MCG/INH AEPB, Inhale 1 puff into the lungs daily. TAKE IN PLACE OF SPIRIVA,  Disp: 2 each, Rfl: 0 .  glipiZIDE-metformin (METAGLIP) 2.5-500 MG tablet, TAKE ONE TABLET EVERY MORNING, Disp: 90 tablet, Rfl: 4 .  glucose blood (ONETOUCH ULTRA) test strip, USE TO TEST BLOOD SUGAR TWICE DAILY, Disp: 100 strip, Rfl: 4 .  ibandronate (BONIVA) 150 MG tablet, TAKE ONE TABLET ONCE A MONTH FIRST THING IN THE MORNING AT LEAST 1 HOUR BEFORE EATING, TAKE WITH WATER., Disp: 1 tablet, Rfl: 12 .  Ibuprofen-diphenhydrAMINE Cit (ADVIL PM) 200-38 MG TABS, Take 1 tablet by mouth at bedtime as needed., Disp: , Rfl:  .  Lancets (ONETOUCH ULTRASOFT) lancets, Use as instructed to check blood sugar once a day, Disp: 100 each, Rfl: 3 .  latanoprost (XALATAN) 0.005 % ophthalmic solution, Place 1 drop into both eyes at bedtime. , Disp: , Rfl:  .  levothyroxine (SYNTHROID) 100 MCG tablet, TAKE 1 TABLET BY MOUTH EVERY DAY, Disp: 90 tablet, Rfl: 3 .  loperamide (IMODIUM) 2 MG capsule, Take 2 mg by mouth as needed for diarrhea or loose stools., Disp: , Rfl:  .  lovastatin (MEVACOR) 40 MG tablet, TAKE 1 TABLET BY MOUTH AT BEDTIME, Disp: 90 tablet, Rfl: 4 .  montelukast (SINGULAIR) 10 MG tablet, Take 1 tablet (10 mg total) by mouth daily. For chronic cough, Disp: 30 tablet, Rfl: 2 .  naproxen (NAPROSYN) 375 MG tablet, Take 1 tablet (375 mg total) by mouth daily as needed for mild pain., Disp: 90 tablet, Rfl: 2 .  nortriptyline (PAMELOR) 25 MG  capsule, Take 1 capsule (25 mg total) by mouth at bedtime., Disp: 90 capsule, Rfl: 3 .  omeprazole (PRILOSEC) 40 MG capsule, Take 1 capsule (40 mg total) by mouth daily., Disp: 90 capsule, Rfl: 4 .  Polyethylene Glycol 3350 (DULCOLAX BALANCE PO), Take 2 tablets by mouth as needed., Disp: , Rfl:  .  SPIRIVA RESPIMAT 2.5 MCG/ACT AERS, SMARTSIG:2 Puff(s) By Mouth Daily, Disp: , Rfl:  .  traMADol (ULTRAM) 50 MG tablet, TAKE ONE TABLET EVERY 8 HOUR AS NEEDED FOR PAIN, Disp: 90 tablet, Rfl: 1 .  valsartan-hydrochlorothiazide (DIOVAN-HCT) 160-25 MG tablet, Take 1 tablet by mouth  daily., Disp: 90 tablet, Rfl: 4 .  azithromycin (ZITHROMAX) 250 MG tablet, Take 2 tablets PO on day one, and one tablet PO daily thereafter until completed., Disp: 6 tablet, Rfl: 0  Review of Systems  Constitutional: Negative for chills, diaphoresis and fever.  HENT: Positive for congestion, sneezing and voice change. Negative for ear pain, postnasal drip, rhinorrhea, sore throat and trouble swallowing.   Respiratory: Positive for cough. Negative for chest tightness, shortness of breath and wheezing.   Cardiovascular: Negative for chest pain, palpitations and leg swelling.  Gastrointestinal: Negative for vomiting.  Neurological: Negative for dizziness, light-headedness and headaches.    Social History   Tobacco Use  . Smoking status: Former Smoker    Quit date: 07/18/1991    Years since quitting: 27.9  . Smokeless tobacco: Never Used  Substance Use Topics  . Alcohol use: No      Objective:   There were no vitals taken for this visit. There were no vitals filed for this visit.There is no height or weight on file to calculate BMI.   Physical Exam Vitals reviewed.  Constitutional:      General: She is not in acute distress. Pulmonary:     Effort: No respiratory distress.  Neurological:     Mental Status: She is alert.      No results found for any visits on 07/04/19.     Assessment & Plan     1. Acute bronchitis with COPD ([place]) High risk for pneumonia and complications of pneumonia. Will treat with Zpak as below. Continue inhalers for COPD as prescribed. Push fluids and get plenty of rest. Call if worsening. - azithromycin (ZITHROMAX) 250 MG tablet; Take 2 tablets PO on day one, and one tablet PO daily thereafter until completed.  Dispense: 6 tablet; Refill: 0   I discussed the assessment and treatment plan with the patient. The patient was provided an opportunity to ask questions and all were answered. The patient agreed with the plan and demonstrated an understanding  of the instructions.   The patient was advised to call back or seek an in-person evaluation if the symptoms worsen or if the condition fails to improve as anticipated.  I provided 10 minutes of non-face-to-face time during this encounter.    Mar Daring, PA-C  [facility]

## 2019-07-08 ENCOUNTER — Ambulatory Visit: Payer: PPO | Admitting: Family Medicine

## 2019-07-21 ENCOUNTER — Encounter: Payer: Self-pay | Admitting: Family Medicine

## 2019-07-21 NOTE — Progress Notes (Signed)
Patient: Robyn Butler Female    DOB: 12-06-1928   86 y.o.   MRN: [id number] Visit Date: 07/21/2019  Today's Provider: Lelon Huh, MD   Chief Complaint  Patient presents with  . Cough   Subjective:     Cough This is a recurrent problem. Episode onset: 1 month ago; was treated for Bronchitis 07/04/2019  The problem has been gradually improving. The cough is productive of sputum (white/ greeen colored mucus). Associated symptoms include shortness of breath (unchanged from baseline). Pertinent negatives include no chest pain, chills or fever. Treatments tried: Z Pak. The treatment provided moderate relief.    Allergies  Allergen Reactions  . Alendronate Nausea And Vomiting    Other reaction(s): Vomiting     Current Outpatient Medications:  .  amLODipine (NORVASC) 5 MG tablet, TAKE ONE(1) TABLET EACH DAY, Disp: 90 tablet, Rfl: 4 .  aspirin EC 81 MG tablet, Take 81 mg by mouth daily. , Disp: , Rfl:  .  Blood Glucose Monitoring Suppl (ONE TOUCH ULTRA 2) w/Device KIT, Use to check blood sugar daily, Disp: 1 each, Rfl: 0 .  Cholecalciferol (VITAMIN D3) 50 MCG (2000 UT) TABS, Take 1 tablet by mouth daily., Disp: , Rfl:  .  dorzolamide (TRUSOPT) 2 % ophthalmic solution, Place 1 drop into both eyes 2 (two) times daily. , Disp: , Rfl:  .  Fluticasone-Umeclidin-Vilant (TRELEGY ELLIPTA) 100-62.5-25 MCG/INH AEPB, Inhale 1 puff into the lungs daily. TAKE IN PLACE OF SPIRIVA, Disp: 2 each, Rfl: 0 .  glipiZIDE-metformin (METAGLIP) 2.5-500 MG tablet, TAKE ONE TABLET EVERY MORNING, Disp: 90 tablet, Rfl: 4 .  glucose blood (ONETOUCH ULTRA) test strip, USE TO TEST BLOOD SUGAR TWICE DAILY, Disp: 100 strip, Rfl: 4 .  ibandronate (BONIVA) 150 MG tablet, TAKE ONE TABLET ONCE A MONTH FIRST THING IN THE MORNING AT LEAST 1 HOUR BEFORE EATING, TAKE WITH WATER., Disp: 1 tablet, Rfl: 12 .  Ibuprofen-diphenhydrAMINE Cit (ADVIL PM) 200-38 MG TABS, Take 1 tablet by mouth at bedtime as needed., Disp: , Rfl:    .  Lancets (ONETOUCH ULTRASOFT) lancets, Use as instructed to check blood sugar once a day, Disp: 100 each, Rfl: 3 .  latanoprost (XALATAN) 0.005 % ophthalmic solution, Place 1 drop into both eyes at bedtime. , Disp: , Rfl:  .  levothyroxine (SYNTHROID) 100 MCG tablet, TAKE 1 TABLET BY MOUTH EVERY DAY, Disp: 90 tablet, Rfl: 3 .  loperamide (IMODIUM) 2 MG capsule, Take 2 mg by mouth as needed for diarrhea or loose stools., Disp: , Rfl:  .  lovastatin (MEVACOR) 40 MG tablet, TAKE 1 TABLET BY MOUTH AT BEDTIME, Disp: 90 tablet, Rfl: 4 .  montelukast (SINGULAIR) 10 MG tablet, Take 1 tablet (10 mg total) by mouth daily. For chronic cough, Disp: 30 tablet, Rfl: 2 .  naproxen (NAPROSYN) 375 MG tablet, Take 1 tablet (375 mg total) by mouth daily as needed for mild pain., Disp: 90 tablet, Rfl: 2 .  nortriptyline (PAMELOR) 25 MG capsule, Take 1 capsule (25 mg total) by mouth at bedtime., Disp: 90 capsule, Rfl: 3 .  omeprazole (PRILOSEC) 40 MG capsule, Take 1 capsule (40 mg total) by mouth daily., Disp: 90 capsule, Rfl: 4 .  Polyethylene Glycol 3350 (DULCOLAX BALANCE PO), Take 2 tablets by mouth as needed., Disp: , Rfl:  .  SPIRIVA RESPIMAT 2.5 MCG/ACT AERS, SMARTSIG:2 Puff(s) By Mouth Daily, Disp: , Rfl:  .  traMADol (ULTRAM) 50 MG tablet, TAKE ONE TABLET EVERY 8 HOUR  AS NEEDED FOR PAIN, Disp: 90 tablet, Rfl: 1 .  valsartan-hydrochlorothiazide (DIOVAN-HCT) 160-25 MG tablet, Take 1 tablet by mouth daily., Disp: 90 tablet, Rfl: 4  Review of Systems  Constitutional: Negative for appetite change, chills, fatigue and fever.  Respiratory: Positive for cough and shortness of breath (unchanged from baseline). Negative for chest tightness.   Cardiovascular: Negative for chest pain and palpitations.  Gastrointestinal: Negative for abdominal pain, nausea and vomiting.  Neurological: Negative for dizziness and weakness.    Social History   Tobacco Use  . Smoking status: Former Smoker    Quit date: 07/18/1991     Years since quitting: 28.0  . Smokeless tobacco: Never Used  Substance Use Topics  . Alcohol use: No      Objective:   There were no vitals taken for this visit. There were no vitals filed for this visit.There is no height or weight on file to calculate BMI.   Physical Exam  Awake, alert, oriented x 3. In no apparent distress. Occasional coughing during telephone interview.   No results found for any visits on 07/22/19.     Assessment & Plan    1. Acute bronchitis with COPD ([place]) Symptoms improved, but no resolved after Zpack prescribed last month.   - doxycycline (VIBRA-TABS) 100 MG tablet; Take 1 tablet (100 mg total) by mouth 2 (two) times daily for 7 days.  Dispense: 14 tablet; Refill: 0  2. Chronic obstructive pulmonary disease, unspecified COPD type ([facility]) Continue Spiriva, add- predniSONE (DELTASONE) 20 MG tablet; Take 1 tablet (20 mg total) by mouth daily with breakfast for 5 days.  Dispense: 5 tablet; Refill: 0  3. Cough  - benzonatate (TESSALON) 100 MG capsule; Take 1 capsule (100 mg total) by mouth 3 (three) times daily as needed for cough.  Dispense: 20 capsule; Refill: 0   The entirety of the information documented in the History of Present Illness, Review of Systems and Physical Exam were personally obtained by me. Portions of this information were initially documented by Meyer Cory, CMA and reviewed by me for thoroughness and accuracy.      Lelon Huh, MD  [facility]

## 2019-07-22 ENCOUNTER — Ambulatory Visit (INDEPENDENT_AMBULATORY_CARE_PROVIDER_SITE_OTHER): Payer: PPO | Admitting: Family Medicine

## 2019-07-22 DIAGNOSIS — J209 Acute bronchitis, unspecified: Secondary | ICD-10-CM

## 2019-07-22 DIAGNOSIS — R059 Cough, unspecified: Secondary | ICD-10-CM

## 2019-07-22 DIAGNOSIS — J449 Chronic obstructive pulmonary disease, unspecified: Secondary | ICD-10-CM | POA: Diagnosis not present

## 2019-07-22 DIAGNOSIS — R05 Cough: Secondary | ICD-10-CM

## 2019-07-22 DIAGNOSIS — J440 Chronic obstructive pulmonary disease with acute lower respiratory infection: Secondary | ICD-10-CM

## 2019-07-22 MED ORDER — PREDNISONE 20 MG PO TABS: 20.0000 mg | ORAL_TABLET | Freq: Every day | ORAL | 0 refills | Status: AC

## 2019-07-22 MED ORDER — BENZONATATE 100 MG PO CAPS: 100.0000 mg | ORAL_CAPSULE | Freq: Three times a day (TID) | ORAL | 0 refills | Status: AC | PRN

## 2019-07-22 MED ORDER — DOXYCYCLINE HYCLATE 100 MG PO TABS: 100.0000 mg | ORAL_TABLET | Freq: Two times a day (BID) | ORAL | 0 refills | Status: AC

## 2019-08-05 DIAGNOSIS — R319 Hematuria, unspecified: Secondary | ICD-10-CM | POA: Diagnosis not present

## 2019-08-05 DIAGNOSIS — N390 Urinary tract infection, site not specified: Secondary | ICD-10-CM | POA: Diagnosis not present

## 2019-08-13 ENCOUNTER — Ambulatory Visit: Payer: PPO

## 2019-08-16 ENCOUNTER — Other Ambulatory Visit: Payer: Self-pay | Admitting: Family Medicine

## 2019-08-21 ENCOUNTER — Ambulatory Visit: Payer: PPO | Attending: Internal Medicine

## 2019-08-21 DIAGNOSIS — I10 Essential (primary) hypertension: Secondary | ICD-10-CM | POA: Diagnosis not present

## 2019-08-21 DIAGNOSIS — J449 Chronic obstructive pulmonary disease, unspecified: Secondary | ICD-10-CM | POA: Diagnosis not present

## 2019-08-21 DIAGNOSIS — Z23 Encounter for immunization: Secondary | ICD-10-CM | POA: Insufficient documentation

## 2019-08-21 DIAGNOSIS — R55 Syncope and collapse: Secondary | ICD-10-CM | POA: Diagnosis not present

## 2019-08-21 DIAGNOSIS — Z0000 Encounter for general adult medical examination without abnormal findings: Secondary | ICD-10-CM | POA: Diagnosis not present

## 2019-08-21 DIAGNOSIS — E118 Type 2 diabetes mellitus with unspecified complications: Secondary | ICD-10-CM | POA: Diagnosis not present

## 2019-08-21 NOTE — Progress Notes (Signed)
   Covid-19 Vaccination Clinic  Name:  Robyn Butler    MRN: [id number] DOB: March 22, 1929  08/21/2019  [name] was observed post Covid-19 immunization for 15 minutes without incidence. She was provided with Vaccine Information Sheet and instruction to access the V-Safe system.   [name] was instructed to call 911 with any severe reactions post vaccine: Marland Kitchen Difficulty breathing  . Swelling of your face and throat  . A fast heartbeat  . A bad rash all over your body  . Dizziness and weakness    Immunizations Administered    Name Date Dose VIS Date Route   Pfizer COVID-19 Vaccine 08/21/2019  8:37 AM 0.3 mL 06/27/2019 Intramuscular   Manufacturer: [place]   Lot: [id number]   [place]: [id number]

## 2019-08-27 DIAGNOSIS — R55 Syncope and collapse: Secondary | ICD-10-CM | POA: Diagnosis not present

## 2019-08-28 ENCOUNTER — Other Ambulatory Visit: Payer: Self-pay | Admitting: Family Medicine

## 2019-08-28 NOTE — Telephone Encounter (Signed)
Requested medication (s) are due for refill today: Yes  Requested medication (s) are on the active medication list: Yes  Last refill:  08/25/18  Future visit scheduled: No  Notes to clinic:  Prescription has expired.    Requested Prescriptions  Pending Prescriptions Disp Refills   lovastatin (MEVACOR) 40 MG tablet [Pharmacy Med Name: Lovastatin 40 MG Oral Tablet] 90 tablet 0    Sig: TAKE 1 TABLET BY MOUTH AT BEDTIME      Cardiovascular:  Antilipid - Statins Passed - 08/28/2019 10:57 AM      Passed - Total Cholesterol in normal range and within 360 days    Cholesterol, Total  Date Value Ref Range Status  01/07/2019 115 100 - 199 mg/dL Final   Cholesterol  Date Value Ref Range Status  11/10/2012 96 0 - 200 mg/dL Final          Passed - LDL in normal range and within 360 days    Ldl Cholesterol, Calc  Date Value Ref Range Status  11/10/2012 46 0 - 100 mg/dL Final   LDL Calculated  Date Value Ref Range Status  01/07/2019 50 0 - 99 mg/dL Final          Passed - HDL in normal range and within 360 days    HDL Cholesterol  Date Value Ref Range Status  11/10/2012 36 (L) 40 - 60 mg/dL Final   HDL  Date Value Ref Range Status  01/07/2019 41 >39 mg/dL Final          Passed - Triglycerides in normal range and within 360 days    Triglycerides  Date Value Ref Range Status  01/07/2019 121 0 - 149 mg/dL Final  11/10/2012 68 0 - 200 mg/dL Final          Passed - Patient is not pregnant      Passed - Valid encounter within last 12 months    Recent Outpatient Visits           1 month ago Acute bronchitis with COPD [facility])   [facility] Birdie Sons, MD   1 month ago Acute bronchitis with COPD [facility])   [place], [place], PA-C   7 months ago Essential (primary) hypertension   [facility] Birdie Sons, MD   1 year ago Controlled type 2 diabetes mellitus with complication, without long-term current use of  insulin [facility])   [facility] Birdie Sons, MD   1 year ago Controlled type 2 diabetes mellitus with complication, without long-term current use of insulin [facility])   [facility] Caryn Section, Kirstie Peri, MD

## 2019-09-01 DIAGNOSIS — E118 Type 2 diabetes mellitus with unspecified complications: Secondary | ICD-10-CM | POA: Diagnosis not present

## 2019-09-01 DIAGNOSIS — I6523 Occlusion and stenosis of bilateral carotid arteries: Secondary | ICD-10-CM | POA: Diagnosis not present

## 2019-09-01 DIAGNOSIS — R55 Syncope and collapse: Secondary | ICD-10-CM | POA: Diagnosis not present

## 2019-09-01 DIAGNOSIS — I10 Essential (primary) hypertension: Secondary | ICD-10-CM | POA: Diagnosis not present

## 2019-09-09 ENCOUNTER — Other Ambulatory Visit: Payer: Self-pay | Admitting: Internal Medicine

## 2019-09-09 DIAGNOSIS — I779 Disorder of arteries and arterioles, unspecified: Secondary | ICD-10-CM

## 2019-09-15 ENCOUNTER — Ambulatory Visit: Payer: PPO | Attending: Internal Medicine

## 2019-09-15 DIAGNOSIS — Z23 Encounter for immunization: Secondary | ICD-10-CM | POA: Insufficient documentation

## 2019-09-15 NOTE — Progress Notes (Signed)
   Covid-19 Vaccination Clinic  Name:  Robyn Butler    MRN: [id number] DOB: 05/24/1929  09/15/2019  [name] was observed post Covid-19 immunization for 15 minutes without incidence. She was provided with Vaccine Information Sheet and instruction to access the V-Safe system.   [name] was instructed to call 911 with any severe reactions post vaccine: Marland Kitchen Difficulty breathing  . Swelling of your face and throat  . A fast heartbeat  . A bad rash all over your body  . Dizziness and weakness    Immunizations Administered    Name Date Dose VIS Date Route   Pfizer COVID-19 Vaccine 09/15/2019 12:04 PM 0.3 mL 06/27/2019 Intramuscular   Manufacturer: [place]   Lot: [id number]   [place]: [id number]

## 2019-09-18 DIAGNOSIS — D649 Anemia, unspecified: Secondary | ICD-10-CM | POA: Diagnosis not present

## 2019-09-18 DIAGNOSIS — E1122 Type 2 diabetes mellitus with diabetic chronic kidney disease: Secondary | ICD-10-CM | POA: Diagnosis not present

## 2019-09-18 DIAGNOSIS — Z79899 Other long term (current) drug therapy: Secondary | ICD-10-CM | POA: Diagnosis not present

## 2019-09-18 DIAGNOSIS — N183 Chronic kidney disease, stage 3 unspecified: Secondary | ICD-10-CM | POA: Diagnosis not present

## 2019-09-23 ENCOUNTER — Other Ambulatory Visit: Payer: Self-pay

## 2019-09-23 ENCOUNTER — Ambulatory Visit
Admission: RE | Admit: 2019-09-23 | Discharge: 2019-09-23 | Disposition: A | Discharge status: Home / Self Care | Payer: PPO | Source: Ambulatory Visit | Attending: Internal Medicine | Admitting: Internal Medicine

## 2019-09-23 DIAGNOSIS — I779 Disorder of arteries and arterioles, unspecified: Secondary | ICD-10-CM | POA: Insufficient documentation

## 2019-09-23 DIAGNOSIS — I6523 Occlusion and stenosis of bilateral carotid arteries: Secondary | ICD-10-CM | POA: Diagnosis not present

## 2019-09-23 MED ORDER — IOHEXOL 350 MG/ML SOLN
60.0000 mL | Freq: Once | INTRAVENOUS | Status: AC | PRN
  Administered 2019-09-23: 60 mL via INTRAVENOUS

## 2019-09-26 ENCOUNTER — Ambulatory Visit
Admission: RE | Admit: 2019-09-26 | Discharge: 2019-09-26 | Disposition: A | Discharge status: Home / Self Care | Payer: PPO | Source: Ambulatory Visit | Attending: Internal Medicine | Admitting: Internal Medicine

## 2019-09-26 ENCOUNTER — Inpatient Hospital Stay: Payer: PPO

## 2019-09-26 ENCOUNTER — Encounter: Payer: Self-pay | Admitting: Internal Medicine

## 2019-09-26 ENCOUNTER — Inpatient Hospital Stay: Payer: PPO | Attending: Internal Medicine | Admitting: Internal Medicine

## 2019-09-26 ENCOUNTER — Other Ambulatory Visit: Payer: Self-pay

## 2019-09-26 VITALS — BP 138/65 | HR 98 | Wt 122.0 lb

## 2019-09-26 DIAGNOSIS — N183 Chronic kidney disease, stage 3 unspecified: Secondary | ICD-10-CM | POA: Insufficient documentation

## 2019-09-26 DIAGNOSIS — Z833 Family history of diabetes mellitus: Secondary | ICD-10-CM | POA: Diagnosis not present

## 2019-09-26 DIAGNOSIS — I10 Essential (primary) hypertension: Secondary | ICD-10-CM | POA: Insufficient documentation

## 2019-09-26 DIAGNOSIS — R0600 Dyspnea, unspecified: Secondary | ICD-10-CM | POA: Insufficient documentation

## 2019-09-26 DIAGNOSIS — R5383 Other fatigue: Secondary | ICD-10-CM | POA: Insufficient documentation

## 2019-09-26 DIAGNOSIS — Z79899 Other long term (current) drug therapy: Secondary | ICD-10-CM | POA: Diagnosis not present

## 2019-09-26 DIAGNOSIS — Z791 Long term (current) use of non-steroidal anti-inflammatories (NSAID): Secondary | ICD-10-CM | POA: Diagnosis not present

## 2019-09-26 DIAGNOSIS — D631 Anemia in chronic kidney disease: Secondary | ICD-10-CM | POA: Diagnosis not present

## 2019-09-26 DIAGNOSIS — Z8249 Family history of ischemic heart disease and other diseases of the circulatory system: Secondary | ICD-10-CM | POA: Insufficient documentation

## 2019-09-26 DIAGNOSIS — Z87891 Personal history of nicotine dependence: Secondary | ICD-10-CM | POA: Diagnosis not present

## 2019-09-26 DIAGNOSIS — Z7982 Long term (current) use of aspirin: Secondary | ICD-10-CM | POA: Insufficient documentation

## 2019-09-26 DIAGNOSIS — N1832 Chronic kidney disease, stage 3b: Secondary | ICD-10-CM | POA: Insufficient documentation

## 2019-09-26 DIAGNOSIS — E611 Iron deficiency: Secondary | ICD-10-CM | POA: Diagnosis not present

## 2019-09-26 DIAGNOSIS — R0609 Other forms of dyspnea: Secondary | ICD-10-CM

## 2019-09-26 DIAGNOSIS — J449 Chronic obstructive pulmonary disease, unspecified: Secondary | ICD-10-CM | POA: Diagnosis not present

## 2019-09-26 DIAGNOSIS — Z7984 Long term (current) use of oral hypoglycemic drugs: Secondary | ICD-10-CM | POA: Insufficient documentation

## 2019-09-26 DIAGNOSIS — E119 Type 2 diabetes mellitus without complications: Secondary | ICD-10-CM | POA: Diagnosis not present

## 2019-09-26 DIAGNOSIS — R05 Cough: Secondary | ICD-10-CM | POA: Diagnosis not present

## 2019-09-26 LAB — LACTATE DEHYDROGENASE: LDH: 147 U/L (ref 98–192)

## 2019-09-26 LAB — CBC WITH DIFFERENTIAL/PLATELET
Abs Immature Granulocytes: 0.02 10*3/uL (ref 0.00–0.07)
Basophils Absolute: 0 10*3/uL (ref 0.0–0.1)
Basophils Relative: 0 %
Eosinophils Absolute: 0.1 10*3/uL (ref 0.0–0.5)
Eosinophils Relative: 2 %
HCT: 29.7 % — ABNORMAL LOW (ref 36.0–46.0)
Hemoglobin: 8.9 g/dL — ABNORMAL LOW (ref 12.0–15.0)
Immature Granulocytes: 0 %
Lymphocytes Relative: 29 %
Lymphs Abs: 1.8 10*3/uL (ref 0.7–4.0)
MCH: 28.9 pg (ref 26.0–34.0)
MCHC: 30 g/dL (ref 30.0–36.0)
MCV: 96.4 fL (ref 80.0–100.0)
Monocytes Absolute: 0.5 10*3/uL (ref 0.1–1.0)
Monocytes Relative: 9 %
Neutro Abs: 3.8 10*3/uL (ref 1.7–7.7)
Neutrophils Relative %: 60 %
Platelets: 268 10*3/uL (ref 150–400)
RBC: 3.08 MIL/uL — ABNORMAL LOW (ref 3.87–5.11)
RDW: 13.9 % (ref 11.5–15.5)
WBC: 6.3 10*3/uL (ref 4.0–10.5)
nRBC: 0 % (ref 0.0–0.2)

## 2019-09-26 LAB — COMPREHENSIVE METABOLIC PANEL
ALT: 10 U/L (ref 0–44)
AST: 17 U/L (ref 15–41)
Albumin: 4.1 g/dL (ref 3.5–5.0)
Alkaline Phosphatase: 64 U/L (ref 38–126)
Anion gap: 11 (ref 5–15)
BUN: 28 mg/dL — ABNORMAL HIGH (ref 8–23)
CO2: 23 mmol/L (ref 22–32)
Calcium: 8.7 mg/dL — ABNORMAL LOW (ref 8.9–10.3)
Chloride: 107 mmol/L (ref 98–111)
Creatinine, Ser: 1.34 mg/dL — ABNORMAL HIGH (ref 0.44–1.00)
GFR calc Af Amer: 41 mL/min — ABNORMAL LOW (ref >60)
GFR calc non Af Amer: 35 mL/min — ABNORMAL LOW (ref >60)
Glucose, Bld: 102 mg/dL — ABNORMAL HIGH (ref 70–99)
Potassium: 4.4 mmol/L (ref 3.5–5.1)
Sodium: 141 mmol/L (ref 135–145)
Total Bilirubin: 0.6 mg/dL (ref 0.3–1.2)
Total Protein: 7.7 g/dL (ref 6.5–8.1)

## 2019-09-26 LAB — IRON AND TIBC
Iron: 58 ug/dL (ref 28–170)
Saturation Ratios: 20 % (ref 10.4–31.8)
TIBC: 291 ug/dL (ref 250–450)
UIBC: 233 ug/dL

## 2019-09-26 LAB — RETICULOCYTES
Immature Retic Fract: 19.8 % — ABNORMAL HIGH (ref 2.3–15.9)
RBC.: 3.15 MIL/uL — ABNORMAL LOW (ref 3.87–5.11)
Retic Count, Absolute: 102.1 10*3/uL (ref 19.0–186.0)
Retic Ct Pct: 3.2 % — ABNORMAL HIGH (ref 0.4–3.1)

## 2019-09-26 LAB — C-REACTIVE PROTEIN: CRP: 0.6 mg/dL (ref <1.0)

## 2019-09-26 LAB — FERRITIN: Ferritin: 67 ng/mL (ref 11–307)

## 2019-09-26 LAB — VITAMIN B12: Vitamin B-12: 935 pg/mL — ABNORMAL HIGH (ref 180–914)

## 2019-09-26 LAB — FOLATE: Folate: 8.3 ng/mL (ref >5.9)

## 2019-09-26 NOTE — Assessment & Plan Note (Addendum)
#  Anemia-secondary chronic kidney disease stage III/iron deficiency [hb-8-9].  Patient symptomatic with increasing fatigue shortness of breath on exertion.  Discussed etiology of anemia likely from chronic kidney disease.  Discussed that patient will need IV iron; also need erythropoietin injections if hemoglobin does not improve even after iron supplementation.  Discussed the potential acute infusion reactions with IV iron; which are quite rare.  Patient understands the risk; will proceed with infusions.  #Chronic shortness of breath/history of COPD-worsening check chest x-ray.  #Recommend checking CBC CMP C-reactive protein chest x-ray iron studies haptoglobin LDH reticulocyte count [id number] folic acid for further work-up.  Myeloma panel next visit.  DISPOSITION:  # labs today # CXR today  # Follow up in 2 weeks- MD; IV venofer- Dr.B

## 2019-09-26 NOTE — Progress Notes (Signed)
[facility] NOTE  Patient Care Team: Kirk Ruths, MD as PCP - General (Internal Medicine) Estill Cotta, MD as Consulting Physician (Ophthalmology)  CHIEF COMPLAINTS/PURPOSE OF CONSULTATION:    [name] CKD-III Robyn Butler 2021- Hb 8-9;[name];  Ferritin-100; PCP] [EGD/; colonoscopy-[facility];2016 ; capsule-]  # CKD-III [GFR40s]  # COPD   HISTORY OF PRESENTING ILLNESS:  Robyn Butler 86 y.o.  female has been referred to [place] for further evaluation/work-up for anemia.  Reviewed the records at length-office note/labs from referral providers office-- summarized above.   Patient admits to worsening fatigue over the last many months.  Complains of shortness of breath with exertion.  Complains of mild cough.  No hemoptysis.   Blood in stools: None Change in bowel habits- None Blood in urine: None Difficulty swallowing: None Abnormal weight loss: None Iron supplementation: Prior Blood transfusions:   Vaginal bleeding: None   Review of Systems  Constitutional: Positive for malaise/fatigue. Negative for chills, diaphoresis, fever and weight loss.  HENT: Negative for nosebleeds and sore throat.   Eyes: Negative for double vision.  Respiratory: Positive for cough and shortness of breath. Negative for hemoptysis, sputum production and wheezing.   Cardiovascular: Negative for chest pain, palpitations, orthopnea and leg swelling.  Gastrointestinal: Negative for abdominal pain, blood in stool, constipation, diarrhea, heartburn, melena, nausea and vomiting.  Genitourinary: Negative for dysuria, frequency and urgency.  Musculoskeletal: Positive for back pain and joint pain.  Skin: Negative.  Negative for itching and rash.  Neurological: Negative for dizziness, tingling, focal weakness, weakness and headaches.  Endo/Heme/Allergies: Does not bruise/bleed easily.  Psychiatric/Behavioral: Negative for depression. The patient is not  nervous/anxious and does not have insomnia.     MEDICAL HISTORY:  Past Medical History:  Diagnosis Date  . Arthritis   . Cancer ([place])   . Chronic kidney disease   . COPD (chronic obstructive pulmonary disease) ([place])   . Diabetes mellitus without complication ([place])    type 2  . Hypertension   . Varicose veins of lower extremities with other complications     SURGICAL HISTORY: Past Surgical History:  Procedure Laterality Date  . ABDOMINAL HYSTERECTOMY  1972  . BLADDER SUSPENSION    . BREAST EXCISIONAL BIOPSY Right 1999   neg  . CHOLECYSTECTOMY  1975  . COLONOSCOPY    . COLONOSCOPY WITH PROPOFOL N/A 05/24/2015   Procedure: COLONOSCOPY WITH PROPOFOL;  Surgeon: Hulen Luster, MD;  Location: [facility] ENDOSCOPY;  Service: Gastroenterology;  Laterality: N/A;  . [place]  . ESOPHAGOGASTRODUODENOSCOPY N/A 05/24/2015   Procedure: ESOPHAGOGASTRODUODENOSCOPY (EGD);  Surgeon: Hulen Luster, MD;  Location: [facility] ENDOSCOPY;  Service: Gastroenterology;  Laterality: N/A;  . FRACTURE SURGERY    . JOINT REPLACEMENT Right 2013   hip  . salpingo oophorectmy     . THYROIDECTOMY  1969  . WRIST FRACTURE SURGERY Left     SOCIAL HISTORY: Social History   Socioeconomic History  . Marital status: Widowed    Spouse name: Not on file  . Number of children: 2  . Years of education: 9th Grade  . Highest education level: 9th grade  Occupational History  . Occupation: Retired  Tobacco Use  . Smoking status: Former Smoker    Quit date: 07/18/1991    Years since quitting: 28.2  . Smokeless tobacco: Never Used  Substance and Sexual Activity  . Alcohol use: No  . Drug use: No  . Sexual activity: Not on file  Other Topics Concern  .  Not on file  Social History Narrative   In [place] in senior place; quit smoking [> 25 years ago]; no alcohol; hosiery.    Social Determinants of Health   Financial Resource Strain:   . Difficulty of Paying Living Expenses:   Food Insecurity:   . Worried About  Charity fundraiser in the Last Year:   . Arboriculturist in the Last Year:   Transportation Needs:   . Film/video editor (Medical):   Marland Kitchen Lack of Transportation (Non-Medical):   Physical Activity: Inactive  . Days of Exercise per Week: 0 days  . Minutes of Exercise per Session: 0 min  Stress:   . Feeling of Stress :   Social Connections: Unknown  . Frequency of Communication with Friends and Family: Patient refused  . Frequency of Social Gatherings with Friends and Family: Patient refused  . Attends Religious Services: Patient refused  . Active Member of Clubs or Organizations: Patient refused  . Attends Archivist Meetings: Patient refused  . Marital Status: Patient refused  Intimate Partner Violence: Unknown  . Fear of Current or Ex-Partner: Patient refused  . Emotionally Abused: Patient refused  . Physically Abused: Patient refused  . Sexually Abused: Patient refused    FAMILY HISTORY: Family History  Problem Relation Age of Onset  . Heart attack Father   . Asthma Father   . Kidney disease Mother   . Hypertension Mother   . Hypertension Other   . Diabetes Other   . Heart attack Other   . Breast cancer Neg Hx     ALLERGIES:  is allergic to alendronate.  MEDICATIONS:  Current Outpatient Medications  Medication Sig Dispense Refill  . amLODipine (NORVASC) 5 MG tablet TAKE ONE(1) TABLET EACH DAY 90 tablet 4  . aspirin EC 81 MG tablet Take 81 mg by mouth daily.     . Blood Glucose Monitoring Suppl (ONE TOUCH ULTRA 2) w/Device KIT Use to check blood sugar daily 1 each 0  . Cholecalciferol (VITAMIN D3) 50 MCG (2000 UT) TABS Take 1 tablet by mouth daily.    . dorzolamide (TRUSOPT) 2 % ophthalmic solution Place 1 drop into both eyes 2 (two) times daily.     Marland Kitchen glipiZIDE-metformin (METAGLIP) 2.5-500 MG tablet TAKE ONE TABLET EVERY MORNING 90 tablet 4  . glucose blood (ONETOUCH ULTRA) test strip USE TO TEST BLOOD SUGAR TWICE DAILY 100 strip 4  . ibandronate  (BONIVA) 150 MG tablet TAKE ONE TABLET ONCE A MONTH FIRST THING IN THE MORNING AT LEAST 1 HOUR BEFORE EATING, TAKE WITH WATER. 1 tablet 12  . Ibuprofen-diphenhydrAMINE Cit (ADVIL PM) 200-38 MG TABS Take 1 tablet by mouth at bedtime as needed.    . Lancets (ONETOUCH ULTRASOFT) lancets Use as instructed to check blood sugar once a day 100 each 3  . latanoprost (XALATAN) 0.005 % ophthalmic solution Place 1 drop into both eyes at bedtime.     Marland Kitchen levothyroxine (SYNTHROID) 100 MCG tablet TAKE 1 TABLET BY MOUTH EVERY DAY 90 tablet 3  . loperamide (IMODIUM) 2 MG capsule Take 2 mg by mouth as needed for diarrhea or loose stools.    . lovastatin (MEVACOR) 40 MG tablet TAKE 1 TABLET BY MOUTH AT BEDTIME 90 tablet 0  . naproxen (NAPROSYN) 375 MG tablet Take 1 tablet (375 mg total) by mouth daily as needed for mild pain. 90 tablet 2  . nortriptyline (PAMELOR) 25 MG capsule TAKE 1 CAPSULE(25 MG) BY MOUTH AT BEDTIME 90  capsule 0  . omeprazole (PRILOSEC) 40 MG capsule Take 1 capsule (40 mg total) by mouth daily. 90 capsule 4  . Polyethylene Glycol 3350 (DULCOLAX BALANCE PO) Take 2 tablets by mouth as needed.    Marland Kitchen SPIRIVA RESPIMAT 2.5 MCG/ACT AERS SMARTSIG:2 Puff(s) By Mouth Daily    . traMADol (ULTRAM) 50 MG tablet TAKE ONE TABLET EVERY 8 HOUR AS NEEDED FOR PAIN 90 tablet 1  . valsartan-hydrochlorothiazide (DIOVAN-HCT) 160-25 MG tablet Take 1 tablet by mouth daily. 90 tablet 4  . Fluticasone-Umeclidin-Vilant (TRELEGY ELLIPTA) 100-62.5-25 MCG/INH AEPB Inhale 1 puff into the lungs daily. TAKE IN PLACE OF SPIRIVA (Patient not taking: Reported on 07/22/2019) 2 each 0  . montelukast (SINGULAIR) 10 MG tablet Take 1 tablet (10 mg total) by mouth daily. For chronic cough (Patient not taking: Reported on 09/26/2019) 30 tablet 2   No current facility-administered medications for this visit.      PHYSICAL EXAMINATION:   Vitals:   09/26/19 1116  BP: 138/65  Pulse: 98   Filed Weights   09/26/19 1116  Weight: 122 lb  (55.3 kg)    Physical Exam  Constitutional: She is oriented to person, place, and time and well-developed, well-nourished, and in no distress.  Patient is accompanied by daughter.  HENT:  Head: Normocephalic and atraumatic.  Mouth/Throat: Oropharynx is clear and moist. No oropharyngeal exudate.  Eyes: Pupils are equal, round, and reactive to light.  Cardiovascular: Normal rate and regular rhythm.  Pulmonary/Chest: No respiratory distress. She has no wheezes.  Decreased air entry bilateral lower lung bases.  Abdominal: Soft. Bowel sounds are normal. She exhibits no distension and no mass. There is no abdominal tenderness. There is no rebound and no guarding.  Musculoskeletal:        General: No tenderness or edema. Normal range of motion.     Cervical back: Normal range of motion and neck supple.  Neurological: She is alert and oriented to person, place, and time.  Skin: Skin is warm.  Psychiatric: Affect normal.    LABORATORY DATA:  I have reviewed the data as listed Lab Results  Component Value Date   WBC 6.3 09/26/2019   HGB 8.9 (L) 09/26/2019   HCT 29.7 (L) 09/26/2019   MCV 96.4 09/26/2019   PLT 268 09/26/2019   Recent Labs    01/07/19 1144 09/26/19 1204  NA 140 141  K 4.7 4.4  CL 104 107  CO2 21 23  GLUCOSE 85 102*  BUN 28* 28*  CREATININE 1.28* 1.34*  CALCIUM 9.5 8.7*  GFRNONAA 38* 35*  GFRAA 43* 41*  PROT 7.2 7.7  ALBUMIN 4.5 4.1  AST 12 17  ALT 9 10  ALKPHOS 62 64  BILITOT 0.3 0.6     CT ANGIO HEAD W OR WO CONTRAST  Result Date: 09/23/2019 CLINICAL DATA:  Dizziness and syncope. Carotid stenosis. EXAM: CT ANGIOGRAPHY HEAD AND NECK TECHNIQUE: Multidetector CT imaging of the head and neck was performed using the standard protocol during bolus administration of intravenous contrast. Multiplanar CT image reconstructions and MIPs were obtained to evaluate the vascular anatomy. Carotid stenosis measurements (when applicable) are obtained utilizing NASCET  criteria, using the distal internal carotid diameter as the denominator. CONTRAST:  75m OMNIPAQUE IOHEXOL 350 MG/ML SOLN COMPARISON:  None. FINDINGS: CT HEAD FINDINGS Brain: No evidence of acute infarction, hemorrhage, hydrocephalus, extra-axial collection or mass lesion/mass effect. Vascular: Calcified plaques are seen in the bilateral carotid siphons. Skull: Normal. Negative for fracture or focal lesion. Sinuses: Imaged portions  are clear. Orbits: No acute finding. Review of the MIP images confirms the above findings CTA NECK FINDINGS Aortic arch: Standard branching. Imaged portion shows no evidence of aneurysm or dissection. Calcified plaques are seen in the aortic arch in at the origin of the major arch vessels. There is no significant stenosis. Right carotid system: Small calcified plaques are seen along the right common carotid artery without significant stenosis. There is heavily calcified plaque at the right carotid bifurcation without hemodynamically significant stenosis of the right ICA. Moderate stenosis seen at the origin of the right ECA. The cervical segment of the right internal carotid artery is normal in course and caliber. Left carotid system: Small calcified plaques are seen along the left common carotid artery without significant stenosis. There is a calcified plaque at the left carotid bifurcation without hemodynamically significant stenosis. The cervical segment of the left internal carotid artery is normal in course and caliber. Vertebral arteries: Increased tortuosity is seen at the origin of the right vertebral artery noting calcified plaques along the V1 segment with 50% stenosis at the level of the C7 vertebral body. Remainder of the cervical segment of the right vertebral artery has normal caliber. The left vertebral artery has normal course and caliber throughout the neck. Skeleton: Mild degenerative changes of the lumbar spine. Other neck: Right vocal cord paralysis. Upper chest:  Negative Review of the MIP images confirms the above findings CTA HEAD FINDINGS Anterior circulation: Calcified plaques are seen in the bilateral carotid siphons without hemodynamically significant stenosis. A dominant bihemispheric left A2 segment is noted. The bilateral ACA and MCA vascular trees are normal in course and caliber. Posterior circulation: Small calcified plaque is seen at the left V4 segment, near the dural margin the, without significant stenosis. The right vertebral artery, the basilar artery in the right posterior cerebral artery are unremarkable. A short segment of mild-to-moderate stenosis is noted at the proximal left P2/PCA. Venous sinuses: As permitted by contrast timing, patent. Anatomic variants: Dominant bihemispheric left A2 segment. Review of the MIP images confirms the above findings IMPRESSION: 1. No acute intracranial abnormality. 2. Atherosclerotic disease of the bilateral is carotid bifurcations without hemodynamically significant stenosis. 3. Calcified plaques in both carotid siphons and left V4 segment without hemodynamically significant stenosis. 4. A 50% stenosis of the V1 segment of the right vertebral artery. 5. Focal mild to moderate stenosis at the proximal left P2/PCA segment. 6. Right vocal cord paralysis. Aortic Atherosclerosis (ICD10-I70.0). Electronically Signed   By: Pedro Earls M.D.   On: 09/23/2019 13:56   DG Chest 2 View  Result Date: 09/26/2019 CLINICAL DATA:  Dyspnea. COPD. EXAM: CHEST - 2 VIEW COMPARISON:  Chest radiograph 11/20/2014. Lung apices from neck CTA 3 days ago 09/23/2019 FINDINGS: Mild chronic hyperinflation and interstitial coarsening. No focal airspace disease. Normal heart size and mediastinal contours. Aortic atherosclerosis. No pulmonary edema, pleural effusion, or pneumothorax. No acute osseous abnormalities are seen. IMPRESSION: Chronic hyperinflation and interstitial coarsening consistent with history of COPD. No acute  abnormality. Aortic Atherosclerosis (ICD10-I70.0). Electronically Signed   By: Keith Rake M.D.   On: 09/26/2019 12:59   CT ANGIO NECK W OR WO CONTRAST  Result Date: 09/23/2019 CLINICAL DATA:  Dizziness and syncope. Carotid stenosis. EXAM: CT ANGIOGRAPHY HEAD AND NECK TECHNIQUE: Multidetector CT imaging of the head and neck was performed using the standard protocol during bolus administration of intravenous contrast. Multiplanar CT image reconstructions and MIPs were obtained to evaluate the vascular anatomy. Carotid stenosis measurements (when  applicable) are obtained utilizing NASCET criteria, using the distal internal carotid diameter as the denominator. CONTRAST:  44m OMNIPAQUE IOHEXOL 350 MG/ML SOLN COMPARISON:  None. FINDINGS: CT HEAD FINDINGS Brain: No evidence of acute infarction, hemorrhage, hydrocephalus, extra-axial collection or mass lesion/mass effect. Vascular: Calcified plaques are seen in the bilateral carotid siphons. Skull: Normal. Negative for fracture or focal lesion. Sinuses: Imaged portions are clear. Orbits: No acute finding. Review of the MIP images confirms the above findings CTA NECK FINDINGS Aortic arch: Standard branching. Imaged portion shows no evidence of aneurysm or dissection. Calcified plaques are seen in the aortic arch in at the origin of the major arch vessels. There is no significant stenosis. Right carotid system: Small calcified plaques are seen along the right common carotid artery without significant stenosis. There is heavily calcified plaque at the right carotid bifurcation without hemodynamically significant stenosis of the right ICA. Moderate stenosis seen at the origin of the right ECA. The cervical segment of the right internal carotid artery is normal in course and caliber. Left carotid system: Small calcified plaques are seen along the left common carotid artery without significant stenosis. There is a calcified plaque at the left carotid bifurcation without  hemodynamically significant stenosis. The cervical segment of the left internal carotid artery is normal in course and caliber. Vertebral arteries: Increased tortuosity is seen at the origin of the right vertebral artery noting calcified plaques along the V1 segment with 50% stenosis at the level of the C7 vertebral body. Remainder of the cervical segment of the right vertebral artery has normal caliber. The left vertebral artery has normal course and caliber throughout the neck. Skeleton: Mild degenerative changes of the lumbar spine. Other neck: Right vocal cord paralysis. Upper chest: Negative Review of the MIP images confirms the above findings CTA HEAD FINDINGS Anterior circulation: Calcified plaques are seen in the bilateral carotid siphons without hemodynamically significant stenosis. A dominant bihemispheric left A2 segment is noted. The bilateral ACA and MCA vascular trees are normal in course and caliber. Posterior circulation: Small calcified plaque is seen at the left V4 segment, near the dural margin the, without significant stenosis. The right vertebral artery, the basilar artery in the right posterior cerebral artery are unremarkable. A short segment of mild-to-moderate stenosis is noted at the proximal left P2/PCA. Venous sinuses: As permitted by contrast timing, patent. Anatomic variants: Dominant bihemispheric left A2 segment. Review of the MIP images confirms the above findings IMPRESSION: 1. No acute intracranial abnormality. 2. Atherosclerotic disease of the bilateral is carotid bifurcations without hemodynamically significant stenosis. 3. Calcified plaques in both carotid siphons and left V4 segment without hemodynamically significant stenosis. 4. A 50% stenosis of the V1 segment of the right vertebral artery. 5. Focal mild to moderate stenosis at the proximal left P2/PCA segment. 6. Right vocal cord paralysis. Aortic Atherosclerosis (ICD10-I70.0). Electronically Signed   By: KPedro EarlsM.D.   On: 09/23/2019 13:56    Anemia due to stage 3b chronic kidney disease #Anemia-secondary chronic kidney disease stage III/iron deficiency [hb-8-9].  Patient symptomatic with increasing fatigue shortness of breath on exertion.  Discussed etiology of anemia likely from chronic kidney disease.  Discussed that patient will need IV iron; also need erythropoietin injections if hemoglobin does not improve even after iron supplementation.  Discussed the potential acute infusion reactions with IV iron; which are quite rare.  Patient understands the risk; will proceed with infusions.  #Chronic shortness of breath/history of COPD-worsening check chest x-ray.  #Recommend checking  CBC CMP C-reactive protein chest x-ray iron studies haptoglobin LDH reticulocyte count J29 folic acid for further work-up.  Myeloma panel next visit.  DISPOSITION:  # labs today # CXR today  # Follow up in 2 weeks- MD; IV venofer- Dr.B  All questions were answered. The patient knows to call the clinic with any problems, questions or concerns.    Cammie Sickle, MD 10/02/2019 6:42 AM

## 2019-09-27 LAB — HAPTOGLOBIN: Haptoglobin: 364 mg/dL — ABNORMAL HIGH (ref 41–333)

## 2019-10-02 ENCOUNTER — Telehealth: Payer: Self-pay | Admitting: Internal Medicine

## 2019-10-02 NOTE — Telephone Encounter (Signed)
Please add IV Venofer at next visit.

## 2019-10-10 ENCOUNTER — Other Ambulatory Visit: Payer: Self-pay

## 2019-10-13 ENCOUNTER — Inpatient Hospital Stay (HOSPITAL_BASED_OUTPATIENT_CLINIC_OR_DEPARTMENT_OTHER): Payer: PPO | Admitting: Internal Medicine

## 2019-10-13 ENCOUNTER — Inpatient Hospital Stay: Payer: PPO

## 2019-10-13 ENCOUNTER — Other Ambulatory Visit: Payer: Self-pay

## 2019-10-13 ENCOUNTER — Encounter: Payer: Self-pay | Admitting: Internal Medicine

## 2019-10-13 VITALS — BP 184/65 | HR 96 | Temp 96.6°F | Wt 122.0 lb

## 2019-10-13 VITALS — BP 156/55 | HR 69 | Temp 97.8°F | Resp 18

## 2019-10-13 DIAGNOSIS — N1832 Chronic kidney disease, stage 3b: Secondary | ICD-10-CM | POA: Diagnosis not present

## 2019-10-13 DIAGNOSIS — D631 Anemia in chronic kidney disease: Secondary | ICD-10-CM | POA: Diagnosis not present

## 2019-10-13 DIAGNOSIS — E611 Iron deficiency: Secondary | ICD-10-CM | POA: Diagnosis not present

## 2019-10-13 MED ORDER — SODIUM CHLORIDE 0.9 % IV SOLN
Freq: Once | INTRAVENOUS | Status: AC
  Filled 2019-10-13: qty 250

## 2019-10-13 MED ORDER — SODIUM CHLORIDE 0.9 % IV SOLN: 200.0000 mg | Freq: Once | INTRAVENOUS | Status: DC

## 2019-10-13 MED ORDER — IRON SUCROSE 20 MG/ML IV SOLN
200.0000 mg | Freq: Once | INTRAVENOUS | Status: AC
  Administered 2019-10-13: 200 mg via INTRAVENOUS
  Filled 2019-10-13: qty 10

## 2019-10-13 NOTE — Progress Notes (Signed)
[facility] NOTE  Patient Care Team: Kirk Ruths, MD as PCP - General (Internal Medicine) Estill Cotta, MD as Consulting Physician (Ophthalmology)  CHIEF COMPLAINTS/PURPOSE OF CONSULTATION:    Robyn Butler 2021- Hb 8-9;[name];  Ferritin-100; PCP] [EGD/; colonoscopy-[facility];2016 ; capsule-]; iron saturation 20% ferritin-68; no hemolysis;   # CKD-III [GFR40s]  # COPD   HISTORY OF PRESENTING ILLNESS:  Robyn Butler 86 y.o.  female anemia/chronic kidney disease is here for follow-up.  Patient continues to be fatigued.  Has shortness of breath on exertion.  Review of Systems  Constitutional: Positive for malaise/fatigue. Negative for chills, diaphoresis, fever and weight loss.  HENT: Negative for nosebleeds and sore throat.   Eyes: Negative for double vision.  Respiratory: Positive for cough and shortness of breath. Negative for hemoptysis, sputum production and wheezing.   Cardiovascular: Negative for chest pain, palpitations, orthopnea and leg swelling.  Gastrointestinal: Negative for abdominal pain, blood in stool, constipation, diarrhea, heartburn, melena, nausea and vomiting.  Genitourinary: Negative for dysuria, frequency and urgency.  Musculoskeletal: Positive for back pain and joint pain.  Skin: Negative.  Negative for itching and rash.  Neurological: Negative for dizziness, tingling, focal weakness, weakness and headaches.  Endo/Heme/Allergies: Does not bruise/bleed easily.  Psychiatric/Behavioral: Negative for depression. The patient is not nervous/anxious and does not have insomnia.     MEDICAL HISTORY:  Past Medical History:  Diagnosis Date  . Arthritis   . Cancer ([place])   . Chronic kidney disease   . COPD (chronic obstructive pulmonary disease) ([place])   . Diabetes mellitus without complication ([place])    type 2  . Hypertension   . Varicose veins of lower extremities with other complications      SURGICAL HISTORY: Past Surgical History:  Procedure Laterality Date  . ABDOMINAL HYSTERECTOMY  1972  . BLADDER SUSPENSION    . BREAST EXCISIONAL BIOPSY Right 1999   neg  . CHOLECYSTECTOMY  1975  . COLONOSCOPY    . COLONOSCOPY WITH PROPOFOL N/A 05/24/2015   Procedure: COLONOSCOPY WITH PROPOFOL;  Surgeon: Hulen Luster, MD;  Location: [facility] ENDOSCOPY;  Service: Gastroenterology;  Laterality: N/A;  . [place]  . ESOPHAGOGASTRODUODENOSCOPY N/A 05/24/2015   Procedure: ESOPHAGOGASTRODUODENOSCOPY (EGD);  Surgeon: Hulen Luster, MD;  Location: [facility] ENDOSCOPY;  Service: Gastroenterology;  Laterality: N/A;  . FRACTURE SURGERY    . JOINT REPLACEMENT Right 2013   hip  . salpingo oophorectmy     . THYROIDECTOMY  1969  . WRIST FRACTURE SURGERY Left     SOCIAL HISTORY: Social History   Socioeconomic History  . Marital status: Widowed    Spouse name: Not on file  . Number of children: 2  . Years of education: 9th Grade  . Highest education level: 9th grade  Occupational History  . Occupation: Retired  Tobacco Use  . Smoking status: Former Smoker    Quit date: 07/18/1991    Years since quitting: 28.2  . Smokeless tobacco: Never Used  Substance and Sexual Activity  . Alcohol use: No  . Drug use: No  . Sexual activity: Not on file  Other Topics Concern  . Not on file  Social History Narrative   In [place] in senior place; quit smoking [> 25 years ago]; no alcohol; hosiery.    Social Determinants of Health   Financial Resource Strain:   . Difficulty of Paying Living Expenses:   Food Insecurity:   . Worried About Charity fundraiser  in the Last Year:   . [place] in the Last Year:   Transportation Needs:   . Film/video editor (Medical):   Marland Kitchen Lack of Transportation (Non-Medical):   Physical Activity: Inactive  . Days of Exercise per Week: 0 days  . Minutes of Exercise per Session: 0 min  Stress:   . Feeling of Stress :   Social Connections: Unknown  . Frequency  of Communication with Friends and Family: Patient refused  . Frequency of Social Gatherings with Friends and Family: Patient refused  . Attends Religious Services: Patient refused  . Active Member of Clubs or Organizations: Patient refused  . Attends Archivist Meetings: Patient refused  . Marital Status: Patient refused  Intimate Partner Violence: Unknown  . Fear of Current or Ex-Partner: Patient refused  . Emotionally Abused: Patient refused  . Physically Abused: Patient refused  . Sexually Abused: Patient refused    FAMILY HISTORY: Family History  Problem Relation Age of Onset  . Heart attack Father   . Asthma Father   . Kidney disease Mother   . Hypertension Mother   . Hypertension Other   . Diabetes Other   . Heart attack Other   . Breast cancer Neg Hx     ALLERGIES:  is allergic to alendronate.  MEDICATIONS:  Current Outpatient Medications  Medication Sig Dispense Refill  . amLODipine (NORVASC) 5 MG tablet TAKE ONE(1) TABLET EACH DAY 90 tablet 4  . aspirin EC 81 MG tablet Take 81 mg by mouth daily.     . Blood Glucose Monitoring Suppl (ONE TOUCH ULTRA 2) w/Device KIT Use to check blood sugar daily 1 each 0  . Cholecalciferol (VITAMIN D3) 50 MCG (2000 UT) TABS Take 1 tablet by mouth daily.    . dorzolamide (TRUSOPT) 2 % ophthalmic solution Place 1 drop into both eyes 2 (two) times daily.     . Fluticasone-Umeclidin-Vilant (TRELEGY ELLIPTA) 100-62.5-25 MCG/INH AEPB Inhale 1 puff into the lungs daily. TAKE IN PLACE OF SPIRIVA (Patient not taking: Reported on 07/22/2019) 2 each 0  . glipiZIDE-metformin (METAGLIP) 2.5-500 MG tablet TAKE ONE TABLET EVERY MORNING 90 tablet 4  . glucose blood (ONETOUCH ULTRA) test strip USE TO TEST BLOOD SUGAR TWICE DAILY 100 strip 4  . ibandronate (BONIVA) 150 MG tablet TAKE ONE TABLET ONCE A MONTH FIRST THING IN THE MORNING AT LEAST 1 HOUR BEFORE EATING, TAKE WITH WATER. 1 tablet 12  . Ibuprofen-diphenhydrAMINE Cit (ADVIL PM) 200-38 MG  TABS Take 1 tablet by mouth at bedtime as needed.    . Lancets (ONETOUCH ULTRASOFT) lancets Use as instructed to check blood sugar once a day 100 each 3  . latanoprost (XALATAN) 0.005 % ophthalmic solution Place 1 drop into both eyes at bedtime.     Marland Kitchen levothyroxine (SYNTHROID) 100 MCG tablet TAKE 1 TABLET BY MOUTH EVERY DAY 90 tablet 3  . loperamide (IMODIUM) 2 MG capsule Take 2 mg by mouth as needed for diarrhea or loose stools.    . lovastatin (MEVACOR) 40 MG tablet TAKE 1 TABLET BY MOUTH AT BEDTIME 90 tablet 0  . montelukast (SINGULAIR) 10 MG tablet Take 1 tablet (10 mg total) by mouth daily. For chronic cough (Patient not taking: Reported on 09/26/2019) 30 tablet 2  . naproxen (NAPROSYN) 375 MG tablet Take 1 tablet (375 mg total) by mouth daily as needed for mild pain. 90 tablet 2  . nortriptyline (PAMELOR) 25 MG capsule TAKE 1 CAPSULE(25 MG) BY MOUTH AT BEDTIME  90 capsule 0  . omeprazole (PRILOSEC) 40 MG capsule Take 1 capsule (40 mg total) by mouth daily. 90 capsule 4  . Polyethylene Glycol 3350 (DULCOLAX BALANCE PO) Take 2 tablets by mouth as needed.    Marland Kitchen SPIRIVA RESPIMAT 2.5 MCG/ACT AERS SMARTSIG:2 Puff(s) By Mouth Daily    . traMADol (ULTRAM) 50 MG tablet TAKE ONE TABLET EVERY 8 HOUR AS NEEDED FOR PAIN 90 tablet 1  . valsartan-hydrochlorothiazide (DIOVAN-HCT) 160-25 MG tablet Take 1 tablet by mouth daily. 90 tablet 4   No current facility-administered medications for this visit.      PHYSICAL EXAMINATION:   Vitals:   10/13/19 1031  BP: (!) 184/65  Pulse: 96  Temp: (!) 96.6 F (35.9 C)   Filed Weights   10/13/19 1031  Weight: 122 lb (55.3 kg)    Physical Exam  Constitutional: She is oriented to person, place, and time and well-developed, well-nourished, and in no distress.  Patient is accompanied by daughter.  HENT:  Head: Normocephalic and atraumatic.  Mouth/Throat: Oropharynx is clear and moist. No oropharyngeal exudate.  Eyes: Pupils are equal, round, and reactive  to light.  Cardiovascular: Normal rate and regular rhythm.  Pulmonary/Chest: No respiratory distress. She has no wheezes.  Decreased air entry bilateral lower lung bases.  Abdominal: Soft. Bowel sounds are normal. She exhibits no distension and no mass. There is no abdominal tenderness. There is no rebound and no guarding.  Musculoskeletal:        General: No tenderness or edema. Normal range of motion.     Cervical back: Normal range of motion and neck supple.  Neurological: She is alert and oriented to person, place, and time.  Skin: Skin is warm.  Psychiatric: Affect normal.    LABORATORY DATA:  I have reviewed the data as listed Lab Results  Component Value Date   WBC 6.3 09/26/2019   HGB 8.9 (L) 09/26/2019   HCT 29.7 (L) 09/26/2019   MCV 96.4 09/26/2019   PLT 268 09/26/2019   Recent Labs    01/07/19 1144 09/26/19 1204  NA 140 141  K 4.7 4.4  CL 104 107  CO2 21 23  GLUCOSE 85 102*  BUN 28* 28*  CREATININE 1.28* 1.34*  CALCIUM 9.5 8.7*  GFRNONAA 38* 35*  GFRAA 43* 41*  PROT 7.2 7.7  ALBUMIN 4.5 4.1  AST 12 17  ALT 9 10  ALKPHOS 62 64  BILITOT 0.3 0.6     CT ANGIO HEAD W OR WO CONTRAST  Result Date: 09/23/2019 CLINICAL DATA:  Dizziness and syncope. Carotid stenosis. EXAM: CT ANGIOGRAPHY HEAD AND NECK TECHNIQUE: Multidetector CT imaging of the head and neck was performed using the standard protocol during bolus administration of intravenous contrast. Multiplanar CT image reconstructions and MIPs were obtained to evaluate the vascular anatomy. Carotid stenosis measurements (when applicable) are obtained utilizing NASCET criteria, using the distal internal carotid diameter as the denominator. CONTRAST:  68m OMNIPAQUE IOHEXOL 350 MG/ML SOLN COMPARISON:  None. FINDINGS: CT HEAD FINDINGS Brain: No evidence of acute infarction, hemorrhage, hydrocephalus, extra-axial collection or mass lesion/mass effect. Vascular: Calcified plaques are seen in the bilateral carotid siphons.  Skull: Normal. Negative for fracture or focal lesion. Sinuses: Imaged portions are clear. Orbits: No acute finding. Review of the MIP images confirms the above findings CTA NECK FINDINGS Aortic arch: Standard branching. Imaged portion shows no evidence of aneurysm or dissection. Calcified plaques are seen in the aortic arch in at the origin of the major arch vessels.  There is no significant stenosis. Right carotid system: Small calcified plaques are seen along the right common carotid artery without significant stenosis. There is heavily calcified plaque at the right carotid bifurcation without hemodynamically significant stenosis of the right ICA. Moderate stenosis seen at the origin of the right ECA. The cervical segment of the right internal carotid artery is normal in course and caliber. Left carotid system: Small calcified plaques are seen along the left common carotid artery without significant stenosis. There is a calcified plaque at the left carotid bifurcation without hemodynamically significant stenosis. The cervical segment of the left internal carotid artery is normal in course and caliber. Vertebral arteries: Increased tortuosity is seen at the origin of the right vertebral artery noting calcified plaques along the V1 segment with 50% stenosis at the level of the C7 vertebral body. Remainder of the cervical segment of the right vertebral artery has normal caliber. The left vertebral artery has normal course and caliber throughout the neck. Skeleton: Mild degenerative changes of the lumbar spine. Other neck: Right vocal cord paralysis. Upper chest: Negative Review of the MIP images confirms the above findings CTA HEAD FINDINGS Anterior circulation: Calcified plaques are seen in the bilateral carotid siphons without hemodynamically significant stenosis. A dominant bihemispheric left A2 segment is noted. The bilateral ACA and MCA vascular trees are normal in course and caliber. Posterior circulation: Small  calcified plaque is seen at the left V4 segment, near the dural margin the, without significant stenosis. The right vertebral artery, the basilar artery in the right posterior cerebral artery are unremarkable. A short segment of mild-to-moderate stenosis is noted at the proximal left P2/PCA. Venous sinuses: As permitted by contrast timing, patent. Anatomic variants: Dominant bihemispheric left A2 segment. Review of the MIP images confirms the above findings IMPRESSION: 1. No acute intracranial abnormality. 2. Atherosclerotic disease of the bilateral is carotid bifurcations without hemodynamically significant stenosis. 3. Calcified plaques in both carotid siphons and left V4 segment without hemodynamically significant stenosis. 4. A 50% stenosis of the V1 segment of the right vertebral artery. 5. Focal mild to moderate stenosis at the proximal left P2/PCA segment. 6. Right vocal cord paralysis. Aortic Atherosclerosis (ICD10-I70.0). Electronically Signed   By: Pedro Earls M.D.   On: 09/23/2019 13:56   DG Chest 2 View  Result Date: 09/26/2019 CLINICAL DATA:  Dyspnea. COPD. EXAM: CHEST - 2 VIEW COMPARISON:  Chest radiograph 11/20/2014. Lung apices from neck CTA 3 days ago 09/23/2019 FINDINGS: Mild chronic hyperinflation and interstitial coarsening. No focal airspace disease. Normal heart size and mediastinal contours. Aortic atherosclerosis. No pulmonary edema, pleural effusion, or pneumothorax. No acute osseous abnormalities are seen. IMPRESSION: Chronic hyperinflation and interstitial coarsening consistent with history of COPD. No acute abnormality. Aortic Atherosclerosis (ICD10-I70.0). Electronically Signed   By: Keith Rake M.D.   On: 09/26/2019 12:59   CT ANGIO NECK W OR WO CONTRAST  Result Date: 09/23/2019 CLINICAL DATA:  Dizziness and syncope. Carotid stenosis. EXAM: CT ANGIOGRAPHY HEAD AND NECK TECHNIQUE: Multidetector CT imaging of the head and neck was performed using the standard  protocol during bolus administration of intravenous contrast. Multiplanar CT image reconstructions and MIPs were obtained to evaluate the vascular anatomy. Carotid stenosis measurements (when applicable) are obtained utilizing NASCET criteria, using the distal internal carotid diameter as the denominator. CONTRAST:  15m OMNIPAQUE IOHEXOL 350 MG/ML SOLN COMPARISON:  None. FINDINGS: CT HEAD FINDINGS Brain: No evidence of acute infarction, hemorrhage, hydrocephalus, extra-axial collection or mass lesion/mass effect. Vascular: Calcified plaques are  seen in the bilateral carotid siphons. Skull: Normal. Negative for fracture or focal lesion. Sinuses: Imaged portions are clear. Orbits: No acute finding. Review of the MIP images confirms the above findings CTA NECK FINDINGS Aortic arch: Standard branching. Imaged portion shows no evidence of aneurysm or dissection. Calcified plaques are seen in the aortic arch in at the origin of the major arch vessels. There is no significant stenosis. Right carotid system: Small calcified plaques are seen along the right common carotid artery without significant stenosis. There is heavily calcified plaque at the right carotid bifurcation without hemodynamically significant stenosis of the right ICA. Moderate stenosis seen at the origin of the right ECA. The cervical segment of the right internal carotid artery is normal in course and caliber. Left carotid system: Small calcified plaques are seen along the left common carotid artery without significant stenosis. There is a calcified plaque at the left carotid bifurcation without hemodynamically significant stenosis. The cervical segment of the left internal carotid artery is normal in course and caliber. Vertebral arteries: Increased tortuosity is seen at the origin of the right vertebral artery noting calcified plaques along the V1 segment with 50% stenosis at the level of the C7 vertebral body. Remainder of the cervical segment of the  right vertebral artery has normal caliber. The left vertebral artery has normal course and caliber throughout the neck. Skeleton: Mild degenerative changes of the lumbar spine. Other neck: Right vocal cord paralysis. Upper chest: Negative Review of the MIP images confirms the above findings CTA HEAD FINDINGS Anterior circulation: Calcified plaques are seen in the bilateral carotid siphons without hemodynamically significant stenosis. A dominant bihemispheric left A2 segment is noted. The bilateral ACA and MCA vascular trees are normal in course and caliber. Posterior circulation: Small calcified plaque is seen at the left V4 segment, near the dural margin the, without significant stenosis. The right vertebral artery, the basilar artery in the right posterior cerebral artery are unremarkable. A short segment of mild-to-moderate stenosis is noted at the proximal left P2/PCA. Venous sinuses: As permitted by contrast timing, patent. Anatomic variants: Dominant bihemispheric left A2 segment. Review of the MIP images confirms the above findings IMPRESSION: 1. No acute intracranial abnormality. 2. Atherosclerotic disease of the bilateral is carotid bifurcations without hemodynamically significant stenosis. 3. Calcified plaques in both carotid siphons and left V4 segment without hemodynamically significant stenosis. 4. A 50% stenosis of the V1 segment of the right vertebral artery. 5. Focal mild to moderate stenosis at the proximal left P2/PCA segment. 6. Right vocal cord paralysis. Aortic Atherosclerosis (ICD10-I70.0). Electronically Signed   By: Pedro Earls M.D.   On: 09/23/2019 13:56    Anemia due to stage 3b chronic kidney disease #Anemia-secondary chronic kidney disease stage III/iron deficiency [hb-8-9]. Iron sat- 20 &; ferritin-68.  Proceed with iron infusions today.  If not improved would recommend erythropoietin stimulating agent.  Also discussed regarding bone marrow biopsy if above  interventions not helpful.  #Chronic shortness of breath/history of COPD-stable.  Chest x-ray reviewed no acute process.  #Above plan was discussed with patient and daughter in detail; in agreement.  DISPOSITION:  # Venofer today # Venofer weekly x3 # Follow up in 4 weeks- MD;labs- cbc/bmp;MM panel; K/L light chains; IV venofer- Dr.B  All questions were answered. The patient knows to call the clinic with any problems, questions or concerns.    Cammie Sickle, MD 10/13/2019 11:03 AM

## 2019-10-13 NOTE — Progress Notes (Signed)
Pt tolerated infusion well. Pt and VS stable at discharge.  

## 2019-10-13 NOTE — Assessment & Plan Note (Signed)
#  Anemia-secondary chronic kidney disease stage III/iron deficiency [hb-8-9]. Iron sat- 20 &; ferritin-68.  Proceed with iron infusions today.  If not improved would recommend erythropoietin stimulating agent.  Also discussed regarding bone marrow biopsy if above interventions not helpful.  #Chronic shortness of breath/history of COPD-stable.  Chest x-ray reviewed no acute process.  #Above plan was discussed with patient and daughter in detail; in agreement.  DISPOSITION:  # Venofer today # Venofer weekly x3 # Follow up in 4 weeks- MD;labs- cbc/bmp;MM panel; K/L light chains; IV venofer- Dr.B

## 2019-10-20 ENCOUNTER — Inpatient Hospital Stay: Payer: PPO | Attending: Internal Medicine

## 2019-10-20 ENCOUNTER — Other Ambulatory Visit: Payer: Self-pay

## 2019-10-20 VITALS — BP 154/67 | HR 75 | Temp 97.8°F | Resp 20

## 2019-10-20 DIAGNOSIS — E611 Iron deficiency: Secondary | ICD-10-CM | POA: Diagnosis not present

## 2019-10-20 DIAGNOSIS — N183 Chronic kidney disease, stage 3 unspecified: Secondary | ICD-10-CM | POA: Diagnosis not present

## 2019-10-20 DIAGNOSIS — D631 Anemia in chronic kidney disease: Secondary | ICD-10-CM | POA: Insufficient documentation

## 2019-10-20 DIAGNOSIS — N1832 Chronic kidney disease, stage 3b: Secondary | ICD-10-CM

## 2019-10-20 MED ORDER — SODIUM CHLORIDE 0.9 % IV SOLN
Freq: Once | INTRAVENOUS | Status: AC
  Filled 2019-10-20: qty 250

## 2019-10-20 MED ORDER — IRON SUCROSE 20 MG/ML IV SOLN
200.0000 mg | Freq: Once | INTRAVENOUS | Status: AC
  Administered 2019-10-20: 200 mg via INTRAVENOUS
  Filled 2019-10-20: qty 10

## 2019-10-20 MED ORDER — SODIUM CHLORIDE 0.9 % IV SOLN: 200.0000 mg | Freq: Once | INTRAVENOUS | Status: DC

## 2019-10-27 ENCOUNTER — Ambulatory Visit: Payer: PPO

## 2019-10-28 ENCOUNTER — Inpatient Hospital Stay: Payer: PPO

## 2019-10-28 ENCOUNTER — Other Ambulatory Visit: Payer: Self-pay

## 2019-10-28 VITALS — BP 151/64 | HR 90 | Temp 98.0°F | Resp 18

## 2019-10-28 DIAGNOSIS — N1832 Chronic kidney disease, stage 3b: Secondary | ICD-10-CM

## 2019-10-28 DIAGNOSIS — E611 Iron deficiency: Secondary | ICD-10-CM | POA: Diagnosis not present

## 2019-10-28 MED ORDER — SODIUM CHLORIDE 0.9 % IV SOLN: 200.0000 mg | Freq: Once | INTRAVENOUS | Status: DC

## 2019-10-28 MED ORDER — IRON SUCROSE 20 MG/ML IV SOLN
200.0000 mg | Freq: Once | INTRAVENOUS | Status: AC
  Administered 2019-10-28: 200 mg via INTRAVENOUS
  Filled 2019-10-28: qty 10

## 2019-10-28 MED ORDER — SODIUM CHLORIDE 0.9 % IV SOLN
Freq: Once | INTRAVENOUS | Status: AC
  Filled 2019-10-28: qty 250

## 2019-11-03 ENCOUNTER — Inpatient Hospital Stay: Payer: PPO

## 2019-11-03 ENCOUNTER — Other Ambulatory Visit: Payer: Self-pay

## 2019-11-03 VITALS — BP 150/61 | HR 76 | Resp 18

## 2019-11-03 DIAGNOSIS — E611 Iron deficiency: Secondary | ICD-10-CM | POA: Diagnosis not present

## 2019-11-03 DIAGNOSIS — N1832 Chronic kidney disease, stage 3b: Secondary | ICD-10-CM

## 2019-11-03 MED ORDER — SODIUM CHLORIDE 0.9 % IV SOLN
Freq: Once | INTRAVENOUS | Status: AC
  Filled 2019-11-03: qty 250

## 2019-11-03 MED ORDER — IRON SUCROSE 20 MG/ML IV SOLN
200.0000 mg | Freq: Once | INTRAVENOUS | Status: AC
  Administered 2019-11-03: 200 mg via INTRAVENOUS
  Filled 2019-11-03: qty 10

## 2019-11-10 ENCOUNTER — Inpatient Hospital Stay (HOSPITAL_BASED_OUTPATIENT_CLINIC_OR_DEPARTMENT_OTHER): Payer: PPO | Admitting: Internal Medicine

## 2019-11-10 ENCOUNTER — Encounter: Payer: Self-pay | Admitting: Internal Medicine

## 2019-11-10 ENCOUNTER — Inpatient Hospital Stay: Payer: PPO

## 2019-11-10 ENCOUNTER — Other Ambulatory Visit: Payer: Self-pay

## 2019-11-10 DIAGNOSIS — N1832 Chronic kidney disease, stage 3b: Secondary | ICD-10-CM

## 2019-11-10 DIAGNOSIS — D631 Anemia in chronic kidney disease: Secondary | ICD-10-CM

## 2019-11-10 DIAGNOSIS — E611 Iron deficiency: Secondary | ICD-10-CM | POA: Diagnosis not present

## 2019-11-10 LAB — CBC WITH DIFFERENTIAL/PLATELET
Abs Immature Granulocytes: 0.05 10*3/uL (ref 0.00–0.07)
Basophils Absolute: 0 10*3/uL (ref 0.0–0.1)
Basophils Relative: 0 %
Eosinophils Absolute: 0.1 10*3/uL (ref 0.0–0.5)
Eosinophils Relative: 2 %
HCT: 32.3 % — ABNORMAL LOW (ref 36.0–46.0)
Hemoglobin: 9.8 g/dL — ABNORMAL LOW (ref 12.0–15.0)
Immature Granulocytes: 1 %
Lymphocytes Relative: 29 %
Lymphs Abs: 1.7 10*3/uL (ref 0.7–4.0)
MCH: 29.8 pg (ref 26.0–34.0)
MCHC: 30.3 g/dL (ref 30.0–36.0)
MCV: 98.2 fL (ref 80.0–100.0)
Monocytes Absolute: 0.5 10*3/uL (ref 0.1–1.0)
Monocytes Relative: 8 %
Neutro Abs: 3.6 10*3/uL (ref 1.7–7.7)
Neutrophils Relative %: 60 %
Platelets: 211 10*3/uL (ref 150–400)
RBC: 3.29 MIL/uL — ABNORMAL LOW (ref 3.87–5.11)
RDW: 13.8 % (ref 11.5–15.5)
WBC: 5.9 10*3/uL (ref 4.0–10.5)
nRBC: 0 % (ref 0.0–0.2)

## 2019-11-10 LAB — BASIC METABOLIC PANEL
Anion gap: 11 (ref 5–15)
BUN: 26 mg/dL — ABNORMAL HIGH (ref 8–23)
CO2: 25 mmol/L (ref 22–32)
Calcium: 9.1 mg/dL (ref 8.9–10.3)
Chloride: 103 mmol/L (ref 98–111)
Creatinine, Ser: 1.21 mg/dL — ABNORMAL HIGH (ref 0.44–1.00)
GFR calc Af Amer: 46 mL/min — ABNORMAL LOW (ref >60)
GFR calc non Af Amer: 40 mL/min — ABNORMAL LOW (ref >60)
Glucose, Bld: 157 mg/dL — ABNORMAL HIGH (ref 70–99)
Potassium: 4.1 mmol/L (ref 3.5–5.1)
Sodium: 139 mmol/L (ref 135–145)

## 2019-11-10 MED ORDER — IRON SUCROSE 20 MG/ML IV SOLN
200.0000 mg | Freq: Once | INTRAVENOUS | Status: AC
  Administered 2019-11-10: 200 mg via INTRAVENOUS
  Filled 2019-11-10: qty 10

## 2019-11-10 MED ORDER — SODIUM CHLORIDE 0.9 % IV SOLN
Freq: Once | INTRAVENOUS | Status: AC
  Filled 2019-11-10: qty 250

## 2019-11-10 NOTE — Progress Notes (Signed)
[place] NOTE  Patient Care Team: Kirk Ruths, MD as PCP - General (Internal Medicine) Dingeldein, Remo Lipps, MD as Consulting Physician (Ophthalmology) Cammie Sickle, MD as Consulting Physician (Hematology and Oncology)  CHIEF COMPLAINTS/PURPOSE OF CONSULTATION:    [name];[name];  Ferritin-100; PCP] [EGD/; colonoscopy-[facility];2016 ; capsule-]; iron saturation 20% ferritin-68; no hemolysis;   # CKD-III [GFR40s]  # COPD   HISTORY OF PRESENTING ILLNESS:  Robyn Butler 85 y.o.  female anemia/chronic kidney disease is here for follow-up.  In the interim patient underwent IV iron infusion.  Notes to have slight improvement of her energy levels.  However continues to get short of breath on exertion.  Review of Systems  Constitutional: Positive for malaise/fatigue. Negative for chills, diaphoresis, fever and weight loss.  HENT: Negative for nosebleeds and sore throat.   Eyes: Negative for double vision.  Respiratory: Positive for cough and shortness of breath. Negative for hemoptysis, sputum production and wheezing.   Cardiovascular: Negative for chest pain, palpitations, orthopnea and leg swelling.  Gastrointestinal: Negative for abdominal pain, blood in stool, constipation, diarrhea, heartburn, melena, nausea and vomiting.  Genitourinary: Negative for dysuria, frequency and urgency.  Musculoskeletal: Positive for back pain and joint pain.  Skin: Negative.  Negative for itching and rash.  Neurological: Negative for dizziness, tingling, focal weakness, weakness and headaches.  Endo/Heme/Allergies: Does not bruise/bleed easily.  Psychiatric/Behavioral: Negative for depression. The patient is not nervous/anxious and does not have insomnia.     MEDICAL HISTORY:  Past Medical History:  Diagnosis Date  . Arthritis   . Cancer ([place])   . Chronic kidney disease   . COPD (chronic obstructive  pulmonary disease) ([place])   . Diabetes mellitus without complication ([place])    type 2  . Hypertension   . Varicose veins of lower extremities with other complications     SURGICAL HISTORY: Past Surgical History:  Procedure Laterality Date  . ABDOMINAL HYSTERECTOMY  1972  . BLADDER SUSPENSION    . BREAST EXCISIONAL BIOPSY Right 1999   neg  . CHOLECYSTECTOMY  1975  . COLONOSCOPY    . COLONOSCOPY WITH PROPOFOL N/A 05/24/2015   Procedure: COLONOSCOPY WITH PROPOFOL;  Surgeon: Hulen Luster, MD;  Location[facility] ENDOSCOPY;  Service: Gastroenterology;  Laterality: N/A;  . [place]  . ESOPHAGOGASTRODUODENOSCOPY N/A 05/24/2015   Procedure: ESOPHAGOGASTRODUODENOSCOPY (EGD);  Surgeon: Hulen Luster, MD;  Location: [facility] ENDOSCOPY;  Service: Gastroenterology;  Laterality: N/A;  . FRACTURE SURGERY    . JOINT REPLACEMENT Right 2013   hip  . salpingo oophorectmy     . THYROIDECTOMY  1969  . WRIST FRACTURE SURGERY Left     SOCIAL HISTORY: Social History   Socioeconomic History  . Marital status: Widowed    Spouse name: Not on file  . Number of children: 2  . Years of education: 9th Grade  . Highest education level: 9th grade  Occupational History  . Occupation: Retired  Tobacco Use  . Smoking status: Former Smoker    Quit date: 07/18/1991    Years since quitting: 28.3  . Smokeless tobacco: Never Used  Substance and Sexual Activity  . Alcohol use: No  . Drug use: No  . Sexual activity: Not on file  Other Topics Concern  . Not on file  Social History Narrative   In [place] in senior place; quit smoking [> 25 years ago]; no alcohol; hosiery.    Social Determinants of Health  Financial Resource Strain:   . Difficulty of Paying Living Expenses:   Food Insecurity:   . Worried About Charity fundraiser in the Last Year:   . Arboriculturist in the Last Year:   Transportation Needs:   . Film/video editor (Medical):   Marland Kitchen Lack of Transportation (Non-Medical):   Physical Activity:  Inactive  . Days of Exercise per Week: 0 days  . Minutes of Exercise per Session: 0 min  Stress:   . Feeling of Stress :   Social Connections: Unknown  . Frequency of Communication with Friends and Family: Patient refused  . Frequency of Social Gatherings with Friends and Family: Patient refused  . Attends Religious Services: Patient refused  . Active Member of Clubs or Organizations: Patient refused  . Attends Archivist Meetings: Patient refused  . Marital Status: Patient refused  Intimate Partner Violence: Unknown  . Fear of Current or Ex-Partner: Patient refused  . Emotionally Abused: Patient refused  . Physically Abused: Patient refused  . Sexually Abused: Patient refused    FAMILY HISTORY: Family History  Problem Relation Age of Onset  . Heart attack Father   . Asthma Father   . Kidney disease Mother   . Hypertension Mother   . Hypertension Other   . Diabetes Other   . Heart attack Other   . Breast cancer Neg Hx     ALLERGIES:  is allergic to alendronate.  MEDICATIONS:  Current Outpatient Medications  Medication Sig Dispense Refill  . amLODipine (NORVASC) 5 MG tablet TAKE ONE(1) TABLET EACH DAY 90 tablet 4  . aspirin EC 81 MG tablet Take 81 mg by mouth daily.     . Blood Glucose Monitoring Suppl (ONE TOUCH ULTRA 2) w/Device KIT Use to check blood sugar daily 1 each 0  . Cholecalciferol (VITAMIN D3) 50 MCG (2000 UT) TABS Take 1 tablet by mouth daily.    . dorzolamide (TRUSOPT) 2 % ophthalmic solution Place 1 drop into both eyes 2 (two) times daily.     . Fluticasone-Umeclidin-Vilant (TRELEGY ELLIPTA) 100-62.5-25 MCG/INH AEPB Inhale 1 puff into the lungs daily. TAKE IN PLACE OF SPIRIVA 2 each 0  . glipiZIDE-metformin (METAGLIP) 2.5-500 MG tablet TAKE ONE TABLET EVERY MORNING 90 tablet 4  . glucose blood (ONETOUCH ULTRA) test strip USE TO TEST BLOOD SUGAR TWICE DAILY 100 strip 4  . ibandronate (BONIVA) 150 MG tablet TAKE ONE TABLET ONCE A MONTH FIRST THING IN  THE MORNING AT LEAST 1 HOUR BEFORE EATING, TAKE WITH WATER. 1 tablet 12  . Ibuprofen-diphenhydrAMINE Cit (ADVIL PM) 200-38 MG TABS Take 1 tablet by mouth at bedtime as needed.    . Lancets (ONETOUCH ULTRASOFT) lancets Use as instructed to check blood sugar once a day 100 each 3  . latanoprost (XALATAN) 0.005 % ophthalmic solution Place 1 drop into both eyes at bedtime.     Marland Kitchen levothyroxine (SYNTHROID) 100 MCG tablet TAKE 1 TABLET BY MOUTH EVERY DAY 90 tablet 3  . loperamide (IMODIUM) 2 MG capsule Take 2 mg by mouth as needed for diarrhea or loose stools.    . lovastatin (MEVACOR) 40 MG tablet TAKE 1 TABLET BY MOUTH AT BEDTIME 90 tablet 0  . montelukast (SINGULAIR) 10 MG tablet Take 1 tablet (10 mg total) by mouth daily. For chronic cough 30 tablet 2  . naproxen (NAPROSYN) 375 MG tablet Take 1 tablet (375 mg total) by mouth daily as needed for mild pain. 90 tablet 2  . nortriptyline (  PAMELOR) 25 MG capsule TAKE 1 CAPSULE(25 MG) BY MOUTH AT BEDTIME 90 capsule 0  . omeprazole (PRILOSEC) 40 MG capsule Take 1 capsule (40 mg total) by mouth daily. 90 capsule 4  . Polyethylene Glycol 3350 (DULCOLAX BALANCE PO) Take 2 tablets by mouth as needed.    Marland Kitchen SPIRIVA RESPIMAT 2.5 MCG/ACT AERS SMARTSIG:2 Puff(s) By Mouth Daily    . traMADol (ULTRAM) 50 MG tablet TAKE ONE TABLET EVERY 8 HOUR AS NEEDED FOR PAIN 90 tablet 1  . valsartan-hydrochlorothiazide (DIOVAN-HCT) 160-25 MG tablet Take 1 tablet by mouth daily. 90 tablet 4   No current facility-administered medications for this visit.      PHYSICAL EXAMINATION:   Vitals:   11/10/19 1311  BP: (!) 183/72  Pulse: 97  Temp: (!) 96.9 F (36.1 C)   Filed Weights   11/10/19 1311  Weight: 120 lb 4 oz (54.5 kg)    Physical Exam  Constitutional: She is oriented to person, place, and time and well-developed, well-nourished, and in no distress.  Patient is accompanied by daughter.  HENT:  Head: Normocephalic and atraumatic.  Mouth/Throat: Oropharynx is  clear and moist. No oropharyngeal exudate.  Eyes: Pupils are equal, round, and reactive to light.  Cardiovascular: Normal rate and regular rhythm.  Pulmonary/Chest: No respiratory distress. She has no wheezes.  Decreased air entry bilateral lower lung bases.  Abdominal: Soft. Bowel sounds are normal. She exhibits no distension and no mass. There is no abdominal tenderness. There is no rebound and no guarding.  Musculoskeletal:        General: No tenderness or edema. Normal range of motion.     Cervical back: Normal range of motion and neck supple.  Neurological: She is alert and oriented to person, place, and time.  Skin: Skin is warm.  Psychiatric: Affect normal.    LABORATORY DATA:  I have reviewed the data as listed Lab Results  Component Value Date   WBC 5.9 11/10/2019   HGB 9.8 (L) 11/10/2019   HCT 32.3 (L) 11/10/2019   MCV 98.2 11/10/2019   PLT 211 11/10/2019   Recent Labs    01/07/19 1144 09/26/19 1204 11/10/19 1238  NA 140 141 139  K 4.7 4.4 4.1  CL 104 107 103  CO2 '21 23 25  ' GLUCOSE 85 102* 157*  BUN 28* 28* 26*  CREATININE 1.28* 1.34* 1.21*  CALCIUM 9.5 8.7* 9.1  GFRNONAA 38* 35* 40*  GFRAA 43* 41* 46*  PROT 7.2 7.7  --   ALBUMIN 4.5 4.1  --   AST 12 17  --   ALT 9 10  --   ALKPHOS 62 64  --   BILITOT 0.3 0.6  --      No results found.  Anemia due to stage 3b chronic kidney disease #Anemia-secondary chronic kidney disease stage III/iron deficiency [hb-8-9]. Iron sat- 20 &; ferritin-68.  Status post IV iron infusion hemoglobin 9.8.  Proceed with Venofer today.  Discussed that if it continues to run low would recommend Retacrit.  #Above plan was discussed with patient and daughter in detail; in agreement.  DISPOSITION:  # Venofer today # # Follow up in 6month MD ;labs-cbc/bmp/  iron studies/feritin- possible SQ retacrit; Dr.B  All questions were answered. The patient knows to call the clinic with any problems, questions or concerns.    GCammie Sickle MD 11/16/2019 10:59 PM

## 2019-11-10 NOTE — Assessment & Plan Note (Addendum)
#  Anemia-secondary chronic kidney disease stage III/iron deficiency [hb-8-9]. Iron sat- 20 &; ferritin-68.  Status post IV iron infusion hemoglobin 9.8.  Proceed with Venofer today.  Discussed that if it continues to run low would recommend Retacrit.  #Above plan was discussed with patient and daughter in detail; in agreement.  DISPOSITION:  # Venofer today # # Follow up in 65month- MD ;labs-cbc/bmp/  iron studies/feritin- possible SQ retacrit; Dr.B

## 2019-11-11 LAB — KAPPA/LAMBDA LIGHT CHAINS
Kappa free light chain: 115 mg/L — ABNORMAL HIGH (ref 3.3–19.4)
Kappa, lambda light chain ratio: 1.25 (ref 0.26–1.65)
Lambda free light chains: 92.2 mg/L — ABNORMAL HIGH (ref 5.7–26.3)

## 2019-11-13 LAB — MULTIPLE MYELOMA PANEL, SERUM
Albumin SerPl Elph-Mcnc: 4.1 g/dL (ref 2.9–4.4)
Albumin/Glob SerPl: 1.2 (ref 0.7–1.7)
Alpha 1: 0.2 g/dL (ref 0.0–0.4)
Alpha2 Glob SerPl Elph-Mcnc: 1 g/dL (ref 0.4–1.0)
B-Globulin SerPl Elph-Mcnc: 1.1 g/dL (ref 0.7–1.3)
Gamma Glob SerPl Elph-Mcnc: 1.1 g/dL (ref 0.4–1.8)
Globulin, Total: 3.5 g/dL (ref 2.2–3.9)
IgA: 419 mg/dL (ref 64–422)
IgG (Immunoglobin G), Serum: 1136 mg/dL (ref 586–1602)
IgM (Immunoglobulin M), Srm: 82 mg/dL (ref 26–217)
Total Protein ELP: 7.6 g/dL (ref 6.0–8.5)

## 2019-12-08 DIAGNOSIS — E119 Type 2 diabetes mellitus without complications: Secondary | ICD-10-CM | POA: Diagnosis not present

## 2019-12-23 DIAGNOSIS — E039 Hypothyroidism, unspecified: Secondary | ICD-10-CM | POA: Diagnosis not present

## 2019-12-23 DIAGNOSIS — N183 Chronic kidney disease, stage 3 unspecified: Secondary | ICD-10-CM | POA: Diagnosis not present

## 2019-12-23 DIAGNOSIS — E7800 Pure hypercholesterolemia, unspecified: Secondary | ICD-10-CM | POA: Diagnosis not present

## 2019-12-23 DIAGNOSIS — E118 Type 2 diabetes mellitus with unspecified complications: Secondary | ICD-10-CM | POA: Diagnosis not present

## 2019-12-23 DIAGNOSIS — J449 Chronic obstructive pulmonary disease, unspecified: Secondary | ICD-10-CM | POA: Diagnosis not present

## 2019-12-23 DIAGNOSIS — M48062 Spinal stenosis, lumbar region with neurogenic claudication: Secondary | ICD-10-CM | POA: Diagnosis not present

## 2019-12-23 DIAGNOSIS — E1122 Type 2 diabetes mellitus with diabetic chronic kidney disease: Secondary | ICD-10-CM | POA: Diagnosis not present

## 2019-12-23 DIAGNOSIS — I10 Essential (primary) hypertension: Secondary | ICD-10-CM | POA: Diagnosis not present

## 2019-12-26 ENCOUNTER — Other Ambulatory Visit: Payer: Self-pay | Admitting: Family Medicine

## 2019-12-26 NOTE — Telephone Encounter (Signed)
Requested medication (s) are due for refill today: yes  Requested medication (s) are on the active medication list: yes  Last refill: 05/2019 Future visit scheduled: no, LOV 5 months ago  Notes to clinic:  Previously ordered by historical provider    Requested Prescriptions  Pending Prescriptions Disp Refills   [place] 2.5 MCG/ACT AERS [Pharmacy Med Name: Spiriva Respimat 2.5 MCG/ACT Inhalation Aerosol Solution] 12 g 0    Sig: INHALE 2 SPRAY(S) BY MOUTH ONCE DAILY      Pulmonology:  Anticholinergic Agents Passed - 12/26/2019  5:23 PM      Passed - Valid encounter within last 12 months    Recent Outpatient Visits           5 months ago Acute bronchitis with COPD [facility])   [facility] Birdie Sons, MD   5 months ago Acute bronchitis with COPD [facility])   [facility] Fenton Malling M, [place]   11 months ago Essential (primary) hypertension   [facility] Birdie Sons, MD   1 year ago Controlled type 2 diabetes mellitus with complication, without long-term current use of insulin [facility])   [facility] Birdie Sons, MD   1 year ago Controlled type 2 diabetes mellitus with complication, without long-term current use of insulin [facility])   [facility] Caryn Section, Kirstie Peri, MD

## 2020-01-13 ENCOUNTER — Other Ambulatory Visit: Payer: Self-pay

## 2020-01-13 DIAGNOSIS — N1832 Chronic kidney disease, stage 3b: Secondary | ICD-10-CM

## 2020-01-14 ENCOUNTER — Other Ambulatory Visit: Payer: Self-pay

## 2020-01-14 ENCOUNTER — Inpatient Hospital Stay: Payer: PPO | Attending: Internal Medicine

## 2020-01-14 ENCOUNTER — Encounter: Payer: Self-pay | Admitting: Internal Medicine

## 2020-01-14 ENCOUNTER — Inpatient Hospital Stay (HOSPITAL_BASED_OUTPATIENT_CLINIC_OR_DEPARTMENT_OTHER): Payer: PPO | Admitting: Internal Medicine

## 2020-01-14 ENCOUNTER — Inpatient Hospital Stay: Payer: PPO

## 2020-01-14 DIAGNOSIS — N1832 Chronic kidney disease, stage 3b: Secondary | ICD-10-CM | POA: Diagnosis not present

## 2020-01-14 DIAGNOSIS — N183 Chronic kidney disease, stage 3 unspecified: Secondary | ICD-10-CM | POA: Diagnosis not present

## 2020-01-14 DIAGNOSIS — D631 Anemia in chronic kidney disease: Secondary | ICD-10-CM

## 2020-01-14 DIAGNOSIS — Z79899 Other long term (current) drug therapy: Secondary | ICD-10-CM | POA: Diagnosis not present

## 2020-01-14 DIAGNOSIS — Z87891 Personal history of nicotine dependence: Secondary | ICD-10-CM | POA: Insufficient documentation

## 2020-01-14 DIAGNOSIS — Z7982 Long term (current) use of aspirin: Secondary | ICD-10-CM | POA: Diagnosis not present

## 2020-01-14 DIAGNOSIS — Z7984 Long term (current) use of oral hypoglycemic drugs: Secondary | ICD-10-CM | POA: Insufficient documentation

## 2020-01-14 DIAGNOSIS — J449 Chronic obstructive pulmonary disease, unspecified: Secondary | ICD-10-CM | POA: Diagnosis not present

## 2020-01-14 DIAGNOSIS — Z791 Long term (current) use of non-steroidal anti-inflammatories (NSAID): Secondary | ICD-10-CM | POA: Insufficient documentation

## 2020-01-14 LAB — COMPREHENSIVE METABOLIC PANEL
ALT: 13 U/L (ref 0–44)
AST: 15 U/L (ref 15–41)
Albumin: 4.1 g/dL (ref 3.5–5.0)
Alkaline Phosphatase: 56 U/L (ref 38–126)
Anion gap: 8 (ref 5–15)
BUN: 20 mg/dL (ref 8–23)
CO2: 26 mmol/L (ref 22–32)
Calcium: 9 mg/dL (ref 8.9–10.3)
Chloride: 107 mmol/L (ref 98–111)
Creatinine, Ser: 1.16 mg/dL — ABNORMAL HIGH (ref 0.44–1.00)
GFR calc Af Amer: 49 mL/min — ABNORMAL LOW (ref >60)
GFR calc non Af Amer: 42 mL/min — ABNORMAL LOW (ref >60)
Glucose, Bld: 86 mg/dL (ref 70–99)
Potassium: 4.4 mmol/L (ref 3.5–5.1)
Sodium: 141 mmol/L (ref 135–145)
Total Bilirubin: 0.5 mg/dL (ref 0.3–1.2)
Total Protein: 7.6 g/dL (ref 6.5–8.1)

## 2020-01-14 LAB — CBC WITH DIFFERENTIAL/PLATELET
Abs Immature Granulocytes: 0.04 10*3/uL (ref 0.00–0.07)
Basophils Absolute: 0 10*3/uL (ref 0.0–0.1)
Basophils Relative: 0 %
Eosinophils Absolute: 0 10*3/uL (ref 0.0–0.5)
Eosinophils Relative: 0 %
HCT: 32.3 % — ABNORMAL LOW (ref 36.0–46.0)
Hemoglobin: 9.9 g/dL — ABNORMAL LOW (ref 12.0–15.0)
Immature Granulocytes: 1 %
Lymphocytes Relative: 19 %
Lymphs Abs: 1.4 10*3/uL (ref 0.7–4.0)
MCH: 30.7 pg (ref 26.0–34.0)
MCHC: 30.7 g/dL (ref 30.0–36.0)
MCV: 100 fL (ref 80.0–100.0)
Monocytes Absolute: 0.7 10*3/uL (ref 0.1–1.0)
Monocytes Relative: 9 %
Neutro Abs: 5.2 10*3/uL (ref 1.7–7.7)
Neutrophils Relative %: 71 %
Platelets: 184 10*3/uL (ref 150–400)
RBC: 3.23 MIL/uL — ABNORMAL LOW (ref 3.87–5.11)
RDW: 13 % (ref 11.5–15.5)
WBC: 7.3 10*3/uL (ref 4.0–10.5)
nRBC: 0 % (ref 0.0–0.2)

## 2020-01-14 LAB — IRON AND TIBC
Iron: 49 ug/dL (ref 28–170)
Saturation Ratios: 22 % (ref 10.4–31.8)
TIBC: 223 ug/dL — ABNORMAL LOW (ref 250–450)
UIBC: 174 ug/dL

## 2020-01-14 LAB — FERRITIN: Ferritin: 240 ng/mL (ref 11–307)

## 2020-01-14 MED ORDER — EPOETIN ALFA-EPBX 10000 UNIT/ML IJ SOLN
20000.0000 [IU] | Freq: Once | INTRAMUSCULAR | Status: AC
  Administered 2020-01-14: 20000 [IU] via SUBCUTANEOUS
  Filled 2020-01-14: qty 2

## 2020-01-14 NOTE — Progress Notes (Signed)
[place] NOTE  Patient Care Team: Kirk Ruths, MD as PCP - General (Internal Medicine) Dingeldein, Remo Lipps, MD as Consulting Physician (Ophthalmology) Cammie Sickle, MD as Consulting Physician (Hematology and Oncology)  CHIEF COMPLAINTS/PURPOSE OF CONSULTATION:    [place] CKD-III [name] 2021- Hb 8-9;[name];  Ferritin-100; PCP] [EGD/; colonoscopy-[facility];2016 ; capsule-]; iron saturation 20% ferritin-68; no hemolysis;   # CKD-III [GFR40s]  # COPD   HISTORY OF PRESENTING ILLNESS:  Robyn Butler 86 y.o.  female anemia/chronic kidney disease is here for follow-up.  S/p IV iron infusions patient noted to have improvement of energy levels.  Mild shortness of breath on exertion.  Mild cough   Review of Systems  Constitutional: Positive for malaise/fatigue. Negative for chills, diaphoresis, fever and weight loss.  HENT: Negative for nosebleeds and sore throat.   Eyes: Negative for double vision.  Respiratory: Positive for cough and shortness of breath. Negative for hemoptysis, sputum production and wheezing.   Cardiovascular: Negative for chest pain, palpitations, orthopnea and leg swelling.  Gastrointestinal: Negative for abdominal pain, blood in stool, constipation, diarrhea, heartburn, melena, nausea and vomiting.  Genitourinary: Negative for dysuria, frequency and urgency.  Musculoskeletal: Positive for back pain and joint pain.  Skin: Negative.  Negative for itching and rash.  Neurological: Negative for dizziness, tingling, focal weakness, weakness and headaches.  Endo/Heme/Allergies: Does not bruise/bleed easily.  Psychiatric/Behavioral: Negative for depression. The patient is not nervous/anxious and does not have insomnia.     MEDICAL HISTORY:  Past Medical History:  Diagnosis Date  . Arthritis   . Cancer ([place])   . Chronic kidney disease   . COPD (chronic obstructive pulmonary disease) ([place])   .  Diabetes mellitus without complication ([place])    type 2  . Hypertension   . Varicose veins of lower extremities with other complications     SURGICAL HISTORY: Past Surgical History:  Procedure Laterality Date  . ABDOMINAL HYSTERECTOMY  1972  . BLADDER SUSPENSION    . BREAST EXCISIONAL BIOPSY Right 1999   neg  . CHOLECYSTECTOMY  1975  . COLONOSCOPY    . COLONOSCOPY WITH PROPOFOL N/A 05/24/2015   Procedure: COLONOSCOPY WITH PROPOFOL;  Surgeon: Hulen Luster, MD;  Location: [facility] ENDOSCOPY;  Service: Gastroenterology;  Laterality: N/A;  . [place]  . ESOPHAGOGASTRODUODENOSCOPY N/A 05/24/2015   Procedure: ESOPHAGOGASTRODUODENOSCOPY (EGD);  Surgeon: Hulen Luster, MD;  Location: [facility] END[facility]COPY;  Service: Gastroenterology;  Laterality: N/A;  . FRACTURE SURGERY    . JOINT REPLACEMENT Right 2013   hip  . salpingo oophorectmy     . THYROIDECTOMY  1969  . WRIST FRACTURE SURGERY Left     SOCIAL HISTORY: Social History   Socioeconomic History  . Marital status: Widowed    Spouse name: Not on file  . Number of children: 2  . Years of education: 9th Grade  . Highest education level: 9th grade  Occupational History  . Occupation: Retired  Tobacco Use  . Smoking status: Former Smoker    Quit date: 07/18/1991    Years since quitting: 28.5  . Smokeless tobacco: Never Used  Vaping Use  . Vaping Use: Never used  Substance and Sexual Activity  . Alcohol use: No  . Drug use: No  . Sexual activity: Not on file  Other Topics Concern  . Not on file  Social History Narrative   In [place] in senior place; quit smoking [> 25 years ago]; no alcohol; hosiery.  Social Determinants of Health   Financial Resource Strain:   . Difficulty of Paying Living Expenses:   Food Insecurity:   . Worried About Charity fundraiser in the Last Year:   . Arboriculturist in the Last Year:   Transportation Needs:   . Film/video editor (Medical):   Marland Kitchen Lack of Transportation (Non-Medical):   Physical  Activity:   . Days of Exercise per Week:   . Minutes of Exercise per Session:   Stress:   . Feeling of Stress :   Social Connections:   . Frequency of Communication with Friends and Family:   . Frequency of Social Gatherings with Friends and Family:   . Attends Religious Services:   . Active Member of Clubs or Organizations:   . Attends Archivist Meetings:   Marland Kitchen Marital Status:   Intimate Partner Violence:   . Fear of Current or Ex-Partner:   . Emotionally Abused:   Marland Kitchen Physically Abused:   . Sexually Abused:     FAMILY HISTORY: Family History  Problem Relation Age of Onset  . Heart attack Father   . Asthma Father   . Kidney disease Mother   . Hypertension Mother   . Hypertension Other   . Diabetes Other   . Heart attack Other   . Breast cancer Neg Hx     ALLERGIES:  is allergic to alendronate.  MEDICATIONS:  Current Outpatient Medications  Medication Sig Dispense Refill  . amLODipine (NORVASC) 5 MG tablet TAKE ONE(1) TABLET EACH DAY 90 tablet 4  . aspirin EC 81 MG tablet Take 81 mg by mouth daily.     . Blood Glucose Monitoring Suppl (ONE TOUCH ULTRA 2) w/Device KIT Use to check blood sugar daily 1 each 0  . Cholecalciferol (VITAMIN D3) 50 MCG (2000 UT) TABS Take 1 tablet by mouth daily.    . dorzolamide (TRUSOPT) 2 % ophthalmic solution Place 1 drop into both eyes 2 (two) times daily.     . Ferrous Sulfate (IRON PO) Take by mouth.    . Fluticasone-Umeclidin-Vilant (TRELEGY ELLIPTA) 100-62.5-25 MCG/INH AEPB Inhale 1 puff into the lungs daily. TAKE IN PLACE OF SPIRIVA 2 each 0  . glipiZIDE-metformin (METAGLIP) 2.5-500 MG tablet TAKE ONE TABLET EVERY MORNING 90 tablet 4  . glucose blood (ONETOUCH ULTRA) test strip USE TO TEST BLOOD SUGAR TWICE DAILY 100 strip 4  . ibandronate (BONIVA) 150 MG tablet TAKE ONE TABLET ONCE A MONTH FIRST THING IN THE MORNING AT LEAST 1 HOUR BEFORE EATING, TAKE WITH WATER. 1 tablet 12  . Ibuprofen-diphenhydrAMINE Cit (ADVIL PM) 200-38  MG TABS Take 1 tablet by mouth at bedtime as needed.    . Lancets (ONETOUCH ULTRASOFT) lancets Use as instructed to check blood sugar once a day 100 each 3  . latanoprost (XALATAN) 0.005 % ophthalmic solution Place 1 drop into both eyes at bedtime.     Marland Kitchen levothyroxine (SYNTHROID) 100 MCG tablet TAKE 1 TABLET BY MOUTH EVERY DAY 90 tablet 3  . loperamide (IMODIUM) 2 MG capsule Take 2 mg by mouth as needed for diarrhea or loose stools.    . lovastatin (MEVACOR) 40 MG tablet TAKE 1 TABLET BY MOUTH AT BEDTIME 90 tablet 0  . montelukast (SINGULAIR) 10 MG tablet Take 1 tablet (10 mg total) by mouth daily. For chronic cough 30 tablet 2  . naproxen (NAPROSYN) 375 MG tablet Take 1 tablet (375 mg total) by mouth daily as needed for mild pain. [place]  tablet 2  . nortriptyline (PAMELOR) 25 MG capsule TAKE 1 CAPSULE(25 MG) BY MOUTH AT BEDTIME 90 capsule 0  . omeprazole (PRILOSEC) 40 MG capsule Take 1 capsule (40 mg total) by mouth daily. 90 capsule 4  . Polyethylene Glycol 3350 (DULCOLAX BALANCE PO) Take 2 tablets by mouth as needed.    Marland Kitchen SPIRIVA RESPIMAT 2.5 MCG/ACT AERS SMARTSIG:2 Puff(s) By Mouth Daily    . traMADol (ULTRAM) 50 MG tablet TAKE ONE TABLET EVERY 8 HOUR AS NEEDED FOR PAIN 90 tablet 1  . valsartan-hydrochlorothiazide (DIOVAN-HCT) 160-25 MG tablet Take 1 tablet by mouth daily. 90 tablet 4   No current facility-administered medications for this visit.      PHYSICAL EXAMINATION:   Vitals:   01/14/20 1301  BP: (!) 160/66  Pulse: 80  Resp: 16  Temp: 98.4 F (36.9 C)  SpO2: 99%   Filed Weights   01/14/20 1301  Weight: 120 lb (54.4 kg)    Physical Exam Constitutional:      Comments: Patient is accompanied by daughter.  HENT:     Head: Normocephalic and atraumatic.     Mouth/Throat:     Pharynx: No oropharyngeal exudate.  Eyes:     Pupils: Pupils are equal, round, and reactive to light.  Cardiovascular:     Rate and Rhythm: Normal rate and regular rhythm.  Pulmonary:     Effort:  No respiratory distress.     Breath sounds: No wheezing.  Abdominal:     General: Bowel sounds are normal. There is no distension.     Palpations: Abdomen is soft. There is no mass.     Tenderness: There is no abdominal tenderness. There is no guarding or rebound.  Musculoskeletal:        General: No tenderness. Normal range of motion.     Cervical back: Normal range of motion and neck supple.  Skin:    General: Skin is warm.  Neurological:     Mental Status: She is alert and oriented to person, place, and time.  Psychiatric:        Mood and Affect: Affect normal.     LABORATORY DATA:  I have reviewed the data as listed Lab Results  Component Value Date   WBC 7.3 01/14/2020   HGB 9.9 (L) 01/14/2020   HCT 32.3 (L) 01/14/2020   MCV 100.0 01/14/2020   PLT 184 01/14/2020   Recent Labs    09/26/19 1204 11/10/19 1238 01/14/20 1244  NA 141 139 141  K 4.4 4.1 4.4  CL 107 103 107  CO2 '23 25 26  ' GLUCOSE 102* 157* 86  BUN 28* 26* 20  CREATININE 1.34* 1.21* 1.16*  CALCIUM 8.7* 9.1 9.0  GFRNONAA 35* 40* 42*  GFRAA 41* 46* 49*  PROT 7.7  --  7.6  ALBUMIN 4.1  --  4.1  AST 17  --  15  ALT 10  --  13  ALKPHOS 64  --  56  BILITOT 0.6  --  0.5     No results found.  Anemia due to stage 3b chronic kidney disease #Anemia-secondary chronic kidney disease stage III/iron deficiency [hb-8-9]. Iron sat- 20 &; ferritin-68.  Status post IV iron infusion hemoglobin- 9.9.  Proceed with  Retacrit; again reviewed the potential side effects of Retacrit.  Also understand that she went will need intermittent IV iron infusions.  #Frequency of Retacrit/iron infusions will depend upon her response to therapy/hemoglobin levels.  #Above plan was discussed with patient and daughter in  detail; in agreement.  DISPOSITION:  # Retacrit today # monthly cbc/possible retacrit # # Follow up in 3 month- MD ;labs-cbc/bmp;possible SQ retacrit; Dr.B  All questions were answered. The patient knows to  call the clinic with any problems, questions or concerns.    Cammie Sickle, MD 01/14/2020 1:56 PM

## 2020-01-14 NOTE — Assessment & Plan Note (Addendum)
#  Anemia-secondary chronic kidney disease stage III/iron deficiency [hb-8-9]. Iron sat- 20 &; ferritin-68.  Status post IV iron infusion hemoglobin- 9.9.  Proceed with  Retacrit; again reviewed the potential side effects of Retacrit.  Also understand that she went will need intermittent IV iron infusions.  #Frequency of Retacrit/iron infusions will depend upon her response to therapy/hemoglobin levels.  #Above plan was discussed with patient and daughter in detail; in agreement.  DISPOSITION:  # Retacrit today # monthly cbc/possible retacrit # # Follow up in 3 month- MD ;labs-cbc/bmp;possible SQ retacrit; Dr.B

## 2020-02-11 ENCOUNTER — Inpatient Hospital Stay: Payer: PPO | Attending: Internal Medicine

## 2020-02-11 ENCOUNTER — Inpatient Hospital Stay: Payer: PPO

## 2020-02-11 ENCOUNTER — Other Ambulatory Visit: Payer: Self-pay

## 2020-02-11 DIAGNOSIS — N1832 Chronic kidney disease, stage 3b: Secondary | ICD-10-CM

## 2020-02-11 DIAGNOSIS — N183 Chronic kidney disease, stage 3 unspecified: Secondary | ICD-10-CM | POA: Diagnosis not present

## 2020-02-11 DIAGNOSIS — D631 Anemia in chronic kidney disease: Secondary | ICD-10-CM | POA: Insufficient documentation

## 2020-02-11 LAB — CBC WITH DIFFERENTIAL/PLATELET
Abs Immature Granulocytes: 0.02 10*3/uL (ref 0.00–0.07)
Basophils Absolute: 0 10*3/uL (ref 0.0–0.1)
Basophils Relative: 0 %
Eosinophils Absolute: 0.1 10*3/uL (ref 0.0–0.5)
Eosinophils Relative: 1 %
HCT: 32.9 % — ABNORMAL LOW (ref 36.0–46.0)
Hemoglobin: 10.2 g/dL — ABNORMAL LOW (ref 12.0–15.0)
Immature Granulocytes: 0 %
Lymphocytes Relative: 29 %
Lymphs Abs: 1.9 10*3/uL (ref 0.7–4.0)
MCH: 30.6 pg (ref 26.0–34.0)
MCHC: 31 g/dL (ref 30.0–36.0)
MCV: 98.8 fL (ref 80.0–100.0)
Monocytes Absolute: 0.5 10*3/uL (ref 0.1–1.0)
Monocytes Relative: 9 %
Neutro Abs: 3.9 10*3/uL (ref 1.7–7.7)
Neutrophils Relative %: 61 %
Platelets: 227 10*3/uL (ref 150–400)
RBC: 3.33 MIL/uL — ABNORMAL LOW (ref 3.87–5.11)
RDW: 12.3 % (ref 11.5–15.5)
WBC: 6.4 10*3/uL (ref 4.0–10.5)
nRBC: 0 % (ref 0.0–0.2)

## 2020-02-11 NOTE — Progress Notes (Signed)
hgb 10.2. injection held today.

## 2020-02-28 ENCOUNTER — Other Ambulatory Visit: Payer: Self-pay | Admitting: Family Medicine

## 2020-03-08 ENCOUNTER — Ambulatory Visit: Payer: PPO

## 2020-03-10 ENCOUNTER — Other Ambulatory Visit: Payer: Self-pay

## 2020-03-10 ENCOUNTER — Inpatient Hospital Stay: Payer: PPO

## 2020-03-10 ENCOUNTER — Inpatient Hospital Stay: Payer: PPO | Attending: Internal Medicine

## 2020-03-10 DIAGNOSIS — E119 Type 2 diabetes mellitus without complications: Secondary | ICD-10-CM | POA: Insufficient documentation

## 2020-03-10 DIAGNOSIS — N183 Chronic kidney disease, stage 3 unspecified: Secondary | ICD-10-CM | POA: Diagnosis not present

## 2020-03-10 DIAGNOSIS — Z79899 Other long term (current) drug therapy: Secondary | ICD-10-CM | POA: Diagnosis not present

## 2020-03-10 DIAGNOSIS — Z7982 Long term (current) use of aspirin: Secondary | ICD-10-CM | POA: Diagnosis not present

## 2020-03-10 DIAGNOSIS — Z87891 Personal history of nicotine dependence: Secondary | ICD-10-CM | POA: Diagnosis not present

## 2020-03-10 DIAGNOSIS — D631 Anemia in chronic kidney disease: Secondary | ICD-10-CM | POA: Insufficient documentation

## 2020-03-10 DIAGNOSIS — I10 Essential (primary) hypertension: Secondary | ICD-10-CM | POA: Insufficient documentation

## 2020-03-10 DIAGNOSIS — N1832 Chronic kidney disease, stage 3b: Secondary | ICD-10-CM

## 2020-03-10 LAB — CBC WITH DIFFERENTIAL/PLATELET
Abs Immature Granulocytes: 0.02 10*3/uL (ref 0.00–0.07)
Basophils Absolute: 0 10*3/uL (ref 0.0–0.1)
Basophils Relative: 0 %
Eosinophils Absolute: 0.1 10*3/uL (ref 0.0–0.5)
Eosinophils Relative: 1 %
HCT: 32 % — ABNORMAL LOW (ref 36.0–46.0)
Hemoglobin: 10 g/dL — ABNORMAL LOW (ref 12.0–15.0)
Immature Granulocytes: 0 %
Lymphocytes Relative: 32 %
Lymphs Abs: 2 10*3/uL (ref 0.7–4.0)
MCH: 30.8 pg (ref 26.0–34.0)
MCHC: 31.3 g/dL (ref 30.0–36.0)
MCV: 98.5 fL (ref 80.0–100.0)
Monocytes Absolute: 0.5 10*3/uL (ref 0.1–1.0)
Monocytes Relative: 7 %
Neutro Abs: 3.7 10*3/uL (ref 1.7–7.7)
Neutrophils Relative %: 60 %
Platelets: 217 10*3/uL (ref 150–400)
RBC: 3.25 MIL/uL — ABNORMAL LOW (ref 3.87–5.11)
RDW: 12.7 % (ref 11.5–15.5)
WBC: 6.3 10*3/uL (ref 4.0–10.5)
nRBC: 0 % (ref 0.0–0.2)

## 2020-04-14 ENCOUNTER — Inpatient Hospital Stay: Payer: PPO

## 2020-04-14 ENCOUNTER — Inpatient Hospital Stay: Payer: PPO | Attending: Internal Medicine

## 2020-04-14 ENCOUNTER — Inpatient Hospital Stay (HOSPITAL_BASED_OUTPATIENT_CLINIC_OR_DEPARTMENT_OTHER): Payer: PPO | Admitting: Internal Medicine

## 2020-04-14 ENCOUNTER — Other Ambulatory Visit: Payer: Self-pay

## 2020-04-14 ENCOUNTER — Encounter: Payer: Self-pay | Admitting: Internal Medicine

## 2020-04-14 DIAGNOSIS — D631 Anemia in chronic kidney disease: Secondary | ICD-10-CM | POA: Insufficient documentation

## 2020-04-14 DIAGNOSIS — N1832 Chronic kidney disease, stage 3b: Secondary | ICD-10-CM | POA: Diagnosis not present

## 2020-04-14 LAB — CBC WITH DIFFERENTIAL/PLATELET
Abs Immature Granulocytes: 0.03 10*3/uL (ref 0.00–0.07)
Basophils Absolute: 0 10*3/uL (ref 0.0–0.1)
Basophils Relative: 1 %
Eosinophils Absolute: 0.1 10*3/uL (ref 0.0–0.5)
Eosinophils Relative: 1 %
HCT: 29.9 % — ABNORMAL LOW (ref 36.0–46.0)
Hemoglobin: 9.4 g/dL — ABNORMAL LOW (ref 12.0–15.0)
Immature Granulocytes: 1 %
Lymphocytes Relative: 28 %
Lymphs Abs: 1.5 10*3/uL (ref 0.7–4.0)
MCH: 30.5 pg (ref 26.0–34.0)
MCHC: 31.4 g/dL (ref 30.0–36.0)
MCV: 97.1 fL (ref 80.0–100.0)
Monocytes Absolute: 0.8 10*3/uL (ref 0.1–1.0)
Monocytes Relative: 15 %
Neutro Abs: 2.9 10*3/uL (ref 1.7–7.7)
Neutrophils Relative %: 54 %
Platelets: 196 10*3/uL (ref 150–400)
RBC: 3.08 MIL/uL — ABNORMAL LOW (ref 3.87–5.11)
RDW: 13.1 % (ref 11.5–15.5)
WBC: 5.3 10*3/uL (ref 4.0–10.5)
nRBC: 0 % (ref 0.0–0.2)

## 2020-04-14 LAB — BASIC METABOLIC PANEL
Anion gap: 10 (ref 5–15)
BUN: 29 mg/dL — ABNORMAL HIGH (ref 8–23)
CO2: 26 mmol/L (ref 22–32)
Calcium: 8.4 mg/dL — ABNORMAL LOW (ref 8.9–10.3)
Chloride: 105 mmol/L (ref 98–111)
Creatinine, Ser: 1.33 mg/dL — ABNORMAL HIGH (ref 0.44–1.00)
GFR calc Af Amer: 41 mL/min — ABNORMAL LOW (ref >60)
GFR calc non Af Amer: 35 mL/min — ABNORMAL LOW (ref >60)
Glucose, Bld: 102 mg/dL — ABNORMAL HIGH (ref 70–99)
Potassium: 4 mmol/L (ref 3.5–5.1)
Sodium: 141 mmol/L (ref 135–145)

## 2020-04-14 MED ORDER — EPOETIN ALFA-EPBX 10000 UNIT/ML IJ SOLN
20000.0000 [IU] | Freq: Once | INTRAMUSCULAR | Status: AC
  Administered 2020-04-14: 20000 [IU] via SUBCUTANEOUS
  Filled 2020-04-14: qty 2

## 2020-04-14 NOTE — Progress Notes (Signed)
Pt in for follow up, states is taking iron but not in the last week.  Daughter suppose to meet pt for appt but has not arrived yet.  Pt denies any concerns today.

## 2020-04-14 NOTE — Assessment & Plan Note (Addendum)
#  Anemia-secondary chronic kidney disease stage III/iron deficiency [March 2021-hb-8-9]. Iron sat- 20 &; ferritin-68. Currently on IV iron infusion/Retacrit. STABLE  # Today hb- 9.4; proceed with retacrit.; again reviwed the need for ongoing retacrit for the rest of her life.   DISPOSITION:  # Retacrit today # monthly cbc/possible retacrit x2 # # Follow up 2nd week of Jan 2022-- MD ;labs-cbc/bmp;iron studies/ferritin; possible SQ retacrit; Dr.B  Addendum: I tried to reach patient's daughter Roberto Scales phone #[phone]; unable to leave a voicemail/no voicemail set up

## 2020-04-14 NOTE — Progress Notes (Signed)
[place] NOTE  Patient Care Team: Kirk Ruths, MD as PCP - General (Internal Medicine) Dingeldein, Remo Lipps, MD as Consulting Physician (Ophthalmology) Cammie Sickle, MD as Consulting Physician (Hematology and Oncology)  CHIEF COMPLAINTS/PURPOSE OF CONSULTATION:    [place] CKD-III Robyn Butler 2021- Hb 8-9;[name];  Ferritin-100; PCP] [EGD/; colonoscopy-[facility];2016 ; capsule-]; iron saturation 20% ferritin-68; no hemolysis;   # CKD-III [GFR40s]  # COPD   HISTORY OF PRESENTING ILLNESS:  Robyn Butler 86 y.o.  female anemia/chronic kidney disease is here for follow-up.  Patient continues to notice improvement of energy levels. Denies any chest pain. Intermittent shortness of breath on exertion.   Review of Systems  Constitutional: Positive for malaise/fatigue. Negative for chills, diaphoresis, fever and weight loss.  HENT: Negative for nosebleeds and sore throat.   Eyes: Negative for double vision.  Respiratory: Positive for cough and shortness of breath. Negative for hemoptysis, sputum production and wheezing.   Cardiovascular: Negative for chest pain, palpitations, orthopnea and leg swelling.  Gastrointestinal: Negative for abdominal pain, blood in stool, constipation, diarrhea, heartburn, melena, nausea and vomiting.  Genitourinary: Negative for dysuria, frequency and urgency.  Musculoskeletal: Positive for back pain and joint pain.  Skin: Negative.  Negative for itching and rash.  Neurological: Negative for dizziness, tingling, focal weakness, weakness and headaches.  Endo/Heme/Allergies: Does not bruise/bleed easily.  Psychiatric/Behavioral: Negative for depression. The patient is not nervous/anxious and does not have insomnia.     MEDICAL HISTORY:  Past Medical History:  Diagnosis Date  . Arthritis   . Cancer ([place])   . Chronic kidney disease   . COPD (chronic obstructive pulmonary disease) ([place])   .  Diabetes mellitus without complication ([place])    type 2  . Hypertension   . Varicose veins of lower extremities with other complications     SURGICAL HISTORY: Past Surgical History:  Procedure Laterality Date  . ABDOMINAL HYSTERECTOMY  1972  . BLADDER SUSPENSION    . BREAST EXCISIONAL BIOPSY Right 1999   neg  . CHOLECYSTECTOMY  1975  . COLONOSCOPY    . COLONOSCOPY WITH PROPOFOL N/A 05/24/2015   Procedure: COLONOSCOPY WITH PROPOFOL;  Surgeon: Hulen Luster, MD;  Location: [facility] ENDOSCOPY;  Service: Gastroenterology;  Laterality: N/A;  . [place]  . ESOPHAGOGASTRODUODENOSCOPY N/A 05/24/2015   Procedure: ESOPHAGOGASTRODUODENOSCOPY (EGD);  Surgeon: Hulen Luster, MD;  Location: [facility] ENDOSCOPY;  Service: Gastroenterology;  Laterality: N/A;  . FRACTURE SURGERY    . JOINT REPLACEMENT Right 2013   hip  . salpingo oophorectmy     . THYROIDECTOMY  1969  . WRIST FRACTURE SURGERY Left     SOCIAL HISTORY: Social History   Socioeconomic History  . Marital status: Widowed    Spouse name: Not on file  . Number of children: 2  . Years of education: 9th Grade  . Highest education level: 9th grade  Occupational History  . Occupation: Retired  Tobacco Use  . Smoking status: Former Smoker    Quit date: 07/18/1991    Years since quitting: 28.7  . Smokeless tobacco: Never Used  Vaping Use  . Vaping Use: Never used  Substance and Sexual Activity  . Alcohol use: No  . Drug use: No  . Sexual activity: Not on file  Other Topics Concern  . Not on file  Social History Narrative   In [place] in senior place; quit smoking [> 25 years ago]; no alcohol; hosiery.    Social Determinants of Health  Financial Resource Strain:   . Difficulty of Paying Living Expenses: Not on file  Food Insecurity:   . Worried About Charity fundraiser in the Last Year: Not on file  . Ran Out of Food in the Last Year: Not on file  Transportation Needs:   . Lack of Transportation (Medical): Not on file  . Lack  of Transportation (Non-Medical): Not on file  Physical Activity:   . Days of Exercise per Week: Not on file  . Minutes of Exercise per Session: Not on file  Stress:   . Feeling of Stress : Not on file  Social Connections:   . Frequency of Communication with Friends and Family: Not on file  . Frequency of Social Gatherings with Friends and Family: Not on file  . Attends Religious Services: Not on file  . Active Member of Clubs or Organizations: Not on file  . Attends Archivist Meetings: Not on file  . Marital Status: Not on file  Intimate Partner Violence:   . Fear of Current or Ex-Partner: Not on file  . Emotionally Abused: Not on file  . Physically Abused: Not on file  . Sexually Abused: Not on file    FAMILY HISTORY: Family History  Problem Relation Age of Onset  . Heart attack Father   . Asthma Father   . Kidney disease Mother   . Hypertension Mother   . Hypertension Other   . Diabetes Other   . Heart attack Other   . Breast cancer Neg Hx     ALLERGIES:  is allergic to alendronate.  MEDICATIONS:  Current Outpatient Medications  Medication Sig Dispense Refill  . amLODipine (NORVASC) 5 MG tablet TAKE ONE(1) TABLET EACH DAY 90 tablet 4  . aspirin EC 81 MG tablet Take 81 mg by mouth daily.     . Blood Glucose Monitoring Suppl (ONE TOUCH ULTRA 2) w/Device KIT Use to check blood sugar daily 1 each 0  . Cholecalciferol (VITAMIN D3) 50 MCG (2000 UT) TABS Take 1 tablet by mouth daily.    . dorzolamide (TRUSOPT) 2 % ophthalmic solution Place 1 drop into both eyes 2 (two) times daily.     . Ferrous Sulfate (IRON PO) Take by mouth.    . ferrous sulfate 324 MG TBEC Take 324 mg by mouth.    . Fluticasone-Umeclidin-Vilant (TRELEGY ELLIPTA) 100-62.5-25 MCG/INH AEPB Inhale 1 puff into the lungs daily. TAKE IN PLACE OF SPIRIVA 2 each 0  . glipiZIDE-metformin (METAGLIP) 2.5-500 MG tablet TAKE ONE TABLET EVERY MORNING 90 tablet 4  . glucose blood (ONETOUCH ULTRA) test strip  USE TO TEST BLOOD SUGAR TWICE DAILY 100 strip 4  . ibandronate (BONIVA) 150 MG tablet TAKE ONE TABLET ONCE A MONTH FIRST THING IN THE MORNING AT LEAST 1 HOUR BEFORE EATING, TAKE WITH WATER. 1 tablet 12  . Ibuprofen-diphenhydrAMINE Cit (ADVIL PM) 200-38 MG TABS Take 1 tablet by mouth at bedtime as needed.    . Lancets (ONETOUCH ULTRASOFT) lancets Use as instructed to check blood sugar once a day 100 each 3  . latanoprost (XALATAN) 0.005 % ophthalmic solution Place 1 drop into both eyes at bedtime.     Marland Kitchen levothyroxine (SYNTHROID) 100 MCG tablet TAKE 1 TABLET BY MOUTH EVERY DAY 90 tablet 3  . loperamide (IMODIUM) 2 MG capsule Take 2 mg by mouth as needed for diarrhea or loose stools.    . lovastatin (MEVACOR) 40 MG tablet TAKE 1 TABLET BY MOUTH AT BEDTIME 90 tablet 0  .  montelukast (SINGULAIR) 10 MG tablet Take 1 tablet (10 mg total) by mouth daily. For chronic cough 30 tablet 2  . naproxen (NAPROSYN) 375 MG tablet Take 1 tablet (375 mg total) by mouth daily as needed for mild pain. 90 tablet 2  . nortriptyline (PAMELOR) 25 MG capsule TAKE 1 CAPSULE(25 MG) BY MOUTH AT BEDTIME 90 capsule 0  . omeprazole (PRILOSEC) 40 MG capsule Take 1 capsule (40 mg total) by mouth daily. 90 capsule 4  . Polyethylene Glycol 3350 (DULCOLAX BALANCE PO) Take 2 tablets by mouth as needed.    Marland Kitchen SPIRIVA RESPIMAT 2.5 MCG/ACT AERS SMARTSIG:2 Puff(s) By Mouth Daily    . traMADol (ULTRAM) 50 MG tablet TAKE ONE TABLET EVERY 8 HOUR AS NEEDED FOR PAIN 90 tablet 1  . valsartan-hydrochlorothiazide (DIOVAN-HCT) 160-25 MG tablet Take 1 tablet by mouth daily. 90 tablet 4   No current facility-administered medications for this visit.      PHYSICAL EXAMINATION:   Vitals:   04/14/20 1347  BP: (!) 155/62  Pulse: 69  Resp: 18  Temp: 98.3 F (36.8 C)  SpO2: 98%   Filed Weights   04/14/20 1347  Weight: 123 lb 3.2 oz (55.9 kg)    Physical Exam Constitutional:      Comments: Patient is accompanied by daughter.  HENT:      Head: Normocephalic and atraumatic.     Mouth/Throat:     Pharynx: No oropharyngeal exudate.  Eyes:     Pupils: Pupils are equal, round, and reactive to light.  Cardiovascular:     Rate and Rhythm: Normal rate and regular rhythm.  Pulmonary:     Effort: No respiratory distress.     Breath sounds: No wheezing.  Abdominal:     General: Bowel sounds are normal. There is no distension.     Palpations: Abdomen is soft. There is no mass.     Tenderness: There is no abdominal tenderness. There is no guarding or rebound.  Musculoskeletal:        General: No tenderness. Normal range of motion.     Cervical back: Normal range of motion and neck supple.  Skin:    General: Skin is warm.  Neurological:     Mental Status: She is alert and oriented to person, place, and time.  Psychiatric:        Mood and Affect: Affect normal.     LABORATORY DATA:  I have reviewed the data as listed Lab Results  Component Value Date   WBC 5.3 04/14/2020   HGB 9.4 (L) 04/14/2020   HCT 29.9 (L) 04/14/2020   MCV 97.1 04/14/2020   PLT 196 04/14/2020   Recent Labs    09/26/19 1204 09/26/19 1204 11/10/19 1238 01/14/20 1244 04/14/20 1306  NA 141   < > 139 141 141  K 4.4   < > 4.1 4.4 4.0  CL 107   < > 103 107 105  CO2 23   < > '25 26 26  ' GLUCOSE 102*   < > 157* 86 102*  BUN 28*   < > 26* 20 29*  CREATININE 1.34*   < > 1.21* 1.16* 1.33*  CALCIUM 8.7*   < > 9.1 9.0 8.4*  GFRNONAA 35*   < > 40* 42* 35*  GFRAA 41*   < > 46* 49* 41*  PROT 7.7  --   --  7.6  --   ALBUMIN 4.1  --   --  4.1  --   AST 17  --   --  15  --   ALT 10  --   --  13  --   ALKPHOS 64  --   --  56  --   BILITOT 0.6  --   --  0.5  --    < > = values in this interval not displayed.     No results found.  Anemia due to stage 3b chronic kidney disease #Anemia-secondary chronic kidney disease stage III/iron deficiency [March 2021-hb-8-9]. Iron sat- 20 &; ferritin-68. Currently on IV iron infusion/Retacrit. STABLE  # Today hb-  9.4; proceed with retacrit.; again reviwed the need for ongoing retacrit for the rest of her life.   DISPOSITION:  # Retacrit today # monthly cbc/possible retacrit x2 # # Follow up 2nd week of Jan 2022-- MD ;labs-cbc/bmp;iron studies/ferritin; possible SQ retacrit; Dr.B  Addendum: I tried to reach patient's daughter Roberto Scales phone #[phone]; unable to leave a voicemail/no voicemail set up  All questions were answered. The patient knows to call the clinic with any problems, questions or concerns.    Cammie Sickle, MD 04/16/2020 4:19 PM

## 2020-04-19 DIAGNOSIS — N183 Chronic kidney disease, stage 3 unspecified: Secondary | ICD-10-CM | POA: Diagnosis not present

## 2020-04-19 DIAGNOSIS — E1122 Type 2 diabetes mellitus with diabetic chronic kidney disease: Secondary | ICD-10-CM | POA: Diagnosis not present

## 2020-04-19 DIAGNOSIS — E118 Type 2 diabetes mellitus with unspecified complications: Secondary | ICD-10-CM | POA: Diagnosis not present

## 2020-04-19 DIAGNOSIS — E039 Hypothyroidism, unspecified: Secondary | ICD-10-CM | POA: Diagnosis not present

## 2020-04-26 DIAGNOSIS — E7800 Pure hypercholesterolemia, unspecified: Secondary | ICD-10-CM | POA: Diagnosis not present

## 2020-04-26 DIAGNOSIS — I10 Essential (primary) hypertension: Secondary | ICD-10-CM | POA: Diagnosis not present

## 2020-04-26 DIAGNOSIS — N183 Chronic kidney disease, stage 3 unspecified: Secondary | ICD-10-CM | POA: Diagnosis not present

## 2020-04-26 DIAGNOSIS — J449 Chronic obstructive pulmonary disease, unspecified: Secondary | ICD-10-CM | POA: Diagnosis not present

## 2020-04-26 DIAGNOSIS — Z23 Encounter for immunization: Secondary | ICD-10-CM | POA: Diagnosis not present

## 2020-04-26 DIAGNOSIS — Z0000 Encounter for general adult medical examination without abnormal findings: Secondary | ICD-10-CM | POA: Diagnosis not present

## 2020-04-26 DIAGNOSIS — E039 Hypothyroidism, unspecified: Secondary | ICD-10-CM | POA: Diagnosis not present

## 2020-04-26 DIAGNOSIS — E1122 Type 2 diabetes mellitus with diabetic chronic kidney disease: Secondary | ICD-10-CM | POA: Diagnosis not present

## 2020-04-29 ENCOUNTER — Other Ambulatory Visit: Payer: Self-pay | Admitting: Family Medicine

## 2020-04-29 DIAGNOSIS — E118 Type 2 diabetes mellitus with unspecified complications: Secondary | ICD-10-CM

## 2020-05-03 NOTE — Telephone Encounter (Signed)
Sent to wrong address

## 2020-05-04 ENCOUNTER — Other Ambulatory Visit: Payer: Self-pay | Admitting: Family Medicine

## 2020-05-04 DIAGNOSIS — E118 Type 2 diabetes mellitus with unspecified complications: Secondary | ICD-10-CM

## 2020-05-12 ENCOUNTER — Inpatient Hospital Stay: Payer: PPO | Attending: Internal Medicine

## 2020-05-12 ENCOUNTER — Inpatient Hospital Stay: Payer: PPO

## 2020-05-12 ENCOUNTER — Other Ambulatory Visit: Payer: Self-pay

## 2020-05-12 VITALS — BP 160/65 | HR 79

## 2020-05-12 DIAGNOSIS — D631 Anemia in chronic kidney disease: Secondary | ICD-10-CM | POA: Insufficient documentation

## 2020-05-12 DIAGNOSIS — N1832 Chronic kidney disease, stage 3b: Secondary | ICD-10-CM

## 2020-05-12 DIAGNOSIS — N183 Chronic kidney disease, stage 3 unspecified: Secondary | ICD-10-CM | POA: Diagnosis not present

## 2020-05-12 LAB — CBC WITH DIFFERENTIAL/PLATELET
Abs Immature Granulocytes: 0.03 10*3/uL (ref 0.00–0.07)
Basophils Absolute: 0 10*3/uL (ref 0.0–0.1)
Basophils Relative: 0 %
Eosinophils Absolute: 0.1 10*3/uL (ref 0.0–0.5)
Eosinophils Relative: 1 %
HCT: 31.9 % — ABNORMAL LOW (ref 36.0–46.0)
Hemoglobin: 9.9 g/dL — ABNORMAL LOW (ref 12.0–15.0)
Immature Granulocytes: 1 %
Lymphocytes Relative: 30 %
Lymphs Abs: 2 10*3/uL (ref 0.7–4.0)
MCH: 30.7 pg (ref 26.0–34.0)
MCHC: 31 g/dL (ref 30.0–36.0)
MCV: 99.1 fL (ref 80.0–100.0)
Monocytes Absolute: 0.5 10*3/uL (ref 0.1–1.0)
Monocytes Relative: 7 %
Neutro Abs: 4 10*3/uL (ref 1.7–7.7)
Neutrophils Relative %: 61 %
Platelets: 205 10*3/uL (ref 150–400)
RBC: 3.22 MIL/uL — ABNORMAL LOW (ref 3.87–5.11)
RDW: 13 % (ref 11.5–15.5)
WBC: 6.5 10*3/uL (ref 4.0–10.5)
nRBC: 0 % (ref 0.0–0.2)

## 2020-05-12 MED ORDER — EPOETIN ALFA-EPBX 10000 UNIT/ML IJ SOLN
20000.0000 [IU] | Freq: Once | INTRAMUSCULAR | Status: AC
  Administered 2020-05-12: 20000 [IU] via SUBCUTANEOUS
  Filled 2020-05-12: qty 2

## 2020-05-18 ENCOUNTER — Other Ambulatory Visit: Payer: Self-pay | Admitting: Family Medicine

## 2020-06-07 DIAGNOSIS — H401131 Primary open-angle glaucoma, bilateral, mild stage: Secondary | ICD-10-CM | POA: Diagnosis not present

## 2020-06-09 ENCOUNTER — Inpatient Hospital Stay: Payer: PPO | Attending: Internal Medicine

## 2020-06-09 ENCOUNTER — Inpatient Hospital Stay: Payer: PPO

## 2020-06-09 ENCOUNTER — Other Ambulatory Visit: Payer: Self-pay

## 2020-06-09 DIAGNOSIS — N1832 Chronic kidney disease, stage 3b: Secondary | ICD-10-CM | POA: Insufficient documentation

## 2020-06-09 DIAGNOSIS — D631 Anemia in chronic kidney disease: Secondary | ICD-10-CM | POA: Insufficient documentation

## 2020-06-09 LAB — CBC WITH DIFFERENTIAL/PLATELET
Abs Immature Granulocytes: 0.02 10*3/uL (ref 0.00–0.07)
Basophils Absolute: 0 10*3/uL (ref 0.0–0.1)
Basophils Relative: 0 %
Eosinophils Absolute: 0 10*3/uL (ref 0.0–0.5)
Eosinophils Relative: 1 %
HCT: 35.7 % — ABNORMAL LOW (ref 36.0–46.0)
Hemoglobin: 11.2 g/dL — ABNORMAL LOW (ref 12.0–15.0)
Immature Granulocytes: 0 %
Lymphocytes Relative: 23 %
Lymphs Abs: 1.7 10*3/uL (ref 0.7–4.0)
MCH: 31.2 pg (ref 26.0–34.0)
MCHC: 31.4 g/dL (ref 30.0–36.0)
MCV: 99.4 fL (ref 80.0–100.0)
Monocytes Absolute: 0.6 10*3/uL (ref 0.1–1.0)
Monocytes Relative: 8 %
Neutro Abs: 5.1 10*3/uL (ref 1.7–7.7)
Neutrophils Relative %: 68 %
Platelets: 207 10*3/uL (ref 150–400)
RBC: 3.59 MIL/uL — ABNORMAL LOW (ref 3.87–5.11)
RDW: 12.6 % (ref 11.5–15.5)
WBC: 7.5 10*3/uL (ref 4.0–10.5)
nRBC: 0 % (ref 0.0–0.2)

## 2020-06-21 DIAGNOSIS — H35371 Puckering of macula, right eye: Secondary | ICD-10-CM | POA: Diagnosis not present

## 2020-06-21 DIAGNOSIS — H401131 Primary open-angle glaucoma, bilateral, mild stage: Secondary | ICD-10-CM | POA: Diagnosis not present

## 2020-07-28 ENCOUNTER — Inpatient Hospital Stay: Payer: PPO | Attending: Internal Medicine

## 2020-07-28 ENCOUNTER — Inpatient Hospital Stay: Payer: PPO

## 2020-07-28 ENCOUNTER — Inpatient Hospital Stay (HOSPITAL_BASED_OUTPATIENT_CLINIC_OR_DEPARTMENT_OTHER): Payer: PPO | Admitting: Internal Medicine

## 2020-07-28 VITALS — BP 164/73 | HR 81 | Temp 98.5°F | Resp 18 | Wt 115.0 lb

## 2020-07-28 DIAGNOSIS — D631 Anemia in chronic kidney disease: Secondary | ICD-10-CM | POA: Insufficient documentation

## 2020-07-28 DIAGNOSIS — N183 Chronic kidney disease, stage 3 unspecified: Secondary | ICD-10-CM | POA: Insufficient documentation

## 2020-07-28 DIAGNOSIS — N1832 Chronic kidney disease, stage 3b: Secondary | ICD-10-CM

## 2020-07-28 LAB — IRON AND TIBC
Iron: 65 ug/dL (ref 28–170)
Saturation Ratios: 30 % (ref 10.4–31.8)
TIBC: 214 ug/dL — ABNORMAL LOW (ref 250–450)
UIBC: 149 ug/dL

## 2020-07-28 LAB — CBC WITH DIFFERENTIAL/PLATELET
Abs Immature Granulocytes: 0.02 10*3/uL (ref 0.00–0.07)
Basophils Absolute: 0 10*3/uL (ref 0.0–0.1)
Basophils Relative: 0 %
Eosinophils Absolute: 0.1 10*3/uL (ref 0.0–0.5)
Eosinophils Relative: 1 %
HCT: 31 % — ABNORMAL LOW (ref 36.0–46.0)
Hemoglobin: 9.8 g/dL — ABNORMAL LOW (ref 12.0–15.0)
Immature Granulocytes: 0 %
Lymphocytes Relative: 25 %
Lymphs Abs: 1.2 10*3/uL (ref 0.7–4.0)
MCH: 31 pg (ref 26.0–34.0)
MCHC: 31.6 g/dL (ref 30.0–36.0)
MCV: 98.1 fL (ref 80.0–100.0)
Monocytes Absolute: 0.4 10*3/uL (ref 0.1–1.0)
Monocytes Relative: 7 %
Neutro Abs: 3.3 10*3/uL (ref 1.7–7.7)
Neutrophils Relative %: 67 %
Platelets: 306 10*3/uL (ref 150–400)
RBC: 3.16 MIL/uL — ABNORMAL LOW (ref 3.87–5.11)
RDW: 12.8 % (ref 11.5–15.5)
WBC: 5 10*3/uL (ref 4.0–10.5)
nRBC: 0 % (ref 0.0–0.2)

## 2020-07-28 LAB — BASIC METABOLIC PANEL
Anion gap: 10 (ref 5–15)
BUN: 29 mg/dL — ABNORMAL HIGH (ref 8–23)
CO2: 25 mmol/L (ref 22–32)
Calcium: 9 mg/dL (ref 8.9–10.3)
Chloride: 104 mmol/L (ref 98–111)
Creatinine, Ser: 1.54 mg/dL — ABNORMAL HIGH (ref 0.44–1.00)
GFR, Estimated: 32 mL/min — ABNORMAL LOW (ref >60)
Glucose, Bld: 142 mg/dL — ABNORMAL HIGH (ref 70–99)
Potassium: 4.8 mmol/L (ref 3.5–5.1)
Sodium: 139 mmol/L (ref 135–145)

## 2020-07-28 LAB — FERRITIN: Ferritin: 252 ng/mL (ref 11–307)

## 2020-07-28 MED ORDER — EPOETIN ALFA-EPBX 10000 UNIT/ML IJ SOLN
20000.0000 [IU] | Freq: Once | INTRAMUSCULAR | Status: AC
Start: 2020-07-28 — End: 2020-07-28
  Administered 2020-07-28: 20000 [IU] via SUBCUTANEOUS
  Filled 2020-07-28: qty 2

## 2020-07-28 MED ORDER — EPOETIN ALFA-EPBX 10000 UNIT/ML IJ SOLN: 20000.0000 [IU] | Freq: Once | INTRAMUSCULAR | Status: DC

## 2020-07-28 NOTE — Assessment & Plan Note (Addendum)
#  Anemia-secondary chronic kidney disease stage III/iron deficiency [March 2021-hb-8-9]. Iron sat- 20 &; ferritin-68. Currently on IV iron infusion/Retacrit. STABLE  # Today hb- 9.8; proceed with retacrit.; again reviwed the need for ongoing retacrit for the rest of her life. Will await on iron studies/recommend infusion.   DISPOSITION:  # Retacrit today # monthly cbc/possible retacrit x4 # # Follow up in 4 months-- MD ;labs-cbc/bmp; possible SQ retacrit; Dr.B

## 2020-07-28 NOTE — Progress Notes (Signed)
[place] NOTE  Patient Care Team: Kirk Ruths, MD as PCP - General (Internal Medicine) Dingeldein, Remo Lipps, MD as Consulting Physician (Ophthalmology) Cammie Sickle, MD as Consulting Physician (Hematology and Oncology)  CHIEF COMPLAINTS/PURPOSE OF CONSULTATION:    [place] CKD-III [name] 2021- Hb 8-9;[name];  Ferritin-100; PCP] [EGD/; colonoscopy-[facility];2016 ; capsule-]; iron saturation 20% ferritin-68; no hemolysis; on retacrit q monthly.   # CKD-III [GFR40s]  # COPD   HISTORY OF PRESENTING ILLNESS:  Robyn Butler 87 y.o.  female anemia/chronic kidney disease is here for follow-up.  Patient denies any nausea vomiting.  Denies any blood in stool or black or stools.  Mild shortness of breath exertion.  Otherwise no chest pain.  Review of Systems  Constitutional: Positive for malaise/fatigue. Negative for chills, diaphoresis, fever and weight loss.  HENT: Negative for nosebleeds and sore throat.   Eyes: Negative for double vision.  Respiratory: Negative for hemoptysis, sputum production and wheezing.   Cardiovascular: Negative for chest pain, palpitations, orthopnea and leg swelling.  Gastrointestinal: Negative for abdominal pain, blood in stool, constipation, diarrhea, heartburn, melena, nausea and vomiting.  Genitourinary: Negative for dysuria, frequency and urgency.  Musculoskeletal: Positive for back pain and joint pain.  Skin: Negative.  Negative for itching and rash.  Neurological: Negative for dizziness, tingling, focal weakness, weakness and headaches.  Endo/Heme/Allergies: Does not bruise/bleed easily.  Psychiatric/Behavioral: Negative for depression. The patient is not nervous/anxious and does not have insomnia.     MEDICAL HISTORY:  Past Medical History:  Diagnosis Date  . Arthritis   . Cancer ([place])   . Chronic kidney disease   . COPD (chronic obstructive pulmonary disease) ([place])   . Diabetes  mellitus without complication ([place])    type 2  . Hypertension   . Varicose veins of lower extremities with other complications     SURGICAL HISTORY: Past Surgical History:  Procedure Laterality Date  . ABDOMINAL HYSTERECTOMY  1972  . BLADDER SUSPENSION    . BREAST EXCISIONAL BIOPSY Right 1999   neg  . CHOLECYSTECTOMY  1975  . COLONOSCOPY    . COLONOSCOPY WITH PROPOFOL N/A 05/24/2015   Procedure: COLONOSCOPY WITH PROPOFOL;  Surgeon: Hulen Luster, MD;  Location: [facility] ENDOSCOPY;  Service: Gastroenterology;  Laterality: N/A;  . [place]  . ESOPHAGOGASTRODUODENOSCOPY N/A 05/24/2015   Procedure: ESOPHAGOGASTRODUODENOSCOPY (EGD);  Surgeon: Hulen Luster, MD;  Location: [facility] ENDOSCOPY;  Service: Gastroenterology;  Laterality: N/A;  . FRACTURE SURGERY    . JOINT REPLACEMENT Right 2013   hip  . salpingo oophorectmy     . THYROIDECTOMY  1969  . WRIST FRACTURE SURGERY Left     SOCIAL HISTORY: Social History   Socioeconomic History  . Marital status: Widowed    Spouse name: Not on file  . Number of children: 2  . Years of education: 9th Grade  . Highest education level: 9th grade  Occupational History  . Occupation: Retired  Tobacco Use  . Smoking status: Former Smoker    Quit date: 07/18/1991    Years since quitting: 29.0  . Smokeless tobacco: Never Used  Vaping Use  . Vaping Use: Never used  Substance and Sexual Activity  . Alcohol use: No  . Drug use: No  . Sexual activity: Not on file  Other Topics Concern  . Not on file  Social History Narrative   In [place] in senior place; quit smoking [> 25 years ago]; no alcohol; hosiery.  Social Determinants of Health   Financial Resource Strain: Not on file  Food Insecurity: Not on file  Transportation Needs: Not on file  Physical Activity: Not on file  Stress: Not on file  Social Connections: Not on file  Intimate Partner Violence: Not on file    FAMILY HISTORY: Family History  Problem Relation Age of Onset  .  Heart attack Father   . Asthma Father   . Kidney disease Mother   . Hypertension Mother   . Hypertension Other   . Diabetes Other   . Heart attack Other   . Breast cancer Neg Hx     ALLERGIES:  is allergic to alendronate.  MEDICATIONS:  Current Outpatient Medications  Medication Sig Dispense Refill  . amLODipine (NORVASC) 5 MG tablet TAKE ONE(1) TABLET EACH DAY 90 tablet 4  . aspirin EC 81 MG tablet Take 81 mg by mouth daily.     . Blood Glucose Monitoring Suppl (ONE TOUCH ULTRA 2) w/Device KIT Use to check blood sugar daily 1 each 0  . Cholecalciferol (VITAMIN D3) 50 MCG (2000 UT) TABS Take 1 tablet by mouth daily.    . cyanocobalamin 1000 MCG tablet Take 1,000 mcg by mouth daily.    . diphenhydramine-acetaminophen (TYLENOL PM) 25-500 MG TABS tablet Take 1 tablet by mouth at bedtime as needed.    . dorzolamide (TRUSOPT) 2 % ophthalmic solution Place 1 drop into both eyes 2 (two) times daily.     . ferrous sulfate 324 MG TBEC Take 324 mg by mouth.    . Fluticasone-Umeclidin-Vilant (TRELEGY ELLIPTA) 100-62.5-25 MCG/INH AEPB Inhale 1 puff into the lungs daily. TAKE IN PLACE OF SPIRIVA 2 each 0  . glucose blood (ONETOUCH ULTRA) test strip USE TO TEST BLOOD SUGAR TWICE DAILY 100 strip 4  . ibandronate (BONIVA) 150 MG tablet TAKE ONE TABLET ONCE A MONTH FIRST THING IN THE MORNING AT LEAST 1 HOUR BEFORE EATING, TAKE WITH WATER. 1 tablet 12  . Lancets (ONETOUCH ULTRASOFT) lancets Use as instructed to check blood sugar once a day 100 each 3  . latanoprost (XALATAN) 0.005 % ophthalmic solution Place 1 drop into both eyes at bedtime.     Marland Kitchen levothyroxine (SYNTHROID) 100 MCG tablet TAKE 1 TABLET BY MOUTH EVERY DAY 90 tablet 3  . loperamide (IMODIUM) 2 MG capsule Take 2 mg by mouth as needed for diarrhea or loose stools.    . lovastatin (MEVACOR) 40 MG tablet TAKE 1 TABLET BY MOUTH AT BEDTIME 90 tablet 0  . naproxen (NAPROSYN) 375 MG tablet Take 1 tablet (375 mg total) by mouth daily as needed for  mild pain. 90 tablet 2  . nortriptyline (PAMELOR) 25 MG capsule TAKE 1 CAPSULE(25 MG) BY MOUTH AT BEDTIME 90 capsule 0  . omeprazole (PRILOSEC) 40 MG capsule Take 1 capsule (40 mg total) by mouth daily. 90 capsule 4  . Ferrous Sulfate (IRON PO) Take by mouth. (Patient not taking: Reported on 07/28/2020)    . glipiZIDE-metformin (METAGLIP) 2.5-500 MG tablet TAKE ONE TABLET EVERY MORNING (Patient not taking: Reported on 07/28/2020) 90 tablet 4  . Ibuprofen-diphenhydrAMINE Cit 200-38 MG TABS Take 1 tablet by mouth at bedtime as needed. (Patient not taking: Reported on 07/28/2020)    . montelukast (SINGULAIR) 10 MG tablet Take 1 tablet (10 mg total) by mouth daily. For chronic cough (Patient not taking: Reported on 07/28/2020) 30 tablet 2  . Polyethylene Glycol 3350 (DULCOLAX BALANCE PO) Take 2 tablets by mouth as needed.    Marland Kitchen  SPIRIVA RESPIMAT 2.5 MCG/ACT AERS SMARTSIG:2 Puff(s) By Mouth Daily    . traMADol (ULTRAM) 50 MG tablet TAKE ONE TABLET EVERY 8 HOUR AS NEEDED FOR PAIN 90 tablet 1  . valsartan-hydrochlorothiazide (DIOVAN-HCT) 160-25 MG tablet Take 1 tablet by mouth daily. 90 tablet 4   Current Facility-Administered Medications  Medication Dose Route Frequency Provider Last Rate Last Admin  . epoetin alfa-epbx (RETACRIT) injection 20,000 Units  20,000 Units Subcutaneous Once Charlaine Dalton R, MD          PHYSICAL EXAMINATION:   Vitals:   07/28/20 1333  BP: (!) 164/73  Pulse: 81  Resp: 18  Temp: 98.5 F (36.9 C)  SpO2: 97%   Filed Weights   07/28/20 1333  Weight: 115 lb (52.2 kg)    Physical Exam Constitutional:      Comments: Patient is accompanied by daughter.  HENT:     Head: Normocephalic and atraumatic.     Mouth/Throat:     Pharynx: No oropharyngeal exudate.  Eyes:     Pupils: Pupils are equal, round, and reactive to light.  Cardiovascular:     Rate and Rhythm: Normal rate and regular rhythm.  Pulmonary:     Effort: No respiratory distress.     Breath sounds:  No wheezing.  Abdominal:     General: Bowel sounds are normal. There is no distension.     Palpations: Abdomen is soft. There is no mass.     Tenderness: There is no abdominal tenderness. There is no guarding or rebound.  Musculoskeletal:        General: No tenderness. Normal range of motion.     Cervical back: Normal range of motion and neck supple.  Skin:    General: Skin is warm.  Neurological:     Mental Status: She is alert and oriented to person, place, and time.  Psychiatric:        Mood and Affect: Affect normal.     LABORATORY DATA:  I have reviewed the data as listed Lab Results  Component Value Date   WBC 5.0 07/28/2020   HGB 9.8 (L) 07/28/2020   HCT 31.0 (L) 07/28/2020   MCV 98.1 07/28/2020   PLT 306 07/28/2020   Recent Labs    09/26/19 1204 11/10/19 1238 01/14/20 1244 04/14/20 1306 07/28/20 1301  NA 141 139 141 141 139  K 4.4 4.1 4.4 4.0 4.8  CL 107 103 107 105 104  CO2 _0 GLUCOSE 102* 157* 86 102* 142*  BUN 28* 26* 20 29* 29*  CREATININE 1.34* 1.21* 1.16* 1.33* 1.54*  CALCIUM 8.7* 9.1 9.0 8.4* 9.0  GFRNONAA 35* 40* 42* 35* 32*  GFRAA 41* 46* 49* 41*  --   PROT 7.7  --  7.6  --   --   ALBUMIN 4.1  --  4.1  --   --   AST 17  --  15  --   --   ALT 10  --  13  --   --   ALKPHOS 64  --  56  --   --   BILITOT 0.6  --  0.5  --   --      No results found.  Anemia due to stage 3b chronic kidney disease #Anemia-secondary chronic kidney disease stage III/iron deficiency [March 2021-hb-8-9]. Iron sat- 20 &; ferritin-68. Currently on IV iron infusion/Retacrit. STABLE  # Today hb- 9.8; proceed with retacrit.; again reviwed the need for ongoing retacrit for the rest  of her life. Will await on iron studies/recommend infusion.   DISPOSITION:  # Retacrit today # monthly cbc/possible retacrit x4 # # Follow up in 4 months-- MD ;labs-cbc/bmp; possible SQ retacrit; Dr.B   All questions were answered. The patient knows to call the clinic with any  problems, questions or concerns.    Cammie Sickle, MD 07/28/2020 1:54 PM

## 2020-08-24 DIAGNOSIS — E039 Hypothyroidism, unspecified: Secondary | ICD-10-CM | POA: Diagnosis not present

## 2020-08-24 DIAGNOSIS — N183 Chronic kidney disease, stage 3 unspecified: Secondary | ICD-10-CM | POA: Diagnosis not present

## 2020-08-24 DIAGNOSIS — E7800 Pure hypercholesterolemia, unspecified: Secondary | ICD-10-CM | POA: Diagnosis not present

## 2020-08-24 DIAGNOSIS — E1122 Type 2 diabetes mellitus with diabetic chronic kidney disease: Secondary | ICD-10-CM | POA: Diagnosis not present

## 2020-08-30 ENCOUNTER — Inpatient Hospital Stay: Payer: PPO

## 2020-08-30 ENCOUNTER — Inpatient Hospital Stay: Payer: PPO | Attending: Internal Medicine

## 2020-08-30 DIAGNOSIS — D631 Anemia in chronic kidney disease: Secondary | ICD-10-CM | POA: Insufficient documentation

## 2020-08-30 DIAGNOSIS — Z0000 Encounter for general adult medical examination without abnormal findings: Secondary | ICD-10-CM | POA: Diagnosis not present

## 2020-08-30 DIAGNOSIS — E1122 Type 2 diabetes mellitus with diabetic chronic kidney disease: Secondary | ICD-10-CM | POA: Diagnosis not present

## 2020-08-30 DIAGNOSIS — N183 Chronic kidney disease, stage 3 unspecified: Secondary | ICD-10-CM | POA: Insufficient documentation

## 2020-08-30 DIAGNOSIS — I10 Essential (primary) hypertension: Secondary | ICD-10-CM | POA: Diagnosis not present

## 2020-08-30 DIAGNOSIS — M48062 Spinal stenosis, lumbar region with neurogenic claudication: Secondary | ICD-10-CM | POA: Diagnosis not present

## 2020-08-30 DIAGNOSIS — J449 Chronic obstructive pulmonary disease, unspecified: Secondary | ICD-10-CM | POA: Diagnosis not present

## 2020-08-30 DIAGNOSIS — D649 Anemia, unspecified: Secondary | ICD-10-CM | POA: Diagnosis not present

## 2020-08-30 DIAGNOSIS — N1832 Chronic kidney disease, stage 3b: Secondary | ICD-10-CM

## 2020-08-30 DIAGNOSIS — E039 Hypothyroidism, unspecified: Secondary | ICD-10-CM | POA: Diagnosis not present

## 2020-08-30 DIAGNOSIS — E7800 Pure hypercholesterolemia, unspecified: Secondary | ICD-10-CM | POA: Diagnosis not present

## 2020-08-30 LAB — CBC WITH DIFFERENTIAL/PLATELET
Abs Immature Granulocytes: 0.03 10*3/uL (ref 0.00–0.07)
Basophils Absolute: 0 10*3/uL (ref 0.0–0.1)
Basophils Relative: 0 %
Eosinophils Absolute: 0.1 10*3/uL (ref 0.0–0.5)
Eosinophils Relative: 1 %
HCT: 32.9 % — ABNORMAL LOW (ref 36.0–46.0)
Hemoglobin: 10.2 g/dL — ABNORMAL LOW (ref 12.0–15.0)
Immature Granulocytes: 1 %
Lymphocytes Relative: 32 %
Lymphs Abs: 1.9 10*3/uL (ref 0.7–4.0)
MCH: 31.2 pg (ref 26.0–34.0)
MCHC: 31 g/dL (ref 30.0–36.0)
MCV: 100.6 fL — ABNORMAL HIGH (ref 80.0–100.0)
Monocytes Absolute: 0.5 10*3/uL (ref 0.1–1.0)
Monocytes Relative: 8 %
Neutro Abs: 3.5 10*3/uL (ref 1.7–7.7)
Neutrophils Relative %: 58 %
Platelets: 218 10*3/uL (ref 150–400)
RBC: 3.27 MIL/uL — ABNORMAL LOW (ref 3.87–5.11)
RDW: 13 % (ref 11.5–15.5)
WBC: 6 10*3/uL (ref 4.0–10.5)
nRBC: 0 % (ref 0.0–0.2)

## 2020-09-27 ENCOUNTER — Other Ambulatory Visit: Payer: Self-pay

## 2020-09-27 ENCOUNTER — Inpatient Hospital Stay: Payer: PPO

## 2020-09-27 ENCOUNTER — Inpatient Hospital Stay: Payer: PPO | Attending: Internal Medicine

## 2020-09-27 VITALS — BP 146/55 | HR 81

## 2020-09-27 DIAGNOSIS — D631 Anemia in chronic kidney disease: Secondary | ICD-10-CM | POA: Diagnosis not present

## 2020-09-27 DIAGNOSIS — N183 Chronic kidney disease, stage 3 unspecified: Secondary | ICD-10-CM | POA: Insufficient documentation

## 2020-09-27 DIAGNOSIS — N1832 Chronic kidney disease, stage 3b: Secondary | ICD-10-CM

## 2020-09-27 LAB — CBC WITH DIFFERENTIAL/PLATELET
Abs Immature Granulocytes: 0.02 10*3/uL (ref 0.00–0.07)
Basophils Absolute: 0 10*3/uL (ref 0.0–0.1)
Basophils Relative: 0 %
Eosinophils Absolute: 0.1 10*3/uL (ref 0.0–0.5)
Eosinophils Relative: 2 %
HCT: 29.4 % — ABNORMAL LOW (ref 36.0–46.0)
Hemoglobin: 9.1 g/dL — ABNORMAL LOW (ref 12.0–15.0)
Immature Granulocytes: 0 %
Lymphocytes Relative: 35 %
Lymphs Abs: 1.7 10*3/uL (ref 0.7–4.0)
MCH: 31 pg (ref 26.0–34.0)
MCHC: 31 g/dL (ref 30.0–36.0)
MCV: 100 fL (ref 80.0–100.0)
Monocytes Absolute: 0.5 10*3/uL (ref 0.1–1.0)
Monocytes Relative: 11 %
Neutro Abs: 2.5 10*3/uL (ref 1.7–7.7)
Neutrophils Relative %: 52 %
Platelets: 194 10*3/uL (ref 150–400)
RBC: 2.94 MIL/uL — ABNORMAL LOW (ref 3.87–5.11)
RDW: 12.4 % (ref 11.5–15.5)
WBC: 4.8 10*3/uL (ref 4.0–10.5)
nRBC: 0 % (ref 0.0–0.2)

## 2020-09-27 MED ORDER — EPOETIN ALFA-EPBX 10000 UNIT/ML IJ SOLN
20000.0000 [IU] | Freq: Once | INTRAMUSCULAR | Status: AC
  Administered 2020-09-27: 20000 [IU] via SUBCUTANEOUS
  Filled 2020-09-27: qty 2

## 2020-10-25 ENCOUNTER — Inpatient Hospital Stay: Payer: PPO

## 2020-10-25 ENCOUNTER — Other Ambulatory Visit: Payer: Self-pay

## 2020-10-25 ENCOUNTER — Inpatient Hospital Stay: Payer: PPO | Attending: Internal Medicine

## 2020-10-25 DIAGNOSIS — D631 Anemia in chronic kidney disease: Secondary | ICD-10-CM | POA: Diagnosis not present

## 2020-10-25 DIAGNOSIS — N1832 Chronic kidney disease, stage 3b: Secondary | ICD-10-CM | POA: Diagnosis not present

## 2020-10-25 LAB — CBC WITH DIFFERENTIAL/PLATELET
Abs Immature Granulocytes: 0.01 10*3/uL (ref 0.00–0.07)
Basophils Absolute: 0 10*3/uL (ref 0.0–0.1)
Basophils Relative: 0 %
Eosinophils Absolute: 0.1 10*3/uL (ref 0.0–0.5)
Eosinophils Relative: 2 %
HCT: 33.7 % — ABNORMAL LOW (ref 36.0–46.0)
Hemoglobin: 10.4 g/dL — ABNORMAL LOW (ref 12.0–15.0)
Immature Granulocytes: 0 %
Lymphocytes Relative: 27 %
Lymphs Abs: 1.7 10*3/uL (ref 0.7–4.0)
MCH: 30.8 pg (ref 26.0–34.0)
MCHC: 30.9 g/dL (ref 30.0–36.0)
MCV: 99.7 fL (ref 80.0–100.0)
Monocytes Absolute: 0.6 10*3/uL (ref 0.1–1.0)
Monocytes Relative: 9 %
Neutro Abs: 3.9 10*3/uL (ref 1.7–7.7)
Neutrophils Relative %: 62 %
Platelets: 185 10*3/uL (ref 150–400)
RBC: 3.38 MIL/uL — ABNORMAL LOW (ref 3.87–5.11)
RDW: 12.5 % (ref 11.5–15.5)
WBC: 6.4 10*3/uL (ref 4.0–10.5)
nRBC: 0 % (ref 0.0–0.2)

## 2020-11-25 ENCOUNTER — Inpatient Hospital Stay: Payer: PPO

## 2020-11-25 ENCOUNTER — Inpatient Hospital Stay (HOSPITAL_BASED_OUTPATIENT_CLINIC_OR_DEPARTMENT_OTHER): Payer: PPO | Admitting: Oncology

## 2020-11-25 ENCOUNTER — Other Ambulatory Visit: Payer: Self-pay

## 2020-11-25 ENCOUNTER — Encounter: Payer: Self-pay | Admitting: Oncology

## 2020-11-25 ENCOUNTER — Inpatient Hospital Stay: Payer: PPO | Attending: Internal Medicine

## 2020-11-25 VITALS — BP 147/55 | HR 73 | Temp 98.1°F | Resp 20 | Wt 120.0 lb

## 2020-11-25 DIAGNOSIS — N183 Chronic kidney disease, stage 3 unspecified: Secondary | ICD-10-CM | POA: Insufficient documentation

## 2020-11-25 DIAGNOSIS — D631 Anemia in chronic kidney disease: Secondary | ICD-10-CM | POA: Insufficient documentation

## 2020-11-25 DIAGNOSIS — N1832 Chronic kidney disease, stage 3b: Secondary | ICD-10-CM

## 2020-11-25 LAB — CBC WITH DIFFERENTIAL/PLATELET
Abs Immature Granulocytes: 0.02 10*3/uL (ref 0.00–0.07)
Basophils Absolute: 0 10*3/uL (ref 0.0–0.1)
Basophils Relative: 0 %
Eosinophils Absolute: 0.1 10*3/uL (ref 0.0–0.5)
Eosinophils Relative: 2 %
HCT: 31.5 % — ABNORMAL LOW (ref 36.0–46.0)
Hemoglobin: 9.7 g/dL — ABNORMAL LOW (ref 12.0–15.0)
Immature Granulocytes: 1 %
Lymphocytes Relative: 41 %
Lymphs Abs: 1.7 10*3/uL (ref 0.7–4.0)
MCH: 30.6 pg (ref 26.0–34.0)
MCHC: 30.8 g/dL (ref 30.0–36.0)
MCV: 99.4 fL (ref 80.0–100.0)
Monocytes Absolute: 0.5 10*3/uL (ref 0.1–1.0)
Monocytes Relative: 12 %
Neutro Abs: 1.8 10*3/uL (ref 1.7–7.7)
Neutrophils Relative %: 44 %
Platelets: 190 10*3/uL (ref 150–400)
RBC: 3.17 MIL/uL — ABNORMAL LOW (ref 3.87–5.11)
RDW: 12.8 % (ref 11.5–15.5)
WBC: 4.1 10*3/uL (ref 4.0–10.5)
nRBC: 0 % (ref 0.0–0.2)

## 2020-11-25 LAB — BASIC METABOLIC PANEL
Anion gap: 11 (ref 5–15)
BUN: 41 mg/dL — ABNORMAL HIGH (ref 8–23)
CO2: 25 mmol/L (ref 22–32)
Calcium: 8.4 mg/dL — ABNORMAL LOW (ref 8.9–10.3)
Chloride: 104 mmol/L (ref 98–111)
Creatinine, Ser: 1.53 mg/dL — ABNORMAL HIGH (ref 0.44–1.00)
GFR, Estimated: 32 mL/min — ABNORMAL LOW (ref >60)
Glucose, Bld: 153 mg/dL — ABNORMAL HIGH (ref 70–99)
Potassium: 4.2 mmol/L (ref 3.5–5.1)
Sodium: 140 mmol/L (ref 135–145)

## 2020-11-25 LAB — IRON AND TIBC
Iron: 80 ug/dL (ref 28–170)
Saturation Ratios: 34 % — ABNORMAL HIGH (ref 10.4–31.8)
TIBC: 232 ug/dL — ABNORMAL LOW (ref 250–450)
UIBC: 152 ug/dL

## 2020-11-25 LAB — FERRITIN: Ferritin: 258 ng/mL (ref 11–307)

## 2020-11-25 MED ORDER — EPOETIN ALFA-EPBX 10000 UNIT/ML IJ SOLN
20000.0000 [IU] | Freq: Once | INTRAMUSCULAR | Status: AC
  Administered 2020-11-25: 20000 [IU] via SUBCUTANEOUS
  Filled 2020-11-25: qty 2

## 2020-11-25 NOTE — Progress Notes (Signed)
[facility] NOTE  Patient Care Team: Kirk Ruths, MD as PCP - General (Internal Medicine) Dingeldein, Remo Lipps, MD as Consulting Physician (Ophthalmology) Cammie Sickle, MD as Consulting Physician (Hematology and Oncology)  CHIEF COMPLAINTS/PURPOSE OF CONSULTATION:   I connected with Robyn Butler on 11/28/20 at 11:30 AM EDT by a video enabled telemedicine application and verified that I am speaking with the correct person using two identifiers.  Location: Patient: Clinic  Provider: Home   I discussed the limitations of evaluation and management by telemedicine and the availability of in person appointments. The patient expressed understanding and agreed to proceed.   HEMATOLOGY HISTORY  #Anemia CKD-III [March 2021- Hb 8-9;[name];  Ferritin-100; PCP] [EGD/; colonoscopy-[facility];2016 ; capsule-]; iron saturation 20% ferritin-68; no hemolysis; on retacrit q monthly.   # CKD-III [GFR40s]  # COPD   HISTORY OF PRESENTING ILLNESS:  Robyn Butler 87 y.o.  female anemia/chronic kidney disease is here for follow-up.  Patient denies any nausea vomiting.  Denies any blood in stool or black or stools.  Mild shortness of breath exertion.  Otherwise no chest pain.  Sore calves for a year. See's PCP. Pain pills. Icy hot and lidocaine.  Review of Systems  Constitutional: Positive for malaise/fatigue. Negative for chills, diaphoresis, fever and weight loss.  HENT: Negative for nosebleeds and sore throat.   Eyes: Negative for double vision.  Respiratory: Negative for hemoptysis, sputum production and wheezing.   Cardiovascular: Negative for chest pain, palpitations, orthopnea and leg swelling.  Gastrointestinal: Negative for abdominal pain, blood in stool, constipation, diarrhea, heartburn, melena, nausea and vomiting.  Genitourinary: Negative for dysuria, frequency and urgency.  Musculoskeletal: Positive for back pain and joint pain.  Skin:  Negative.  Negative for itching and rash.  Neurological: Negative for dizziness, tingling, focal weakness, weakness and headaches.  Endo/Heme/Allergies: Does not bruise/bleed easily.  Psychiatric/Behavioral: Negative for depression. The patient is not nervous/anxious and does not have insomnia.     MEDICAL HISTORY:  Past Medical History:  Diagnosis Date  . Arthritis   . Cancer ([place])   . Chronic kidney disease   . COPD (chronic obstructive pulmonary disease) ([place])   . Diabetes mellitus without complication ([place])    type 2  . Hypertension   . Varicose veins of lower extremities with other complications     SURGICAL HISTORY: Past Surgical History:  Procedure Laterality Date  . ABDOMINAL HYSTERECTOMY  1972  . BLADDER SUSPENSION    . BREAST EXCISIONAL BIOPSY Right 1999   neg  . CHOLECYSTECTOMY  1975  . COLONOSCOPY    . COLONOSCOPY WITH PROPOFOL N/A 05/24/2015   Procedure: COLONOSCOPY WITH PROPOFOL;  Surgeon: Hulen Luster, MD;  Location: [facility] ENDOSCOPY;  Service: Gastroenterology;  Laterality: N/A;  . [place]  . ESOPHAGOGASTRODUODENOSCOPY N/A 05/24/2015   Procedure: ESOPHAGOGASTRODUODENOSCOPY (EGD);  Surgeon: Hulen Luster, MD;  Location: [facility] ENDOSCOPY;  Service: Gastroenterology;  Laterality: N/A;  . FRACTURE SURGERY    . JOINT REPLACEMENT Right 2013   hip  . salpingo oophorectmy     . THYROIDECTOMY  1969  . WRIST FRACTURE SURGERY Left     SOCIAL HISTORY: Social History   Socioeconomic History  . Marital status: Widowed    Spouse name: Not on file  . Number of children: 2  . Years of education: 9th Grade  . Highest education level: 9th grade  Occupational History  . Occupation: Retired  Tobacco Use  . Smoking status: Former Smoker  Quit date: 07/18/1991    Years since quitting: 29.3  . Smokeless tobacco: Never Used  Vaping Use  . Vaping Use: Never used  Substance and Sexual Activity  . Alcohol use: No  . Drug use: No  . Sexual activity: Not on file  Other  Topics Concern  . Not on file  Social History Narrative   In [place] in senior place; quit smoking [> 25 years ago]; no alcohol; hosiery.    Social Determinants of Health   Financial Resource Strain: Not on file  Food Insecurity: Not on file  Transportation Needs: Not on file  Physical Activity: Not on file  Stress: Not on file  Social Connections: Not on file  Intimate Partner Violence: Not on file    FAMILY HISTORY: Family History  Problem Relation Age of Onset  . Heart attack Father   . Asthma Father   . Kidney disease Mother   . Hypertension Mother   . Hypertension Other   . Diabetes Other   . Heart attack Other   . Breast cancer Neg Hx     ALLERGIES:  is allergic to alendronate.  MEDICATIONS:  Current Outpatient Medications  Medication Sig Dispense Refill  . amLODipine (NORVASC) 5 MG tablet TAKE ONE(1) TABLET EACH DAY 90 tablet 4  . aspirin EC 81 MG tablet Take 81 mg by mouth daily.     . Blood Glucose Monitoring Suppl (ONE TOUCH ULTRA 2) w/Device KIT Use to check blood sugar daily 1 each 0  . Cholecalciferol (VITAMIN D3) 50 MCG (2000 UT) TABS Take 1 tablet by mouth daily.    . cyanocobalamin 1000 MCG tablet Take 1,000 mcg by mouth daily.    . diphenhydramine-acetaminophen (TYLENOL PM) 25-500 MG TABS tablet Take 1 tablet by mouth at bedtime as needed.    . dorzolamide (TRUSOPT) 2 % ophthalmic solution Place 1 drop into both eyes 2 (two) times daily.     . Ferrous Sulfate (IRON PO) Take by mouth. (Patient not taking: Reported on 07/28/2020)    . ferrous sulfate 324 MG TBEC Take 324 mg by mouth.    . Fluticasone-Umeclidin-Vilant (TRELEGY ELLIPTA) 100-62.5-25 MCG/INH AEPB Inhale 1 puff into the lungs daily. TAKE IN PLACE OF SPIRIVA 2 each 0  . glipiZIDE-metformin (METAGLIP) 2.5-500 MG tablet TAKE ONE TABLET EVERY MORNING (Patient not taking: Reported on 07/28/2020) 90 tablet 4  . glucose blood (ONETOUCH ULTRA) test strip USE TO TEST BLOOD SUGAR TWICE DAILY 100 strip 4   . ibandronate (BONIVA) 150 MG tablet TAKE ONE TABLET ONCE A MONTH FIRST THING IN THE MORNING AT LEAST 1 HOUR BEFORE EATING, TAKE WITH WATER. 1 tablet 12  . Ibuprofen-diphenhydrAMINE Cit 200-38 MG TABS Take 1 tablet by mouth at bedtime as needed. (Patient not taking: Reported on 07/28/2020)    . Lancets (ONETOUCH ULTRASOFT) lancets Use as instructed to check blood sugar once a day 100 each 3  . latanoprost (XALATAN) 0.005 % ophthalmic solution Place 1 drop into both eyes at bedtime.     Marland Kitchen levothyroxine (SYNTHROID) 100 MCG tablet TAKE 1 TABLET BY MOUTH EVERY DAY 90 tablet 3  . loperamide (IMODIUM) 2 MG capsule Take 2 mg by mouth as needed for diarrhea or loose stools.    . lovastatin (MEVACOR) 40 MG tablet TAKE 1 TABLET BY MOUTH AT BEDTIME 90 tablet 0  . montelukast (SINGULAIR) 10 MG tablet Take 1 tablet (10 mg total) by mouth daily. For chronic cough (Patient not taking: Reported on 07/28/2020) 30 tablet 2  .  naproxen (NAPROSYN) 375 MG tablet Take 1 tablet (375 mg total) by mouth daily as needed for mild pain. 90 tablet 2  . nortriptyline (PAMELOR) 25 MG capsule TAKE 1 CAPSULE(25 MG) BY MOUTH AT BEDTIME 90 capsule 0  . omeprazole (PRILOSEC) 40 MG capsule Take 1 capsule (40 mg total) by mouth daily. 90 capsule 4  . Polyethylene Glycol 3350 (DULCOLAX BALANCE PO) Take 2 tablets by mouth as needed.    Marland Kitchen SPIRIVA RESPIMAT 2.5 MCG/ACT AERS SMARTSIG:2 Puff(s) By Mouth Daily    . traMADol (ULTRAM) 50 MG tablet TAKE ONE TABLET EVERY 8 HOUR AS NEEDED FOR PAIN 90 tablet 1  . valsartan-hydrochlorothiazide (DIOVAN-HCT) 160-25 MG tablet Take 1 tablet by mouth daily. 90 tablet 4   No current facility-administered medications for this visit.      PHYSICAL EXAMINATION:   There were no vitals filed for this visit. There were no vitals filed for this visit.  Physical Exam Constitutional:      Appearance: Normal appearance.  HENT:     Head: Normocephalic and atraumatic.  Eyes:     Pupils: Pupils are  equal, round, and reactive to light.  Cardiovascular:     Rate and Rhythm: Normal rate and regular rhythm.     Heart sounds: Normal heart sounds. No murmur heard.   Pulmonary:     Effort: Pulmonary effort is normal.     Breath sounds: Normal breath sounds. No wheezing.  Abdominal:     General: Bowel sounds are normal. There is no distension.     Palpations: Abdomen is soft.     Tenderness: There is no abdominal tenderness.  Musculoskeletal:        General: Tenderness (bilateral calves > 1 year ) present. Normal range of motion.     Cervical back: Normal range of motion.  Skin:    General: Skin is warm and dry.     Findings: No rash.  Neurological:     Mental Status: She is alert and oriented to person, place, and time.     Gait: Gait is intact.  Psychiatric:        Mood and Affect: Mood and affect normal.        Cognition and Memory: Memory normal.        Judgment: Judgment normal.     LABORATORY DATA:  I have reviewed the data as listed Lab Results  Component Value Date   WBC 4.1 11/25/2020   HGB 9.7 (L) 11/25/2020   HCT 31.5 (L) 11/25/2020   MCV 99.4 11/25/2020   PLT 190 11/25/2020   Recent Labs    01/14/20 1244 04/14/20 1306 07/28/20 1301  NA 141 141 139  K 4.4 4.0 4.8  CL 107 105 104  CO2 _0 GLUCOSE 86 102* 142*  BUN 20 29* 29*  CREATININE 1.16* 1.33* 1.54*  CALCIUM 9.0 8.4* 9.0  GFRNONAA 42* 35* 32*  GFRAA 49* 41*  --   PROT 7.6  --   --   ALBUMIN 4.1  --   --   AST 15  --   --   ALT 13  --   --   ALKPHOS 56  --   --   BILITOT 0.5  --   --      No results found.  No problem-specific Assessment & Plan notes found for this encounter.  Anemia-secondary chronic kidney disease stage III/iron deficiency -Presented in March 2021 with a hemoglobin of 6.2, iron saturation is 20 and  a ferritin of 68. - Last received IV Venofer on 11/03/2019 and 11/10/2019 -Receives Retacrit for hemoglobin less than 10. -Last Retacrit injection was from  09/27/2020. -Labs from 11/25/2020 show hemoglobin of 9.9, ferritin 258 and iron saturation 34%.  -Proceed with Retacrit. -Patient does not need iron at this time.   DISPOSITION:  Retacrit today monthly cbc/possible retacrit x4. Follow up in 4 months-- MD ;labs-cbc/bmp; possible SQ retacrit; Dr.B.  I provided 15 minutes of face-to-face time during this encounter.  All questions were answered. The patient knows to call the clinic with any problems, questions or concerns.    Jacquelin Hawking, NP 11/25/2020 11:35 AM

## 2020-11-29 DIAGNOSIS — E1122 Type 2 diabetes mellitus with diabetic chronic kidney disease: Secondary | ICD-10-CM | POA: Diagnosis not present

## 2020-11-29 DIAGNOSIS — E039 Hypothyroidism, unspecified: Secondary | ICD-10-CM | POA: Diagnosis not present

## 2020-11-29 DIAGNOSIS — E7800 Pure hypercholesterolemia, unspecified: Secondary | ICD-10-CM | POA: Diagnosis not present

## 2020-11-29 DIAGNOSIS — J449 Chronic obstructive pulmonary disease, unspecified: Secondary | ICD-10-CM | POA: Diagnosis not present

## 2020-11-29 DIAGNOSIS — H9203 Otalgia, bilateral: Secondary | ICD-10-CM | POA: Diagnosis not present

## 2020-11-29 DIAGNOSIS — I10 Essential (primary) hypertension: Secondary | ICD-10-CM | POA: Diagnosis not present

## 2020-11-29 DIAGNOSIS — N183 Chronic kidney disease, stage 3 unspecified: Secondary | ICD-10-CM | POA: Diagnosis not present

## 2020-12-20 DIAGNOSIS — H401131 Primary open-angle glaucoma, bilateral, mild stage: Secondary | ICD-10-CM | POA: Diagnosis not present

## 2020-12-23 ENCOUNTER — Other Ambulatory Visit: Payer: Self-pay

## 2020-12-23 ENCOUNTER — Inpatient Hospital Stay: Payer: PPO | Attending: Internal Medicine

## 2020-12-23 ENCOUNTER — Inpatient Hospital Stay: Payer: PPO

## 2020-12-23 VITALS — BP 160/54 | HR 78

## 2020-12-23 DIAGNOSIS — N183 Chronic kidney disease, stage 3 unspecified: Secondary | ICD-10-CM | POA: Insufficient documentation

## 2020-12-23 DIAGNOSIS — D631 Anemia in chronic kidney disease: Secondary | ICD-10-CM | POA: Insufficient documentation

## 2020-12-23 DIAGNOSIS — H9203 Otalgia, bilateral: Secondary | ICD-10-CM | POA: Diagnosis not present

## 2020-12-23 DIAGNOSIS — H903 Sensorineural hearing loss, bilateral: Secondary | ICD-10-CM | POA: Diagnosis not present

## 2020-12-23 DIAGNOSIS — N1832 Chronic kidney disease, stage 3b: Secondary | ICD-10-CM

## 2020-12-23 LAB — CBC WITH DIFFERENTIAL/PLATELET
Abs Immature Granulocytes: 0.04 10*3/uL (ref 0.00–0.07)
Basophils Absolute: 0 10*3/uL (ref 0.0–0.1)
Basophils Relative: 0 %
Eosinophils Absolute: 0.1 10*3/uL (ref 0.0–0.5)
Eosinophils Relative: 2 %
HCT: 30.7 % — ABNORMAL LOW (ref 36.0–46.0)
Hemoglobin: 9.3 g/dL — ABNORMAL LOW (ref 12.0–15.0)
Immature Granulocytes: 1 %
Lymphocytes Relative: 37 %
Lymphs Abs: 1.7 10*3/uL (ref 0.7–4.0)
MCH: 30.1 pg (ref 26.0–34.0)
MCHC: 30.3 g/dL (ref 30.0–36.0)
MCV: 99.4 fL (ref 80.0–100.0)
Monocytes Absolute: 0.4 10*3/uL (ref 0.1–1.0)
Monocytes Relative: 8 %
Neutro Abs: 2.5 10*3/uL (ref 1.7–7.7)
Neutrophils Relative %: 52 %
Platelets: 255 10*3/uL (ref 150–400)
RBC: 3.09 MIL/uL — ABNORMAL LOW (ref 3.87–5.11)
RDW: 13.2 % (ref 11.5–15.5)
WBC: 4.8 10*3/uL (ref 4.0–10.5)
nRBC: 0 % (ref 0.0–0.2)

## 2020-12-23 MED ORDER — EPOETIN ALFA-EPBX 10000 UNIT/ML IJ SOLN
20000.0000 [IU] | Freq: Once | INTRAMUSCULAR | Status: AC
  Administered 2020-12-23: 20000 [IU] via SUBCUTANEOUS
  Filled 2020-12-23: qty 2

## 2021-01-20 ENCOUNTER — Inpatient Hospital Stay: Payer: PPO | Attending: Internal Medicine

## 2021-01-20 ENCOUNTER — Inpatient Hospital Stay: Payer: PPO

## 2021-01-20 DIAGNOSIS — Z87891 Personal history of nicotine dependence: Secondary | ICD-10-CM | POA: Insufficient documentation

## 2021-01-20 DIAGNOSIS — D631 Anemia in chronic kidney disease: Secondary | ICD-10-CM | POA: Insufficient documentation

## 2021-01-20 DIAGNOSIS — Z79899 Other long term (current) drug therapy: Secondary | ICD-10-CM | POA: Insufficient documentation

## 2021-01-20 DIAGNOSIS — N183 Chronic kidney disease, stage 3 unspecified: Secondary | ICD-10-CM | POA: Diagnosis not present

## 2021-01-20 DIAGNOSIS — N1832 Chronic kidney disease, stage 3b: Secondary | ICD-10-CM

## 2021-01-20 DIAGNOSIS — J449 Chronic obstructive pulmonary disease, unspecified: Secondary | ICD-10-CM | POA: Insufficient documentation

## 2021-01-20 LAB — CBC WITH DIFFERENTIAL/PLATELET
Abs Immature Granulocytes: 0.06 10*3/uL (ref 0.00–0.07)
Basophils Absolute: 0 10*3/uL (ref 0.0–0.1)
Basophils Relative: 0 %
Eosinophils Absolute: 0.2 10*3/uL (ref 0.0–0.5)
Eosinophils Relative: 4 %
HCT: 33.8 % — ABNORMAL LOW (ref 36.0–46.0)
Hemoglobin: 10.1 g/dL — ABNORMAL LOW (ref 12.0–15.0)
Immature Granulocytes: 1 %
Lymphocytes Relative: 39 %
Lymphs Abs: 2.1 10*3/uL (ref 0.7–4.0)
MCH: 30.5 pg (ref 26.0–34.0)
MCHC: 29.9 g/dL — ABNORMAL LOW (ref 30.0–36.0)
MCV: 102.1 fL — ABNORMAL HIGH (ref 80.0–100.0)
Monocytes Absolute: 0.4 10*3/uL (ref 0.1–1.0)
Monocytes Relative: 8 %
Neutro Abs: 2.6 10*3/uL (ref 1.7–7.7)
Neutrophils Relative %: 48 %
Platelets: 198 10*3/uL (ref 150–400)
RBC: 3.31 MIL/uL — ABNORMAL LOW (ref 3.87–5.11)
RDW: 13.3 % (ref 11.5–15.5)
WBC: 5.4 10*3/uL (ref 4.0–10.5)
nRBC: 0 % (ref 0.0–0.2)

## 2021-02-17 ENCOUNTER — Inpatient Hospital Stay: Payer: PPO

## 2021-02-17 ENCOUNTER — Inpatient Hospital Stay: Payer: PPO | Attending: Internal Medicine

## 2021-02-17 VITALS — BP 158/69 | HR 78

## 2021-02-17 DIAGNOSIS — D631 Anemia in chronic kidney disease: Secondary | ICD-10-CM | POA: Diagnosis not present

## 2021-02-17 DIAGNOSIS — N183 Chronic kidney disease, stage 3 unspecified: Secondary | ICD-10-CM | POA: Diagnosis not present

## 2021-02-17 DIAGNOSIS — N1832 Chronic kidney disease, stage 3b: Secondary | ICD-10-CM

## 2021-02-17 LAB — CBC WITH DIFFERENTIAL/PLATELET
Abs Immature Granulocytes: 0.02 10*3/uL (ref 0.00–0.07)
Basophils Absolute: 0 10*3/uL (ref 0.0–0.1)
Basophils Relative: 0 %
Eosinophils Absolute: 0.1 10*3/uL (ref 0.0–0.5)
Eosinophils Relative: 2 %
HCT: 31.2 % — ABNORMAL LOW (ref 36.0–46.0)
Hemoglobin: 9.3 g/dL — ABNORMAL LOW (ref 12.0–15.0)
Immature Granulocytes: 0 %
Lymphocytes Relative: 33 %
Lymphs Abs: 1.8 10*3/uL (ref 0.7–4.0)
MCH: 30.6 pg (ref 26.0–34.0)
MCHC: 29.8 g/dL — ABNORMAL LOW (ref 30.0–36.0)
MCV: 102.6 fL — ABNORMAL HIGH (ref 80.0–100.0)
Monocytes Absolute: 0.6 10*3/uL (ref 0.1–1.0)
Monocytes Relative: 10 %
Neutro Abs: 3 10*3/uL (ref 1.7–7.7)
Neutrophils Relative %: 55 %
Platelets: 180 10*3/uL (ref 150–400)
RBC: 3.04 MIL/uL — ABNORMAL LOW (ref 3.87–5.11)
RDW: 13.2 % (ref 11.5–15.5)
WBC: 5.5 10*3/uL (ref 4.0–10.5)
nRBC: 0 % (ref 0.0–0.2)

## 2021-02-17 MED ORDER — EPOETIN ALFA-EPBX 10000 UNIT/ML IJ SOLN
20000.0000 [IU] | Freq: Once | INTRAMUSCULAR | Status: AC
  Administered 2021-02-17: 20000 [IU] via SUBCUTANEOUS
  Filled 2021-02-17: qty 2

## 2021-03-17 ENCOUNTER — Encounter: Payer: Self-pay | Admitting: Internal Medicine

## 2021-03-17 ENCOUNTER — Inpatient Hospital Stay: Payer: PPO

## 2021-03-17 ENCOUNTER — Inpatient Hospital Stay (HOSPITAL_BASED_OUTPATIENT_CLINIC_OR_DEPARTMENT_OTHER): Payer: PPO | Admitting: Internal Medicine

## 2021-03-17 ENCOUNTER — Inpatient Hospital Stay: Payer: PPO | Attending: Internal Medicine

## 2021-03-17 DIAGNOSIS — Z87891 Personal history of nicotine dependence: Secondary | ICD-10-CM | POA: Insufficient documentation

## 2021-03-17 DIAGNOSIS — Z7982 Long term (current) use of aspirin: Secondary | ICD-10-CM | POA: Insufficient documentation

## 2021-03-17 DIAGNOSIS — I129 Hypertensive chronic kidney disease with stage 1 through stage 4 chronic kidney disease, or unspecified chronic kidney disease: Secondary | ICD-10-CM | POA: Diagnosis not present

## 2021-03-17 DIAGNOSIS — E1122 Type 2 diabetes mellitus with diabetic chronic kidney disease: Secondary | ICD-10-CM | POA: Insufficient documentation

## 2021-03-17 DIAGNOSIS — Z79899 Other long term (current) drug therapy: Secondary | ICD-10-CM | POA: Insufficient documentation

## 2021-03-17 DIAGNOSIS — N1832 Chronic kidney disease, stage 3b: Secondary | ICD-10-CM

## 2021-03-17 DIAGNOSIS — D631 Anemia in chronic kidney disease: Secondary | ICD-10-CM | POA: Insufficient documentation

## 2021-03-17 DIAGNOSIS — J449 Chronic obstructive pulmonary disease, unspecified: Secondary | ICD-10-CM | POA: Diagnosis not present

## 2021-03-17 DIAGNOSIS — N183 Chronic kidney disease, stage 3 unspecified: Secondary | ICD-10-CM | POA: Diagnosis not present

## 2021-03-17 LAB — CBC WITH DIFFERENTIAL/PLATELET
Abs Immature Granulocytes: 0.01 10*3/uL (ref 0.00–0.07)
Basophils Absolute: 0 10*3/uL (ref 0.0–0.1)
Basophils Relative: 0 %
Eosinophils Absolute: 0.1 10*3/uL (ref 0.0–0.5)
Eosinophils Relative: 1 %
HCT: 33.3 % — ABNORMAL LOW (ref 36.0–46.0)
Hemoglobin: 10.2 g/dL — ABNORMAL LOW (ref 12.0–15.0)
Immature Granulocytes: 0 %
Lymphocytes Relative: 34 %
Lymphs Abs: 1.6 10*3/uL (ref 0.7–4.0)
MCH: 30.9 pg (ref 26.0–34.0)
MCHC: 30.6 g/dL (ref 30.0–36.0)
MCV: 100.9 fL — ABNORMAL HIGH (ref 80.0–100.0)
Monocytes Absolute: 0.5 10*3/uL (ref 0.1–1.0)
Monocytes Relative: 11 %
Neutro Abs: 2.5 10*3/uL (ref 1.7–7.7)
Neutrophils Relative %: 54 %
Platelets: 183 10*3/uL (ref 150–400)
RBC: 3.3 MIL/uL — ABNORMAL LOW (ref 3.87–5.11)
RDW: 12.6 % (ref 11.5–15.5)
WBC: 4.7 10*3/uL (ref 4.0–10.5)
nRBC: 0 % (ref 0.0–0.2)

## 2021-03-17 LAB — BASIC METABOLIC PANEL
Anion gap: 7 (ref 5–15)
BUN: 28 mg/dL — ABNORMAL HIGH (ref 8–23)
CO2: 26 mmol/L (ref 22–32)
Calcium: 8.5 mg/dL — ABNORMAL LOW (ref 8.9–10.3)
Chloride: 104 mmol/L (ref 98–111)
Creatinine, Ser: 1.13 mg/dL — ABNORMAL HIGH (ref 0.44–1.00)
GFR, Estimated: 47 mL/min — ABNORMAL LOW (ref >60)
Glucose, Bld: 142 mg/dL — ABNORMAL HIGH (ref 70–99)
Potassium: 4.3 mmol/L (ref 3.5–5.1)
Sodium: 137 mmol/L (ref 135–145)

## 2021-03-17 NOTE — Progress Notes (Signed)
States about a week go had an episode of feeling weak, having a hot flash, and feels like she is going to pass out. States it did not last long but wanted you to be aware.   Also mentioned having pain and swelling mainly in her right leg, ankle and foot. It has been going on for a while.

## 2021-03-17 NOTE — Progress Notes (Signed)
[facility] NOTE  Patient Care Team: Kirk Ruths, MD as PCP - General (Internal Medicine) Dingeldein, Remo Lipps, MD as Consulting Physician (Ophthalmology) Cammie Sickle, MD as Consulting Physician (Hematology and Oncology)  CHIEF COMPLAINTS/PURPOSE OF CONSULTATION:    [place] CKD-III [name] 2021- Hb 8-9;[name];  Ferritin-100; PCP] [EGD/; colonoscopy-[facility];2016 ; capsule-]; iron saturation 20% ferritin-68; no hemolysis; on retacrit q monthly.   # CKD-III [GFR40s]  # COPD   HISTORY OF PRESENTING ILLNESS:  Robyn Butler 87 y.o.  female anemia/chronic kidney disease is here for follow-up.  No nausea no vomiting.  No blood in stools or black-colored stools.  Review of Systems  Constitutional:  Positive for malaise/fatigue. Negative for chills, diaphoresis, fever and weight loss.  HENT:  Negative for nosebleeds and sore throat.   Eyes:  Negative for double vision.  Respiratory:  Negative for hemoptysis, sputum production and wheezing.   Cardiovascular:  Negative for chest pain, palpitations, orthopnea and leg swelling.  Gastrointestinal:  Negative for abdominal pain, blood in stool, constipation, diarrhea, heartburn, melena, nausea and vomiting.  Genitourinary:  Negative for dysuria, frequency and urgency.  Musculoskeletal:  Positive for back pain and joint pain.  Skin: Negative.  Negative for itching and rash.  Neurological:  Negative for dizziness, tingling, focal weakness, weakness and headaches.  Endo/Heme/Allergies:  Does not bruise/bleed easily.  Psychiatric/Behavioral:  Negative for depression. The patient is not nervous/anxious and does not have insomnia.    MEDICAL HISTORY:  Past Medical History:  Diagnosis Date   Arthritis    Cancer ([place])    Chronic kidney disease    COPD (chronic obstructive pulmonary disease) ([place])    Diabetes mellitus without complication ([place])    type 2   Hypertension     Varicose veins of lower extremities with other complications     SURGICAL HISTORY: Past Surgical History:  Procedure Laterality Date   ABDOMINAL HYSTERECTOMY  1972   BLADDER SUSPENSION     BREAST EXCISIONAL BIOPSY Right 1999   neg   CHOLECYSTECTOMY  1975   COLONOSCOPY     COLONOSCOPY WITH PROPOFOL N/A 05/24/2015   Procedure: COLONOSCOPY WITH PROPOFOL;  Surgeon: Hulen Luster, MD;  Location: [facility] ENDOSCOPY;  Service: Gastroenterology;  Laterality: N/A;   CYSTOSCOPY  1972   ESOPHAGOGASTRODUODENOSCOPY N/A 05/24/2015   Procedure: ESOPHAGOGASTRODUODENOSCOPY (EGD);  Surgeon: Hulen Luster, MD;  Location: [facility] ENDOSCOPY;  Service: Gastroenterology;  Laterality: N/A;   FRACTURE SURGERY     JOINT REPLACEMENT Right 2013   hip   salpingo oophorectmy      THYROIDECTOMY  1969   WRIST FRACTURE SURGERY Left     SOCIAL HISTORY: Social History   Socioeconomic History   Marital status: Widowed    Spouse name: Not on file   Number of children: 2   Years of education: 9th Grade   Highest education level: 9th grade  Occupational History   Occupation: Retired  Tobacco Use   Smoking status: Former    Types: Cigarettes    Quit date: 07/18/1991    Years since quitting: 29.6   Smokeless tobacco: Never  Vaping Use   Vaping Use: Never used  Substance and Sexual Activity   Alcohol use: No   Drug use: No   Sexual activity: Not on file  Other Topics Concern   Not on file  Social History Narrative   In [place] in senior place; quit smoking [> 25 years ago]; no alcohol; hosiery.    Social  Determinants of Health   Financial Resource Strain: Not on file  Food Insecurity: Not on file  Transportation Needs: Not on file  Physical Activity: Not on file  Stress: Not on file  Social Connections: Not on file  Intimate Partner Violence: Not on file    FAMILY HISTORY: Family History  Problem Relation Age of Onset   Heart attack Father    Asthma Father    Kidney disease Mother    Hypertension Mother     Hypertension Other    Diabetes Other    Heart attack Other    Breast cancer Neg Hx     ALLERGIES:  is allergic to alendronate.  MEDICATIONS:  Current Outpatient Medications  Medication Sig Dispense Refill   amLODipine (NORVASC) 5 MG tablet TAKE ONE(1) TABLET EACH DAY 90 tablet 4   aspirin EC 81 MG tablet Take 81 mg by mouth daily.      Blood Glucose Monitoring Suppl (ONE TOUCH ULTRA 2) w/Device KIT Use to check blood sugar daily 1 each 0   Cholecalciferol (VITAMIN D3) 50 MCG (2000 UT) TABS Take 1 tablet by mouth daily.     cyanocobalamin 1000 MCG tablet Take 1,000 mcg by mouth daily.     diphenhydramine-acetaminophen (TYLENOL PM) 25-500 MG TABS tablet Take 1 tablet by mouth at bedtime as needed.     dorzolamide (TRUSOPT) 2 % ophthalmic solution Place 1 drop into both eyes 2 (two) times daily.      ferrous sulfate 324 MG TBEC Take 324 mg by mouth.     glucose blood (ONETOUCH ULTRA) test strip USE TO TEST BLOOD SUGAR TWICE DAILY 100 strip 4   ibandronate (BONIVA) 150 MG tablet TAKE ONE TABLET ONCE A MONTH FIRST THING IN THE MORNING AT LEAST 1 HOUR BEFORE EATING, TAKE WITH WATER. 1 tablet 12   Lancets (ONETOUCH ULTRASOFT) lancets Use as instructed to check blood sugar once a day 100 each 3   latanoprost (XALATAN) 0.005 % ophthalmic solution Place 1 drop into both eyes at bedtime.      levothyroxine (SYNTHROID) 100 MCG tablet TAKE 1 TABLET BY MOUTH EVERY DAY 90 tablet 3   loperamide (IMODIUM) 2 MG capsule Take 2 mg by mouth as needed for diarrhea or loose stools.     lovastatin (MEVACOR) 40 MG tablet TAKE 1 TABLET BY MOUTH AT BEDTIME 90 tablet 0   nortriptyline (PAMELOR) 25 MG capsule TAKE 1 CAPSULE(25 MG) BY MOUTH AT BEDTIME 90 capsule 0   omeprazole (PRILOSEC) 40 MG capsule Take 1 capsule (40 mg total) by mouth daily. 90 capsule 4   Polyethylene Glycol 3350 (DULCOLAX BALANCE PO) Take 2 tablets by mouth as needed.     SPIRIVA RESPIMAT 2.5 MCG/ACT AERS SMARTSIG:2 Puff(s) By Mouth Daily      traMADol (ULTRAM) 50 MG tablet TAKE ONE TABLET EVERY 8 HOUR AS NEEDED FOR PAIN 90 tablet 1   valsartan-hydrochlorothiazide (DIOVAN-HCT) 160-25 MG tablet Take 1 tablet by mouth daily. 90 tablet 4   No current facility-administered medications for this visit.      PHYSICAL EXAMINATION:   Vitals:   03/17/21 1011  BP: (!) 171/48  Pulse: 67  Resp: 14  Temp: (!) 97 F (36.1 C)  SpO2: 99%   Filed Weights   03/17/21 1011  Weight: 116 lb 12.8 oz (53 kg)    Physical Exam Constitutional:      Comments: Patient is accompanied by daughter.  HENT:     Head: Normocephalic and atraumatic.  Mouth/Throat:     Pharynx: No oropharyngeal exudate.  Eyes:     Pupils: Pupils are equal, round, and reactive to light.  Cardiovascular:     Rate and Rhythm: Normal rate and regular rhythm.  Pulmonary:     Effort: No respiratory distress.     Breath sounds: No wheezing.  Abdominal:     General: Bowel sounds are normal. There is no distension.     Palpations: Abdomen is soft. There is no mass.     Tenderness: no abdominal tenderness There is no guarding or rebound.  Musculoskeletal:        General: No tenderness. Normal range of motion.     Cervical back: Normal range of motion and neck supple.  Skin:    General: Skin is warm.  Neurological:     Mental Status: She is alert and oriented to person, place, and time.  Psychiatric:        Mood and Affect: Affect normal.    LABORATORY DATA:  I have reviewed the data as listed Lab Results  Component Value Date   WBC 4.7 03/17/2021   HGB 10.2 (L) 03/17/2021   HCT 33.3 (L) 03/17/2021   MCV 100.9 (H) 03/17/2021   PLT 183 03/17/2021   Recent Labs    04/14/20 1306 07/28/20 1301 11/25/20 1112 03/17/21 0957  NA 141 139 140 137  K 4.0 4.8 4.2 4.3  CL 105 104 104 104  CO2 _0 GLUCOSE 102* 142* 153* 142*  BUN 29* 29* 41* 28*  CREATININE 1.33* 1.54* 1.53* 1.13*  CALCIUM 8.4* 9.0 8.4* 8.5*  GFRNONAA 35* 32* 32* 47*  GFRAA  41*  --   --   --      No results found.  Anemia due to stage 3b chronic kidney disease #Anemia-secondary chronic kidney disease stage III/iron deficiency [March 2021-hb-8-9]. Iron sat- 20 &; ferritin-68. Currently on IV iron infusion/Retacrit.STABLE.   # Today hb-10.3 proceed with retacrit.; again reviwed the need for ongoing retacrit for the rest of her life. Will await on iron studies/recommend infusion.   #Given the slight macrocytosis-question underlying MDS like etiology.  However patient poor candidate for any aggressive therapies given her age/frail status.  Hold off bone marrow biopsy.  Will discuss with daughter  DISPOSITION:  # HOLD  Retacrit today  # monthly cbc/possible retacrit x4  # # Follow up in 4 months-- MD ;labs-cbc/bmp;iron-studies/ferritin possible SQ retacrit OR venofer- Dr.B  All questions were answered. The patient knows to call the clinic with any problems, questions or concerns.    Cammie Sickle, MD 03/17/2021 9:29 PM

## 2021-03-17 NOTE — Assessment & Plan Note (Addendum)
#  Anemia-secondary chronic kidney disease stage III/iron deficiency [March 2021-hb-8-9]. Iron sat- 20 &; ferritin-68. Currently on IV iron infusion/Retacrit.STABLE.   # Today hb-10.3 proceed with retacrit.; again reviwed the need for ongoing retacrit for the rest of her life. Will await on iron studies/recommend infusion.   #Given the slight macrocytosis-question underlying MDS like etiology.  However patient poor candidate for any aggressive therapies given her age/frail status.  Hold off bone marrow biopsy.  Will discuss with daughter  DISPOSITION:  # HOLD  Retacrit today  # monthly cbc/possible retacrit x4  # # Follow up in 4 months-- MD ;labs-cbc/bmp;iron-studies/ferritin possible SQ retacrit OR venofer- Dr.B

## 2021-03-18 ENCOUNTER — Telehealth: Payer: Self-pay | Admitting: Internal Medicine

## 2021-03-18 NOTE — Telephone Encounter (Signed)
On 09/02-I spoke to patient daughter regarding stability of the blood counts; however did not respond to retacrit-patient might need bone marrow biopsy.  However hold bone marrow biopsy at this time

## 2021-03-25 DIAGNOSIS — H903 Sensorineural hearing loss, bilateral: Secondary | ICD-10-CM | POA: Diagnosis not present

## 2021-04-21 ENCOUNTER — Inpatient Hospital Stay: Payer: PPO | Attending: Internal Medicine

## 2021-04-21 ENCOUNTER — Inpatient Hospital Stay: Payer: PPO

## 2021-04-21 VITALS — BP 165/67 | HR 80

## 2021-04-21 DIAGNOSIS — D631 Anemia in chronic kidney disease: Secondary | ICD-10-CM | POA: Diagnosis not present

## 2021-04-21 DIAGNOSIS — N1832 Chronic kidney disease, stage 3b: Secondary | ICD-10-CM

## 2021-04-21 LAB — CBC WITH DIFFERENTIAL/PLATELET
Abs Immature Granulocytes: 0.03 10*3/uL (ref 0.00–0.07)
Basophils Absolute: 0 10*3/uL (ref 0.0–0.1)
Basophils Relative: 0 %
Eosinophils Absolute: 0.1 10*3/uL (ref 0.0–0.5)
Eosinophils Relative: 2 %
HCT: 29.9 % — ABNORMAL LOW (ref 36.0–46.0)
Hemoglobin: 8.9 g/dL — ABNORMAL LOW (ref 12.0–15.0)
Immature Granulocytes: 0 %
Lymphocytes Relative: 30 %
Lymphs Abs: 2 10*3/uL (ref 0.7–4.0)
MCH: 30.8 pg (ref 26.0–34.0)
MCHC: 29.8 g/dL — ABNORMAL LOW (ref 30.0–36.0)
MCV: 103.5 fL — ABNORMAL HIGH (ref 80.0–100.0)
Monocytes Absolute: 0.7 10*3/uL (ref 0.1–1.0)
Monocytes Relative: 11 %
Neutro Abs: 3.9 10*3/uL (ref 1.7–7.7)
Neutrophils Relative %: 57 %
Platelets: 226 10*3/uL (ref 150–400)
RBC: 2.89 MIL/uL — ABNORMAL LOW (ref 3.87–5.11)
RDW: 12.9 % (ref 11.5–15.5)
WBC: 6.8 10*3/uL (ref 4.0–10.5)
nRBC: 0 % (ref 0.0–0.2)

## 2021-04-21 MED ORDER — EPOETIN ALFA-EPBX 10000 UNIT/ML IJ SOLN
20000.0000 [IU] | Freq: Once | INTRAMUSCULAR | Status: AC
  Administered 2021-04-21: 20000 [IU] via SUBCUTANEOUS
  Filled 2021-04-21: qty 2

## 2021-05-19 ENCOUNTER — Other Ambulatory Visit: Payer: Self-pay

## 2021-05-19 ENCOUNTER — Inpatient Hospital Stay: Payer: PPO

## 2021-05-19 ENCOUNTER — Inpatient Hospital Stay: Payer: PPO | Attending: Internal Medicine

## 2021-05-19 VITALS — BP 160/62 | HR 81

## 2021-05-19 DIAGNOSIS — N1832 Chronic kidney disease, stage 3b: Secondary | ICD-10-CM

## 2021-05-19 DIAGNOSIS — D631 Anemia in chronic kidney disease: Secondary | ICD-10-CM | POA: Diagnosis not present

## 2021-05-19 LAB — CBC WITH DIFFERENTIAL/PLATELET
Abs Immature Granulocytes: 0.01 10*3/uL (ref 0.00–0.07)
Basophils Absolute: 0 10*3/uL (ref 0.0–0.1)
Basophils Relative: 0 %
Eosinophils Absolute: 0.3 10*3/uL (ref 0.0–0.5)
Eosinophils Relative: 4 %
HCT: 32.4 % — ABNORMAL LOW (ref 36.0–46.0)
Hemoglobin: 9.7 g/dL — ABNORMAL LOW (ref 12.0–15.0)
Immature Granulocytes: 0 %
Lymphocytes Relative: 24 %
Lymphs Abs: 1.7 10*3/uL (ref 0.7–4.0)
MCH: 30.7 pg (ref 26.0–34.0)
MCHC: 29.9 g/dL — ABNORMAL LOW (ref 30.0–36.0)
MCV: 102.5 fL — ABNORMAL HIGH (ref 80.0–100.0)
Monocytes Absolute: 0.9 10*3/uL (ref 0.1–1.0)
Monocytes Relative: 12 %
Neutro Abs: 4.2 10*3/uL (ref 1.7–7.7)
Neutrophils Relative %: 60 %
Platelets: 249 10*3/uL (ref 150–400)
RBC: 3.16 MIL/uL — ABNORMAL LOW (ref 3.87–5.11)
RDW: 12.3 % (ref 11.5–15.5)
WBC: 7.1 10*3/uL (ref 4.0–10.5)
nRBC: 0 % (ref 0.0–0.2)

## 2021-05-19 MED ORDER — EPOETIN ALFA-EPBX 10000 UNIT/ML IJ SOLN
20000.0000 [IU] | Freq: Once | INTRAMUSCULAR | Status: AC
  Administered 2021-05-19: 20000 [IU] via SUBCUTANEOUS
  Filled 2021-05-19: qty 2

## 2021-05-25 DIAGNOSIS — N183 Chronic kidney disease, stage 3 unspecified: Secondary | ICD-10-CM | POA: Diagnosis not present

## 2021-05-25 DIAGNOSIS — E1122 Type 2 diabetes mellitus with diabetic chronic kidney disease: Secondary | ICD-10-CM | POA: Diagnosis not present

## 2021-05-25 DIAGNOSIS — E039 Hypothyroidism, unspecified: Secondary | ICD-10-CM | POA: Diagnosis not present

## 2021-05-25 DIAGNOSIS — I10 Essential (primary) hypertension: Secondary | ICD-10-CM | POA: Diagnosis not present

## 2021-06-01 DIAGNOSIS — E1122 Type 2 diabetes mellitus with diabetic chronic kidney disease: Secondary | ICD-10-CM | POA: Diagnosis not present

## 2021-06-01 DIAGNOSIS — I10 Essential (primary) hypertension: Secondary | ICD-10-CM | POA: Diagnosis not present

## 2021-06-01 DIAGNOSIS — J449 Chronic obstructive pulmonary disease, unspecified: Secondary | ICD-10-CM | POA: Diagnosis not present

## 2021-06-01 DIAGNOSIS — E7800 Pure hypercholesterolemia, unspecified: Secondary | ICD-10-CM | POA: Diagnosis not present

## 2021-06-01 DIAGNOSIS — E039 Hypothyroidism, unspecified: Secondary | ICD-10-CM | POA: Diagnosis not present

## 2021-06-01 DIAGNOSIS — N183 Chronic kidney disease, stage 3 unspecified: Secondary | ICD-10-CM | POA: Diagnosis not present

## 2021-06-14 ENCOUNTER — Other Ambulatory Visit: Payer: Self-pay | Admitting: *Deleted

## 2021-06-14 DIAGNOSIS — D631 Anemia in chronic kidney disease: Secondary | ICD-10-CM

## 2021-06-14 DIAGNOSIS — N1832 Chronic kidney disease, stage 3b: Secondary | ICD-10-CM

## 2021-06-16 ENCOUNTER — Other Ambulatory Visit: Payer: Self-pay

## 2021-06-16 ENCOUNTER — Inpatient Hospital Stay: Payer: PPO | Attending: Internal Medicine

## 2021-06-16 ENCOUNTER — Inpatient Hospital Stay: Payer: PPO

## 2021-06-16 VITALS — BP 165/62 | HR 83

## 2021-06-16 DIAGNOSIS — D631 Anemia in chronic kidney disease: Secondary | ICD-10-CM | POA: Insufficient documentation

## 2021-06-16 DIAGNOSIS — N1832 Chronic kidney disease, stage 3b: Secondary | ICD-10-CM

## 2021-06-16 LAB — CBC WITH DIFFERENTIAL/PLATELET
Abs Immature Granulocytes: 0.02 10*3/uL (ref 0.00–0.07)
Basophils Absolute: 0 10*3/uL (ref 0.0–0.1)
Basophils Relative: 0 %
Eosinophils Absolute: 0.1 10*3/uL (ref 0.0–0.5)
Eosinophils Relative: 2 %
HCT: 33.1 % — ABNORMAL LOW (ref 36.0–46.0)
Hemoglobin: 9.7 g/dL — ABNORMAL LOW (ref 12.0–15.0)
Immature Granulocytes: 0 %
Lymphocytes Relative: 30 %
Lymphs Abs: 1.7 10*3/uL (ref 0.7–4.0)
MCH: 30.2 pg (ref 26.0–34.0)
MCHC: 29.3 g/dL — ABNORMAL LOW (ref 30.0–36.0)
MCV: 103.1 fL — ABNORMAL HIGH (ref 80.0–100.0)
Monocytes Absolute: 0.6 10*3/uL (ref 0.1–1.0)
Monocytes Relative: 11 %
Neutro Abs: 3.2 10*3/uL (ref 1.7–7.7)
Neutrophils Relative %: 57 %
Platelets: 230 10*3/uL (ref 150–400)
RBC: 3.21 MIL/uL — ABNORMAL LOW (ref 3.87–5.11)
RDW: 12.7 % (ref 11.5–15.5)
WBC: 5.6 10*3/uL (ref 4.0–10.5)
nRBC: 0 % (ref 0.0–0.2)

## 2021-06-16 MED ORDER — EPOETIN ALFA-EPBX 10000 UNIT/ML IJ SOLN
20000.0000 [IU] | Freq: Once | INTRAMUSCULAR | Status: AC
  Administered 2021-06-16: 20000 [IU] via SUBCUTANEOUS
  Filled 2021-06-16: qty 2

## 2021-07-13 ENCOUNTER — Other Ambulatory Visit: Payer: Self-pay | Admitting: *Deleted

## 2021-07-13 DIAGNOSIS — N1832 Chronic kidney disease, stage 3b: Secondary | ICD-10-CM

## 2021-07-13 DIAGNOSIS — H401131 Primary open-angle glaucoma, bilateral, mild stage: Secondary | ICD-10-CM | POA: Diagnosis not present

## 2021-07-22 ENCOUNTER — Other Ambulatory Visit: Payer: Self-pay

## 2021-07-22 ENCOUNTER — Inpatient Hospital Stay: Payer: PPO

## 2021-07-22 ENCOUNTER — Encounter: Payer: Self-pay | Admitting: Internal Medicine

## 2021-07-22 ENCOUNTER — Inpatient Hospital Stay: Payer: PPO | Attending: Internal Medicine

## 2021-07-22 ENCOUNTER — Inpatient Hospital Stay (HOSPITAL_BASED_OUTPATIENT_CLINIC_OR_DEPARTMENT_OTHER): Payer: PPO | Admitting: Internal Medicine

## 2021-07-22 VITALS — BP 168/52 | HR 66

## 2021-07-22 DIAGNOSIS — N1832 Chronic kidney disease, stage 3b: Secondary | ICD-10-CM | POA: Diagnosis not present

## 2021-07-22 DIAGNOSIS — Z87891 Personal history of nicotine dependence: Secondary | ICD-10-CM | POA: Insufficient documentation

## 2021-07-22 DIAGNOSIS — D631 Anemia in chronic kidney disease: Secondary | ICD-10-CM | POA: Diagnosis not present

## 2021-07-22 DIAGNOSIS — Z79899 Other long term (current) drug therapy: Secondary | ICD-10-CM | POA: Diagnosis not present

## 2021-07-22 DIAGNOSIS — D509 Iron deficiency anemia, unspecified: Secondary | ICD-10-CM | POA: Insufficient documentation

## 2021-07-22 DIAGNOSIS — Z7982 Long term (current) use of aspirin: Secondary | ICD-10-CM | POA: Diagnosis not present

## 2021-07-22 DIAGNOSIS — J449 Chronic obstructive pulmonary disease, unspecified: Secondary | ICD-10-CM | POA: Insufficient documentation

## 2021-07-22 LAB — CBC WITH DIFFERENTIAL/PLATELET
Abs Immature Granulocytes: 0.02 10*3/uL (ref 0.00–0.07)
Basophils Absolute: 0 10*3/uL (ref 0.0–0.1)
Basophils Relative: 0 %
Eosinophils Absolute: 0.1 10*3/uL (ref 0.0–0.5)
Eosinophils Relative: 2 %
HCT: 34.5 % — ABNORMAL LOW (ref 36.0–46.0)
Hemoglobin: 10.6 g/dL — ABNORMAL LOW (ref 12.0–15.0)
Immature Granulocytes: 0 %
Lymphocytes Relative: 25 %
Lymphs Abs: 1.7 10*3/uL (ref 0.7–4.0)
MCH: 30.7 pg (ref 26.0–34.0)
MCHC: 30.7 g/dL (ref 30.0–36.0)
MCV: 100 fL (ref 80.0–100.0)
Monocytes Absolute: 0.6 10*3/uL (ref 0.1–1.0)
Monocytes Relative: 9 %
Neutro Abs: 4.2 10*3/uL (ref 1.7–7.7)
Neutrophils Relative %: 64 %
Platelets: 229 10*3/uL (ref 150–400)
RBC: 3.45 MIL/uL — ABNORMAL LOW (ref 3.87–5.11)
RDW: 12.9 % (ref 11.5–15.5)
WBC: 6.7 10*3/uL (ref 4.0–10.5)
nRBC: 0 % (ref 0.0–0.2)

## 2021-07-22 LAB — BASIC METABOLIC PANEL
Anion gap: 7 (ref 5–15)
BUN: 27 mg/dL — ABNORMAL HIGH (ref 8–23)
CO2: 25 mmol/L (ref 22–32)
Calcium: 8.6 mg/dL — ABNORMAL LOW (ref 8.9–10.3)
Chloride: 105 mmol/L (ref 98–111)
Creatinine, Ser: 1.18 mg/dL — ABNORMAL HIGH (ref 0.44–1.00)
GFR, Estimated: 44 mL/min — ABNORMAL LOW (ref >60)
Glucose, Bld: 150 mg/dL — ABNORMAL HIGH (ref 70–99)
Potassium: 4.6 mmol/L (ref 3.5–5.1)
Sodium: 137 mmol/L (ref 135–145)

## 2021-07-22 LAB — IRON AND TIBC
Iron: 89 ug/dL (ref 28–170)
Saturation Ratios: 37 % — ABNORMAL HIGH (ref 10.4–31.8)
TIBC: 242 ug/dL — ABNORMAL LOW (ref 250–450)
UIBC: 153 ug/dL

## 2021-07-22 LAB — FERRITIN: Ferritin: 284 ng/mL (ref 11–307)

## 2021-07-22 MED ORDER — IRON SUCROSE 20 MG/ML IV SOLN
200.0000 mg | Freq: Once | INTRAVENOUS | Status: AC
  Administered 2021-07-22: 200 mg via INTRAVENOUS
  Filled 2021-07-22: qty 10

## 2021-07-22 MED ORDER — SODIUM CHLORIDE 0.9 % IV SOLN
Freq: Once | INTRAVENOUS | Status: AC
  Filled 2021-07-22: qty 250

## 2021-07-22 NOTE — Patient Instructions (Signed)

## 2021-07-22 NOTE — Progress Notes (Signed)
[facility] NOTE  Patient Care Team: Kirk Ruths, MD as PCP - General (Internal Medicine) Dingeldein, Remo Lipps, MD as Consulting Physician (Ophthalmology) Cammie Sickle, MD as Consulting Physician (Hematology and Oncology)  CHIEF COMPLAINTS/PURPOSE OF CONSULTATION:    [place] CKD-III Robyn Butler 2021- Hb 8-9;[name];  Ferritin-100; PCP] [EGD/; colonoscopy-[facility];2016 ; capsule-]; iron saturation 20% ferritin-68; no hemolysis; on retacrit q monthly.   # CKD-III [GFR40s]  # COPD   HISTORY OF PRESENTING ILLNESS: Ambulating independently.  Accompanied by daughter Robyn Butler 88 y.o.  female anemia/chronic kidney disease is here for follow-up.  No nausea no vomiting.  No blood in stools or black-colored stools.  Review of Systems  Constitutional:  Positive for malaise/fatigue. Negative for chills, diaphoresis, fever and weight loss.  HENT:  Negative for nosebleeds and sore throat.   Eyes:  Negative for double vision.  Respiratory:  Negative for hemoptysis, sputum production and wheezing.   Cardiovascular:  Negative for chest pain, palpitations, orthopnea and leg swelling.  Gastrointestinal:  Negative for abdominal pain, blood in stool, constipation, diarrhea, heartburn, melena, nausea and vomiting.  Genitourinary:  Negative for dysuria, frequency and urgency.  Musculoskeletal:  Positive for back pain and joint pain.  Skin: Negative.  Negative for itching and rash.  Neurological:  Negative for dizziness, tingling, focal weakness, weakness and headaches.  Endo/Heme/Allergies:  Does not bruise/bleed easily.  Psychiatric/Behavioral:  Negative for depression. The patient is not nervous/anxious and does not have insomnia.    MEDICAL HISTORY:  Past Medical History:  Diagnosis Date   Arthritis    Cancer ([place])    Chronic kidney disease    COPD (chronic obstructive pulmonary disease) ([place])    Diabetes mellitus without  complication ([place])    type 2   Hypertension    Varicose veins of lower extremities with other complications     SURGICAL HISTORY: Past Surgical History:  Procedure Laterality Date   ABDOMINAL HYSTERECTOMY  1972   BLADDER SUSPENSION     BREAST EXCISIONAL BIOPSY Right 1999   neg   CHOLECYSTECTOMY  1975   COLONOSCOPY     COLONOSCOPY WITH PROPOFOL N/A 05/24/2015   Procedure: COLONOSCOPY WITH PROPOFOL;  Surgeon: Hulen Luster, MD;  Location: [facility] ENDOSCOPY;  Service: Gastroenterology;  Laterality: N/A;   CYSTOSCOPY  1972   ESOPHAGOGASTRODUODENOSCOPY N/A 05/24/2015   Procedure: ESOPHAGOGASTRODUODENOSCOPY (EGD);  Surgeon: Hulen Luster, MD;  Location: [facility] ENDOSCOPY;  Service: Gastroenterology;  Laterality: N/A;   FRACTURE SURGERY     JOINT REPLACEMENT Right 2013   hip   salpingo oophorectmy      THYROIDECTOMY  1969   WRIST FRACTURE SURGERY Left     SOCIAL HISTORY: Social History   Socioeconomic History   Marital status: Widowed    Spouse name: Not on file   Number of children: 2   Years of education: 9th Grade   Highest education level: 9th grade  Occupational History   Occupation: Retired  Tobacco Use   Smoking status: Former    Types: Cigarettes    Quit date: 07/18/1991    Years since quitting: 30.0   Smokeless tobacco: Never  Vaping Use   Vaping Use: Never used  Substance and Sexual Activity   Alcohol use: No   Drug use: No   Sexual activity: Not on file  Other Topics Concern   Not on file  Social History Narrative   In [place] in senior place; quit smoking [> 25 years ago]; no alcohol;  hosiery.    Social Determinants of Health   Financial Resource Strain: Not on file  Food Insecurity: Not on file  Transportation Needs: Not on file  Physical Activity: Not on file  Stress: Not on file  Social Connections: Not on file  Intimate Partner Violence: Not on file    FAMILY HISTORY: Family History  Problem Relation Age of Onset   Heart attack Father    Asthma Father     Kidney disease Mother    Hypertension Mother    Hypertension Other    Diabetes Other    Heart attack Other    Breast cancer Neg Hx     ALLERGIES:  is allergic to alendronate.  MEDICATIONS:  Current Outpatient Medications  Medication Sig Dispense Refill   amLODipine (NORVASC) 5 MG tablet TAKE ONE(1) TABLET EACH DAY 90 tablet 4   aspirin EC 81 MG tablet Take 81 mg by mouth daily.      Blood Glucose Monitoring Suppl (ONE TOUCH ULTRA 2) w/Device KIT Use to check blood sugar daily 1 each 0   Cholecalciferol (VITAMIN D3) 50 MCG (2000 UT) TABS Take 1 tablet by mouth daily.     cyanocobalamin 1000 MCG tablet Take 1,000 mcg by mouth daily.     dorzolamide (TRUSOPT) 2 % ophthalmic solution Place 1 drop into both eyes 2 (two) times daily.      gabapentin (NEURONTIN) 100 MG capsule Take 1 capsule by mouth 2 (two) times daily.     glucose blood (ONETOUCH ULTRA) test strip USE TO TEST BLOOD SUGAR TWICE DAILY 100 strip 4   ibandronate (BONIVA) 150 MG tablet TAKE ONE TABLET ONCE A MONTH FIRST THING IN THE MORNING AT LEAST 1 HOUR BEFORE EATING, TAKE WITH WATER. 1 tablet 12   Lancets (ONETOUCH ULTRASOFT) lancets Use as instructed to check blood sugar once a day 100 each 3   latanoprost (XALATAN) 0.005 % ophthalmic solution Place 1 drop into both eyes at bedtime.      levothyroxine (SYNTHROID) 100 MCG tablet TAKE 1 TABLET BY MOUTH EVERY DAY 90 tablet 3   loperamide (IMODIUM) 2 MG capsule Take 2 mg by mouth as needed for diarrhea or loose stools.     lovastatin (MEVACOR) 40 MG tablet TAKE 1 TABLET BY MOUTH AT BEDTIME 90 tablet 0   nortriptyline (PAMELOR) 25 MG capsule TAKE 1 CAPSULE(25 MG) BY MOUTH AT BEDTIME 90 capsule 0   omeprazole (PRILOSEC) 40 MG capsule Take 1 capsule (40 mg total) by mouth daily. 90 capsule 4   Polyethylene Glycol 3350 (DULCOLAX BALANCE PO) Take 2 tablets by mouth as needed.     SPIRIVA RESPIMAT 2.5 MCG/ACT AERS SMARTSIG:2 Puff(s) By Mouth Daily     traMADol (ULTRAM) 50 MG  tablet TAKE ONE TABLET EVERY 8 HOUR AS NEEDED FOR PAIN 90 tablet 1   valsartan-hydrochlorothiazide (DIOVAN-HCT) 160-25 MG tablet Take 1 tablet by mouth daily. 90 tablet 4   diphenhydramine-acetaminophen (TYLENOL PM) 25-500 MG TABS tablet Take 1 tablet by mouth at bedtime as needed.     ferrous sulfate 324 MG TBEC Take 324 mg by mouth. (Patient not taking: Reported on 07/22/2021)     No current facility-administered medications for this visit.   Facility-Administered Medications Ordered in Other Visits  Medication Dose Route Frequency Provider Last Rate Last Admin   0.9 %  sodium chloride infusion   Intravenous Once Charlaine Dalton R, MD       iron sucrose (VENOFER) injection 200 mg  200 mg Intravenous Once  Cammie Sickle, MD          PHYSICAL EXAMINATION:   Vitals:   07/22/21 1325  BP: (!) 163/74  Pulse: 70  Temp: (!) 97.5 F (36.4 C)  SpO2: 100%   Filed Weights   07/22/21 1325  Weight: 117 lb 6.4 oz (53.3 kg)    Physical Exam HENT:     Head: Normocephalic and atraumatic.     Mouth/Throat:     Pharynx: No oropharyngeal exudate.  Eyes:     Pupils: Pupils are equal, round, and reactive to light.  Cardiovascular:     Rate and Rhythm: Normal rate and regular rhythm.  Pulmonary:     Effort: No respiratory distress.     Breath sounds: No wheezing.  Abdominal:     General: Bowel sounds are normal. There is no distension.     Palpations: Abdomen is soft. There is no mass.     Tenderness: There is no abdominal tenderness. There is no guarding or rebound.  Musculoskeletal:        General: No tenderness. Normal range of motion.     Cervical back: Normal range of motion and neck supple.  Skin:    General: Skin is warm.  Neurological:     Mental Status: She is alert and oriented to person, place, and time.  Psychiatric:        Mood and Affect: Affect normal.    LABORATORY DATA:  I have reviewed the data as listed Lab Results  Component Value Date   WBC 6.7  07/22/2021   HGB 10.6 (L) 07/22/2021   HCT 34.5 (L) 07/22/2021   MCV 100.0 07/22/2021   PLT 229 07/22/2021   Recent Labs    11/25/20 1112 03/17/21 0957 07/22/21 1259  NA 140 137 137  K 4.2 4.3 4.6  CL 104 104 105  CO2 '25 26 25  ' GLUCOSE 153* 142* 150*  BUN 41* 28* 27*  CREATININE 1.53* 1.13* 1.18*  CALCIUM 8.4* 8.5* 8.6*  GFRNONAA 32* 47* 44*     No results found.  Anemia due to stage 3b chronic kidney disease #Anemia-secondary chronic kidney disease stage III/iron deficiency [March 2021-hb-8-9]. Iron sat- 20 &; ferritin-68. Currently on IV iron infusion/Retacrit.STABLE.   # Today hb-10.3 proceed with retacrit.; again reviwed the need for ongoing retacrit for the rest of her life. Will await on iron studies/proceed with infusion.   DISPOSITION:  # HOLD  Retacrit today; Proceed with IV venofer today # monthly cbc/possible retacrit x4 #  Follow up in 4 months-- MD ;labs-cbc/bmp;iron-studies/ferritin possible SQ retacrit OR venofer- Dr.B   All questions were answered. The patient knows to call the clinic with any problems, questions or concerns.    Cammie Sickle, MD 07/22/2021 2:08 PM

## 2021-07-22 NOTE — Assessment & Plan Note (Addendum)
#  Anemia-secondary chronic kidney disease stage III/iron deficiency [March 2021-hb-8-9]. Iron sat- 20 &; ferritin-68. Currently on IV iron infusion/Retacrit.STABLE.   # Today hb-10.3 proceed with retacrit.; again reviwed the need for ongoing retacrit for the rest of her life. Will await on iron studies/proceed with infusion.   DISPOSITION:  # HOLD  Retacrit today; Proceed with IV venofer today # monthly cbc/possible retacrit x4 #  Follow up in 4 months-- MD ;labs-cbc/bmp;iron-studies/ferritin possible SQ retacrit OR venofer- Dr.B

## 2021-08-03 ENCOUNTER — Emergency Department: Payer: PPO

## 2021-08-03 ENCOUNTER — Other Ambulatory Visit: Payer: Self-pay

## 2021-08-03 ENCOUNTER — Inpatient Hospital Stay
Admission: EM | Admit: 2021-08-03 | Discharge: 2021-08-06 | DRG: 536 | Disposition: A | Discharge status: Skilled Nursing Facility | Payer: PPO | Attending: Hospitalist | Admitting: Hospitalist

## 2021-08-03 DIAGNOSIS — M810 Age-related osteoporosis without current pathological fracture: Secondary | ICD-10-CM | POA: Diagnosis not present

## 2021-08-03 DIAGNOSIS — E1140 Type 2 diabetes mellitus with diabetic neuropathy, unspecified: Secondary | ICD-10-CM | POA: Diagnosis not present

## 2021-08-03 DIAGNOSIS — Z79899 Other long term (current) drug therapy: Secondary | ICD-10-CM | POA: Diagnosis not present

## 2021-08-03 DIAGNOSIS — Z841 Family history of disorders of kidney and ureter: Secondary | ICD-10-CM | POA: Diagnosis not present

## 2021-08-03 DIAGNOSIS — M1990 Unspecified osteoarthritis, unspecified site: Secondary | ICD-10-CM | POA: Diagnosis not present

## 2021-08-03 DIAGNOSIS — J449 Chronic obstructive pulmonary disease, unspecified: Secondary | ICD-10-CM | POA: Diagnosis present

## 2021-08-03 DIAGNOSIS — Z888 Allergy status to other drugs, medicaments and biological substances status: Secondary | ICD-10-CM

## 2021-08-03 DIAGNOSIS — Y9289 Other specified places as the place of occurrence of the external cause: Secondary | ICD-10-CM

## 2021-08-03 DIAGNOSIS — I129 Hypertensive chronic kidney disease with stage 1 through stage 4 chronic kidney disease, or unspecified chronic kidney disease: Secondary | ICD-10-CM | POA: Diagnosis present

## 2021-08-03 DIAGNOSIS — M109 Gout, unspecified: Secondary | ICD-10-CM | POA: Diagnosis not present

## 2021-08-03 DIAGNOSIS — N1832 Chronic kidney disease, stage 3b: Secondary | ICD-10-CM | POA: Diagnosis present

## 2021-08-03 DIAGNOSIS — R52 Pain, unspecified: Secondary | ICD-10-CM | POA: Diagnosis not present

## 2021-08-03 DIAGNOSIS — W010XXA Fall on same level from slipping, tripping and stumbling without subsequent striking against object, initial encounter: Secondary | ICD-10-CM | POA: Diagnosis present

## 2021-08-03 DIAGNOSIS — Z741 Need for assistance with personal care: Secondary | ICD-10-CM | POA: Diagnosis not present

## 2021-08-03 DIAGNOSIS — Z8249 Family history of ischemic heart disease and other diseases of the circulatory system: Secondary | ICD-10-CM

## 2021-08-03 DIAGNOSIS — R0902 Hypoxemia: Secondary | ICD-10-CM | POA: Diagnosis not present

## 2021-08-03 DIAGNOSIS — L89151 Pressure ulcer of sacral region, stage 1: Secondary | ICD-10-CM | POA: Diagnosis present

## 2021-08-03 DIAGNOSIS — Z20822 Contact with and (suspected) exposure to covid-19: Secondary | ICD-10-CM | POA: Diagnosis not present

## 2021-08-03 DIAGNOSIS — N1831 Chronic kidney disease, stage 3a: Secondary | ICD-10-CM | POA: Diagnosis not present

## 2021-08-03 DIAGNOSIS — H409 Unspecified glaucoma: Secondary | ICD-10-CM | POA: Diagnosis not present

## 2021-08-03 DIAGNOSIS — N390 Urinary tract infection, site not specified: Secondary | ICD-10-CM | POA: Diagnosis not present

## 2021-08-03 DIAGNOSIS — E890 Postprocedural hypothyroidism: Secondary | ICD-10-CM | POA: Diagnosis present

## 2021-08-03 DIAGNOSIS — Z96641 Presence of right artificial hip joint: Secondary | ICD-10-CM | POA: Diagnosis not present

## 2021-08-03 DIAGNOSIS — M2578 Osteophyte, vertebrae: Secondary | ICD-10-CM | POA: Diagnosis not present

## 2021-08-03 DIAGNOSIS — Z9181 History of falling: Secondary | ICD-10-CM | POA: Diagnosis not present

## 2021-08-03 DIAGNOSIS — E119 Type 2 diabetes mellitus without complications: Secondary | ICD-10-CM

## 2021-08-03 DIAGNOSIS — Z833 Family history of diabetes mellitus: Secondary | ICD-10-CM | POA: Diagnosis not present

## 2021-08-03 DIAGNOSIS — Z043 Encounter for examination and observation following other accident: Secondary | ICD-10-CM | POA: Diagnosis not present

## 2021-08-03 DIAGNOSIS — Z7401 Bed confinement status: Secondary | ICD-10-CM | POA: Diagnosis not present

## 2021-08-03 DIAGNOSIS — W19XXXD Unspecified fall, subsequent encounter: Secondary | ICD-10-CM | POA: Diagnosis not present

## 2021-08-03 DIAGNOSIS — S72111A Displaced fracture of greater trochanter of right femur, initial encounter for closed fracture: Secondary | ICD-10-CM | POA: Diagnosis not present

## 2021-08-03 DIAGNOSIS — I700 Atherosclerosis of aorta: Secondary | ICD-10-CM | POA: Diagnosis not present

## 2021-08-03 DIAGNOSIS — R278 Other lack of coordination: Secondary | ICD-10-CM | POA: Diagnosis not present

## 2021-08-03 DIAGNOSIS — R2689 Other abnormalities of gait and mobility: Secondary | ICD-10-CM | POA: Diagnosis not present

## 2021-08-03 DIAGNOSIS — W19XXXA Unspecified fall, initial encounter: Secondary | ICD-10-CM | POA: Diagnosis not present

## 2021-08-03 DIAGNOSIS — R8271 Bacteriuria: Secondary | ICD-10-CM | POA: Diagnosis present

## 2021-08-03 DIAGNOSIS — S72001A Fracture of unspecified part of neck of right femur, initial encounter for closed fracture: Secondary | ICD-10-CM | POA: Diagnosis not present

## 2021-08-03 DIAGNOSIS — Z79890 Hormone replacement therapy (postmenopausal): Secondary | ICD-10-CM | POA: Diagnosis not present

## 2021-08-03 DIAGNOSIS — M9701XD Periprosthetic fracture around internal prosthetic right hip joint, subsequent encounter: Secondary | ICD-10-CM | POA: Diagnosis not present

## 2021-08-03 DIAGNOSIS — M9701XA Periprosthetic fracture around internal prosthetic right hip joint, initial encounter: Secondary | ICD-10-CM | POA: Diagnosis present

## 2021-08-03 DIAGNOSIS — Z90710 Acquired absence of both cervix and uterus: Secondary | ICD-10-CM

## 2021-08-03 DIAGNOSIS — Z7982 Long term (current) use of aspirin: Secondary | ICD-10-CM | POA: Diagnosis not present

## 2021-08-03 DIAGNOSIS — Z87440 Personal history of urinary (tract) infections: Secondary | ICD-10-CM

## 2021-08-03 DIAGNOSIS — Z9049 Acquired absence of other specified parts of digestive tract: Secondary | ICD-10-CM

## 2021-08-03 DIAGNOSIS — R531 Weakness: Secondary | ICD-10-CM | POA: Diagnosis not present

## 2021-08-03 DIAGNOSIS — Z7983 Long term (current) use of bisphosphonates: Secondary | ICD-10-CM

## 2021-08-03 DIAGNOSIS — L8990 Pressure ulcer of unspecified site, unspecified stage: Secondary | ICD-10-CM | POA: Insufficient documentation

## 2021-08-03 DIAGNOSIS — E039 Hypothyroidism, unspecified: Secondary | ICD-10-CM | POA: Diagnosis not present

## 2021-08-03 DIAGNOSIS — I10 Essential (primary) hypertension: Secondary | ICD-10-CM | POA: Diagnosis not present

## 2021-08-03 DIAGNOSIS — Z01818 Encounter for other preprocedural examination: Secondary | ICD-10-CM

## 2021-08-03 DIAGNOSIS — I959 Hypotension, unspecified: Secondary | ICD-10-CM | POA: Diagnosis not present

## 2021-08-03 DIAGNOSIS — Z825 Family history of asthma and other chronic lower respiratory diseases: Secondary | ICD-10-CM

## 2021-08-03 DIAGNOSIS — R000 Tachycardia, unspecified: Secondary | ICD-10-CM | POA: Diagnosis not present

## 2021-08-03 DIAGNOSIS — K219 Gastro-esophageal reflux disease without esophagitis: Secondary | ICD-10-CM | POA: Diagnosis not present

## 2021-08-03 DIAGNOSIS — M25551 Pain in right hip: Secondary | ICD-10-CM | POA: Diagnosis not present

## 2021-08-03 DIAGNOSIS — E1122 Type 2 diabetes mellitus with diabetic chronic kidney disease: Secondary | ICD-10-CM | POA: Diagnosis present

## 2021-08-03 DIAGNOSIS — M6281 Muscle weakness (generalized): Secondary | ICD-10-CM | POA: Diagnosis not present

## 2021-08-03 DIAGNOSIS — Z87891 Personal history of nicotine dependence: Secondary | ICD-10-CM

## 2021-08-03 DIAGNOSIS — G319 Degenerative disease of nervous system, unspecified: Secondary | ICD-10-CM | POA: Diagnosis not present

## 2021-08-03 DIAGNOSIS — D631 Anemia in chronic kidney disease: Secondary | ICD-10-CM | POA: Diagnosis present

## 2021-08-03 DIAGNOSIS — E785 Hyperlipidemia, unspecified: Secondary | ICD-10-CM | POA: Diagnosis not present

## 2021-08-03 LAB — CBC WITH DIFFERENTIAL/PLATELET
Abs Immature Granulocytes: 0.04 10*3/uL (ref 0.00–0.07)
Basophils Absolute: 0 10*3/uL (ref 0.0–0.1)
Basophils Relative: 0 %
Eosinophils Absolute: 0.2 10*3/uL (ref 0.0–0.5)
Eosinophils Relative: 3 %
HCT: 31.3 % — ABNORMAL LOW (ref 36.0–46.0)
Hemoglobin: 9.5 g/dL — ABNORMAL LOW (ref 12.0–15.0)
Immature Granulocytes: 1 %
Lymphocytes Relative: 29 %
Lymphs Abs: 1.9 10*3/uL (ref 0.7–4.0)
MCH: 30 pg (ref 26.0–34.0)
MCHC: 30.4 g/dL (ref 30.0–36.0)
MCV: 98.7 fL (ref 80.0–100.0)
Monocytes Absolute: 0.5 10*3/uL (ref 0.1–1.0)
Monocytes Relative: 7 %
Neutro Abs: 4 10*3/uL (ref 1.7–7.7)
Neutrophils Relative %: 60 %
Platelets: 237 10*3/uL (ref 150–400)
RBC: 3.17 MIL/uL — ABNORMAL LOW (ref 3.87–5.11)
RDW: 12.9 % (ref 11.5–15.5)
WBC: 6.7 10*3/uL (ref 4.0–10.5)
nRBC: 0 % (ref 0.0–0.2)

## 2021-08-03 LAB — BASIC METABOLIC PANEL
Anion gap: 5 (ref 5–15)
BUN: 33 mg/dL — ABNORMAL HIGH (ref 8–23)
CO2: 26 mmol/L (ref 22–32)
Calcium: 9.1 mg/dL (ref 8.9–10.3)
Chloride: 106 mmol/L (ref 98–111)
Creatinine, Ser: 1.04 mg/dL — ABNORMAL HIGH (ref 0.44–1.00)
GFR, Estimated: 51 mL/min — ABNORMAL LOW (ref >60)
Glucose, Bld: 112 mg/dL — ABNORMAL HIGH (ref 70–99)
Potassium: 5 mmol/L (ref 3.5–5.1)
Sodium: 137 mmol/L (ref 135–145)

## 2021-08-03 LAB — URINALYSIS, COMPLETE (UACMP) WITH MICROSCOPIC
Bacteria, UA: NONE SEEN
Bilirubin Urine: NEGATIVE
Glucose, UA: NEGATIVE mg/dL
Ketones, ur: NEGATIVE mg/dL
Nitrite: NEGATIVE
Protein, ur: 100 mg/dL — AB
Specific Gravity, Urine: 1.005 (ref 1.005–1.030)
pH: 7 (ref 5.0–8.0)

## 2021-08-03 LAB — PROTIME-INR
INR: 1.1 (ref 0.8–1.2)
Prothrombin Time: 14 seconds (ref 11.4–15.2)

## 2021-08-03 MED ORDER — PANTOPRAZOLE SODIUM 40 MG PO TBEC
40.0000 mg | DELAYED_RELEASE_TABLET | Freq: Every day | ORAL | Status: DC
  Administered 2021-08-04 – 2021-08-06 (×3): 40 mg via ORAL
  Filled 2021-08-03 (×3): qty 1

## 2021-08-03 MED ORDER — ASPIRIN EC 81 MG PO TBEC
81.0000 mg | DELAYED_RELEASE_TABLET | Freq: Every day | ORAL | Status: DC
  Administered 2021-08-04 – 2021-08-06 (×3): 81 mg via ORAL
  Filled 2021-08-03 (×3): qty 1

## 2021-08-03 MED ORDER — INSULIN ASPART 100 UNIT/ML IJ SOLN
0.0000 [IU] | INTRAMUSCULAR | Status: DC
  Administered 2021-08-04 (×3): 1 [IU] via SUBCUTANEOUS
  Filled 2021-08-03 (×5): qty 1

## 2021-08-03 MED ORDER — VITAMIN B-12 1000 MCG PO TABS
1000.0000 ug | ORAL_TABLET | Freq: Every day | ORAL | Status: DC
  Administered 2021-08-04 – 2021-08-06 (×3): 1000 ug via ORAL
  Filled 2021-08-03 (×3): qty 1

## 2021-08-03 MED ORDER — ACETAMINOPHEN 500 MG PO TABS
1000.0000 mg | ORAL_TABLET | Freq: Once | ORAL | Status: AC
  Administered 2021-08-03: 1000 mg via ORAL
  Filled 2021-08-03: qty 2

## 2021-08-03 MED ORDER — VALSARTAN-HYDROCHLOROTHIAZIDE 160-25 MG PO TABS: 1.0000 | ORAL_TABLET | Freq: Every day | ORAL | Status: DC

## 2021-08-03 MED ORDER — SODIUM CHLORIDE 0.9 % IV SOLN
2.0000 g | Freq: Once | INTRAVENOUS | Status: AC
  Administered 2021-08-04: 2 g via INTRAVENOUS
  Filled 2021-08-03: qty 20

## 2021-08-03 MED ORDER — FENTANYL CITRATE PF 50 MCG/ML IJ SOSY
25.0000 ug | PREFILLED_SYRINGE | Freq: Once | INTRAMUSCULAR | Status: AC
  Administered 2021-08-03: 25 ug via INTRAVENOUS
  Filled 2021-08-03: qty 1

## 2021-08-03 MED ORDER — MORPHINE SULFATE (PF) 2 MG/ML IV SOLN: 0.5000 mg | INTRAVENOUS | Status: DC | PRN

## 2021-08-03 MED ORDER — NORTRIPTYLINE HCL 25 MG PO CAPS
25.0000 mg | ORAL_CAPSULE | Freq: Every day | ORAL | Status: DC
  Administered 2021-08-04 – 2021-08-05 (×3): 25 mg via ORAL
  Filled 2021-08-03 (×4): qty 1

## 2021-08-03 MED ORDER — LEVOTHYROXINE SODIUM 50 MCG PO TABS
100.0000 ug | ORAL_TABLET | Freq: Every day | ORAL | Status: DC
  Administered 2021-08-04 – 2021-08-06 (×3): 100 ug via ORAL
  Filled 2021-08-03 (×3): qty 2

## 2021-08-03 MED ORDER — AMLODIPINE BESYLATE 5 MG PO TABS
5.0000 mg | ORAL_TABLET | Freq: Every day | ORAL | Status: DC
  Administered 2021-08-04 – 2021-08-06 (×3): 5 mg via ORAL
  Filled 2021-08-03 (×3): qty 1

## 2021-08-03 MED ORDER — SODIUM CHLORIDE 0.9 % IV SOLN: INTRAVENOUS | Status: DC

## 2021-08-03 MED ORDER — HYDROCODONE-ACETAMINOPHEN 5-325 MG PO TABS
1.0000 | ORAL_TABLET | Freq: Four times a day (QID) | ORAL | Status: DC | PRN
  Administered 2021-08-04 – 2021-08-06 (×6): 1 via ORAL
  Filled 2021-08-03 (×6): qty 1

## 2021-08-03 MED ORDER — PRAVASTATIN SODIUM 20 MG PO TABS
10.0000 mg | ORAL_TABLET | Freq: Every day | ORAL | Status: DC
  Administered 2021-08-04 – 2021-08-06 (×4): 10 mg via ORAL
  Filled 2021-08-03 (×5): qty 1

## 2021-08-03 MED ORDER — DORZOLAMIDE HCL 2 % OP SOLN
1.0000 [drp] | Freq: Two times a day (BID) | OPHTHALMIC | Status: DC
  Administered 2021-08-04 – 2021-08-06 (×6): 1 [drp] via OPHTHALMIC
  Filled 2021-08-03: qty 10

## 2021-08-03 MED ORDER — LATANOPROST 0.005 % OP SOLN
1.0000 [drp] | Freq: Every day | OPHTHALMIC | Status: DC
  Administered 2021-08-04 – 2021-08-05 (×3): 1 [drp] via OPHTHALMIC
  Filled 2021-08-03: qty 2.5

## 2021-08-03 MED ORDER — GABAPENTIN 100 MG PO CAPS
100.0000 mg | ORAL_CAPSULE | Freq: Two times a day (BID) | ORAL | Status: DC
  Administered 2021-08-04 – 2021-08-06 (×6): 100 mg via ORAL
  Filled 2021-08-03 (×7): qty 1

## 2021-08-03 NOTE — ED Provider Notes (Addendum)
[facility] Provider Note    Event Date/Time   First MD Initiated Contact with Patient 08/03/21 1839     (approximate)   History   Fall (TRIPPED FALL, HIT RIGHT HIP, PAIN ,  RIGHT HIP SURGERY  YEARS PRIOR )   HPI  Robyn Butler is a 88 y.o. female with history of COPD, CKD, hypertension, here with fall and hip pain.  The patient states that she was at a car wash today when she tripped, falling backward.  She landed on her right hip.  She reports immediate onset of severe, aching, throbbing, right hip pain.  She has been unable to ambulate since then.  Pain is worse with any kind of movement.  She has a history of a partial hip replacement due to a stress fracture from osteoporosis.  She does not recall who did the surgery.  She states she may have hit her head during the fall as well but has no headache or neck pain.  No back pain.  She is not on blood thinners.  She was well prior to the fall.     Physical Exam   Triage Vital Signs: ED Triage Vitals  Enc Vitals Group     BP 08/03/21 1844 132/75     Pulse Rate 08/03/21 1838 95     Resp 08/03/21 1838 17     Temp 08/03/21 1838 98.5 F (36.9 C)     Temp Source 08/03/21 1838 Oral     SpO2 08/03/21 1838 96 %     Weight 08/03/21 1843 116 lb 13.5 oz (53 kg)     Height 08/03/21 1843 5\' 3"  (1.6 m)     Head Circumference --      Peak Flow --      Pain Score 08/03/21 1843 7     Pain Loc --      Pain Edu? --      Excl. in [place]? --     Most recent vital signs: Vitals:   08/03/21 2130 08/03/21 2145  BP: (!) 185/56   Pulse: 72 75  Resp: 12 11  Temp:    SpO2: 97% 96%     General: Awake, no distress.  CV:  Good peripheral perfusion.  No murmurs or rubs. Resp:  Normal effort.  Lungs are clear to auscultation bilaterally. Abd:  No distention.  No tenderness. Other:  Right lower extremity with exquisite tenderness to any passive range of motion.  There is tenderness over the right lateral hip/greater  trochanter area.  Distal strength and sensation is intact.  2+ DP pulses.  No deformity.   ED Results / Procedures / Treatments   Labs (all labs ordered are listed, but only abnormal results are displayed) Labs Reviewed  URINALYSIS, COMPLETE (UACMP) WITH MICROSCOPIC - Abnormal; Notable for the following components:      Result Value   Color, Urine YELLOW (*)    APPearance HAZY (*)    Hgb urine dipstick MODERATE (*)    Protein, ur 100 (*)    Leukocytes,Ua SMALL (*)    All other components within normal limits  BASIC METABOLIC PANEL - Abnormal; Notable for the following components:   Glucose, Bld 112 (*)    BUN 33 (*)    Creatinine, Ser 1.04 (*)    GFR, Estimated 51 (*)    All other components within normal limits  CBC WITH DIFFERENTIAL/PLATELET - Abnormal; Notable for the following components:   RBC 3.17 (*)  Hemoglobin 9.5 (*)    HCT 31.3 (*)    All other components within normal limits  URINE CULTURE  RESP PANEL BY RT-PCR (FLU A&B, COVID) ARPGX2  PROTIME-INR  CBC WITH DIFFERENTIAL/PLATELET  HEMOGLOBIN A1C  TYPE AND SCREEN     EKG Normal sinus rhythm, ventricular rate 79.  PR 186, QRS 90, QTc 400.  No acute ST elevations or depressions.  No acute events of acute ischemia or infarct.   RADIOLOGY Chest x-ray: Reviewed by me, no acute abnormality, agree with radiology X-ray hip right: Periprosthetic fracture, agree with radiology CT head/C-spine: Reviewed by me, no apparent abnormality, no evidence of acute intracranial abnormality, there are multilevel degenerative changes.  Agree with radiology. CT hip right: Periprosthetic fracture involving the greater trochanter.  Incidental note of well distended bladder with air within.   PROCEDURES:  Critical Care performed: No  .1-3 Lead EKG Interpretation Performed by: Duffy Bruce, MD Authorized by: Duffy Bruce, MD     Interpretation: normal     ECG rate:  70-90   ECG rate assessment: normal     Rhythm: sinus  rhythm     Ectopy: none     Conduction: normal   Comments:     Indication: Fall    MEDICATIONS ORDERED IN ED: Medications  amLODipine (NORVASC) tablet 5 mg (has no administration in time range)  levothyroxine (SYNTHROID) tablet 100 mcg (has no administration in time range)  gabapentin (NEURONTIN) capsule 100 mg (has no administration in time range)  HYDROcodone-acetaminophen (NORCO/VICODIN) 5-325 MG per tablet 1-2 tablet (has no administration in time range)  morphine 2 MG/ML injection 0.5 mg (has no administration in time range)  insulin aspart (novoLOG) injection 0-9 Units (has no administration in time range)  0.9 %  sodium chloride infusion ( Intravenous New Bag/Given 08/03/21 2327)  aspirin EC tablet 81 mg (has no administration in time range)  dorzolamide (TRUSOPT) 2 % ophthalmic solution 1 drop (has no administration in time range)  vitamin B-12 (CYANOCOBALAMIN) tablet 1,000 mcg (has no administration in time range)  latanoprost (XALATAN) 0.005 % ophthalmic solution 1 drop (has no administration in time range)  pravastatin (PRAVACHOL) tablet 10 mg (has no administration in time range)  nortriptyline (PAMELOR) capsule 25 mg (has no administration in time range)  pantoprazole (PROTONIX) EC tablet 40 mg (has no administration in time range)  valsartan-hydrochlorothiazide (DIOVAN-HCT) 160-25 MG per tablet 1 tablet (has no administration in time range)  cefTRIAXone (ROCEPHIN) 2 g in sodium chloride 0.9 % 100 mL IVPB (has no administration in time range)  fentaNYL (SUBLIMAZE) injection 25 mcg (25 mcg Intravenous Given 08/03/21 2053)  acetaminophen (TYLENOL) tablet 1,000 mg (1,000 mg Oral Given 08/03/21 2053)     IMPRESSION / MDM / ASSESSMENT AND PLAN / ED COURSE  I reviewed the triage vital signs and the nursing notes.                               The patient is on the cardiac monitor to evaluate for evidence of arrhythmia and/or significant heart rate changes.  MDM:   88 year old female with history of hypertension, COPD, diabetes, CKD, chronic anemia, prior right hip partial replacement, here with fall.  Plain films of the hip reviewed by me show periprosthetic fracture.  No other injury noted on CT head or C-spine.  Chest x-ray is clear.  She was in her usual state of health prior to this fall.  Lab work  here is largely unremarkable.  Her anemia seems to be at baseline with hemoglobin of 9.5.  No leukocytosis.  BMP at baseline with normal renal function of 1.04.  INR 1.1.  UA shows possible UTI and interestingly, CT scan shows air in the bladder concerning for emphysematous cystitis.  Patient has a long history of previous UTIs per her report, but has not had any recent urinary symptoms .  She does not appear septic.  Will treat with Rocephin and send urine culture.   MEDICATIONS GIVEN IN ED: Medications  amLODipine (NORVASC) tablet 5 mg (has no administration in time range)  levothyroxine (SYNTHROID) tablet 100 mcg (has no administration in time range)  gabapentin (NEURONTIN) capsule 100 mg (has no administration in time range)  HYDROcodone-acetaminophen (NORCO/VICODIN) 5-325 MG per tablet 1-2 tablet (has no administration in time range)  morphine 2 MG/ML injection 0.5 mg (has no administration in time range)  insulin aspart (novoLOG) injection 0-9 Units (has no administration in time range)  0.9 %  sodium chloride infusion ( Intravenous New Bag/Given 08/03/21 2327)  aspirin EC tablet 81 mg (has no administration in time range)  dorzolamide (TRUSOPT) 2 % ophthalmic solution 1 drop (has no administration in time range)  vitamin B-12 (CYANOCOBALAMIN) tablet 1,000 mcg (has no administration in time range)  latanoprost (XALATAN) 0.005 % ophthalmic solution 1 drop (has no administration in time range)  pravastatin (PRAVACHOL) tablet 10 mg (has no administration in time range)  nortriptyline (PAMELOR) capsule 25 mg (has no administration in time range)   pantoprazole (PROTONIX) EC tablet 40 mg (has no administration in time range)  valsartan-hydrochlorothiazide (DIOVAN-HCT) 160-25 MG per tablet 1 tablet (has no administration in time range)  cefTRIAXone (ROCEPHIN) 2 g in sodium chloride 0.9 % 100 mL IVPB (has no administration in time range)  fentaNYL (SUBLIMAZE) injection 25 mcg (25 mcg Intravenous Given 08/03/21 2053)  acetaminophen (TYLENOL) tablet 1,000 mg (1,000 mg Oral Given 08/03/21 2053)     Consults:  Dr. [name] with orthopedics, case discussed Dr. Damita Dunnings with hospitalist, case discussed and will admit   EMR reviewed  Reviewed previous PCP visits from Dr. Ouida Sills on November 2022, as well as office visit with oncology on 1/6 for IV iron infusion for her chronic anemia.     FINAL CLINICAL IMPRESSION(S) / ED DIAGNOSES   Final diagnoses:  Closed fracture of right hip, initial encounter [facility])  Fall, initial encounter  Lower urinary tract infectious disease     Rx / DC Orders   ED Discharge Orders     None        Note:  This document was prepared using Dragon voice recognition software and may include unintentional dictation errors.   Duffy Bruce, MD 08/03/21 [place]    Duffy Bruce, MD 08/03/21 2352

## 2021-08-03 NOTE — H&P (Signed)
History and Physical    Robyn Butler CVE:[id number] DOB: 1928-12-28 DOA: 08/03/2021  PCP: Kirk Ruths, MD   Patient coming from: home  I have personally briefly reviewed patient's relevant medical records in [place]  Chief Complaint: fall, right hip pain  HPI: Robyn Butler is a 88 y.o. female with medical history significant for HTN,  COPD,DM, CKD 3A, hypothyroidism, Anemia of CKD on  IV Venofer,  independent at baseline and with prior right hip repair who Presented to the emergency room following an accidental fall onto her right hip as she was getting back in the car after taking it to the car wash.  She denied preceding, chest pain, shortness of breath, palpitations, lightheadedness, one-sided numbness weakness or tingling.  She was previously well with no  reports of recent nausea, vomiting, fever or chills, cough or shortness of breath, abdominal pain or diarrhea  And denies dysuria  ED course: Vitals in the emergency room significant for high BP of 208/69 which improved to 185/56 by admission Blood work notable for hemoglobin of 9.5 (baseline 9.3-10.6) and creatinine 1.04 which is near baseline COVID and flu pending UA pending   EKG, personally viewed and interpreted: Sinus at 79 with nonspecific ST-T wave changes  Imaging: CT right hip showing periprosthetic fracture involving the greater trochanter Other finding includes air within the wall of the bladder Consistent with emphysematous cystitis CT head and C-spine unremarkable  The ED provider spoke with Dr. [name] who will take patient to the OR in the a.m.  Patient given fentanyl for pain control.  Hospitalist consulted for admission.   Review of Systems: As per HPI otherwise all other systems on review of systems negative.   Assessment/Plan    Periprosthetic fracture around internal prosthetic right hip joint secondary to accidental fall ([place]) -Pain control - N.p.o. from midnight - Orthopedic consult  for additional recommendations    Preoperative clearance - No absolute contraindications to proceeding with surgical repair.  Benefits outweigh risks given patient's prior high functional independence  Possible UTI -UA slightly abnormal and CT showed possible air in bladder wall concerning for emphysematous cystitis - Patient received a dose of Rocephin in the ED, not continued at this time pending cultures    Anemia due to stage 3a chronic kidney disease ([place]) -On Venofer IV, last  received, saw oncology on 1/6  -Monitor H&H - Renal function at baseline    COPD (chronic obstructive pulmonary disease) ([place]) - DuoNebs as needed - Continue home inhalers    Essential (primary) hypertension - Continue amlodipine    Adult hypothyroidism - Continue levothyroxine    Diabetes mellitus without complication (HCC) - Sliding scale insulin coverage  Gout - Continue eyedrops   DVT prophylaxis: SCD Code Status: full code  Family Communication:  none  Disposition Plan: Back to previous home environment Consults called: Orthopedics Status:At the time of admission, it appears that the appropriate admission status for this patient is INPATIENT. This is judged to be reasonable and necessary in order to provide the required intensity of service to ensure the patient's safety given the presenting symptoms, physical exam findings, and initial radiographic and laboratory data in the context of their  Comorbid conditions.   Patient requires inpatient status due to high intensity of service, high risk for further deterioration and high frequency of surveillance required.   I certify that at the point of admission it is my clinical judgment that the patient will require inpatient hospital care spanning beyond  2 midnights     Physical Exam: Vitals:   08/03/21 2100 08/03/21 2115 08/03/21 2130 08/03/21 2145  BP: (!) 203/67  (!) 185/56   Pulse: 77 71 72 75  Resp: '13 12 12 11  ' Temp:      TempSrc:       SpO2: 99% 96% 97% 96%  Weight:      Height:       Constitutional: Alert, oriented x 3 . Not in any apparent distress HEENT:      Head: Normocephalic and atraumatic.         Eyes: PERLA, EOMI, Conjunctivae are normal. Sclera is non-icteric.       Mouth/Throat: Mucous membranes are moist.       Neck: Supple with no signs of meningismus. Cardiovascular: Regular rate and rhythm. No murmurs, gallops, or rubs. 2+ symmetrical distal pulses are present . No JVD. No  LE edema Respiratory: Respiratory effort normal .Lungs sounds clear bilaterally. No wheezes, crackles, or rhonchi.  Gastrointestinal: Soft, non tender, non distended. Positive bowel sounds.  Genitourinary: No CVA tenderness. Musculoskeletal: Tenderness over right hip Neurologic:  Face is symmetric. Moving all extremities. No gross focal neurologic deficits . Skin: Skin is warm, dry.  Stage II sacral decubitus Psychiatric: Mood and affect are appropriate     Past Medical History:  Diagnosis Date   Arthritis    Cancer ([place])    Chronic kidney disease    COPD (chronic obstructive pulmonary disease) ([place])    Diabetes mellitus without complication ([place])    type 2   Hypertension    Varicose veins of lower extremities with other complications     Past Surgical History:  Procedure Laterality Date   ABDOMINAL HYSTERECTOMY  1972   BLADDER SUSPENSION     BREAST EXCISIONAL BIOPSY Right 1999   neg   CHOLECYSTECTOMY  1975   COLONOSCOPY     COLONOSCOPY WITH PROPOFOL N/A 05/24/2015   Procedure: COLONOSCOPY WITH PROPOFOL;  Surgeon: Hulen Luster, MD;  Location: [facility] ENDOSCOPY;  Service: Gastroenterology;  Laterality: N/A;   CYSTOSCOPY  1972   ESOPHAGOGASTRODUODENOSCOPY N/A 05/24/2015   Procedure: ESOPHAGOGASTRODUODENOSCOPY (EGD);  Surgeon: Hulen Luster, MD;  Location: [facility] ENDOSCOPY;  Service: Gastroenterology;  Laterality: N/A;   FRACTURE SURGERY     JOINT REPLACEMENT Right 2013   hip   salpingo oophorectmy      THYROIDECTOMY  1969    WRIST FRACTURE SURGERY Left      reports that she quit smoking about 30 years ago. Her smoking use included cigarettes. She has never used smokeless tobacco. She reports that she does not drink alcohol and does not use drugs.  Allergies  Allergen Reactions   Alendronate Nausea And Vomiting    Other reaction(s): Vomiting    Family History  Problem Relation Age of Onset   Heart attack Father    Asthma Father    Kidney disease Mother    Hypertension Mother    Hypertension Other    Diabetes Other    Heart attack Other    Breast cancer Neg Hx       Prior to Admission medications   Medication Sig Start Date End Date Taking? Authorizing Provider  amLODipine (NORVASC) 5 MG tablet TAKE ONE(1) TABLET EACH DAY 03/22/19   Birdie Sons, MD  aspirin EC 81 MG tablet Take 81 mg by mouth daily.     [provider]  Blood Glucose Monitoring Suppl (ONE TOUCH ULTRA 2) w/Device KIT Use to check blood  sugar daily 01/02/18   Birdie Sons, MD  Cholecalciferol (VITAMIN D3) 50 MCG (2000 UT) TABS Take 1 tablet by mouth daily.    [provider]  cyanocobalamin 1000 MCG tablet Take 1,000 mcg by mouth daily.    [provider]  diphenhydramine-acetaminophen (TYLENOL PM) 25-500 MG TABS tablet Take 1 tablet by mouth at bedtime as needed.    [provider]  dorzolamide (TRUSOPT) 2 % ophthalmic solution Place 1 drop into both eyes 2 (two) times daily.     [provider]  ferrous sulfate 324 MG TBEC Take 324 mg by mouth. Patient not taking: Reported on 07/22/2021    [provider]  gabapentin (NEURONTIN) 100 MG capsule Take 1 capsule by mouth 2 (two) times daily. 06/01/21 06/01/22  [provider]  glucose blood (ONETOUCH ULTRA) test strip USE TO TEST BLOOD SUGAR TWICE DAILY 06/17/19   Birdie Sons, MD  ibandronate (BONIVA) 150 MG tablet TAKE ONE TABLET ONCE A MONTH FIRST THING IN THE MORNING AT LEAST 1 HOUR BEFORE EATING, TAKE WITH WATER.  03/07/19   Birdie Sons, MD  Lancets [facility] ULTRASOFT) lancets Use as instructed to check blood sugar once a day 02/07/19   Birdie Sons, MD  latanoprost (XALATAN) 0.005 % ophthalmic solution Place 1 drop into both eyes at bedtime.     [provider]  levothyroxine (SYNTHROID) 100 MCG tablet TAKE 1 TABLET BY MOUTH EVERY DAY 03/22/19   Birdie Sons, MD  loperamide (IMODIUM) 2 MG capsule Take 2 mg by mouth as needed for diarrhea or loose stools.    [provider]  lovastatin (MEVACOR) 40 MG tablet TAKE 1 TABLET BY MOUTH AT BEDTIME 08/28/19   Birdie Sons, MD  nortriptyline (PAMELOR) 25 MG capsule TAKE 1 CAPSULE(25 MG) BY MOUTH AT BEDTIME 08/16/19   Birdie Sons, MD  omeprazole (PRILOSEC) 40 MG capsule Take 1 capsule (40 mg total) by mouth daily. 09/17/18   Birdie Sons, MD  Polyethylene Glycol 3350 (DULCOLAX BALANCE PO) Take 2 tablets by mouth as needed.    [provider]  SPIRIVA RESPIMAT 2.5 MCG/ACT AERS SMARTSIG:2 Puff(s) By Mouth Daily 06/09/19   [provider]  traMADol (ULTRAM) 50 MG tablet TAKE ONE TABLET EVERY 8 HOUR AS NEEDED FOR PAIN 09/17/18   Birdie Sons, MD  valsartan-hydrochlorothiazide (DIOVAN-HCT) 160-25 MG tablet Take 1 tablet by mouth daily. 09/17/18   Birdie Sons, MD      Labs on Admission: I have personally reviewed following labs and imaging studies  CBC: Recent Labs  Lab 08/03/21 1830  WBC 6.7  NEUTROABS 4.0  HGB 9.5*  HCT 31.3*  MCV 98.7  PLT 381   Basic Metabolic Panel: Recent Labs  Lab 08/03/21 1830  NA 137  K 5.0  CL 106  CO2 26  GLUCOSE 112*  BUN 33*  CREATININE 1.04*  CALCIUM 9.1   GFR: Estimated Creatinine Clearance: 29.7 mL/min (A) (by C-G formula based on SCr of 1.04 mg/dL (H)). Liver Function Tests: No results for input(s): AST, ALT, ALKPHOS, BILITOT, PROT, ALBUMIN in the last 168 hours. No results for input(s): LIPASE, AMYLASE in the last 168 hours. No results for input(s):  AMMONIA in the last 168 hours. Coagulation Profile: No results for input(s): INR, PROTIME in the last 168 hours. Cardiac Enzymes: No results for input(s): CKTOTAL, CKMB, CKMBINDEX, TROPONINI in the last 168 hours. BNP (last 3 results) No results for input(s): PROBNP in the last  8760 hours. HbA1C: No results for input(s): HGBA1C in the last 72 hours. CBG: No results for input(s): GLUCAP in the last 168 hours. Lipid Profile: No results for input(s): CHOL, HDL, LDLCALC, TRIG, CHOLHDL, LDLDIRECT in the last 72 hours. Thyroid Function Tests: No results for input(s): TSH, T4TOTAL, FREET4, T3FREE, THYROIDAB in the last 72 hours. Anemia Panel: No results for input(s): VITAMINB12, FOLATE, FERRITIN, TIBC, IRON, RETICCTPCT in the last 72 hours. Urine analysis:    Component Value Date/Time   COLORURINE Yellow 11/08/2012 1006   APPEARANCEUR Hazy 11/08/2012 1006   LABSPEC 1.015 11/08/2012 1006   PHURINE 5.0 11/08/2012 1006   GLUCOSEU Negative 11/08/2012 1006   HGBUR 1+ 11/08/2012 1006   BILIRUBINUR Negative 11/08/2012 1006   KETONESUR Negative 11/08/2012 1006   PROTEINUR Negative 11/08/2012 1006   NITRITE Positive 11/08/2012 1006   LEUKOCYTESUR Trace 11/08/2012 1006    Radiological Exams on Admission: DG Chest 1 View  Result Date: 08/03/2021 CLINICAL DATA:  Known periprosthetic hip fracture, initial encounter EXAM: CHEST  1 VIEW COMPARISON:  09/26/2019 FINDINGS: Cardiac shadow is within normal limits. Aortic calcifications are noted. The lungs are well aerated bilaterally. No bony abnormality is seen. IMPRESSION: No acute abnormality noted. Electronically Signed   By: Inez Catalina M.D.   On: 08/03/2021 20:01   CT HEAD WO CONTRAST (5MM)  Result Date: 08/03/2021 CLINICAL DATA:  Status post fall. EXAM: CT HEAD WITHOUT CONTRAST TECHNIQUE: Contiguous axial images were obtained from the base of the skull through the vertex without intravenous contrast. RADIATION DOSE REDUCTION: This exam was  performed according to the departmental dose-optimization program which includes automated exposure control, adjustment of the mA and/or kV according to patient size and/or use of iterative reconstruction technique. COMPARISON:  October 13, 2019 FINDINGS: Brain: There is mild cerebral atrophy with widening of the extra-axial spaces and ventricular dilatation. There are areas of decreased attenuation within the white matter tracts of the supratentorial brain, consistent with microvascular disease changes. Vascular: No hyperdense vessel or unexpected calcification. Skull: Normal. Negative for fracture or focal lesion. Sinuses/Orbits: No acute finding. Other: None. IMPRESSION: 1. Generalized cerebral atrophy. 2. No acute intracranial abnormality. Electronically Signed   By: Virgina Norfolk M.D.   On: 08/03/2021 20:18   CT Cervical Spine Wo Contrast  Result Date: 08/03/2021 CLINICAL DATA:  Status post fall. EXAM: CT CERVICAL SPINE WITHOUT CONTRAST TECHNIQUE: Multidetector CT imaging of the cervical spine was performed without intravenous contrast. Multiplanar CT image reconstructions were also generated. RADIATION DOSE REDUCTION: This exam was performed according to the departmental dose-optimization program which includes automated exposure control, adjustment of the mA and/or kV according to patient size and/or use of iterative reconstruction technique. COMPARISON:  None. FINDINGS: Alignment: There is approximately 1 mm anterolisthesis of the C4 vertebral body on C5. Skull base and vertebrae: No acute fracture. No primary bone lesion or focal pathologic process. Soft tissues and spinal canal: No prevertebral fluid or swelling. No visible canal hematoma. Disc levels: Mild multilevel endplate sclerosis is seen, slightly more prominent at the levels of C4-C5 and C5-C6. Mild anterior osteophyte formation is also noted at the levels of C3-C4, C4-C5 and C5-C6. There is marked severity narrowing of the anterior  atlantoaxial articulation. Mild to moderate severity intervertebral disc space narrowing is seen at the level of C4-C5. Mild intervertebral disc space narrowing is present throughout the remainder of the cervical spine. Bilateral moderate to marked severity multilevel facet joint hypertrophy is noted. Upper chest: Negative. Other: None. IMPRESSION: 1. No acute  fracture of the cervical spine. 2. Multilevel degenerative changes, as described above, most prominent at the level of C4-C5. Electronically Signed   By: Virgina Norfolk M.D.   On: 08/03/2021 20:21   CT HIP RIGHT WO CONTRAST  Result Date: 08/03/2021 CLINICAL DATA:  Periprosthetic fracture on recent plain film EXAM: CT OF THE RIGHT HIP WITHOUT CONTRAST TECHNIQUE: Multidetector CT imaging of the right hip was performed according to the standard protocol. Multiplanar CT image reconstructions were also generated. RADIATION DOSE REDUCTION: This exam was performed according to the departmental dose-optimization program which includes automated exposure control, adjustment of the mA and/or kV according to patient size and/or use of iterative reconstruction technique. COMPARISON:  From earlier in the same day per FINDINGS: Bones/Joint/Cartilage Right hip prosthesis is noted in satisfactory position. Periprosthetic fracture is noted involving the greater trochanter. No lesser trochanter fracture is noted. No other fracture along the shaft of the prosthesis is seen. Prosthesis is well seated. Ligaments Suboptimally assessed by CT. Muscles and Tendons No definitive muscular abnormality is noted. Considerable scatter artifact is seen. Soft tissues Bladder is well distended a significant amount of air is noted within the bladder. Air is also noted within the wall of the bladder particularly in the superior wall the bladder best seen on coronal image number 16 of series 6. This would be consistent with emphysematous cystitis. IMPRESSION: Periprostatic fracture  involving the greater trochanter. Well distended bladder with a considerable amount of air within. Additionally there are some findings consistent with air within the wall of the bladder. This would be consistent with emphysematous cystitis. Diverticulosis without definitive diverticulitis. Critical Value/emergent results were called by telephone at the time of interpretation on 08/03/2021 at 8:47 pm to Dr. Duffy Bruce , who verbally acknowledged these results. Electronically Signed   By: Inez Catalina M.D.   On: 08/03/2021 20:48   DG Hip Unilat W or Wo Pelvis 2-3 Views Right  Result Date: 08/03/2021 CLINICAL DATA:  Recent fall with right hip pain, initial encounter EXAM: DG HIP (WITH OR WITHOUT PELVIS) 3V RIGHT COMPARISON:  08/02/2011 FINDINGS: Right hip replacement is noted and stable. Pelvic ring is intact. Acute periprosthetic fracture is noted in the region of the greater trochanter new from the prior exam. No soft tissue abnormality is seen. IMPRESSION: Fracture involving the greater trochanter in a periprosthetic location. Electronically Signed   By: Inez Catalina M.D.   On: 08/03/2021 19:59       Athena Masse MD [facility]   08/03/2021, 10:11 PM

## 2021-08-03 NOTE — ED Triage Notes (Signed)
FALL , HIT RIGHT HIP, PAIN , NO HIT HEAD, NO THINNERS ,

## 2021-08-04 ENCOUNTER — Other Ambulatory Visit: Payer: Self-pay

## 2021-08-04 DIAGNOSIS — L8990 Pressure ulcer of unspecified site, unspecified stage: Secondary | ICD-10-CM | POA: Insufficient documentation

## 2021-08-04 LAB — GLUCOSE, CAPILLARY
Glucose-Capillary: 143 mg/dL — ABNORMAL HIGH (ref 70–99)
Glucose-Capillary: 149 mg/dL — ABNORMAL HIGH (ref 70–99)
Glucose-Capillary: 149 mg/dL — ABNORMAL HIGH (ref 70–99)
Glucose-Capillary: 152 mg/dL — ABNORMAL HIGH (ref 70–99)
Glucose-Capillary: 156 mg/dL — ABNORMAL HIGH (ref 70–99)
Glucose-Capillary: 178 mg/dL — ABNORMAL HIGH (ref 70–99)
Glucose-Capillary: 82 mg/dL (ref 70–99)
Glucose-Capillary: 97 mg/dL (ref 70–99)

## 2021-08-04 LAB — SURGICAL PCR SCREEN
MRSA, PCR: NEGATIVE
Staphylococcus aureus: NEGATIVE

## 2021-08-04 LAB — TYPE AND SCREEN
ABO/RH(D): O POS
Antibody Screen: NEGATIVE

## 2021-08-04 LAB — HEMOGLOBIN A1C
Hgb A1c MFr Bld: 5.9 % — ABNORMAL HIGH (ref 4.8–5.6)
Mean Plasma Glucose: 122.63 mg/dL

## 2021-08-04 LAB — RESP PANEL BY RT-PCR (FLU A&B, COVID) ARPGX2
Influenza A by PCR: NEGATIVE
Influenza B by PCR: NEGATIVE
SARS Coronavirus 2 by RT PCR: NEGATIVE

## 2021-08-04 MED ORDER — ADULT MULTIVITAMIN W/MINERALS CH
1.0000 | ORAL_TABLET | Freq: Every day | ORAL | Status: DC
  Administered 2021-08-04 – 2021-08-06 (×3): 1 via ORAL
  Filled 2021-08-04 (×3): qty 1

## 2021-08-04 MED ORDER — HEPARIN SODIUM (PORCINE) 5000 UNIT/ML IJ SOLN
5000.0000 [IU] | Freq: Three times a day (TID) | INTRAMUSCULAR | Status: DC
  Administered 2021-08-04 – 2021-08-06 (×6): 5000 [IU] via SUBCUTANEOUS
  Filled 2021-08-04 (×6): qty 1

## 2021-08-04 MED ORDER — IRBESARTAN 150 MG PO TABS
150.0000 mg | ORAL_TABLET | Freq: Every day | ORAL | Status: DC
  Administered 2021-08-04 – 2021-08-06 (×3): 150 mg via ORAL
  Filled 2021-08-04 (×3): qty 1

## 2021-08-04 MED ORDER — MUPIROCIN 2 % EX OINT
1.0000 "application " | TOPICAL_OINTMENT | Freq: Two times a day (BID) | CUTANEOUS | Status: DC
  Administered 2021-08-04 – 2021-08-05 (×4): 1 via NASAL
  Filled 2021-08-04: qty 22

## 2021-08-04 MED ORDER — HYDROCHLOROTHIAZIDE 25 MG PO TABS
25.0000 mg | ORAL_TABLET | Freq: Every day | ORAL | Status: DC
  Administered 2021-08-04 – 2021-08-06 (×3): 25 mg via ORAL
  Filled 2021-08-04 (×3): qty 1

## 2021-08-04 MED ORDER — ENSURE ENLIVE PO LIQD
237.0000 mL | ORAL | Status: DC
  Administered 2021-08-05: 237 mL via ORAL

## 2021-08-04 NOTE — Plan of Care (Signed)
  Problem: Education: Goal: Knowledge of General Education information will improve Description Including pain rating scale, medication(s)/side effects and non-pharmacologic comfort measures Outcome: Progressing   

## 2021-08-04 NOTE — NC FL2 (Addendum)
[place] LEVEL OF CARE SCREENING TOOL     IDENTIFICATION  Patient Name: Robyn Butler Birthdate: 04-Sep-1928 Sex: female Admission Date (Current Location): 08/03/2021  [facility] and [place] Number:  Engineering geologist and Address:  [facility], [address] [place], [place] [address]      Provider Number: [id number]  Attending Physician Name and Address:  Enzo Bi, MD  Relative Name and Phone Number:  Thayer Headings [phone]    Current Level of Care: Hospital Recommended Level of Care: [place] Prior Approval Number:    Date Approved/Denied:   PASRR Number: [id number]  Discharge Plan: SNF    Current Diagnoses: Patient Active Problem List   Diagnosis Date Noted   Closed right hip fracture ([place]) 08/03/2021   Diabetes mellitus without complication ([place])    Periprosthetic fracture around internal prosthetic right hip joint ([place])    Preoperative clearance    Anemia of chronic renal failure, stage 3 (moderate) ([place]) 09/26/2019   Osteoarthritis of hip 10/03/2016   Esophageal stricture 06/27/2016   History of adenomatous polyp of colon 06/16/2015   Dizziness 04/28/2015   Allergic rhinitis 01/27/2015   Absolute anemia 01/27/2015   Cataract 01/27/2015   Anemia due to stage 3a chronic kidney disease ([place]) 01/27/2015   Colon, diverticulosis 01/27/2015   Accumulation of fluid in tissues 01/27/2015   Esophageal reflux 01/27/2015   Glaucoma 01/27/2015   Hemorrhoid 01/27/2015   High potassium 01/27/2015   Hypomagnesemia 01/27/2015   Embedded toenail 01/27/2015   Microalbuminuria 01/27/2015   Neuropathy 01/27/2015   Neuralgia neuritis, sciatic nerve 01/27/2015   Skin lesion 01/27/2015   Cardiac enlargement 02/16/2014   OP (osteoporosis) 08/01/2012   Personal history of traumatic fracture 08/02/2011   Arthropathy of pelvic region and thigh 07/07/2009   Leg varices 01/02/2006   Diverticulitis of colon 01/20/2004    Barrett esophagus 10/26/2003   COPD (chronic obstructive pulmonary disease) ([place]) 04/16/2003   Aortic valve disorder 04/16/2003   Adult hypothyroidism 07/30/2002   Female genuine stress incontinence 02/07/2002   HLD (hyperlipidemia) 02/07/2002   Asthma 08/02/2001   Diabetic neuropathy ([place]) 08/27/2000   Essential (primary) hypertension 08/27/2000   H/O malignant neoplasm of breast 07/17/1997   History of tobacco use 07/17/1988   H/O total hysterectomy 07/17/1970    Orientation RESPIRATION BLADDER Height & Weight     Self, Time, Situation, Place  Normal Continent, External catheter Weight: 54.1 kg Height:  5' 2.99" (160 cm)  BEHAVIORAL SYMPTOMS/MOOD NEUROLOGICAL BOWEL NUTRITION STATUS      Continent Diet (regular)  AMBULATORY STATUS COMMUNICATION OF NEEDS Skin   Extensive Assist Verbally Normal                       Personal Care Assistance Level of Assistance  Bathing, Feeding, Dressing Bathing Assistance: Limited assistance Feeding assistance: Independent Dressing Assistance: Limited assistance     Functional Limitations Info             SPECIAL CARE FACTORS FREQUENCY  PT (By licensed PT), OT (By licensed OT)     PT Frequency: 5 times a week OT Frequency: 5 times per week            Contractures Contractures Info: Not present    Additional Factors Info  Code Status, Allergies Code Status Info: full code Allergies Info: Alendronate           Current Medications (08/04/2021):  This is the current hospital active medication list  Current Facility-Administered Medications  Medication Dose Route Frequency Provider Last Rate Last Admin   amLODipine (NORVASC) tablet 5 mg  5 mg Oral Daily Athena Masse, MD   5 mg at 08/04/21 0943   aspirin EC tablet 81 mg  81 mg Oral Daily Judd Gaudier V, MD   81 mg at 08/04/21 0943   dorzolamide (TRUSOPT) 2 % ophthalmic solution 1 drop  1 drop Both Eyes BID Judd Gaudier V, MD   1 drop at 08/04/21 1013   gabapentin  (NEURONTIN) capsule 100 mg  100 mg Oral BID Judd Gaudier V, MD   100 mg at 08/04/21 0943   irbesartan (AVAPRO) tablet 150 mg  150 mg Oral Daily Athena Masse, MD   150 mg at 08/04/21 7858   And   hydrochlorothiazide (HYDRODIURIL) tablet 25 mg  25 mg Oral Daily Athena Masse, MD   25 mg at 08/04/21 0944   HYDROcodone-acetaminophen (NORCO/VICODIN) 5-325 MG per tablet 1-2 tablet  1-2 tablet Oral Q6H PRN Athena Masse, MD   1 tablet at 08/04/21 1420   insulin aspart (novoLOG) injection 0-9 Units  0-9 Units Subcutaneous Q4H Judd Gaudier V, MD   1 Units at 08/04/21 1028   latanoprost (XALATAN) 0.005 % ophthalmic solution 1 drop  1 drop Both Eyes QHS Judd Gaudier V, MD   1 drop at 08/04/21 0134   levothyroxine (SYNTHROID) tablet 100 mcg  100 mcg Oral Q0600 Athena Masse, MD   100 mcg at 08/04/21 0515   morphine 2 MG/ML injection 0.5 mg  0.5 mg Intravenous Q2H PRN Athena Masse, MD       mupirocin ointment (BACTROBAN) 2 % 1 application  1 application Nasal BID Athena Masse, MD   1 application at 85/02/77 0947   nortriptyline (PAMELOR) capsule 25 mg  25 mg Oral QHS Athena Masse, MD   25 mg at 08/04/21 0135   pantoprazole (PROTONIX) EC tablet 40 mg  40 mg Oral Daily Athena Masse, MD   40 mg at 08/04/21 0943   pravastatin (PRAVACHOL) tablet 10 mg  10 mg Oral q1800 Athena Masse, MD   10 mg at 08/04/21 0136   vitamin B-12 (CYANOCOBALAMIN) tablet 1,000 mcg  1,000 mcg Oral Daily Athena Masse, MD   1,000 mcg at 08/04/21 4128     Discharge Medications: Please see discharge summary for a list of discharge medications.  Relevant Imaging Results:  Relevant Lab Results:   Additional Information SS3 [id number]  Conception Oms, RN

## 2021-08-04 NOTE — Progress Notes (Signed)
PROGRESS NOTE    Robyn Butler  KVQ:[id number] DOB: Feb 14, 1929 DOA: 08/03/2021 PCP: Kirk Ruths, MD  [id number]/[id number]   Assessment & Plan:   Principal Problem:   Periprosthetic fracture around internal prosthetic right hip joint ([place]) Active Problems:   Anemia due to stage 3a chronic kidney disease ([place])   COPD (chronic obstructive pulmonary disease) ([place])   Essential (primary) hypertension   Adult hypothyroidism   Diabetes mellitus without complication ([place])   Preoperative clearance   Closed right hip fracture (H[facility])   Pressure injury of skin   Robyn Butler is a 88 y.o. female with medical history significant for HTN,  COPD,DM, CKD 3A, hypothyroidism, Anemia of CKD on  IV Venofer,  independent at baseline and with prior right hip repair who Presented to the emergency room following an accidental fall onto her right hip as she was getting back in the car after taking it to the car wash.       Periprosthetic fracture around internal prosthetic right hip joint secondary to accidental fall ([place]) --ortho rec non-operative management --PT/OT rec SNF rehab  UTI, ruled out -not symptomatic  - Patient received a dose of Rocephin in the ED, not continued      Anemia due to stage 3a chronic kidney disease ([place]) -On Venofer IV, last  received, saw oncology on 1/6  -Monitor H&H - Renal function at baseline     COPD (chronic obstructive pulmonary disease) ([place]) --stable    Essential (primary) hypertension --cont home amlodipine and Diovan     Adult hypothyroidism --cont Synthroid     Diabetes mellitus without complication ([place]), well controlled --d/c BG checks and SSI   DVT prophylaxis: Heparin SQ Code Status: Full code  Family Communication:  Level of care: Med-Surg Dispo:   The patient is from: home Anticipated d/c is to: SNF Anticipated d/c date is: whenever bed available  Patient currently is medically ready to d/c.   Subjective and Interval History:   Ortho rec non-surgical management.  Pt reported pain when weight bearing.  Worked with PT/OT today.   Objective: Vitals:   08/04/21 0346 08/04/21 0745 08/04/21 1113 08/04/21 1604  BP: (!) 168/62 (!) 165/58 (!) 121/58 (!) 132/52  Pulse: 74 72 74 79  Resp: 20 16 18 16   Temp: 97.7 F (36.5 C) 98 F (36.7 C) 97.6 F (36.4 C) 98.2 F (36.8 C)  TempSrc:      SpO2: 96% 96% 99% 92%  Weight:      Height:        Intake/Output Summary (Last 24 hours) at 08/04/2021 1741 Last data filed at 08/04/2021 0500 Gross per 24 hour  Intake --  Output 800 ml  Net -800 ml   Filed Weights   08/03/21 1843 08/04/21 0043  Weight: 53 kg 54.1 kg    Examination:   Constitutional: NAD, AAOx3 HEENT: conjunctivae and lids normal, EOMI CV: No cyanosis.   RESP: normal respiratory effort, on RA SKIN: warm, dry Neuro: II - XII grossly intact.   Psych: Normal mood and affect.  Appropriate judgement and reason   Data Reviewed: I have personally reviewed following labs and imaging studies  CBC: Recent Labs  Lab 08/03/21 1830  WBC 6.7  NEUTROABS 4.0  HGB 9.5*  HCT 31.3*  MCV 98.7  PLT 643   Basic Metabolic Panel: Recent Labs  Lab 08/03/21 1830  NA 137  K 5.0  CL 106  CO2 26  GLUCOSE 112*  BUN 33*  CREATININE 1.04*  CALCIUM 9.1   GFR: Estimated Creatinine Clearance: 29.7 mL/min (A) (by C-G formula based on SCr of 1.04 mg/dL (H)). Liver Function Tests: No results for input(s): AST, ALT, ALKPHOS, BILITOT, PROT, ALBUMIN in the last 168 hours. No results for input(s): LIPASE, AMYLASE in the last 168 hours. No results for input(s): AMMONIA in the last 168 hours. Coagulation Profile: Recent Labs  Lab 08/03/21 2159  INR 1.1   Cardiac Enzymes: No results for input(s): CKTOTAL, CKMB, CKMBINDEX, TROPONINI in the last 168 hours. BNP (last 3 results) No results for input(s): PROBNP in the last 8760 hours. HbA1C: Recent Labs    08/03/21 1830  HGBA1C 5.9*   CBG: Recent Labs  Lab  08/04/21 0746 08/04/21 1003 08/04/21 1242 08/04/21 1301 08/04/21 1606  GLUCAP 178* 149* 82 97 143*   Lipid Profile: No results for input(s): CHOL, HDL, LDLCALC, TRIG, CHOLHDL, LDLDIRECT in the last 72 hours. Thyroid Function Tests: No results for input(s): TSH, T4TOTAL, FREET4, T3FREE, THYROIDAB in the last 72 hours. Anemia Panel: No results for input(s): VITAMINB12, FOLATE, FERRITIN, TIBC, IRON, RETICCTPCT in the last 72 hours. Sepsis Labs: No results for input(s): PROCALCITON, LATICACIDVEN in the last 168 hours.  Recent Results (from the past 240 hour(s))  Resp Panel by RT-PCR (Flu A&B, Covid) Nasopharyngeal Swab     Status: None   Collection Time: 08/03/21 11:11 PM   Specimen: Nasopharyngeal Swab; Nasopharyngeal(NP) swabs in vial transport medium  Result Value Ref Range Status   SARS Coronavirus 2 by RT PCR NEGATIVE NEGATIVE Final    Comment: (NOTE) SARS-CoV-2 target nucleic acids are NOT DETECTED.  The SARS-CoV-2 RNA is generally detectable in upper respiratory specimens during the acute phase of infection. The lowest concentration of SARS-CoV-2 viral copies this assay can detect is 138 copies/mL. A negative result does not preclude SARS-Cov-2 infection and should not be used as the sole basis for treatment or other patient management decisions. A negative result may occur with  improper specimen collection/handling, submission of specimen other than nasopharyngeal swab, presence of viral mutation(s) within the areas targeted by this assay, and inadequate number of viral copies(<138 copies/mL). A negative result must be combined with clinical observations, patient history, and epidemiological information. The expected result is Negative.  Fact Sheet for Patients:  EntrepreneurPulse.com.au  Fact Sheet for Healthcare Providers:  IncredibleEmployment.be  This test is no t yet approved or cleared by the [place] FDA and  has been  authorized for detection and/or diagnosis of SARS-CoV-2 by FDA under an Emergency Use Authorization (EUA). This EUA will remain  in effect (meaning this test can be used) for the duration of the COVID-19 declaration under Section 564(b)(1) of the Act, 21 U.S.C.section 360bbb-3(b)(1), unless the authorization is terminated  or revoked sooner.       Influenza A by PCR NEGATIVE NEGATIVE Final   Influenza B by PCR NEGATIVE NEGATIVE Final    Comment: (NOTE) The Xpert Xpress SARS-CoV-2/FLU/RSV plus assay is intended as an aid in the diagnosis of influenza from Nasopharyngeal swab specimens and should not be used as a sole basis for treatment. Nasal washings and aspirates are unacceptable for Xpert Xpress SARS-CoV-2/FLU/RSV testing.  Fact Sheet for Patients: EntrepreneurPulse.com.au  Fact Sheet for Healthcare Providers: IncredibleEmployment.be  This test is not yet approved or cleared by the [place] FDA and has been authorized for detection and/or diagnosis of SARS-CoV-2 by FDA under an Emergency Use Authorization (EUA). This EUA will remain in effect (meaning this test can be used) for the  duration of the COVID-19 declaration under Section 564(b)(1) of the Act, 21 U.S.C. section 360bbb-3(b)(1), unless the authorization is terminated or revoked.  Performed at [facility], [address] [place], [place] [address]   Surgical pcr screen     Status: None   Collection Time: 08/04/21  5:15 AM  Result Value Ref Range Status   MRSA, PCR NEGATIVE NEGATIVE Final   Staphylococcus aureus NEGATIVE NEGATIVE Final    Comment: (NOTE) The Xpert SA Assay (FDA approved for NASAL specimens in patients 38 years of age and older), is one component of a comprehensive surveillance program. It is not intended to diagnose infection nor to guide or monitor treatment. Performed at [facility], [address] [place], [place] [address]        Radiology Studies: DG Chest 1 View  Result Date: 08/03/2021 CLINICAL DATA:  Known periprosthetic hip fracture, initial encounter EXAM: CHEST  1 VIEW COMPARISON:  09/26/2019 FINDINGS: Cardiac shadow is within normal limits. Aortic calcifications are noted. The lungs are well aerated bilaterally. No bony abnormality is seen. IMPRESSION: No acute abnormality noted. Electronically Signed   By: Inez Catalina M.D.   On: 08/03/2021 20:01   CT HEAD WO CONTRAST (5MM)  Result Date: 08/03/2021 CLINICAL DATA:  Status post fall. EXAM: CT HEAD WITHOUT CONTRAST TECHNIQUE: Contiguous axial images were obtained from the base of the skull through the vertex without intravenous contrast. RADIATION DOSE REDUCTION: This exam was performed according to the departmental dose-optimization program which includes automated exposure control, adjustment of the mA and/or kV according to patient size and/or use of iterative reconstruction technique. COMPARISON:  October 13, 2019 FINDINGS: Brain: There is mild cerebral atrophy with widening of the extra-axial spaces and ventricular dilatation. There are areas of decreased attenuation within the white matter tracts of the supratentorial brain, consistent with microvascular disease changes. Vascular: No hyperdense vessel or unexpected calcification. Skull: Normal. Negative for fracture or focal lesion. Sinuses/Orbits: No acute finding. Other: None. IMPRESSION: 1. Generalized cerebral atrophy. 2. No acute intracranial abnormality. Electronically Signed   By: Virgina Norfolk M.D.   On: 08/03/2021 20:18   CT Cervical Spine Wo Contrast  Result Date: 08/03/2021 CLINICAL DATA:  Status post fall. EXAM: CT CERVICAL SPINE WITHOUT CONTRAST TECHNIQUE: Multidetector CT imaging of the cervical spine was performed without intravenous contrast. Multiplanar CT image reconstructions were also generated. RADIATION DOSE REDUCTION: This exam was performed according to the departmental dose-optimization  program which includes automated exposure control, adjustment of the mA and/or kV according to patient size and/or use of iterative reconstruction technique. COMPARISON:  None. FINDINGS: Alignment: There is approximately 1 mm anterolisthesis of the C4 vertebral body on C5. Skull base and vertebrae: No acute fracture. No primary bone lesion or focal pathologic process. Soft tissues and spinal canal: No prevertebral fluid or swelling. No visible canal hematoma. Disc levels: Mild multilevel endplate sclerosis is seen, slightly more prominent at the levels of C4-C5 and C5-C6. Mild anterior osteophyte formation is also noted at the levels of C3-C4, C4-C5 and C5-C6. There is marked severity narrowing of the anterior atlantoaxial articulation. Mild to moderate severity intervertebral disc space narrowing is seen at the level of C4-C5. Mild intervertebral disc space narrowing is present throughout the remainder of the cervical spine. Bilateral moderate to marked severity multilevel facet joint hypertrophy is noted. Upper chest: Negative. Other: None. IMPRESSION: 1. No acute fracture of the cervical spine. 2. Multilevel degenerative changes, as described above, most prominent at the level of C4-C5.  Electronically Signed   By: Virgina Norfolk M.D.   On: 08/03/2021 20:21   CT HIP RIGHT WO CONTRAST  Result Date: 08/03/2021 CLINICAL DATA:  Periprosthetic fracture on recent plain film EXAM: CT OF THE RIGHT HIP WITHOUT CONTRAST TECHNIQUE: Multidetector CT imaging of the right hip was performed according to the standard protocol. Multiplanar CT image reconstructions were also generated. RADIATION DOSE REDUCTION: This exam was performed according to the departmental dose-optimization program which includes automated exposure control, adjustment of the mA and/or kV according to patient size and/or use of iterative reconstruction technique. COMPARISON:  From earlier in the same day per FINDINGS: Bones/Joint/Cartilage Right hip  prosthesis is noted in satisfactory position. Periprosthetic fracture is noted involving the greater trochanter. No lesser trochanter fracture is noted. No other fracture along the shaft of the prosthesis is seen. Prosthesis is well seated. Ligaments Suboptimally assessed by CT. Muscles and Tendons No definitive muscular abnormality is noted. Considerable scatter artifact is seen. Soft tissues Bladder is well distended a significant amount of air is noted within the bladder. Air is also noted within the wall of the bladder particularly in the superior wall the bladder best seen on coronal image number 16 of series 6. This would be consistent with emphysematous cystitis. IMPRESSION: Periprostatic fracture involving the greater trochanter. Well distended bladder with a considerable amount of air within. Additionally there are some findings consistent with air within the wall of the bladder. This would be consistent with emphysematous cystitis. Diverticulosis without definitive diverticulitis. Critical Value/emergent results were called by telephone at the time of interpretation on 08/03/2021 at 8:47 pm to Dr. Duffy Bruce , who verbally acknowledged these results. Electronically Signed   By: Inez Catalina M.D.   On: 08/03/2021 20:48   DG Hip Unilat W or Wo Pelvis 2-3 Views Right  Result Date: 08/03/2021 CLINICAL DATA:  Recent fall with right hip pain, initial encounter EXAM: DG HIP (WITH OR WITHOUT PELVIS) 3V RIGHT COMPARISON:  08/02/2011 FINDINGS: Right hip replacement is noted and stable. Pelvic ring is intact. Acute periprosthetic fracture is noted in the region of the greater trochanter new from the prior exam. No soft tissue abnormality is seen. IMPRESSION: Fracture involving the greater trochanter in a periprosthetic location. Electronically Signed   By: Inez Catalina M.D.   On: 08/03/2021 19:59     Scheduled Meds:  amLODipine  5 mg Oral Daily   aspirin EC  81 mg Oral Daily   dorzolamide  1 drop Both  Eyes BID   [START ON 08/05/2021] feeding supplement  237 mL Oral Q24H   gabapentin  100 mg Oral BID   irbesartan  150 mg Oral Daily   And   hydrochlorothiazide  25 mg Oral Daily   insulin aspart  0-9 Units Subcutaneous Q4H   latanoprost  1 drop Both Eyes QHS   levothyroxine  100 mcg Oral Q0600   multivitamin with minerals  1 tablet Oral Daily   mupirocin ointment  1 application Nasal BID   nortriptyline  25 mg Oral QHS   pantoprazole  40 mg Oral Daily   pravastatin  10 mg Oral q1800   cyanocobalamin  1,000 mcg Oral Daily   Continuous Infusions:   LOS: 1 day     Enzo Bi, MD [facility] If 7PM-7AM, please contact night-coverage 08/04/2021, 5:41 PM

## 2021-08-04 NOTE — Consult Note (Signed)
ORTHOPAEDIC CONSULTATION  REQUESTING PHYSICIAN: Enzo Bi, MD  Chief Complaint:   R hip pain  History of Present Illness: Robyn Butler is a 88 y.o. female who [place] a [place] the carwash while attempting to get back into her car. She ambulated unassisted at baseline. She has a history of prior R hip hemiarthroplasty by Dr. Sabra Heck ~10 years ago and has not had any issue with it since that time. She noted increased pain on the R hip hip and difficulty ambulating afterwards.   She has a medical history significant for COPD, DM, CKD 3A, hypothyroidism, Anemia of CKD, and HTN.   Past Medical History:  Diagnosis Date   Arthritis    Cancer ([place])    Chronic kidney disease    COPD (chronic obstructive pulmonary disease) ([place])    Diabetes mellitus without complication ([place])    type 2   Hypertension    Varicose veins of lower extremities with other complications    Past Surgical History:  Procedure Laterality Date   ABDOMINAL HYSTERECTOMY  1972   BLADDER SUSPENSION     BREAST EXCISIONAL BIOPSY Right 1999   neg   CHOLECYSTECTOMY  1975   COLONOSCOPY     COLONOSCOPY WITH PROPOFOL N/A 05/24/2015   Procedure: COLONOSCOPY WITH PROPOFOL;  Surgeon: Hulen Luster, MD;  Location: [facility] ENDOSCOPY;  Service: Gastroenterology;  Laterality: N/A;   CYSTOSCOPY  1972   ESOPHAGOGASTRODUODENOSCOPY N/A 05/24/2015   Procedure: ESOPHAGOGASTRODUODENOSCOPY (EGD);  Surgeon: Hulen Luster, MD;  Location[facility] ENDOSCOPY;  Service: Gastroenterology;  Laterality: N/A;   FRACTURE SURGERY     JOINT REPLACEMENT Right 2013   hip   salpingo oophorectmy      THYROIDECTOMY  1969   WRIST FRACTURE SURGERY Left    Social History   Socioeconomic History   Marital status: Widowed    Spouse name: Not on file   Number of children: 2   Years of education: 9th Grade   Highest education level: 9th grade  Occupational History   Occupation: Retired  Tobacco  Use   Smoking status: Former    Types: Cigarettes    Quit date: 07/18/1991    Years since quitting: 30.0   Smokeless tobacco: Never  Vaping Use   Vaping Use: Never used  Substance and Sexual Activity   Alcohol use: No   Drug use: No   Sexual activity: Not on file  Other Topics Concern   Not on file  Social History Narrative   In [place] in senior place; quit smoking [> 25 years ago]; no alcohol; hosiery.    Social Determinants of Health   Financial Resource Strain: Not on file  Food Insecurity: Not on file  Transportation Needs: Not on file  Physical Activity: Not on file  Stress: Not on file  Social Connections: Not on file   Family History  Problem Relation Age of Onset   Heart attack Father    Asthma Father    Kidney disease Mother    Hypertension Mother    Hypertension Other    Diabetes Other    Heart attack Other    Breast cancer Neg Hx    Allergies  Allergen Reactions   Alendronate Nausea And Vomiting    Other reaction(s): Vomiting   Prior to Admission medications   Medication Sig Start Date End Date Taking? Authorizing Provider  amLODipine (NORVASC) 5 MG tablet TAKE ONE(1) TABLET EACH DAY Patient taking differently: Take 5 mg by mouth daily. 03/22/19  Yes Fisher,  Kirstie Peri, MD  aspirin EC 81 MG tablet Take 81 mg by mouth daily.    Yes [provider]  Cholecalciferol (VITAMIN D3) 50 MCG (2000 UT) TABS Take 1 tablet by mouth daily.   Yes [provider]  Cranberry 500 MG CAPS Take 500 mg by mouth daily.   Yes [provider]  cyanocobalamin 1000 MCG tablet Take 1,000 mcg by mouth daily.   Yes [provider]  dorzolamide (TRUSOPT) 2 % ophthalmic solution Place 1 drop into both eyes 2 (two) times daily.    Yes [provider]  gabapentin (NEURONTIN) 100 MG capsule Take 1 capsule by mouth 2 (two) times daily. 06/01/21 06/01/22 Yes [provider]  ibandronate (BONIVA) 150 MG tablet TAKE ONE TABLET ONCE A MONTH  FIRST THING IN THE MORNING AT LEAST 1 HOUR BEFORE EATING, TAKE WITH WATER. 03/07/19  Yes Birdie Sons, MD  latanoprost (XALATAN) 0.005 % ophthalmic solution Place 1 drop into both eyes at bedtime.    Yes [provider]  levothyroxine (SYNTHROID) 100 MCG tablet TAKE 1 TABLET BY MOUTH EVERY DAY 03/22/19  Yes Birdie Sons, MD  lovastatin (MEVACOR) 40 MG tablet TAKE 1 TABLET BY MOUTH AT BEDTIME 08/28/19  Yes Birdie Sons, MD  magnesium oxide (MAG-OX) 400 MG tablet Take 400 mg by mouth daily.   Yes [provider]  Multiple Vitamins-Minerals (PRESERVISION AREDS 2) CAPS Take 1 capsule by mouth 2 (two) times daily.   Yes [provider]  nortriptyline (PAMELOR) 25 MG capsule TAKE 1 CAPSULE(25 MG) BY MOUTH AT BEDTIME 08/16/19  Yes Birdie Sons, MD  omeprazole (PRILOSEC) 40 MG capsule Take 1 capsule (40 mg total) by mouth daily. 09/17/18  Yes Birdie Sons, MD  SPIRIVA RESPIMAT 2.5 MCG/ACT AERS SMARTSIG:2 Puff(s) By Mouth Daily 06/09/19  Yes [provider]  traMADol (ULTRAM) 50 MG tablet TAKE ONE TABLET EVERY 8 HOUR AS NEEDED FOR PAIN 09/17/18  Yes Birdie Sons, MD  valsartan-hydrochlorothiazide (DIOVAN-HCT) 160-25 MG tablet Take 1 tablet by mouth daily. 09/17/18  Yes Birdie Sons, MD  zinc sulfate 220 (50 Zn) MG capsule Take 220 mg by mouth daily.   Yes [provider]  Blood Glucose Monitoring Suppl (ONE TOUCH ULTRA 2) w/Device KIT Use to check blood sugar daily 01/02/18   Birdie Sons, MD  glucose blood (ONETOUCH ULTRA) test strip USE TO TEST BLOOD SUGAR TWICE DAILY 06/17/19   Birdie Sons, MD  Lancets [facility] ULTRASOFT) lancets Use as instructed to check blood sugar once a day 02/07/19   Birdie Sons, MD  loperamide (IMODIUM) 2 MG capsule Take 2 mg by mouth as needed for diarrhea or loose stools.    [provider]  Polyethylene Glycol 3350 (DULCOLAX BALANCE PO) Take 2 tablets by mouth as needed.    [provider]    Recent Labs    08/03/21 1830 08/03/21 2159  WBC 6.7  --   HGB 9.5*  --   HCT 31.3*  --   PLT 237  --   K 5.0  --   CL 106  --   CO2 26  --   BUN 33*  --   CREATININE 1.04*  --   GLUCOSE 112*  --   CALCIUM 9.1  --   INR  --  1.1   DG Chest 1 View  Result Date: 08/03/2021 CLINICAL DATA:  Known periprosthetic hip fracture, initial encounter EXAM: CHEST  1 VIEW  COMPARISON:  09/26/2019 FINDINGS: Cardiac shadow is within normal limits. Aortic calcifications are noted. The lungs are well aerated bilaterally. No bony abnormality is seen. IMPRESSION: No acute abnormality noted. Electronically Signed   By: Inez Catalina M.D.   On: 08/03/2021 20:01   CT HEAD WO CONTRAST (5MM)  Result Date: 08/03/2021 CLINICAL DATA:  Status post fall. EXAM: CT HEAD WITHOUT CONTRAST TECHNIQUE: Contiguous axial images were obtained from the base of the skull through the vertex without intravenous contrast. RADIATION DOSE REDUCTION: This exam was performed according to the departmental dose-optimization program which includes automated exposure control, adjustment of the mA and/or kV according to patient size and/or use of iterative reconstruction technique. COMPARISON:  October 13, 2019 FINDINGS: Brain: There is mild cerebral atrophy with widening of the extra-axial spaces and ventricular dilatation. There are areas of decreased attenuation within the white matter tracts of the supratentorial brain, consistent with microvascular disease changes. Vascular: No hyperdense vessel or unexpected calcification. Skull: Normal. Negative for fracture or focal lesion. Sinuses/Orbits: No acute finding. Other: None. IMPRESSION: 1. Generalized cerebral atrophy. 2. No acute intracranial abnormality. Electronically Signed   By: Virgina Norfolk M.D.   On: 08/03/2021 20:18   CT Cervical Spine Wo Contrast  Result Date: 08/03/2021 CLINICAL DATA:  Status post fall. EXAM: CT CERVICAL SPINE WITHOUT CONTRAST TECHNIQUE: Multidetector CT  imaging of the cervical spine was performed without intravenous contrast. Multiplanar CT image reconstructions were also generated. RADIATION DOSE REDUCTION: This exam was performed according to the departmental dose-optimization program which includes automated exposure control, adjustment of the mA and/or kV according to patient size and/or use of iterative reconstruction technique. COMPARISON:  None. FINDINGS: Alignment: There is approximately 1 mm anterolisthesis of the C4 vertebral body on C5. Skull base and vertebrae: No acute fracture. No primary bone lesion or focal pathologic process. Soft tissues and spinal canal: No prevertebral fluid or swelling. No visible canal hematoma. Disc levels: Mild multilevel endplate sclerosis is seen, slightly more prominent at the levels of C4-C5 and C5-C6. Mild anterior osteophyte formation is also noted at the levels of C3-C4, C4-C5 and C5-C6. There is marked severity narrowing of the anterior atlantoaxial articulation. Mild to moderate severity intervertebral disc space narrowing is seen at the level of C4-C5. Mild intervertebral disc space narrowing is present throughout the remainder of the cervical spine. Bilateral moderate to marked severity multilevel facet joint hypertrophy is noted. Upper chest: Negative. Other: None. IMPRESSION: 1. No acute fracture of the cervical spine. 2. Multilevel degenerative changes, as described above, most prominent at the level of C4-C5. Electronically Signed   By: Virgina Norfolk M.D.   On: 08/03/2021 20:21   CT HIP RIGHT WO CONTRAST  Result Date: 08/03/2021 CLINICAL DATA:  Periprosthetic fracture on recent plain film EXAM: CT OF THE RIGHT HIP WITHOUT CONTRAST TECHNIQUE: Multidetector CT imaging of the right hip was performed according to the standard protocol. Multiplanar CT image reconstructions were also generated. RADIATION DOSE REDUCTION: This exam was performed according to the departmental dose-optimization program which  includes automated exposure control, adjustment of the mA and/or kV according to patient size and/or use of iterative reconstruction technique. COMPARISON:  From earlier in the same day per FINDINGS: Bones/Joint/Cartilage Right hip prosthesis is noted in satisfactory position. Periprosthetic fracture is noted involving the greater trochanter. No lesser trochanter fracture is noted. No other fracture along the shaft of the prosthesis is seen. Prosthesis is well seated. Ligaments Suboptimally assessed by CT. Muscles and Tendons No definitive muscular abnormality is  noted. Considerable scatter artifact is seen. Soft tissues Bladder is well distended a significant amount of air is noted within the bladder. Air is also noted within the wall of the bladder particularly in the superior wall the bladder best seen on coronal image number 16 of series 6. This would be consistent with emphysematous cystitis. IMPRESSION: Periprostatic fracture involving the greater trochanter. Well distended bladder with a considerable amount of air within. Additionally there are some findings consistent with air within the wall of the bladder. This would be consistent with emphysematous cystitis. Diverticulosis without definitive diverticulitis. Critical Value/emergent results were called by telephone at the time of interpretation on 08/03/2021 at 8:47 pm to Dr. Duffy Bruce , who verbally acknowledged these results. Electronically Signed   By: Inez Catalina M.D.   On: 08/03/2021 20:48   DG Hip Unilat W or Wo Pelvis 2-3 Views Right  Result Date: 08/03/2021 CLINICAL DATA:  Recent fall with right hip pain, initial encounter EXAM: DG HIP (WITH OR WITHOUT PELVIS) 3V RIGHT COMPARISON:  08/02/2011 FINDINGS: Right hip replacement is noted and stable. Pelvic ring is intact. Acute periprosthetic fracture is noted in the region of the greater trochanter new from the prior exam. No soft tissue abnormality is seen. IMPRESSION: Fracture involving the  greater trochanter in a periprosthetic location. Electronically Signed   By: Inez Catalina M.D.   On: 08/03/2021 19:59     Positive ROS: All other systems have been reviewed and were otherwise negative with the exception of those mentioned in the HPI and as above.  Physical Exam: BP (!) 165/58 (BP Location: Right Arm)    Pulse 72    Temp 98 F (36.7 C)    Resp 16    Ht 5' 2.99" (1.6 m)    Wt 54.1 kg    SpO2 96%    BMI 21.13 kg/m  General:  Alert, no acute distress Psychiatric:  Patient is competent for consent with normal mood and affect   Cardiovascular:  No pedal edema, regular rate and rhythm Respiratory:  No wheezing, non-labored breathing GI:  Abdomen is soft and non-tender Skin:  No lesions in the area of chief complaint, no erythema Neurologic:  Sensation intact distally, CN grossly intact Lymphatic:  No axillary or cervical lymphadenopathy  Orthopedic Exam:  RLE: 5/5 DF/PF/EHL SILT grossly over foot Foot wwp Negative log roll (only mild pain), negative axial load Moderate TTP over greater trochanter Able to flex/IR/ER hip with mild pain  Imaging:  As above: Minimally displaced right greater trochanter fracture.  This fracture line does not appear to involve the right hip hemiarthroplasty and implant does not appear to show signs of loosening.  Assessment/Plan: 88 year old female with minimally displaced greater trochanteric fracture in the setting of a right hip hemiarthroplasty.  Fracture itself does not appear to involve the implant. Given fracture pattern and current physical exam, we can plan for nonoperative management. No plan for OR currently. Patient may have diet.  WBAT on RLE. Avoid active hip abduction PT/OT for mobilization Recommend pain control prior to PT/OT evaluation.     Leim Fabry   08/04/2021 8:56 AM

## 2021-08-04 NOTE — Evaluation (Signed)
Physical Therapy Evaluation Patient Details Name: Robyn Butler MRN: [id number] DOB: [birth year] Today's Date: 08/04/2021  History of Present Illness  Prarthana B Gavilanes is a 88 y.o. female with medical history significant for HTN,  COPD,DM, CKD 3A, hypothyroidism, Anemia of CKD on  IV Venofer,  independent at baseline and with prior right hip repair who Presented to the emergency room following an accidental fall onto her right hip as she was getting back in the car after taking it to the car wash. Found to have periprosthetic fracture around internal prosthetic right hip joint, managed nonoperatively.   Clinical Impression  Pt is A&Ox4 upon PT arrival to room, reports 4/10 pain while resting in bed, and is HOH. Pt reports that she lives alone in a 1-story 1st floor apartment. Her apartment set up consists of separate bed and bathroom, with the bathroom being located down the hall. She states her bathroom set up consists of a tub shower w/ railings and a standard toilet with railings as well. Pt reports she does drive (avoids driving at night) and is able to perform all ADLs by herself.   Before performing any tasks, Pt was educated on her precautions and importance of getting OOB on the left and into bed on the right to meet those precautions and expressed understanding. Pt was able to perform bed mobility w/ CGA and reliance on bed rails. When performing supine to sit she did require minA to elevate trunk. She performed sit to stand w/ CGA and bed elevated (due to subjective report of bed at home being elevated). Pt did require some verbal cues about appropriate hand placement for RW. Once standing patient was able to ambulate ~15 ft w/ CGA and RW, and reported pain level did increase to a 5/10. Nursing did enter the room upon completion of PT session and education was provided by both PT and nursing about appropriate administration of pain medicine for possible aid in future therapy sessions. Pt will  benefit from continued skilled PT in order to improve LE strength, mobility, gait, decrease c/o pain, and restore PLOF. Current discharge recommendation is to SNF due to the level of assistance required by the patient to ensure safety and improve overall function.    Recommendations for follow up therapy are one component of a multi-disciplinary discharge planning process, led by the attending physician.  Recommendations may be updated based on patient status, additional functional criteria and insurance authorization.  Follow Up Recommendations Skilled nursing-short term rehab (<3 hours/day)    Assistance Recommended at Discharge Intermittent Supervision/Assistance  Patient can return home with the following  A lot of help with walking and/or transfers;A lot of help with bathing/dressing/bathroom;Assistance with cooking/housework;Help with stairs or ramp for entrance;Direct supervision/assist for medications management    Equipment Recommendations BSC/3in1 (due to bathroom being located down the hall)  Recommendations for Other Services       Functional Status Assessment Patient has had a recent decline in their functional status and demonstrates the ability to make significant improvements in function in a reasonable and predictable amount of time.     Precautions / Restrictions Precautions Precautions: Fall Restrictions Weight Bearing Restrictions: Yes RLE Weight Bearing: Weight bearing as tolerated      Mobility  Bed Mobility Overal bed mobility: Needs Assistance Bed Mobility: Supine to Sit     Supine to sit: HOB elevated, Min guard, Min assist          Transfers Overall transfer level: Needs assistance Equipment used: Rolling  walker (2 wheels) Transfers: Sit to/from Stand, Bed to chair/wheelchair/BSC Sit to Stand: Min guard                Ambulation/Gait Ambulation/Gait assistance: Min guard, Supervision Gait Distance (Feet): 15 Feet Assistive device:  Rolling walker (2 wheels) Gait Pattern/deviations: Step-to pattern, Decreased step length - right, Decreased step length - left, Decreased stride length, Narrow base of support Gait velocity: decreased     General Gait Details: does a good job with utilizing RW to off-load RLE for advancement.  Stairs            Wheelchair Mobility    Modified Rankin (Stroke Patients Only)       Balance Overall balance assessment: Needs assistance Sitting-balance support: No upper extremity supported, Feet supported Sitting balance-Leahy Scale: Good     Standing balance support: Bilateral upper extremity supported Standing balance-Leahy Scale: Fair                               Pertinent Vitals/Pain Pain Assessment Pain Assessment: 0-10 Pain Score: 5  Pain Location: R hip Pain Descriptors / Indicators: Discomfort, Grimacing, Sore, Dull Pain Intervention(s): Limited activity within patient's tolerance, Monitored during session, Patient requesting pain meds-RN notified, Repositioned    Home Living Family/patient expects to be discharged to:: Private residence Living Arrangements: Alone Available Help at Discharge: Family;Available PRN/intermittently Type of Home: Apartment Home Access: Level entry       Home Layout: One level Home Equipment: Conservation officer, nature (2 wheels);Grab bars - toilet;Grab bars - tub/shower      Prior Function Prior Level of Function : Independent/Modified Independent;Driving               ADLs Comments: pt cooks all her meals     Hand Dominance   Dominant Hand: Right    Extremity/Trunk Assessment   Upper Extremity Assessment Upper Extremity Assessment: Overall WFL for tasks assessed    Lower Extremity Assessment Lower Extremity Assessment: Generalized weakness       Communication   Communication: HOH  Cognition Arousal/Alertness: Awake/alert Behavior During Therapy: WFL for tasks assessed/performed Overall Cognitive  Status: Within Functional Limits for tasks assessed                                          General Comments      Exercises     Assessment/Plan    PT Assessment Patient needs continued PT services  PT Problem List Decreased strength;Decreased range of motion;Decreased activity tolerance;Decreased balance;Decreased mobility;Decreased knowledge of precautions;Pain       PT Treatment Interventions Gait training;Functional mobility training;Therapeutic activities;Therapeutic exercise;Balance training;Patient/family education;DME instruction    PT Goals (Current goals can be found in the Care Plan section)  Acute Rehab PT Goals Patient Stated Goal: decrease R hip pain in order to go home PT Goal Formulation: With patient Time For Goal Achievement: 08/18/21 Potential to Achieve Goals: Good    Frequency 7X/week     Co-evaluation               AM-PAC PT "6 Clicks" Mobility  Outcome Measure Help needed turning from your back to your side while in a flat bed without using bedrails?: A Little Help needed moving from lying on your back to sitting on the side of a flat bed without using bedrails?: A Little Help  needed moving to and from a bed to a chair (including a wheelchair)?: A Little Help needed standing up from a chair using your arms (e.g., wheelchair or bedside chair)?: A Little Help needed to walk in hospital room?: A Lot Help needed climbing 3-5 steps with a railing? : Total 6 Click Score: 15    End of Session Equipment Utilized During Treatment: Gait belt Activity Tolerance: Patient tolerated treatment well;Patient limited by pain Patient left: in bed;with call bell/phone within reach;with bed alarm set Nurse Communication: Mobility status;Patient requests pain meds (conversation about Pt education on appropriate pain med adminstering (aka before vs. after therapy)) PT Visit Diagnosis: Unsteadiness on feet (R26.81);Other abnormalities of gait and  mobility (R26.89);Muscle weakness (generalized) (M62.81);Pain Pain - Right/Left: Right Pain - part of body: Hip    Time: 1335-1420 PT Time Calculation (min) (ACUTE ONLY): 45 min   Charges:              Jonnie Kind, SPT 08/04/2021, 2:39 PM

## 2021-08-04 NOTE — Progress Notes (Signed)
Initial Nutrition Assessment RD working remotely.   DOCUMENTATION CODES:   Not applicable  INTERVENTION:  - will order Ensure Enlive once/day, each supplement provides 350 kcal and 20 grams of protein. - will order 1 tablet multivitamin with minerals/day. - complete NFPE when feasible.   NUTRITION DIAGNOSIS:   Increased nutrient needs related to hip fracture as evidenced by estimated needs.  GOAL:   Patient will meet greater than or equal to 90% of their needs  MONITOR:   PO intake, Supplement acceptance, Labs, Weight trends  REASON FOR ASSESSMENT:   Consult Hip fracture protocol  ASSESSMENT:   88 y.o. female with medical history of HTN, COPD ,DM, stage 3 CKD, hypothyroidism, anemia of CKD on IV Venofer, and arthritis. Patient presented to the ED after a fall after attempting to get back into her car at the car wash. She subsequently had R hip pain.  Able to call into patient's room. Her daughter answered and was able to provide information. She shares that patient does best with breakfast meal and that breakfast for tomorrow has already been ordered.  She denies her mom having any nausea since hospitalization. She denies her having any chewing or swallowing difficulties with any items at baseline.   Weight today is 119 lb and weight appears to be stable since at least 09/26/19. Her daughter confirms that patient's weight has been stable.   Per notes, plan for non-surgical management of R hip fx.   Labs reviewed; CBGs: 82-178 mg/dl, BUN: 33 mg/dl, creatinine: 1.04 mg/dl, GFR: 51 ml/min.  Medications reviewed; sliding scale novolog, 100 mcg oral synthroid/day, 40 mg oral protonix/day, 1000 mcg oral cyanocobalamin/day.   NUTRITION - FOCUSED PHYSICAL EXAM:  RD working on another campus and unable at this time.  Diet Order:   Diet Order             Diet regular Room service appropriate? Yes; Fluid consistency: Thin  Diet effective now                    EDUCATION NEEDS:   No education needs have been identified at this time  Skin:  Skin Assessment: Skin Integrity Issues: Skin Integrity Issues:: Stage I Stage I: coccyx  Last BM:  1/19 (type 3 x1, large amount and type 4 x1, small amount)  Height:   Ht Readings from Last 1 Encounters:  08/04/21 5' 2.99" (1.6 m)    Weight:   Wt Readings from Last 1 Encounters:  08/04/21 54.1 kg     Estimated Nutritional Needs:  Kcal:  1400-1650 kcal Protein:  75-85 grams Fluid:  >/= 1.5 L/day      Jarome Matin, MS, RD, LDN Inpatient Clinical Dietitian RD pager # available in [place]  After hours/weekend pager # available in [facility]

## 2021-08-04 NOTE — Evaluation (Addendum)
Occupational Therapy Evaluation Patient Details Name: Robyn Butler MRN: [id number] DOB: 04/29/1929 Today's Date: 08/04/2021   History of Present Illness Robyn Butler is a 88 y.o. female with medical history significant for HTN,  COPD,DM, CKD 3A, hypothyroidism, Anemia of CKD on  IV Venofer,  independent at baseline and with prior right hip repair who Presented to the emergency room following an accidental fall onto her right hip as she was getting back in the car after taking it to the car wash. Found to have periprosthetic fracture around internal prosthetic right hip joint, managed nonoperatively.   Clinical Impression   Robyn Butler was seen for OT evaluation this date. Prior to hospital admission, pt was Independent for mobility and I/ADLs including cooking and driving. Pt lives alone in senior living apartments with family available PRN. Pt presents to acute OT demonstrating impaired ADL performance and functional mobility 2/2 decreased activity tolerance and functional strength/ROM/balance deficits. Pt currently requires MIN A exit L side of bed - instructed on no active hip abduction. MOD A for LB access sitting EOB. CGA + RW for x3 sit<>stand and bed>chair step pivot t/f - cues for technique. SETUP seated grooming tasks. Pt would benefit from skilled OT to address noted impairments and functional limitations (see below for any additional details). Upon hospital discharge, recommend HHOT to maximize pt safety and return to PLOF; however, patient unable to have family assistance upon d/c therefore recommend STR.       Recommendations for follow up therapy are one component of a multi-disciplinary discharge planning process, led by the attending physician.  Recommendations may be updated based on patient status, additional functional criteria and insurance authorization.   Follow Up Recommendations  Skilled nursing-short term rehab (<3 hours/day)    Assistance Recommended at Discharge Frequent  or constant Supervision/Assistance  Patient can return home with the following A little help with bathing/dressing/bathroom;Assistance with cooking/housework;Help with stairs or ramp for entrance    Functional Status Assessment  Patient has had a recent decline in their functional status and demonstrates the ability to make significant improvements in function in a reasonable and predictable amount of time.  Equipment Recommendations  BSC/3in1    Recommendations for Other Services       Precautions / Restrictions Precautions Precautions: Fall Restrictions Weight Bearing Restrictions: No      Mobility Bed Mobility Overal bed mobility: Needs Assistance Bed Mobility: Supine to Sit     Supine to sit: Min assist, HOB elevated          Transfers Overall transfer level: Needs assistance Equipment used: Rolling walker (2 wheels) Transfers: Sit to/from Stand, Bed to chair/wheelchair/BSC Sit to Stand: Min guard     Step pivot transfers: Min guard            Balance Overall balance assessment: Needs assistance Sitting-balance support: No upper extremity supported, Feet supported Sitting balance-Leahy Scale: Good     Standing balance support: Bilateral upper extremity supported Standing balance-Leahy Scale: Fair                             ADL either performed or assessed with clinical judgement   ADL Overall ADL's : Needs assistance/impaired                                       General ADL Comments: MOD A for  LB access sitting EOB. CGA + RW for ADL t/f - cues for technique. SETUP seated grooming tasks.      Pertinent Vitals/Pain Pain Assessment Pain Assessment: 0-10 Pain Score: 5  Pain Location: R hip Pain Descriptors / Indicators: Dull, Discomfort, Grimacing Pain Intervention(s): Limited activity within patient's tolerance, Repositioned     Hand Dominance Right   Extremity/Trunk Assessment Upper Extremity Assessment Upper  Extremity Assessment: Overall WFL for tasks assessed   Lower Extremity Assessment Lower Extremity Assessment: Generalized weakness       Communication Communication Communication: HOH   Cognition Arousal/Alertness: Awake/alert Behavior During Therapy: WFL for tasks assessed/performed Overall Cognitive Status: Within Functional Limits for tasks assessed                                       General Comments       Exercises Exercises: Other exercises   Shoulder Instructions      Home Living Family/patient expects to be discharged to:: Private residence Living Arrangements: Alone Available Help at Discharge: Family;Available PRN/intermittently Type of Home: Apartment Home Access: Level entry     Home Layout: One level     Bathroom Shower/Tub: Walk-in shower                    Prior Functioning/Environment Prior Level of Function : Independent/Modified Independent;Driving               ADLs Comments: pt cooks all her meals        OT Problem List: Decreased strength;Decreased range of motion;Decreased activity tolerance;Impaired balance (sitting and/or standing);Decreased knowledge of use of DME or AE      OT Treatment/Interventions: Self-care/ADL training;Therapeutic exercise;Energy conservation;DME and/or AE instruction;Therapeutic activities;Patient/family education;Balance training    OT Goals(Current goals can be found in the care plan section) Acute Rehab OT Goals Patient Stated Goal: to walk and then go home OT Goal Formulation: With patient Time For Goal Achievement: 08/18/21 Potential to Achieve Goals: Good ADL Goals Pt Will Perform Grooming: with supervision;standing (c LRAD PRN) Pt Will Perform Lower Body Dressing: with supervision;sit to/from stand (c LRAD PRN) Pt Will Transfer to Toilet: with modified independence;ambulating;regular height toilet (c LRAD PRN)  OT Frequency: Min 3X/week    Co-evaluation               AM-PAC OT "6 Clicks" Daily Activity     Outcome Measure Help from another person eating meals?: None Help from another person taking care of personal grooming?: A Little Help from another person toileting, which includes using toliet, bedpan, or urinal?: A Little Help from another person bathing (including washing, rinsing, drying)?: A Little Help from another person to put on and taking off regular upper body clothing?: None Help from another person to put on and taking off regular lower body clothing?: A Lot 6 Click Score: 19   End of Session Equipment Utilized During Treatment: Rolling walker (2 wheels) Nurse Communication: Mobility status  Activity Tolerance: Patient tolerated treatment well Patient left: in chair;with call bell/phone within reach  OT Visit Diagnosis: Other abnormalities of gait and mobility (R26.89)                Time: 0865-7846 OT Time Calculation (min): 26 min Charges:  OT General Charges $OT Visit: 1 Visit OT Evaluation $OT Eval Low Complexity: 1 Low OT Treatments $Self Care/Home Management : 8-22 mins  Dessie Coma, M.S.  OTR/L  08/04/21, 1:14 PM  ascom [phone]

## 2021-08-04 NOTE — TOC Initial Note (Addendum)
Transition of Care [facility]) - Initial/Assessment Note    Patient Details  Name: Robyn Butler MRN: [id number] Date of Birth: 1928-12-25  [facility] CM/SW Contact:    Conception Oms, RN Phone Number: 08/04/2021, 10:13 AM  Clinical Narrative:        Per Ortho Recommend pain control prior to PT/OT evaluation, had a fall yesterday at the carwash while attempting to get back into her car. She ambulated unassisted at baseline. TOC to follow and assist with DC planning, Awaiting PT eval and recommendations      Spoke with her daughter Thayer Headings on the phone, she said that she can not commit to going to the patients home daily, the patient has an elderly friend 2 door down that will come daily but will only be able to help a small amount due to physical limitations The daughter feels that maybe STR SNF would be a better choice for a short amount of time The patient plans to return to her Retirement apartment after DC from [facility] STR  I spoke with the patient and she is agreeable to a bed search to go to [facility] SN[facility] PASSR [phone] A Fl2 completed Bed search sent  Patient Goals and CMS Choice        Expected Discharge Plan and Services                                                Prior Living Arrangements/Services                       Activities of Daily Living Home Assistive Devices/Equipment: None ADL Screening (condition at time of admission) Patient's cognitive ability adequate to safely complete daily activities?: Yes Is the patient deaf or have difficulty hearing?: Yes Does the patient have difficulty seeing, even when wearing glasses/contacts?: Yes Does the patient have difficulty concentrating, remembering, or making decisions?: No Patient able to express need for assistance with ADLs?: No Does the patient have difficulty dressing or bathing?: No Independently performs ADLs?: Yes (appropriate for developmental age) Does the patient have  difficulty walking or climbing stairs?: Yes Weakness of Legs: Right Weakness of Arms/Hands: None  Permission Sought/Granted                  Emotional Assessment              Admission diagnosis:  Lower urinary tract infectious disease [N39.0] Closed right hip fracture ([place]) [S72.001A] Fall, initial encounter [W19.XXXA] Closed fracture of right hip, initial encounter [facility]) [S72.001A] Patient Active Problem List   Diagnosis Date Noted   Closed right hip fracture ([place]) 08/03/2021   Diabetes mellitus without complication ([place])    Periprosthetic fracture around internal prosthetic right hip joint ([facility])    Preoperative clearance    Anemia of chronic renal failure, stage 3 (moderate) ([facility]) 09/26/2019   Osteoarthritis of hip 10/03/2016   Esophageal stricture 06/27/2016   History of adenomatous polyp of colon 06/16/2015   Dizziness 04/28/2015   Allergic rhinitis 01/27/2015   Absolute anemia 01/27/2015   Cataract 01/27/2015   Anemia due to stage 3a chronic kidney disease ([place]) 01/27/2015   Colon, diverticulosis 01/27/2015   Accumulation of fluid in tissues 01/27/2015   Esophageal reflux 01/27/2015   Glaucoma 01/27/2015   Hemorrhoid 01/27/2015   High potassium 01/27/2015   Hypomagnesemia 01/27/2015  Embedded toenail 01/27/2015   Microalbuminuria 01/27/2015   Neuropathy 01/27/2015   Neuralgia neuritis, sciatic nerve 01/27/2015   Skin lesion 01/27/2015   Cardiac enlargement 02/16/2014   OP (osteoporosis) 08/01/2012   Personal history of traumatic fracture 08/02/2011   Arthropathy of pelvic region and thigh 07/07/2009   Leg varices 01/02/2006   Diverticulitis of colon 01/20/2004   Barrett esophagus 10/26/2003   COPD (chronic obstructive pulmonary disease) ([place]) 04/16/2003   Aortic valve disorder 04/16/2003   Adult hypothyroidism 07/30/2002   Female genuine stress incontinence 02/07/2002   HLD (hyperlipidemia) 02/07/2002   Asthma 08/02/2001   Diabetic neuropathy  ([place]) 08/27/2000   Essential (primary) hypertension 08/27/2000   H/O malignant neoplasm of breast 07/17/1997   History of tobacco use 07/17/1988   H/O total hysterectomy 07/17/1970   PCP:  Kirk Ruths, MD Pharmacy:   [place], [place] - [address] [place] [address] Phone: [phone] Fax: [place], [place] [address] [place] [address] Phone: [phone] Fax: [place], [place] - [place] [address] Phone: [phone] Fax: [facility] [id number] Lorina Rabon, [address] [place] [address] Phone: [phone] Fax: [phone]  [facility] [address] [place] [address] Phone: [phone] Fax: [phone]     Social Determinants of Health (SDOH) Interventions    Readmission Risk Interventions No flowsheet data found.

## 2021-08-05 LAB — BASIC METABOLIC PANEL
Anion gap: 3 — ABNORMAL LOW (ref 5–15)
BUN: 32 mg/dL — ABNORMAL HIGH (ref 8–23)
CO2: 26 mmol/L (ref 22–32)
Calcium: 8.5 mg/dL — ABNORMAL LOW (ref 8.9–10.3)
Chloride: 107 mmol/L (ref 98–111)
Creatinine, Ser: 1.26 mg/dL — ABNORMAL HIGH (ref 0.44–1.00)
GFR, Estimated: 41 mL/min — ABNORMAL LOW (ref >60)
Glucose, Bld: 139 mg/dL — ABNORMAL HIGH (ref 70–99)
Potassium: 4.8 mmol/L (ref 3.5–5.1)
Sodium: 136 mmol/L (ref 135–145)

## 2021-08-05 LAB — MAGNESIUM: Magnesium: 2.1 mg/dL (ref 1.7–2.4)

## 2021-08-05 LAB — CBC
HCT: 28 % — ABNORMAL LOW (ref 36.0–46.0)
Hemoglobin: 8.6 g/dL — ABNORMAL LOW (ref 12.0–15.0)
MCH: 29.9 pg (ref 26.0–34.0)
MCHC: 30.7 g/dL (ref 30.0–36.0)
MCV: 97.2 fL (ref 80.0–100.0)
Platelets: 232 10*3/uL (ref 150–400)
RBC: 2.88 MIL/uL — ABNORMAL LOW (ref 3.87–5.11)
RDW: 13.1 % (ref 11.5–15.5)
WBC: 7.8 10*3/uL (ref 4.0–10.5)
nRBC: 0 % (ref 0.0–0.2)

## 2021-08-05 LAB — GLUCOSE, CAPILLARY
Glucose-Capillary: 123 mg/dL — ABNORMAL HIGH (ref 70–99)
Glucose-Capillary: 131 mg/dL — ABNORMAL HIGH (ref 70–99)
Glucose-Capillary: 149 mg/dL — ABNORMAL HIGH (ref 70–99)

## 2021-08-05 MED ORDER — COVID-19MRNA BIVAL VACC PFIZER 30 MCG/0.3ML IM SUSP
0.3000 mL | Freq: Once | INTRAMUSCULAR | Status: DC
  Filled 2021-08-05: qty 0.3

## 2021-08-05 NOTE — Plan of Care (Signed)

## 2021-08-05 NOTE — Progress Notes (Signed)
PROGRESS NOTE    Robyn Butler  ZDG:[id number] DOB: 1929/03/17 DOA: 08/03/2021 PCP: Kirk Ruths, MD  [id number]   Assessment & Plan:   Principal Problem:   Periprosthetic fracture around internal prosthetic right hip joint ([place]) Active Problems:   Anemia due to stage 3a chronic kidney disease ([place])   COPD (chronic obstructive pulmonary disease) ([place])   Essential (primary) hypertension   Adult hypothyroidism   Diabetes mellitus without complication ([place])   Preoperative clearance   Closed right hip fracture (H[facility])   Pressure injury of skin   Robyn Butler is a 88 y.o. female with medical history significant for HTN,  COPD,DM, CKD 3A, hypothyroidism, Anemia of CKD on  IV Venofer,  independent at baseline and with prior right hip repair who Presented to the emergency room following an accidental fall onto her right hip as she was getting back in the car after taking it to the car wash.       Periprosthetic fracture around internal prosthetic right hip joint secondary to accidental fall ([place]) --ortho rec non-operative management --PT/OT rec SNF rehab  Asymptomatic bacteruria  -not symptomatic  - Patient received a dose of Rocephin in the ED, not continued      Anemia due to stage 3a chronic kidney disease ([place]) -On Venofer IV, last  received, saw oncology on 1/6  -Monitor H&H - Renal function at baseline     COPD (chronic obstructive pulmonary disease) ([place]) --stable    Essential (primary) hypertension --cont home amlodipine and Diovan     Adult hypothyroidism --cont Synthroid     Diabetes mellitus without complication ([place]), well controlled --d/c'ed BG checks and SSI   DVT prophylaxis: Heparin SQ Code Status: Full code  Family Communication: daughter updated at bedside today  Level of care: Med-Surg Dispo:   The patient is from: home Anticipated d/c is to: SNF Anticipated d/c date is: whenever bed available, likely tomorrow  Patient currently is  medically ready to d/c.   Subjective and Interval History:  Hip pain improved.     Objective: Vitals:   08/04/21 2350 08/05/21 0549 08/05/21 0737 08/05/21 1143  BP: (!) 140/47 (!) 144/54 (!) 149/52 (!) 106/47  Pulse: 77 73 72 71  Resp: 17 18 16 14   Temp: 98.5 F (36.9 C) 97.7 F (36.5 C) 97.9 F (36.6 C) 97.9 F (36.6 C)  TempSrc:      SpO2: 94% 93% 94% 95%  Weight:      Height:        Intake/Output Summary (Last 24 hours) at 08/05/2021 1815 Last data filed at 08/05/2021 1030 Gross per 24 hour  Intake 240 ml  Output --  Net 240 ml   Filed Weights   08/03/21 1843 08/04/21 0043  Weight: 53 kg 54.1 kg    Examination:   Constitutional: NAD, AAOx3, sitting in recliner HEENT: conjunctivae and lids normal, EOMI CV: No cyanosis.   RESP: normal respiratory effort, on RA Neuro: II - XII grossly intact.   Psych: Normal mood and affect.  Appropriate judgement and reason   Data Reviewed: I have personally reviewed following labs and imaging studies  CBC: Recent Labs  Lab 08/03/21 1830 08/05/21 0607  WBC 6.7 7.8  NEUTROABS 4.0  --   HGB 9.5* 8.6*  HCT 31.3* 28.0*  MCV 98.7 97.2  PLT 237 951   Basic Metabolic Panel: Recent Labs  Lab 08/03/21 1830 08/05/21 0607  NA 137 136  K 5.0 4.8  CL 106 107  CO2 26 26  GLUCOSE 112* 139*  BUN 33* 32*  CREATININE 1.04* 1.26*  CALCIUM 9.1 8.5*  MG  --  2.1   GFR: Estimated Creatinine Clearance: 24.5 mL/min (A) (by C-G formula based on SCr of 1.26 mg/dL (H)). Liver Function Tests: No results for input(s): AST, ALT, ALKPHOS, BILITOT, PROT, ALBUMIN in the last 168 hours. No results for input(s): LIPASE, AMYLASE in the last 168 hours. No results for input(s): AMMONIA in the last 168 hours. Coagulation Profile: Recent Labs  Lab 08/03/21 2159  INR 1.1   Cardiac Enzymes: No results for input(s): CKTOTAL, CKMB, CKMBINDEX, TROPONINI in the last 168 hours. BNP (last 3 results) No results for input(s): PROBNP in the last  8760 hours. HbA1C: Recent Labs    08/03/21 1830  HGBA1C 5.9*   CBG: Recent Labs  Lab 08/04/21 1606 08/04/21 2043 08/05/21 0737 08/05/21 1144 08/05/21 1619  GLUCAP 143* 152* 123* 131* 149*   Lipid Profile: No results for input(s): CHOL, HDL, LDLCALC, TRIG, CHOLHDL, LDLDIRECT in the last 72 hours. Thyroid Function Tests: No results for input(s): TSH, T4TOTAL, FREET4, T3FREE, THYROIDAB in the last 72 hours. Anemia Panel: No results for input(s): VITAMINB12, FOLATE, FERRITIN, TIBC, IRON, RETICCTPCT in the last 72 hours. Sepsis Labs: No results for input(s): PROCALCITON, LATICACIDVEN in the last 168 hours.  Recent Results (from the past 240 hour(s))  Urine Culture     Status: Abnormal (Preliminary result)   Collection Time: 08/03/21  9:59 PM   Specimen: Urine, Random  Result Value Ref Range Status   Specimen Description   Final    URINE, RANDOM Performed at [facility], [address] [place], [place] [address]    Special Requests   Final    NONE Performed at [facility], [address] [place], [place] [address]    Culture (A)  Final    >=100,000 COLONIES/mL ESCHERICHIA COLI CULTURE REINCUBATED FOR BETTER GROWTH SUSCEPTIBILITIES TO FOLLOW Performed at [facility], [place] [address], [place], [place] [address]    Report Status PENDING  Incomplete  Resp Panel by RT-PCR (Flu A&B, Covid) Nasopharyngeal Swab     Status: None   Collection Time: 08/03/21 11:11 PM   Specimen: Nasopharyngeal Swab; Nasopharyngeal(NP) swabs in vial transport medium  Result Value Ref Range Status   SARS Coronavirus 2 by RT PCR NEGATIVE NEGATIVE Final    Comment: (NOTE) SARS-CoV-2 target nucleic acids are NOT DETECTED.  The SARS-CoV-2 RNA is generally detectable in upper respiratory specimens during the acute phase of infection. The lowest concentration of SARS-CoV-2 viral copies this assay can detect is 138 copies/mL. A negative result does not preclude  SARS-Cov-2 infection and should not be used as the sole basis for treatment or other patient management decisions. A negative result may occur with  improper specimen collection/handling, submission of specimen other than nasopharyngeal swab, presence of viral mutation(s) within the areas targeted by this assay, and inadequate number of viral copies(<138 copies/mL). A negative result must be combined with clinical observations, patient history, and epidemiological information. The expected result is Negative.  Fact Sheet for Patients:  EntrepreneurPulse.com.au  Fact Sheet for Healthcare Providers:  IncredibleEmployment.be  This test is no t yet approved or cleared by the [place] FDA and  has been authorized for detection and/or diagnosis of SARS-CoV-2 by FDA under an Emergency Use Authorization (EUA). This EUA will remain  in effect (meaning this test can be used) for the duration of the COVID-19 declaration under Section  564(b)(1) of the Act, 21 U.S.C.section 360bbb-3(b)(1), unless the authorization is terminated  or revoked sooner.       Influenza A by PCR NEGATIVE NEGATIVE Final   Influenza B by PCR NEGATIVE NEGATIVE Final    Comment: (NOTE) The Xpert Xpress SARS-CoV-2/FLU/RSV plus assay is intended as an aid in the diagnosis of influenza from Nasopharyngeal swab specimens and should not be used as a sole basis for treatment. Nasal washings and aspirates are unacceptable for Xpert Xpress SARS-CoV-2/FLU/RSV testing.  Fact Sheet for Patients: EntrepreneurPulse.com.au  Fact Sheet for Healthcare Providers: IncredibleEmployment.be  This test is not yet approved or cleared by the [place] FDA and has been authorized for detection and/or diagnosis of SARS-CoV-2 by FDA under an Emergency Use Authorization (EUA). This EUA will remain in effect (meaning this test can be used) for the duration of  the COVID-19 declaration under Section 564(b)(1) of the Act, 21 U.S.C. section 360bbb-3(b)(1), unless the authorization is terminated or revoked.  Performed at [facility], [address] [place], [place] [address]   Surgical pcr screen     Status: None   Collection Time: 08/04/21  5:15 AM  Result Value Ref Range Status   MRSA, PCR NEGATIVE NEGATIVE Final   Staphylococcus aureus NEGATIVE NEGATIVE Final    Comment: (NOTE) The Xpert SA Assay (FDA approved for NASAL specimens in patients 54 years of age and older), is one component of a comprehensive surveillance program. It is not intended to diagnose infection nor to guide or monitor treatment. Performed at [facility], [address], [place], [place] [address]       Radiology Studies: DG Chest 1 View  Result Date: 08/03/2021 CLINICAL DATA:  Known periprosthetic hip fracture, initial encounter EXAM: CHEST  1 VIEW COMPARISON:  09/26/2019 FINDINGS: Cardiac shadow is within normal limits. Aortic calcifications are noted. The lungs are well aerated bilaterally. No bony abnormality is seen. IMPRESSION: No acute abnormality noted. Electronically Signed   By: Inez Catalina M.D.   On: 08/03/2021 20:01   CT HEAD WO CONTRAST (5MM)  Result Date: 08/03/2021 CLINICAL DATA:  Status post fall. EXAM: CT HEAD WITHOUT CONTRAST TECHNIQUE: Contiguous axial images were obtained from the base of the skull through the vertex without intravenous contrast. RADIATION DOSE REDUCTION: This exam was performed according to the departmental dose-optimization program which includes automated exposure control, adjustment of the mA and/or kV according to patient size and/or use of iterative reconstruction technique. COMPARISON:  October 13, 2019 FINDINGS: Brain: There is mild cerebral atrophy with widening of the extra-axial spaces and ventricular dilatation. There are areas of decreased attenuation within the white matter tracts of the  supratentorial brain, consistent with microvascular disease changes. Vascular: No hyperdense vessel or unexpected calcification. Skull: Normal. Negative for fracture or focal lesion. Sinuses/Orbits: No acute finding. Other: None. IMPRESSION: 1. Generalized cerebral atrophy. 2. No acute intracranial abnormality. Electronically Signed   By: Virgina Norfolk M.D.   On: 08/03/2021 20:18   CT Cervical Spine Wo Contrast  Result Date: 08/03/2021 CLINICAL DATA:  Status post fall. EXAM: CT CERVICAL SPINE WITHOUT CONTRAST TECHNIQUE: Multidetector CT imaging of the cervical spine was performed without intravenous contrast. Multiplanar CT image reconstructions were also generated. RADIATION DOSE REDUCTION: This exam was performed according to the departmental dose-optimization program which includes automated exposure control, adjustment of the mA and/or kV according to patient size and/or use of iterative reconstruction technique. COMPARISON:  None. FINDINGS: Alignment: There is approximately 1 mm anterolisthesis of the C4  vertebral body on C5. Skull base and vertebrae: No acute fracture. No primary bone lesion or focal pathologic process. Soft tissues and spinal canal: No prevertebral fluid or swelling. No visible canal hematoma. Disc levels: Mild multilevel endplate sclerosis is seen, slightly more prominent at the levels of C4-C5 and C5-C6. Mild anterior osteophyte formation is also noted at the levels of C3-C4, C4-C5 and C5-C6. There is marked severity narrowing of the anterior atlantoaxial articulation. Mild to moderate severity intervertebral disc space narrowing is seen at the level of C4-C5. Mild intervertebral disc space narrowing is present throughout the remainder of the cervical spine. Bilateral moderate to marked severity multilevel facet joint hypertrophy is noted. Upper chest: Negative. Other: None. IMPRESSION: 1. No acute fracture of the cervical spine. 2. Multilevel degenerative changes, as described  above, most prominent at the level of C4-C5. Electronically Signed   By: Virgina Norfolk M.D.   On: 08/03/2021 20:21   CT HIP RIGHT WO CONTRAST  Result Date: 08/03/2021 CLINICAL DATA:  Periprosthetic fracture on recent plain film EXAM: CT OF THE RIGHT HIP WITHOUT CONTRAST TECHNIQUE: Multidetector CT imaging of the right hip was performed according to the standard protocol. Multiplanar CT image reconstructions were also generated. RADIATION DOSE REDUCTION: This exam was performed according to the departmental dose-optimization program which includes automated exposure control, adjustment of the mA and/or kV according to patient size and/or use of iterative reconstruction technique. COMPARISON:  From earlier in the same day per FINDINGS: Bones/Joint/Cartilage Right hip prosthesis is noted in satisfactory position. Periprosthetic fracture is noted involving the greater trochanter. No lesser trochanter fracture is noted. No other fracture along the shaft of the prosthesis is seen. Prosthesis is well seated. Ligaments Suboptimally assessed by CT. Muscles and Tendons No definitive muscular abnormality is noted. Considerable scatter artifact is seen. Soft tissues Bladder is well distended a significant amount of air is noted within the bladder. Air is also noted within the wall of the bladder particularly in the superior wall the bladder best seen on coronal image number 16 of series 6. This would be consistent with emphysematous cystitis. IMPRESSION: Periprostatic fracture involving the greater trochanter. Well distended bladder with a considerable amount of air within. Additionally there are some findings consistent with air within the wall of the bladder. This would be consistent with emphysematous cystitis. Diverticulosis without definitive diverticulitis. Critical Value/emergent results were called by telephone at the time of interpretation on 08/03/2021 at 8:47 pm to Dr. Duffy Bruce , who verbally  acknowledged these results. Electronically Signed   By: Inez Catalina M.D.   On: 08/03/2021 20:48   DG Hip Unilat W or Wo Pelvis 2-3 Views Right  Result Date: 08/03/2021 CLINICAL DATA:  Recent fall with right hip pain, initial encounter EXAM: DG HIP (WITH OR WITHOUT PELVIS) 3V RIGHT COMPARISON:  08/02/2011 FINDINGS: Right hip replacement is noted and stable. Pelvic ring is intact. Acute periprosthetic fracture is noted in the region of the greater trochanter new from the prior exam. No soft tissue abnormality is seen. IMPRESSION: Fracture involving the greater trochanter in a periprosthetic location. Electronically Signed   By: Inez Catalina M.D.   On: 08/03/2021 19:59     Scheduled Meds:  amLODipine  5 mg Oral Daily   aspirin EC  81 mg Oral Daily   dorzolamide  1 drop Both Eyes BID   feeding supplement  237 mL Oral Q24H   gabapentin  100 mg Oral BID   heparin injection (subcutaneous)  5,000 Units Subcutaneous Q8H  irbesartan  150 mg Oral Daily   And   hydrochlorothiazide  25 mg Oral Daily   latanoprost  1 drop Both Eyes QHS   levothyroxine  100 mcg Oral Q0600   multivitamin with minerals  1 tablet Oral Daily   mupirocin ointment  1 application Nasal BID   nortriptyline  25 mg Oral QHS   pantoprazole  40 mg Oral Daily   pravastatin  10 mg Oral q1800   cyanocobalamin  1,000 mcg Oral Daily   Continuous Infusions:   LOS: 2 days     Enzo Bi, MD [facility] If 7PM-7AM, please contact night-coverage 08/05/2021, 6:15 PM

## 2021-08-05 NOTE — Care Management Important Message (Signed)
Important Message  Patient Details  Name: Robyn Butler MRN: [id number] Date of Birth: 29-Dec-1928   Medicare Important Message Given:  Yes     Juliann Pulse A Jacorey Donaway 08/05/2021, 11:42 AM

## 2021-08-05 NOTE — Progress Notes (Signed)
Patient reports pain is improved. She was able to ambulate today with physical therapy. Selected [facility] for discharge.   RLE: 5/5 DF/PF/EHL SILT grossly over foot Foot wwp Negative log roll (only mild pain), negative axial load Mild-Moderate TTP over greater trochanter Able to flex/IR/ER hip with mild pain  88 year old female with minimally displaced greater trochanteric fracture in the setting of a right hip hemiarthroplasty.  Fracture itself does not appear to involve the implant. Given fracture pattern and current physical exam, we can continue nonoperative management. WBAT on RLE. Avoid active hip abduction PT/OT for mobilization Plan for discharge to SNF. Patient can follow up with [facility] in ~2 weeks. Will follow peripherally. Please page with questions.

## 2021-08-05 NOTE — Progress Notes (Signed)
Occupational Therapy Treatment Patient Details Name: Robyn Butler MRN: [id number] DOB: 1928-08-08 Today's Date: 08/05/2021   History of present illness Robyn Butler is a 88 y.o. female with medical history significant for HTN,  COPD,DM, CKD 3A, hypothyroidism, Anemia of CKD on  IV Venofer,  independent at baseline and with prior right hip repair who Presented to the emergency room following an accidental fall onto her right hip as she was getting back in the car after taking it to the car wash. Found to have periprosthetic fracture around internal prosthetic right hip joint, managed nonoperatively.   OT comments  Ms Aron was seen for OT treatment on this date. Upon arrival to room pt reclined in chair, agreeable to tx. Pt verbalizes hip precaution (no active abduction), however continues to requires cues on how to implement functionally. CGA + RW sit<>stand at chair. SBA tooth brushing standing sink side with single UE support. Returned to chair, ambulated ~30 ft. Pt making good progress toward goals. Pt continues to benefit from skilled OT services to maximize return to PLOF and minimize risk of future falls, injury, caregiver burden, and readmission. Will continue to follow POC. Discharge recommendation remains appropriate.     Recommendations for follow up therapy are one component of a multi-disciplinary discharge planning process, led by the attending physician.  Recommendations may be updated based on patient status, additional functional criteria and insurance authorization.    Follow Up Recommendations  Skilled nursing-short term rehab (<3 hours/day)    Assistance Recommended at Discharge Frequent or constant Supervision/Assistance  Patient can return home with the following  A little help with bathing/dressing/bathroom;Assistance with cooking/housework;Help with stairs or ramp for entrance   Equipment Recommendations  BSC/3in1    Recommendations for Other Services       Precautions / Restrictions Precautions Precautions: Fall Restrictions Weight Bearing Restrictions: Yes RLE Weight Bearing: Weight bearing as tolerated       Mobility Bed Mobility               General bed mobility comments: received and left in chair    Transfers Overall transfer level: Needs assistance Equipment used: Rolling walker (2 wheels) Transfers: Sit to/from Stand Sit to Stand: Min guard                 Balance Overall balance assessment: Needs assistance Sitting-balance support: No upper extremity supported, Feet supported Sitting balance-Leahy Scale: Good     Standing balance support: Single extremity supported, During functional activity Standing balance-Leahy Scale: Fair                             ADL either performed or assessed with clinical judgement   ADL Overall ADL's : Needs assistance/impaired                                       General ADL Comments: CGA + RW for ADL t/f. SBA grooming standing sink side.      Cognition Arousal/Alertness: Awake/alert Behavior During Therapy: WFL for tasks assessed/performed Overall Cognitive Status: Within Functional Limits for tasks assessed  Pertinent Vitals/ Pain       Pain Assessment Pain Assessment: 0-10 Pain Score: 2  Pain Location: R hip Pain Descriptors / Indicators: Discomfort, Dull, Sore Pain Intervention(s): Limited activity within patient's tolerance   Frequency  Min 3X/week        Progress Toward Goals  OT Goals(current goals can now be found in the care plan section)  Progress towards OT goals: Progressing toward goals  Acute Rehab OT Goals Patient Stated Goal: to feel better OT Goal Formulation: With patient Time For Goal Achievement: 08/18/21 Potential to Achieve Goals: Good ADL Goals Pt Will Perform Grooming: with supervision;standing Pt Will Perform Lower  Body Dressing: with supervision;sit to/from stand Pt Will Transfer to Toilet: with modified independence;ambulating;regular height toilet  Plan Discharge plan remains appropriate;Frequency remains appropriate    Co-evaluation                 AM-PAC OT "6 Clicks" Daily Activity     Outcome Measure   Help from another person eating meals?: None Help from another person taking care of personal grooming?: A Little Help from another person toileting, which includes using toliet, bedpan, or urinal?: A Little Help from another person bathing (including washing, rinsing, drying)?: A Little Help from another person to put on and taking off regular upper body clothing?: None Help from another person to put on and taking off regular lower body clothing?: A Lot 6 Click Score: 19    End of Session Equipment Utilized During Treatment: Rolling walker (2 wheels)  OT Visit Diagnosis: Other abnormalities of gait and mobility (R26.89)   Activity Tolerance Patient tolerated treatment well   Patient Left in chair;with call bell/phone within reach   Nurse Communication Mobility status        Time: 3335-4562 OT Time Calculation (min): 11 min  Charges: OT General Charges $OT Visit: 1 Visit OT Treatments $Self Care/Home Management : 8-22 mins  Dessie Coma, M.S. OTR/L  08/05/21, 12:39 PM  ascom [phone]

## 2021-08-05 NOTE — Progress Notes (Signed)
Physical Therapy Treatment Patient Details Name: Robyn Butler MRN: [id number] DOB: [birth year] Today's Date: 08/05/2021   History of Present Illness Robyn Butler is a 88 y.o. female with medical history significant for HTN,  COPD,DM, CKD 3A, hypothyroidism, Anemia of CKD on  IV Venofer,  independent at baseline and with prior right hip repair who Presented to the emergency room following an accidental fall onto her right hip as she was getting back in the car after taking it to the car wash. Found to have periprosthetic fracture around internal prosthetic right hip joint, managed nonoperatively.    PT Comments    Pt received in recliner agreeable to PT services. Able to report R hip precautions. Able to stand with minguard to RW and progress ambulation to 17' with RW and minguard to supervision. Pt required min VC's and education for RW sequencing and attempts to progress to step through pattern. Good carryover with RW sequencing but remains with step to pattern due to discomfort. Pt returned to recliner and exhibited good motivation and form/technique with exercises while maintaining precautions. Pt left with all needs in place with family present. D/c recs remain appropriate at this time.    Recommendations for follow up therapy are one component of a multi-disciplinary discharge planning process, led by the attending physician.  Recommendations may be updated based on patient status, additional functional criteria and insurance authorization.  Follow Up Recommendations  Skilled nursing-short term rehab (<3 hours/day)     Assistance Recommended at Discharge    Patient can return home with the following A lot of help with walking and/or transfers;A lot of help with bathing/dressing/bathroom;Assistance with cooking/housework;Help with stairs or ramp for entrance;Direct supervision/assist for medications management   Equipment Recommendations  BSC/3in1    Recommendations for Other Services        Precautions / Restrictions Precautions Precautions: Fall Restrictions Weight Bearing Restrictions: Yes RLE Weight Bearing: Weight bearing as tolerated Other Position/Activity Restrictions: no R hip abduction     Mobility  Bed Mobility               General bed mobility comments: received and left in chair Patient Response: Cooperative  Transfers Overall transfer level: Needs assistance Equipment used: Rolling walker (2 wheels) Transfers: Sit to/from Stand Sit to Stand: Min guard                Ambulation/Gait Ambulation/Gait assistance: Supervision Gait Distance (Feet): 38 Feet Assistive device: Rolling walker (2 wheels) Gait Pattern/deviations: Step-to pattern, Decreased step length - right, Decreased step length - left, Decreased stride length, Narrow base of support       General Gait Details: Requirde min VC's for sequencing RW with steps. Remains need for step to pattern due to discomfort.   Stairs             Wheelchair Mobility    Modified Rankin (Stroke Patients Only)       Balance Overall balance assessment: Needs assistance Sitting-balance support: No upper extremity supported, Feet supported Sitting balance-Leahy Scale: Good     Standing balance support: Single extremity supported, During functional activity Standing balance-Leahy Scale: Fair                              Cognition Arousal/Alertness: Awake/alert Behavior During Therapy: WFL for tasks assessed/performed Overall Cognitive Status: Within Functional Limits for tasks assessed  Exercises General Exercises - Lower Extremity Ankle Circles/Pumps: AROM, Seated, Strengthening, Both, 10 reps Quad Sets: AROM, Strengthening, Right, 10 reps, Seated Gluteal Sets: AROM, Strengthening, Both, 10 reps Other Exercises Other Exercises: Hip adduction isometric with pillow: 2-3 sec H, x10 reps     General Comments        Pertinent Vitals/Pain Pain Assessment Pain Assessment: Faces Faces Pain Scale: Hurts a little bit Pain Location: R hip Pain Descriptors / Indicators: Discomfort, Dull, Sore Pain Intervention(s): Limited activity within patient's tolerance, Monitored during session, Repositioned    Home Living                          Prior Function            PT Goals (current goals can now be found in the care plan section) Acute Rehab PT Goals Patient Stated Goal: decrease R hip pain in order to go home PT Goal Formulation: With patient Time For Goal Achievement: 08/18/21 Potential to Achieve Goals: Good Progress towards PT goals: Progressing toward goals    Frequency    7X/week      PT Plan Current plan remains appropriate    Co-evaluation              AM-PAC PT "6 Clicks" Mobility   Outcome Measure  Help needed turning from your back to your side while in a flat bed without using bedrails?: A Little Help needed moving from lying on your back to sitting on the side of a flat bed without using bedrails?: A Little Help needed moving to and from a bed to a chair (including a wheelchair)?: A Little Help needed standing up from a chair using your arms (e.g., wheelchair or bedside chair)?: A Little Help needed to walk in hospital room?: A Lot Help needed climbing 3-5 steps with a railing? : Total 6 Click Score: 15    End of Session Equipment Utilized During Treatment: Gait belt Activity Tolerance: Patient tolerated treatment well Patient left: in chair;with family/visitor present;with call bell/phone within reach Nurse Communication: Mobility status PT Visit Diagnosis: Unsteadiness on feet (R26.81);Other abnormalities of gait and mobility (R26.89);Muscle weakness (generalized) (M62.81);Pain Pain - Right/Left: Right Pain - part of body: Hip     Time: 1525-1540 PT Time Calculation (min) (ACUTE ONLY): 15 min  Charges:  $Therapeutic  Exercise: 8-22 mins                     Robyn Butler. Fairly IV, PT, DPT Physical Therapist- [facility]  08/05/2021, 4:10 PM

## 2021-08-05 NOTE — TOC Progression Note (Signed)
Transition of Care [facility]) - Progression Note    Patient Details  Name: Robyn Butler MRN: [id number] Date of Birth: 06/13/1929  Transition of Care [facility]) CM/SW [place], RN Phone Number: 08/05/2021, 10:01 AM  Clinical Narrative:   Met with the patient and her daughter at the bedside, Reviewed the bed offers, they chose [place], I notified Seth Bake at [facility], I called [facility] Oncall and spoke to [place] requested ins approval for STR SNF and EMS          Expected Discharge Plan and Services                                                 Social Determinants of Health (SDOH) Interventions    Readmission Risk Interventions No flowsheet data found.

## 2021-08-05 NOTE — TOC Progression Note (Signed)
Transition of Care [facility]) - Progression Note    Patient Details  Name: Robyn Butler MRN: [id number] Date of Birth: 01-02-1929  Transition of Care [facility]) CM/SW [place], RN Phone Number: 08/05/2021, 2:44 PM  Clinical Narrative:    [facility] will accept the patient on Sat, DC summary needed prior to noon, will go EMS for transport        Expected Discharge Plan and Services                                                 Social Determinants of Health (SDOH) Interventions    Readmission Risk Interventions No flowsheet data found.

## 2021-08-05 NOTE — TOC Progression Note (Signed)
Transition of Care [facility]) - Progression Note    Patient Details  Name: Robyn Butler MRN: [id number] Date of Birth: October 01, 1928  Transition of Care [facility]) CM/SW [place], RN Phone Number: 08/05/2021, 12:38 PM  Clinical Narrative:   Received notice that the ins is approved, EMS auth [id number], [place] [phone]          Expected Discharge Plan and Services                                                 Social Determinants of Health (SDOH) Interventions    Readmission Risk Interventions No flowsheet data found.

## 2021-08-06 DIAGNOSIS — E039 Hypothyroidism, unspecified: Secondary | ICD-10-CM | POA: Diagnosis not present

## 2021-08-06 DIAGNOSIS — M9701XS Periprosthetic fracture around internal prosthetic right hip joint, sequela: Secondary | ICD-10-CM | POA: Diagnosis not present

## 2021-08-06 DIAGNOSIS — W19XXXD Unspecified fall, subsequent encounter: Secondary | ICD-10-CM | POA: Diagnosis not present

## 2021-08-06 DIAGNOSIS — N1831 Chronic kidney disease, stage 3a: Secondary | ICD-10-CM | POA: Diagnosis not present

## 2021-08-06 DIAGNOSIS — M6281 Muscle weakness (generalized): Secondary | ICD-10-CM | POA: Diagnosis not present

## 2021-08-06 DIAGNOSIS — M810 Age-related osteoporosis without current pathological fracture: Secondary | ICD-10-CM | POA: Diagnosis not present

## 2021-08-06 DIAGNOSIS — E1122 Type 2 diabetes mellitus with diabetic chronic kidney disease: Secondary | ICD-10-CM | POA: Diagnosis not present

## 2021-08-06 DIAGNOSIS — I10 Essential (primary) hypertension: Secondary | ICD-10-CM | POA: Diagnosis not present

## 2021-08-06 DIAGNOSIS — Z741 Need for assistance with personal care: Secondary | ICD-10-CM | POA: Diagnosis not present

## 2021-08-06 DIAGNOSIS — J449 Chronic obstructive pulmonary disease, unspecified: Secondary | ICD-10-CM | POA: Diagnosis not present

## 2021-08-06 DIAGNOSIS — M9701XA Periprosthetic fracture around internal prosthetic right hip joint, initial encounter: Secondary | ICD-10-CM | POA: Diagnosis not present

## 2021-08-06 DIAGNOSIS — I129 Hypertensive chronic kidney disease with stage 1 through stage 4 chronic kidney disease, or unspecified chronic kidney disease: Secondary | ICD-10-CM | POA: Diagnosis not present

## 2021-08-06 DIAGNOSIS — E1142 Type 2 diabetes mellitus with diabetic polyneuropathy: Secondary | ICD-10-CM | POA: Diagnosis not present

## 2021-08-06 DIAGNOSIS — H409 Unspecified glaucoma: Secondary | ICD-10-CM | POA: Diagnosis not present

## 2021-08-06 DIAGNOSIS — R2689 Other abnormalities of gait and mobility: Secondary | ICD-10-CM | POA: Diagnosis not present

## 2021-08-06 DIAGNOSIS — Z7401 Bed confinement status: Secondary | ICD-10-CM | POA: Diagnosis not present

## 2021-08-06 DIAGNOSIS — R531 Weakness: Secondary | ICD-10-CM | POA: Diagnosis not present

## 2021-08-06 DIAGNOSIS — K219 Gastro-esophageal reflux disease without esophagitis: Secondary | ICD-10-CM | POA: Diagnosis not present

## 2021-08-06 DIAGNOSIS — N1832 Chronic kidney disease, stage 3b: Secondary | ICD-10-CM | POA: Diagnosis not present

## 2021-08-06 DIAGNOSIS — R278 Other lack of coordination: Secondary | ICD-10-CM | POA: Diagnosis not present

## 2021-08-06 DIAGNOSIS — I959 Hypotension, unspecified: Secondary | ICD-10-CM | POA: Diagnosis not present

## 2021-08-06 DIAGNOSIS — E785 Hyperlipidemia, unspecified: Secondary | ICD-10-CM | POA: Diagnosis not present

## 2021-08-06 DIAGNOSIS — N183 Chronic kidney disease, stage 3 unspecified: Secondary | ICD-10-CM | POA: Diagnosis not present

## 2021-08-06 DIAGNOSIS — Z9181 History of falling: Secondary | ICD-10-CM | POA: Diagnosis not present

## 2021-08-06 DIAGNOSIS — M9701XD Periprosthetic fracture around internal prosthetic right hip joint, subsequent encounter: Secondary | ICD-10-CM | POA: Diagnosis not present

## 2021-08-06 DIAGNOSIS — E1140 Type 2 diabetes mellitus with diabetic neuropathy, unspecified: Secondary | ICD-10-CM | POA: Diagnosis not present

## 2021-08-06 DIAGNOSIS — S72114A Nondisplaced fracture of greater trochanter of right femur, initial encounter for closed fracture: Secondary | ICD-10-CM | POA: Diagnosis not present

## 2021-08-06 LAB — CBC
HCT: 25.6 % — ABNORMAL LOW (ref 36.0–46.0)
Hemoglobin: 7.7 g/dL — ABNORMAL LOW (ref 12.0–15.0)
MCH: 29.7 pg (ref 26.0–34.0)
MCHC: 30.1 g/dL (ref 30.0–36.0)
MCV: 98.8 fL (ref 80.0–100.0)
Platelets: 223 10*3/uL (ref 150–400)
RBC: 2.59 MIL/uL — ABNORMAL LOW (ref 3.87–5.11)
RDW: 13.2 % (ref 11.5–15.5)
WBC: 7.2 10*3/uL (ref 4.0–10.5)
nRBC: 0 % (ref 0.0–0.2)

## 2021-08-06 LAB — GLUCOSE, CAPILLARY: Glucose-Capillary: 129 mg/dL — ABNORMAL HIGH (ref 70–99)

## 2021-08-06 LAB — URINE CULTURE: Culture: >=100000 — AB

## 2021-08-06 LAB — BASIC METABOLIC PANEL
Anion gap: 12 (ref 5–15)
BUN: 38 mg/dL — ABNORMAL HIGH (ref 8–23)
CO2: 23 mmol/L (ref 22–32)
Calcium: 8.8 mg/dL — ABNORMAL LOW (ref 8.9–10.3)
Chloride: 104 mmol/L (ref 98–111)
Creatinine, Ser: 1.14 mg/dL — ABNORMAL HIGH (ref 0.44–1.00)
GFR, Estimated: 46 mL/min — ABNORMAL LOW (ref >60)
Glucose, Bld: 139 mg/dL — ABNORMAL HIGH (ref 70–99)
Potassium: 4.5 mmol/L (ref 3.5–5.1)
Sodium: 139 mmol/L (ref 135–145)

## 2021-08-06 LAB — RESP PANEL BY RT-PCR (FLU A&B, COVID) ARPGX2
Influenza A by PCR: NEGATIVE
Influenza B by PCR: NEGATIVE
SARS Coronavirus 2 by RT PCR: NEGATIVE

## 2021-08-06 LAB — MAGNESIUM: Magnesium: 2.2 mg/dL (ref 1.7–2.4)

## 2021-08-06 MED ORDER — HYDROCODONE-ACETAMINOPHEN 5-325 MG PO TABS: 1.0000 | ORAL_TABLET | Freq: Four times a day (QID) | ORAL | 0 refills | Status: AC | PRN

## 2021-08-06 MED ORDER — ENSURE ENLIVE PO LIQD: 237.0000 mL | ORAL | 12 refills | Status: DC

## 2021-08-06 NOTE — Progress Notes (Signed)
Contacted [facility] and spoke/reported with facility nurse, [place], [place].

## 2021-08-06 NOTE — TOC Transition Note (Signed)
Transition of Care [facility]) - CM/SW Discharge Note   Patient Details  Name: Robyn Butler MRN: [id number] Date of Birth: 1929/04/27  Transition of Care [facility]) CM/SW Contact:  Boris Sharper, LCSW Phone Number: 08/06/2021, 12:00 PM   Clinical Narrative:    Pt is medically stabl;e for discharge per MD. Pt will be transported to [facility] via [facility] once covid screening comes back. Pt will be going to room 111 and call to report number is [phone]. CSW will arrange transport once pt is ready.   Final next level of care: [place] Barriers to Discharge: SNF Covid   Patient Goals and CMS Choice        Discharge Placement              Patient chooses bed at: [facility] Patient to be transferred to facility by: [facility] Name of family member notified: Thayer Headings Patient and family notified of of transfer: 08/06/21  Discharge Plan and Services                                     Social Determinants of Health (SDOH) Interventions     Readmission Risk Interventions No flowsheet data found.

## 2021-08-06 NOTE — Progress Notes (Signed)
EMS transported pt from [facility] to [place] at this time.

## 2021-08-06 NOTE — TOC Transition Note (Signed)
Transition of Care [facility]) - CM/SW Discharge Note   Patient Details  Name: Robyn Butler MRN: [id number] Date of Birth: 1929-07-10  Transition of Care [facility]) CM/SW Contact:  Boris Sharper, LCSW Phone Number: 08/06/2021, 3:10 PM   Clinical Narrative:    [facility] transport had been arranged   Final next level of care: [place] Barriers to Discharge: SNF Covid   Patient Goals and CMS Choice        Discharge Placement              Patient chooses bed at: [facility] Patient to be transferred to facility by: [facility] Name of family member notified: Thayer Headings Patient and family notified of of transfer: 08/06/21  Discharge Plan and Services                                     Social Determinants of Health (SDOH) Interventions     Readmission Risk Interventions No flowsheet data found.

## 2021-08-06 NOTE — Discharge Summary (Addendum)
Physician Discharge Summary   Robyn Butler  female DOB: 1928-08-25  EOF:[id number]  PCP: Kirk Ruths, MD  Admit date: 08/03/2021 Discharge date: 08/06/2021  Admitted From: home Disposition:  SNF CODE STATUS: Full code  Discharge Instructions     No wound care   Complete by: As directed        Hospital Course:  For full details, please see H&P, progress notes, consult notes and ancillary notes.  Briefly,  Robyn Butler is a 88 y.o. female with medical history significant for HTN, COPD, DM2, CKD 3A, hypothyroidism, Anemia of CKD, independent at baseline and with prior right hip repair who Presented to the emergency room following an accidental fall onto her right hip as she was getting back in the car after taking it to the car wash.       Periprosthetic fracture around internal prosthetic right hip joint secondary to accidental fall ([place]) --ortho rec non-operative management --WBAT on RLE. Avoid active hip abduction. --PT/OT rec SNF rehab --Norco PRN for pain --outpatient f/u with ortho Dr. Leim Fabry 2 weeks after discharge.   Asymptomatic bacteruria  -not symptomatic  - Patient received a dose of Rocephin in the ED, not continued      Anemia due to stage 3a chronic kidney disease ([place]) -On Venofer IV, last  received, saw oncology on 1/6  - Renal function at baseline     COPD (chronic obstructive pulmonary disease) (HCC) --stable     Essential (primary) hypertension --cont home amlodipine and valsartan-hydrochlorothiazide      Adult hypothyroidism --cont Synthroid     Diabetes mellitus without complication ([place]), well controlled --A1c 5.9.  No need for daily blood sugar checks.  Pressure Injury Coccyx Medial Stage 1, POA   Discharge Diagnoses:  Principal Problem:   Periprosthetic fracture around internal prosthetic right hip joint ([place]) Active Problems:   Anemia due to stage 3a chronic kidney disease (HCC)   COPD (chronic obstructive  pulmonary disease) (HCC)   Essential (primary) hypertension   Adult hypothyroidism   Diabetes mellitus without complication (HCC)   Preoperative clearance   Closed right hip fracture (HCC)   Pressure injury of skin   30 Day Unplanned Readmission Risk Score    Flowsheet Row ED to Hosp-Admission (Current) from 08/03/2021 in [place] (1A)  30 Day Unplanned Readmission Risk Score (%) 18.96 Filed at 08/06/2021 0800       This score is the patient's risk of an unplanned readmission within 30 days of being discharged (0 -100%). The score is based on dignosis, age, lab data, medications, orders, and past utilization.   Low:  0-14.9   Medium: 15-21.9   High: 22-29.9   Extreme: 30 and above         Discharge Instructions:  Allergies as of 08/06/2021       Reactions   Alendronate Nausea And Vomiting   Other reaction(s): Vomiting        Medication List     STOP taking these medications    ONE TOUCH ULTRA 2 w/Device Kit   OneTouch Ultra test strip Generic drug: glucose blood   onetouch ultrasoft lancets   traMADol 50 MG tablet Commonly known as: ULTRAM       TAKE these medications    amLODipine 5 MG tablet Commonly known as: NORVASC TAKE ONE(1) TABLET EACH DAY What changed: See the new instructions.   aspirin EC 81 MG tablet Take 81 mg by mouth daily.  Cranberry 500 MG Caps Take 500 mg by mouth daily.   cyanocobalamin 1000 MCG tablet Take 1,000 mcg by mouth daily.   dorzolamide 2 % ophthalmic solution Commonly known as: TRUSOPT Place 1 drop into both eyes 2 (two) times daily.   DULCOLAX BALANCE PO Take 2 tablets by mouth as needed.   feeding supplement Liqd Take 237 mLs by mouth daily.   gabapentin 100 MG capsule Commonly known as: NEURONTIN Take 1 capsule by mouth 2 (two) times daily.   HYDROcodone-acetaminophen 5-325 MG tablet Commonly known as: NORCO/VICODIN Take 1 tablet by mouth every 6 (six) hours as  needed for up to 5 days for moderate pain.   ibandronate 150 MG tablet Commonly known as: BONIVA TAKE ONE TABLET ONCE A MONTH FIRST THING IN THE MORNING AT LEAST 1 HOUR BEFORE EATING, TAKE WITH WATER.   latanoprost 0.005 % ophthalmic solution Commonly known as: XALATAN Place 1 drop into both eyes at bedtime.   levothyroxine 100 MCG tablet Commonly known as: SYNTHROID TAKE 1 TABLET BY MOUTH EVERY DAY   loperamide 2 MG capsule Commonly known as: IMODIUM Take 2 mg by mouth as needed for diarrhea or loose stools.   lovastatin 40 MG tablet Commonly known as: MEVACOR TAKE 1 TABLET BY MOUTH AT BEDTIME   magnesium oxide 400 MG tablet Commonly known as: MAG-OX Take 400 mg by mouth daily.   nortriptyline 25 MG capsule Commonly known as: PAMELOR TAKE 1 CAPSULE(25 MG) BY MOUTH AT BEDTIME   omeprazole 40 MG capsule Commonly known as: PRILOSEC Take 1 capsule (40 mg total) by mouth daily.   PreserVision AREDS 2 Caps Take 1 capsule by mouth 2 (two) times daily.   Spiriva Respimat 2.5 MCG/ACT Aers Generic drug: Tiotropium Bromide Monohydrate SMARTSIG:2 Puff(s) By Mouth Daily   valsartan-hydrochlorothiazide 160-25 MG tablet Commonly known as: DIOVAN-HCT Take 1 tablet by mouth daily.   Vitamin D3 50 MCG (2000 UT) Tabs Take 1 tablet by mouth daily.   zinc sulfate 220 (50 Zn) MG capsule Take 220 mg by mouth daily.         Contact information for follow-up providers     Leim Fabry, MD Follow up in 2 week(s).   Specialty: Orthopedic Surgery Contact information: [place] [address] [phone]              Contact information for after-discharge care     Destination     [facility] .   Service: Skilled Nursing Contact information: [place] [address] [phone]                     Allergies  Allergen Reactions   Alendronate Nausea And Vomiting    Other reaction(s):  Vomiting     The results of significant diagnostics from this hospitalization (including imaging, microbiology, ancillary and laboratory) are listed below for reference.   Consultations:   Procedures/Studies: DG Chest 1 View  Result Date: 08/03/2021 CLINICAL DATA:  Known periprosthetic hip fracture, initial encounter EXAM: CHEST  1 VIEW COMPARISON:  09/26/2019 FINDINGS: Cardiac shadow is within normal limits. Aortic calcifications are noted. The lungs are well aerated bilaterally. No bony abnormality is seen. IMPRESSION: No acute abnormality noted. Electronically Signed   By: Inez Catalina M.D.   On: 08/03/2021 20:01   CT HEAD WO CONTRAST (5MM)  Result Date: 08/03/2021 CLINICAL DATA:  Status post fall. EXAM: CT HEAD WITHOUT CONTRAST TECHNIQUE: Contiguous axial images were obtained  from the base of the skull through the vertex without intravenous contrast. RADIATION DOSE REDUCTION: This exam was performed according to the departmental dose-optimization program which includes automated exposure control, adjustment of the mA and/or kV according to patient size and/or use of iterative reconstruction technique. COMPARISON:  October 13, 2019 FINDINGS: Brain: There is mild cerebral atrophy with widening of the extra-axial spaces and ventricular dilatation. There are areas of decreased attenuation within the white matter tracts of the supratentorial brain, consistent with microvascular disease changes. Vascular: No hyperdense vessel or unexpected calcification. Skull: Normal. Negative for fracture or focal lesion. Sinuses/Orbits: No acute finding. Other: None. IMPRESSION: 1. Generalized cerebral atrophy. 2. No acute intracranial abnormality. Electronically Signed   By: Virgina Norfolk M.D.   On: 08/03/2021 20:18   CT Cervical Spine Wo Contrast  Result Date: 08/03/2021 CLINICAL DATA:  Status post fall. EXAM: CT CERVICAL SPINE WITHOUT CONTRAST TECHNIQUE: Multidetector CT imaging of the cervical spine was  performed without intravenous contrast. Multiplanar CT image reconstructions were also generated. RADIATION DOSE REDUCTION: This exam was performed according to the departmental dose-optimization program which includes automated exposure control, adjustment of the mA and/or kV according to patient size and/or use of iterative reconstruction technique. COMPARISON:  None. FINDINGS: Alignment: There is approximately 1 mm anterolisthesis of the C4 vertebral body on C5. Skull base and vertebrae: No acute fracture. No primary bone lesion or focal pathologic process. Soft tissues and spinal canal: No prevertebral fluid or swelling. No visible canal hematoma. Disc levels: Mild multilevel endplate sclerosis is seen, slightly more prominent at the levels of C4-C5 and C5-C6. Mild anterior osteophyte formation is also noted at the levels of C3-C4, C4-C5 and C5-C6. There is marked severity narrowing of the anterior atlantoaxial articulation. Mild to moderate severity intervertebral disc space narrowing is seen at the level of C4-C5. Mild intervertebral disc space narrowing is present throughout the remainder of the cervical spine. Bilateral moderate to marked severity multilevel facet joint hypertrophy is noted. Upper chest: Negative. Other: None. IMPRESSION: 1. No acute fracture of the cervical spine. 2. Multilevel degenerative changes, as described above, most prominent at the level of C4-C5. Electronically Signed   By: Virgina Norfolk M.D.   On: 08/03/2021 20:21   CT HIP RIGHT WO CONTRAST  Result Date: 08/03/2021 CLINICAL DATA:  Periprosthetic fracture on recent plain film EXAM: CT OF THE RIGHT HIP WITHOUT CONTRAST TECHNIQUE: Multidetector CT imaging of the right hip was performed according to the standard protocol. Multiplanar CT image reconstructions were also generated. RADIATION DOSE REDUCTION: This exam was performed according to the departmental dose-optimization program which includes automated exposure control,  adjustment of the mA and/or kV according to patient size and/or use of iterative reconstruction technique. COMPARISON:  From earlier in the same day per FINDINGS: Bones/Joint/Cartilage Right hip prosthesis is noted in satisfactory position. Periprosthetic fracture is noted involving the greater trochanter. No lesser trochanter fracture is noted. No other fracture along the shaft of the prosthesis is seen. Prosthesis is well seated. Ligaments Suboptimally assessed by CT. Muscles and Tendons No definitive muscular abnormality is noted. Considerable scatter artifact is seen. Soft tissues Bladder is well distended a significant amount of air is noted within the bladder. Air is also noted within the wall of the bladder particularly in the superior wall the bladder best seen on coronal image number 16 of series 6. This would be consistent with emphysematous cystitis. IMPRESSION: Periprostatic fracture involving the greater trochanter. Well distended bladder with a considerable amount of air  within. Additionally there are some findings consistent with air within the wall of the bladder. This would be consistent with emphysematous cystitis. Diverticulosis without definitive diverticulitis. Critical Value/emergent results were called by telephone at the time of interpretation on 08/03/2021 at 8:47 pm to Dr. Duffy Bruce , who verbally acknowledged these results. Electronically Signed   By: Inez Catalina M.D.   On: 08/03/2021 20:48   DG Hip Unilat W or Wo Pelvis 2-3 Views Right  Result Date: 08/03/2021 CLINICAL DATA:  Recent fall with right hip pain, initial encounter EXAM: DG HIP (WITH OR WITHOUT PELVIS) 3V RIGHT COMPARISON:  08/02/2011 FINDINGS: Right hip replacement is noted and stable. Pelvic ring is intact. Acute periprosthetic fracture is noted in the region of the greater trochanter new from the prior exam. No soft tissue abnormality is seen. IMPRESSION: Fracture involving the greater trochanter in a periprosthetic  location. Electronically Signed   By: Inez Catalina M.D.   On: 08/03/2021 19:59      Labs: BNP (last 3 results) No results for input(s): BNP in the last 8760 hours. Basic Metabolic Panel: Recent Labs  Lab 08/03/21 1830 08/05/21 0607 08/06/21 0546  NA 137 136 139  K 5.0 4.8 4.5  CL 106 107 104  CO2 '26 26 23  ' GLUCOSE 112* 139* 139*  BUN 33* 32* 38*  CREATININE 1.04* 1.26* 1.14*  CALCIUM 9.1 8.5* 8.8*  MG  --  2.1 2.2   Liver Function Tests: No results for input(s): AST, ALT, ALKPHOS, BILITOT, PROT, ALBUMIN in the last 168 hours. No results for input(s): LIPASE, AMYLASE in the last 168 hours. No results for input(s): AMMONIA in the last 168 hours. CBC: Recent Labs  Lab 08/03/21 1830 08/05/21 0607 08/06/21 0546  WBC 6.7 7.8 7.2  NEUTROABS 4.0  --   --   HGB 9.5* 8.6* 7.7*  HCT 31.3* 28.0* 25.6*  MCV 98.7 97.2 98.8  PLT 237 232 223   Cardiac Enzymes: No results for input(s): CKTOTAL, CKMB, CKMBINDEX, TROPONINI in the last 168 hours. BNP: Invalid input(s): POCBNP CBG: Recent Labs  Lab 08/04/21 2043 08/05/21 0737 08/05/21 1144 08/05/21 1619 08/06/21 0837  GLUCAP 152* 123* 131* 149* 129*   D-Dimer No results for input(s): DDIMER in the last 72 hours. Hgb A1c Recent Labs    08/03/21 1830  HGBA1C 5.9*   Lipid Profile No results for input(s): CHOL, HDL, LDLCALC, TRIG, CHOLHDL, LDLDIRECT in the last 72 hours. Thyroid function studies No results for input(s): TSH, T4TOTAL, T3FREE, THYROIDAB in the last 72 hours.  Invalid input(s): FREET3 Anemia work up No results for input(s): VITAMINB12, FOLATE, FERRITIN, TIBC, IRON, RETICCTPCT in the last 72 hours. Urinalysis    Component Value Date/Time   COLORURINE YELLOW (A) 08/03/2021 2159   APPEARANCEUR HAZY (A) 08/03/2021 2159   APPEARANCEUR Hazy 11/08/2012 1006   LABSPEC 1.005 08/03/2021 2159   LABSPEC 1.015 11/08/2012 1006   PHURINE 7.0 08/03/2021 2159   GLUCOSEU NEGATIVE 08/03/2021 2159   GLUCOSEU Negative  11/08/2012 1006   HGBUR MODERATE (A) 08/03/2021 2159   BILIRUBINUR NEGATIVE 08/03/2021 2159   BILIRUBINUR Negative 11/08/2012 [place] 08/03/2021 2159   PROTEINUR 100 (A) 08/03/2021 2159   NITRITE NEGATIVE 08/03/2021 2159   LEUKOCYTESUR SMALL (A) 08/03/2021 2159   LEUKOCYTESUR Trace 11/08/2012 1006   Sepsis Labs Invalid input(s): PROCALCITONIN,  WBC,  LACTICIDVEN Microbiology Recent Results (from the past 240 hour(s))  Urine Culture     Status: Abnormal   Collection Time: 08/03/21  9:59 PM   Specimen: Urine, Random  Result Value Ref Range Status   Specimen Description   Final    URINE, RANDOM Performed at [facility], [address], [place], [place] [address]    Special Requests   Final    NONE Performed at [facility], [address] [place], [place] [address]    Culture (A)  Final    >=100,000 COLONIES/mL ESCHERICHIA COLI Two isolates with different morphologies were identified as the same organism.The most resistant organism was reported. Performed at [facility], [address] [place], [place] [address]    Report Status 08/06/2021 FINAL  Final   Organism ID, Bacteria ESCHERICHIA COLI (A)  Final      Susceptibility   Escherichia coli - MIC*    AMPICILLIN >=32 RESISTANT Resistant     CEFAZOLIN <=4 SENSITIVE Sensitive     CEFEPIME <=0.12 SENSITIVE Sensitive     CEFTRIAXONE <=0.25 SENSITIVE Sensitive     CIPROFLOXACIN <=0.25 SENSITIVE Sensitive     GENTAMICIN >=16 RESISTANT Resistant     IMIPENEM <=0.25 SENSITIVE Sensitive     NITROFURANTOIN <=16 SENSITIVE Sensitive     TRIMETH/SULFA >=320 RESISTANT Resistant     AMPICILLIN/SULBACTAM >=32 RESISTANT Resistant     PIP/TAZO <=4 SENSITIVE Sensitive     * >=100,000 COLONIES/mL ESCHERICHIA COLI  Resp Panel by RT-PCR (Flu A&B, Covid) Nasopharyngeal Swab     Status: None   Collection Time: 08/03/21 11:11 PM   Specimen: Nasopharyngeal Swab; Nasopharyngeal(NP) swabs in vial  transport medium  Result Value Ref Range Status   SARS Coronavirus 2 by RT PCR NEGATIVE NEGATIVE Final    Comment: (NOTE) SARS-CoV-2 target nucleic acids are NOT DETECTED.  The SARS-CoV-2 RNA is generally detectable in upper respiratory specimens during the acute phase of infection. The lowest concentration of SARS-CoV-2 viral copies this assay can detect is 138 copies/mL. A negative result does not preclude SARS-Cov-2 infection and should not be used as the sole basis for treatment or other patient management decisions. A negative result may occur with  improper specimen collection/handling, submission of specimen other than nasopharyngeal swab, presence of viral mutation(s) within the areas targeted by this assay, and inadequate number of viral copies(<138 copies/mL). A negative result must be combined with clinical observations, patient history, and epidemiological information. The expected result is Negative.  Fact Sheet for Patients:  EntrepreneurPulse.com.au  Fact Sheet for Healthcare Providers:  IncredibleEmployment.be  This test is no t yet approved or cleared by the [place] FDA and  has been authorized for detection and/or diagnosis of SARS-CoV-2 by FDA under an Emergency Use Authorization (EUA). This EUA will remain  in effect (meaning this test can be used) for the duration of the COVID-19 declaration under Section 564(b)(1) of the Act, 21 U.S.C.section 360bbb-3(b)(1), unless the authorization is terminated  or revoked sooner.       Influenza A by PCR NEGATIVE NEGATIVE Final   Influenza B by PCR NEGATIVE NEGATIVE Final    Comment: (NOTE) The Xpert Xpress SARS-CoV-2/FLU/RSV plus assay is intended as an aid in the diagnosis of influenza from Nasopharyngeal swab specimens and should not be used as a sole basis for treatment. Nasal washings and aspirates are unacceptable for Xpert Xpress SARS-CoV-2/FLU/RSV testing.  Fact  Sheet for Patients: EntrepreneurPulse.com.au  Fact Sheet for Healthcare Providers: IncredibleEmployment.be  This test is not yet approved or cleared by the [place] FDA and has been authorized for detection and/or diagnosis of SARS-CoV-2 by FDA  under an Emergency Use Authorization (EUA). This EUA will remain in effect (meaning this test can be used) for the duration of the COVID-19 declaration under Section 564(b)(1) of the Act, 21 U.S.C. section 360bbb-3(b)(1), unless the authorization is terminated or revoked.  Performed at [facility], [address] [place], [place] [address]   Surgical pcr screen     Status: None   Collection Time: 08/04/21  5:15 AM  Result Value Ref Range Status   MRSA, PCR NEGATIVE NEGATIVE Final   Staphylococcus aureus NEGATIVE NEGATIVE Final    Comment: (NOTE) The Xpert SA Assay (FDA approved for NASAL specimens in patients 55 years of age and older), is one component of a comprehensive surveillance program. It is not intended to diagnose infection nor to guide or monitor treatment. Performed at [facility], [address] [place], [place] [address]      Total time spend on discharging this patient, including the last patient exam, discussing the hospital stay, instructions for ongoing care as it relates to all pertinent caregivers, as well as preparing the medical discharge records, prescriptions, and/or referrals as applicable, is 25 minutes.    Enzo Bi, MD  [facility] 08/06/2021, 9:07 AM

## 2021-08-08 DIAGNOSIS — E1142 Type 2 diabetes mellitus with diabetic polyneuropathy: Secondary | ICD-10-CM | POA: Diagnosis not present

## 2021-08-08 DIAGNOSIS — I10 Essential (primary) hypertension: Secondary | ICD-10-CM | POA: Diagnosis not present

## 2021-08-08 DIAGNOSIS — M9701XA Periprosthetic fracture around internal prosthetic right hip joint, initial encounter: Secondary | ICD-10-CM | POA: Diagnosis not present

## 2021-08-08 DIAGNOSIS — N1832 Chronic kidney disease, stage 3b: Secondary | ICD-10-CM | POA: Diagnosis not present

## 2021-08-08 DIAGNOSIS — K219 Gastro-esophageal reflux disease without esophagitis: Secondary | ICD-10-CM | POA: Diagnosis not present

## 2021-08-11 ENCOUNTER — Other Ambulatory Visit: Payer: Self-pay | Admitting: *Deleted

## 2021-08-11 DIAGNOSIS — N1832 Chronic kidney disease, stage 3b: Secondary | ICD-10-CM

## 2021-08-11 DIAGNOSIS — D631 Anemia in chronic kidney disease: Secondary | ICD-10-CM

## 2021-08-17 ENCOUNTER — Telehealth: Payer: Self-pay | Admitting: Internal Medicine

## 2021-08-17 NOTE — Telephone Encounter (Signed)
Daughter called and states that pt fell and broke her hip. In hospital

## 2021-08-17 NOTE — Telephone Encounter (Signed)
Done

## 2021-08-18 DIAGNOSIS — E1122 Type 2 diabetes mellitus with diabetic chronic kidney disease: Secondary | ICD-10-CM | POA: Diagnosis not present

## 2021-08-18 DIAGNOSIS — N183 Chronic kidney disease, stage 3 unspecified: Secondary | ICD-10-CM | POA: Diagnosis not present

## 2021-08-18 DIAGNOSIS — S72114A Nondisplaced fracture of greater trochanter of right femur, initial encounter for closed fracture: Secondary | ICD-10-CM | POA: Diagnosis not present

## 2021-08-19 ENCOUNTER — Inpatient Hospital Stay: Payer: PPO

## 2021-08-25 DIAGNOSIS — E1142 Type 2 diabetes mellitus with diabetic polyneuropathy: Secondary | ICD-10-CM | POA: Diagnosis not present

## 2021-08-25 DIAGNOSIS — M9701XS Periprosthetic fracture around internal prosthetic right hip joint, sequela: Secondary | ICD-10-CM | POA: Diagnosis not present

## 2021-08-25 DIAGNOSIS — I10 Essential (primary) hypertension: Secondary | ICD-10-CM | POA: Diagnosis not present

## 2021-08-25 DIAGNOSIS — N1832 Chronic kidney disease, stage 3b: Secondary | ICD-10-CM | POA: Diagnosis not present

## 2021-08-25 DIAGNOSIS — K219 Gastro-esophageal reflux disease without esophagitis: Secondary | ICD-10-CM | POA: Diagnosis not present

## 2021-09-06 DIAGNOSIS — I10 Essential (primary) hypertension: Secondary | ICD-10-CM | POA: Diagnosis not present

## 2021-09-06 DIAGNOSIS — S72001D Fracture of unspecified part of neck of right femur, subsequent encounter for closed fracture with routine healing: Secondary | ICD-10-CM | POA: Diagnosis not present

## 2021-09-06 DIAGNOSIS — Z7982 Long term (current) use of aspirin: Secondary | ICD-10-CM | POA: Diagnosis not present

## 2021-09-06 DIAGNOSIS — E039 Hypothyroidism, unspecified: Secondary | ICD-10-CM | POA: Diagnosis not present

## 2021-09-06 DIAGNOSIS — E785 Hyperlipidemia, unspecified: Secondary | ICD-10-CM | POA: Diagnosis not present

## 2021-09-06 DIAGNOSIS — Z9181 History of falling: Secondary | ICD-10-CM | POA: Diagnosis not present

## 2021-09-06 DIAGNOSIS — M9701XD Periprosthetic fracture around internal prosthetic right hip joint, subsequent encounter: Secondary | ICD-10-CM | POA: Diagnosis not present

## 2021-09-06 DIAGNOSIS — J449 Chronic obstructive pulmonary disease, unspecified: Secondary | ICD-10-CM | POA: Diagnosis not present

## 2021-09-06 DIAGNOSIS — E119 Type 2 diabetes mellitus without complications: Secondary | ICD-10-CM | POA: Diagnosis not present

## 2021-09-08 DIAGNOSIS — N183 Chronic kidney disease, stage 3 unspecified: Secondary | ICD-10-CM | POA: Diagnosis not present

## 2021-09-08 DIAGNOSIS — E1122 Type 2 diabetes mellitus with diabetic chronic kidney disease: Secondary | ICD-10-CM | POA: Diagnosis not present

## 2021-09-08 DIAGNOSIS — M8000XD Age-related osteoporosis with current pathological fracture, unspecified site, subsequent encounter for fracture with routine healing: Secondary | ICD-10-CM | POA: Diagnosis not present

## 2021-09-08 DIAGNOSIS — J449 Chronic obstructive pulmonary disease, unspecified: Secondary | ICD-10-CM | POA: Diagnosis not present

## 2021-09-14 DIAGNOSIS — J449 Chronic obstructive pulmonary disease, unspecified: Secondary | ICD-10-CM | POA: Diagnosis not present

## 2021-09-14 DIAGNOSIS — S72001D Fracture of unspecified part of neck of right femur, subsequent encounter for closed fracture with routine healing: Secondary | ICD-10-CM | POA: Diagnosis not present

## 2021-09-14 DIAGNOSIS — I10 Essential (primary) hypertension: Secondary | ICD-10-CM | POA: Diagnosis not present

## 2021-09-14 DIAGNOSIS — Z9181 History of falling: Secondary | ICD-10-CM | POA: Diagnosis not present

## 2021-09-14 DIAGNOSIS — E785 Hyperlipidemia, unspecified: Secondary | ICD-10-CM | POA: Diagnosis not present

## 2021-09-14 DIAGNOSIS — Z7982 Long term (current) use of aspirin: Secondary | ICD-10-CM | POA: Diagnosis not present

## 2021-09-14 DIAGNOSIS — E119 Type 2 diabetes mellitus without complications: Secondary | ICD-10-CM | POA: Diagnosis not present

## 2021-09-14 DIAGNOSIS — M9701XD Periprosthetic fracture around internal prosthetic right hip joint, subsequent encounter: Secondary | ICD-10-CM | POA: Diagnosis not present

## 2021-09-14 DIAGNOSIS — E039 Hypothyroidism, unspecified: Secondary | ICD-10-CM | POA: Diagnosis not present

## 2021-09-16 ENCOUNTER — Inpatient Hospital Stay: Payer: PPO

## 2021-09-16 ENCOUNTER — Inpatient Hospital Stay: Payer: PPO | Attending: Internal Medicine

## 2021-09-16 ENCOUNTER — Other Ambulatory Visit: Payer: Self-pay

## 2021-09-16 VITALS — BP 160/68 | HR 76

## 2021-09-16 DIAGNOSIS — N1832 Chronic kidney disease, stage 3b: Secondary | ICD-10-CM | POA: Insufficient documentation

## 2021-09-16 DIAGNOSIS — D631 Anemia in chronic kidney disease: Secondary | ICD-10-CM | POA: Insufficient documentation

## 2021-09-16 LAB — CBC WITH DIFFERENTIAL/PLATELET
Abs Immature Granulocytes: 0.02 10*3/uL (ref 0.00–0.07)
Basophils Absolute: 0 10*3/uL (ref 0.0–0.1)
Basophils Relative: 0 %
Eosinophils Absolute: 0.1 10*3/uL (ref 0.0–0.5)
Eosinophils Relative: 1 %
HCT: 30.8 % — ABNORMAL LOW (ref 36.0–46.0)
Hemoglobin: 9.3 g/dL — ABNORMAL LOW (ref 12.0–15.0)
Immature Granulocytes: 0 %
Lymphocytes Relative: 25 %
Lymphs Abs: 1.6 10*3/uL (ref 0.7–4.0)
MCH: 31 pg (ref 26.0–34.0)
MCHC: 30.2 g/dL (ref 30.0–36.0)
MCV: 102.7 fL — ABNORMAL HIGH (ref 80.0–100.0)
Monocytes Absolute: 0.6 10*3/uL (ref 0.1–1.0)
Monocytes Relative: 9 %
Neutro Abs: 4.2 10*3/uL (ref 1.7–7.7)
Neutrophils Relative %: 65 %
Platelets: 264 10*3/uL (ref 150–400)
RBC: 3 MIL/uL — ABNORMAL LOW (ref 3.87–5.11)
RDW: 13.1 % (ref 11.5–15.5)
WBC: 6.5 10*3/uL (ref 4.0–10.5)
nRBC: 0 % (ref 0.0–0.2)

## 2021-09-16 MED ORDER — EPOETIN ALFA-EPBX 10000 UNIT/ML IJ SOLN
20000.0000 [IU] | Freq: Once | INTRAMUSCULAR | Status: AC
  Administered 2021-09-16: 20000 [IU] via SUBCUTANEOUS
  Filled 2021-09-16: qty 2

## 2021-09-20 DIAGNOSIS — E785 Hyperlipidemia, unspecified: Secondary | ICD-10-CM | POA: Diagnosis not present

## 2021-09-20 DIAGNOSIS — S72001D Fracture of unspecified part of neck of right femur, subsequent encounter for closed fracture with routine healing: Secondary | ICD-10-CM | POA: Diagnosis not present

## 2021-09-20 DIAGNOSIS — I10 Essential (primary) hypertension: Secondary | ICD-10-CM | POA: Diagnosis not present

## 2021-09-20 DIAGNOSIS — M9701XD Periprosthetic fracture around internal prosthetic right hip joint, subsequent encounter: Secondary | ICD-10-CM | POA: Diagnosis not present

## 2021-09-20 DIAGNOSIS — E039 Hypothyroidism, unspecified: Secondary | ICD-10-CM | POA: Diagnosis not present

## 2021-09-20 DIAGNOSIS — E119 Type 2 diabetes mellitus without complications: Secondary | ICD-10-CM | POA: Diagnosis not present

## 2021-09-20 DIAGNOSIS — J449 Chronic obstructive pulmonary disease, unspecified: Secondary | ICD-10-CM | POA: Diagnosis not present

## 2021-09-21 DIAGNOSIS — Z7982 Long term (current) use of aspirin: Secondary | ICD-10-CM | POA: Diagnosis not present

## 2021-09-21 DIAGNOSIS — E119 Type 2 diabetes mellitus without complications: Secondary | ICD-10-CM | POA: Diagnosis not present

## 2021-09-21 DIAGNOSIS — I10 Essential (primary) hypertension: Secondary | ICD-10-CM | POA: Diagnosis not present

## 2021-09-21 DIAGNOSIS — J449 Chronic obstructive pulmonary disease, unspecified: Secondary | ICD-10-CM | POA: Diagnosis not present

## 2021-09-21 DIAGNOSIS — Z9181 History of falling: Secondary | ICD-10-CM | POA: Diagnosis not present

## 2021-09-21 DIAGNOSIS — E039 Hypothyroidism, unspecified: Secondary | ICD-10-CM | POA: Diagnosis not present

## 2021-09-21 DIAGNOSIS — E785 Hyperlipidemia, unspecified: Secondary | ICD-10-CM | POA: Diagnosis not present

## 2021-09-21 DIAGNOSIS — S72001D Fracture of unspecified part of neck of right femur, subsequent encounter for closed fracture with routine healing: Secondary | ICD-10-CM | POA: Diagnosis not present

## 2021-09-21 DIAGNOSIS — M9701XD Periprosthetic fracture around internal prosthetic right hip joint, subsequent encounter: Secondary | ICD-10-CM | POA: Diagnosis not present

## 2021-09-29 DIAGNOSIS — S72001D Fracture of unspecified part of neck of right femur, subsequent encounter for closed fracture with routine healing: Secondary | ICD-10-CM | POA: Diagnosis not present

## 2021-09-29 DIAGNOSIS — I10 Essential (primary) hypertension: Secondary | ICD-10-CM | POA: Diagnosis not present

## 2021-09-29 DIAGNOSIS — M9701XD Periprosthetic fracture around internal prosthetic right hip joint, subsequent encounter: Secondary | ICD-10-CM | POA: Diagnosis not present

## 2021-09-29 DIAGNOSIS — E039 Hypothyroidism, unspecified: Secondary | ICD-10-CM | POA: Diagnosis not present

## 2021-09-29 DIAGNOSIS — E119 Type 2 diabetes mellitus without complications: Secondary | ICD-10-CM | POA: Diagnosis not present

## 2021-09-29 DIAGNOSIS — E785 Hyperlipidemia, unspecified: Secondary | ICD-10-CM | POA: Diagnosis not present

## 2021-09-29 DIAGNOSIS — Z9181 History of falling: Secondary | ICD-10-CM | POA: Diagnosis not present

## 2021-09-29 DIAGNOSIS — Z7982 Long term (current) use of aspirin: Secondary | ICD-10-CM | POA: Diagnosis not present

## 2021-09-29 DIAGNOSIS — J449 Chronic obstructive pulmonary disease, unspecified: Secondary | ICD-10-CM | POA: Diagnosis not present

## 2021-10-05 DIAGNOSIS — S72114A Nondisplaced fracture of greater trochanter of right femur, initial encounter for closed fracture: Secondary | ICD-10-CM | POA: Diagnosis not present

## 2021-10-21 ENCOUNTER — Inpatient Hospital Stay: Payer: PPO

## 2021-10-21 ENCOUNTER — Inpatient Hospital Stay: Payer: PPO | Attending: Internal Medicine

## 2021-10-21 DIAGNOSIS — D509 Iron deficiency anemia, unspecified: Secondary | ICD-10-CM | POA: Insufficient documentation

## 2021-10-21 DIAGNOSIS — N1832 Chronic kidney disease, stage 3b: Secondary | ICD-10-CM | POA: Diagnosis not present

## 2021-10-21 DIAGNOSIS — D631 Anemia in chronic kidney disease: Secondary | ICD-10-CM | POA: Diagnosis not present

## 2021-10-21 LAB — CBC WITH DIFFERENTIAL/PLATELET
Abs Immature Granulocytes: 0.02 10*3/uL (ref 0.00–0.07)
Basophils Absolute: 0 10*3/uL (ref 0.0–0.1)
Basophils Relative: 1 %
Eosinophils Absolute: 0.1 10*3/uL (ref 0.0–0.5)
Eosinophils Relative: 2 %
HCT: 33.9 % — ABNORMAL LOW (ref 36.0–46.0)
Hemoglobin: 10.1 g/dL — ABNORMAL LOW (ref 12.0–15.0)
Immature Granulocytes: 0 %
Lymphocytes Relative: 35 %
Lymphs Abs: 1.7 10*3/uL (ref 0.7–4.0)
MCH: 30.6 pg (ref 26.0–34.0)
MCHC: 29.8 g/dL — ABNORMAL LOW (ref 30.0–36.0)
MCV: 102.7 fL — ABNORMAL HIGH (ref 80.0–100.0)
Monocytes Absolute: 0.5 10*3/uL (ref 0.1–1.0)
Monocytes Relative: 11 %
Neutro Abs: 2.4 10*3/uL (ref 1.7–7.7)
Neutrophils Relative %: 51 %
Platelets: 224 10*3/uL (ref 150–400)
RBC: 3.3 MIL/uL — ABNORMAL LOW (ref 3.87–5.11)
RDW: 12.1 % (ref 11.5–15.5)
WBC: 4.8 10*3/uL (ref 4.0–10.5)
nRBC: 0 % (ref 0.0–0.2)

## 2021-11-18 ENCOUNTER — Encounter: Payer: Self-pay | Admitting: Internal Medicine

## 2021-11-18 ENCOUNTER — Inpatient Hospital Stay: Payer: PPO

## 2021-11-18 ENCOUNTER — Inpatient Hospital Stay (HOSPITAL_BASED_OUTPATIENT_CLINIC_OR_DEPARTMENT_OTHER): Payer: PPO | Admitting: Internal Medicine

## 2021-11-18 ENCOUNTER — Inpatient Hospital Stay: Payer: PPO | Attending: Internal Medicine

## 2021-11-18 DIAGNOSIS — N1832 Chronic kidney disease, stage 3b: Secondary | ICD-10-CM | POA: Diagnosis not present

## 2021-11-18 DIAGNOSIS — N1831 Chronic kidney disease, stage 3a: Secondary | ICD-10-CM | POA: Insufficient documentation

## 2021-11-18 DIAGNOSIS — D631 Anemia in chronic kidney disease: Secondary | ICD-10-CM

## 2021-11-18 LAB — BASIC METABOLIC PANEL
Anion gap: 6 (ref 5–15)
BUN: 31 mg/dL — ABNORMAL HIGH (ref 8–23)
CO2: 23 mmol/L (ref 22–32)
Calcium: 8.7 mg/dL — ABNORMAL LOW (ref 8.9–10.3)
Chloride: 107 mmol/L (ref 98–111)
Creatinine, Ser: 1.07 mg/dL — ABNORMAL HIGH (ref 0.44–1.00)
GFR, Estimated: 49 mL/min — ABNORMAL LOW (ref >60)
Glucose, Bld: 118 mg/dL — ABNORMAL HIGH (ref 70–99)
Potassium: 5 mmol/L (ref 3.5–5.1)
Sodium: 136 mmol/L (ref 135–145)

## 2021-11-18 LAB — CBC WITH DIFFERENTIAL/PLATELET
Abs Immature Granulocytes: 0.02 10*3/uL (ref 0.00–0.07)
Basophils Absolute: 0 10*3/uL (ref 0.0–0.1)
Basophils Relative: 0 %
Eosinophils Absolute: 0.1 10*3/uL (ref 0.0–0.5)
Eosinophils Relative: 2 %
HCT: 30.2 % — ABNORMAL LOW (ref 36.0–46.0)
Hemoglobin: 9.1 g/dL — ABNORMAL LOW (ref 12.0–15.0)
Immature Granulocytes: 0 %
Lymphocytes Relative: 33 %
Lymphs Abs: 1.7 10*3/uL (ref 0.7–4.0)
MCH: 30.6 pg (ref 26.0–34.0)
MCHC: 30.1 g/dL (ref 30.0–36.0)
MCV: 101.7 fL — ABNORMAL HIGH (ref 80.0–100.0)
Monocytes Absolute: 0.6 10*3/uL (ref 0.1–1.0)
Monocytes Relative: 11 %
Neutro Abs: 2.7 10*3/uL (ref 1.7–7.7)
Neutrophils Relative %: 54 %
Platelets: 262 10*3/uL (ref 150–400)
RBC: 2.97 MIL/uL — ABNORMAL LOW (ref 3.87–5.11)
RDW: 12.6 % (ref 11.5–15.5)
WBC: 5 10*3/uL (ref 4.0–10.5)
nRBC: 0 % (ref 0.0–0.2)

## 2021-11-18 LAB — IRON AND TIBC
Iron: 67 ug/dL (ref 28–170)
Saturation Ratios: 28 % (ref 10.4–31.8)
TIBC: 239 ug/dL — ABNORMAL LOW (ref 250–450)
UIBC: 172 ug/dL

## 2021-11-18 LAB — FERRITIN: Ferritin: 305 ng/mL (ref 11–307)

## 2021-11-18 MED ORDER — EPOETIN ALFA-EPBX 20000 UNIT/ML IJ SOLN
20000.0000 [IU] | Freq: Once | INTRAMUSCULAR | Status: AC
  Administered 2021-11-18: 20000 [IU] via SUBCUTANEOUS
  Filled 2021-11-18: qty 1

## 2021-11-18 NOTE — Progress Notes (Signed)
Patient denies new problems/concerns today.   ? ?Pain from recent right hip fracture in 08/2021 seems to be worsening.   ?

## 2021-11-18 NOTE — Patient Instructions (Signed)
#  Recommend gentle iron 1 pill a day; should not upset your stomach or cause constipation.  Talk to the pharmacist if you can find it/it is over-the-counter.  

## 2021-11-18 NOTE — Progress Notes (Signed)
Robyn Butler ?CONSULT NOTE ? ?Patient Care Team: ?Kirk Ruths, MD as PCP - General (Internal Medicine) ?Estill Cotta, MD as Consulting Physician (Ophthalmology) ?Cammie Sickle, MD as Consulting Physician (Hematology and Oncology) ? ?CHIEF COMPLAINTS/PURPOSE OF CONSULTATION:  ? ? ?HEMATOLOGY HISTORY ? ?#Anemia CKD-III [March 2021- Hb 8-9;[name];  Ferritin-100; PCP] [EGD/; colonoscopy-[facility];2016 ; capsule-]; iron saturation 20% ferritin-68; no hemolysis; on retacrit q monthly.  ? ?# CKD-III [GFR40s] ? ?# COPD  ? ?HISTORY OF PRESENTING ILLNESS: Ambulating with a rolling walker.  Accompanied by daughter ? ?Robyn Butler 88 y.o.  female anemia/chronic kidney disease is here for follow-up. ? ?In the interim patient was admitted to hospital status post hip fracture status-nonoperative management.  Patient discharged to rehab. ? ?Patient not taking iron pills. No nausea no vomiting.  No blood in stools or black-colored stools. ? ?Review of Systems  ?Constitutional:  Positive for malaise/fatigue. Negative for chills, diaphoresis, fever and weight loss.  ?HENT:  Negative for nosebleeds and sore throat.   ?Eyes:  Negative for double vision.  ?Respiratory:  Negative for hemoptysis, sputum production and wheezing.   ?Cardiovascular:  Negative for chest pain, palpitations, orthopnea and leg swelling.  ?Gastrointestinal:  Negative for abdominal pain, blood in stool, constipation, diarrhea, heartburn, melena, nausea and vomiting.  ?Genitourinary:  Negative for dysuria, frequency and urgency.  ?Musculoskeletal:  Positive for back pain and joint pain.  ?Skin: Negative.  Negative for itching and rash.  ?Neurological:  Negative for dizziness, tingling, focal weakness, weakness and headaches.  ?Endo/Heme/Allergies:  Does not bruise/bleed easily.  ?Psychiatric/Behavioral:  Negative for depression. The patient is not nervous/anxious and does not have insomnia.   ? ?MEDICAL HISTORY:  ?Past  Medical History:  ?Diagnosis Date  ? Arthritis   ? Cancer [facility])   ? Chronic kidney disease   ? COPD (chronic obstructive pulmonary disease) ([place])   ? Diabetes mellitus without complication ([place])   ? type 2  ? Hypertension   ? Varicose veins of lower extremities with other complications   ? ? ?SURGICAL HISTORY: ?Past Surgical History:  ?Procedure Laterality Date  ? ABDOMINAL HYSTERECTOMY  1972  ? BLADDER SUSPENSION    ? BREAST EXCISIONAL BIOPSY Right 1999  ? neg  ? CHOLECYSTECTOMY  1975  ? COLONOSCOPY    ? COLONOSCOPY WITH PROPOFOL N/A 05/24/2015  ? Procedure: COLONOSCOPY WITH PROPOFOL;  Surgeon: Hulen Luster, MD;  Location: [facility] ENDOSCOPY;  Service: Gastroenterology;  Laterality: N/A;  ? [place]  ? ESOPHAGOGASTRODUODENOSCOPY N/A 05/24/2015  ? Procedure: ESOPHAGOGASTRODUODENOSCOPY (EGD);  Surgeon: Hulen Luster, MD;  Location: [facility] ENDOSCOPY;  Service: Gastroenterology;  Laterality: N/A;  ? FRACTURE SURGERY    ? JOINT REPLACEMENT Right 2013  ? hip  ? salpingo oophorectmy     ? THYROIDECTOMY  1969  ? WRIST FRACTURE SURGERY Left   ? ? ?SOCIAL HISTORY: ?Social History  ? ?Socioeconomic History  ? Marital status: Widowed  ?  Spouse name: Not on file  ? Number of children: 2  ? Years of education: 9th Grade  ? Highest education level: 9th grade  ?Occupational History  ? Occupation: Retired  ?Tobacco Use  ? Smoking status: Former  ?  Types: Cigarettes  ?  Quit date: 07/18/1991  ?  Years since quitting: 30.3  ? Smokeless tobacco: Never  ?Vaping Use  ? Vaping Use: Never used  ?Substance and Sexual Activity  ? Alcohol use: No  ? Drug use: No  ? Sexual activity: Not on  file  ?Other Topics Concern  ? Not on file  ?Social History Narrative  ? In [place] in senior place; quit smoking [> 25 years ago]; no alcohol; hosiery.   ? ?Social Determinants of Health  ? ?Financial Resource Strain: Not on file  ?Food Insecurity: Not on file  ?Transportation Needs: Not on file  ?Physical Activity: Not on file  ?Stress: Not on file  ?Social  Connections: Not on file  ?Intimate Partner Violence: Not on file  ? ? ?FAMILY HISTORY: ?Family History  ?Problem Relation Age of Onset  ? Heart attack Father   ? Asthma Father   ? Kidney disease Mother   ? Hypertension Mother   ? Hypertension Other   ? Diabetes Other   ? Heart attack Other   ? Breast cancer Neg Hx   ? ? ?ALLERGIES:  is allergic to alendronate. ? ?MEDICATIONS:  ?Current Outpatient Medications  ?Medication Sig Dispense Refill  ? amLODipine (NORVASC) 5 MG tablet TAKE ONE(1) TABLET EACH DAY (Patient taking differently: Take 5 mg by mouth daily.) 90 tablet 4  ? aspirin EC 81 MG tablet Take 81 mg by mouth daily.     ? Cholecalciferol (VITAMIN D3) 50 MCG (2000 UT) TABS Take 1 tablet by mouth daily.    ? Cranberry 500 MG CAPS Take 500 mg by mouth daily.    ? cyanocobalamin 1000 MCG tablet Take 1,000 mcg by mouth daily.    ? dorzolamide (TRUSOPT) 2 % ophthalmic solution Place 1 drop into both eyes 2 (two) times daily.     ? feeding supplement (ENSURE ENLIVE / ENSURE PLUS) LIQD Take 237 mLs by mouth daily. 237 mL 12  ? gabapentin (NEURONTIN) 100 MG capsule Take 1 capsule by mouth 2 (two) times daily.    ? ibandronate (BONIVA) 150 MG tablet TAKE ONE TABLET ONCE A MONTH FIRST THING IN THE MORNING AT LEAST 1 HOUR BEFORE EATING, TAKE WITH WATER. 1 tablet 12  ? latanoprost (XALATAN) 0.005 % ophthalmic solution Place 1 drop into both eyes at bedtime.     ? levothyroxine (SYNTHROID) 100 MCG tablet TAKE 1 TABLET BY MOUTH EVERY DAY 90 tablet 3  ? loperamide (IMODIUM) 2 MG capsule Take 2 mg by mouth as needed for diarrhea or loose stools.    ? lovastatin (MEVACOR) 40 MG tablet TAKE 1 TABLET BY MOUTH AT BEDTIME 90 tablet 0  ? magnesium oxide (MAG-OX) 400 MG tablet Take 400 mg by mouth daily.    ? Multiple Vitamins-Minerals (PRESERVISION AREDS 2) CAPS Take 1 capsule by mouth 2 (two) times daily.    ? nortriptyline (PAMELOR) 25 MG capsule TAKE 1 CAPSULE(25 MG) BY MOUTH AT BEDTIME 90 capsule 0  ? omeprazole (PRILOSEC) 40  MG capsule Take 1 capsule (40 mg total) by mouth daily. 90 capsule 4  ? Polyethylene Glycol 3350 (DULCOLAX BALANCE PO) Take 2 tablets by mouth as needed.    ? SPIRIVA RESPIMAT 2.5 MCG/ACT AERS SMARTSIG:2 Puff(s) By Mouth Daily    ? valsartan-hydrochlorothiazide (DIOVAN-HCT) 160-25 MG tablet Take 1 tablet by mouth daily. 90 tablet 4  ? zinc sulfate 220 (50 Zn) MG capsule Take 220 mg by mouth daily.    ? ?No current facility-administered medications for this visit.  ? ?Facility-Administered Medications Ordered in Other Visits  ?Medication Dose Route Frequency Provider Last Rate Last Admin  ? epoetin alfa-epbx (RETACRIT) injection 20,000 Units  20,000 Units Subcutaneous Once Cammie Sickle, MD      ? ? ? ? ?PHYSICAL EXAMINATION: ? ? ?  Vitals:  ? 11/18/21 1300  ?BP: (!) 166/56  ?Pulse: 74  ?Resp: 16  ?Temp: (!) 96.2 ?F (35.7 ?C)  ? ?Filed Weights  ? 11/18/21 1300  ?Weight: 121 lb 9.6 oz (55.2 kg)  ? ? ?Physical Exam ?HENT:  ?   Head: Normocephalic and atraumatic.  ?   Mouth/Throat:  ?   Pharynx: No oropharyngeal exudate.  ?Eyes:  ?   Pupils: Pupils are equal, round, and reactive to light.  ?Cardiovascular:  ?   Rate and Rhythm: Normal rate and regular rhythm.  ?Pulmonary:  ?   Effort: No respiratory distress.  ?   Breath sounds: No wheezing.  ?Abdominal:  ?   General: Bowel sounds are normal. There is no distension.  ?   Palpations: Abdomen is soft. There is no mass.  ?   Tenderness: There is no abdominal tenderness. There is no guarding or rebound.  ?Musculoskeletal:     ?   General: No tenderness. Normal range of motion.  ?   Cervical back: Normal range of motion and neck supple.  ?Skin: ?   General: Skin is warm.  ?Neurological:  ?   Mental Status: She is alert and oriented to person, place, and time.  ?Psychiatric:     ?   Mood and Affect: Affect normal.  ? ? ?LABORATORY DATA:  ?I have reviewed the data as listed ?Lab Results  ?Component Value Date  ? WBC 5.0 11/18/2021  ? HGB 9.1 (L) 11/18/2021  ? HCT 30.2  (L) 11/18/2021  ? MCV 101.7 (H) 11/18/2021  ? PLT 262 11/18/2021  ? ?Recent Labs  ?  08/05/21 ?0607 08/06/21 ?8242 11/18/21 ?1246  ?NA 136 139 136  ?K 4.8 4.5 5.0  ?CL 107 104 107  ?CO2 '26 23 23  '$ ?GLUCOSE 13

## 2021-11-18 NOTE — Assessment & Plan Note (Addendum)
#  Anemia-secondary chronic kidney disease stage III/iron deficiency [March 2021-hb-8-9]. Iron sat- 20 &; ferritin-68. Currently on IV iron infusion/Retacrit.STABLE.  ? ?# Today hb-9.4 proceed with retacrit.; again reviwed the need for ongoing retacrit for the rest of her life. Will await on iron studies/proceed with infusion. Recommend PO iron pills- gentle iron.  ? ?DISPOSITION:  ?# proceed with Retacrit today;HOLD IV venofer today ?# monthly cbc/possible retacrit x4 ?#  Follow up in 4 months-- MD ;labs-cbc/bmp;iron-studies/ferritin possible SQ retacrit OR venofer- Dr.B ? ?

## 2021-11-18 NOTE — Addendum Note (Signed)
Addended by: Vanice Sarah on: 11/18/2021 01:49 PM ? ? Modules accepted: Orders ? ?

## 2021-11-22 DIAGNOSIS — E1122 Type 2 diabetes mellitus with diabetic chronic kidney disease: Secondary | ICD-10-CM | POA: Diagnosis not present

## 2021-11-22 DIAGNOSIS — E7800 Pure hypercholesterolemia, unspecified: Secondary | ICD-10-CM | POA: Diagnosis not present

## 2021-11-22 DIAGNOSIS — N183 Chronic kidney disease, stage 3 unspecified: Secondary | ICD-10-CM | POA: Diagnosis not present

## 2021-11-22 DIAGNOSIS — E039 Hypothyroidism, unspecified: Secondary | ICD-10-CM | POA: Diagnosis not present

## 2021-11-29 DIAGNOSIS — E1122 Type 2 diabetes mellitus with diabetic chronic kidney disease: Secondary | ICD-10-CM | POA: Diagnosis not present

## 2021-11-29 DIAGNOSIS — J449 Chronic obstructive pulmonary disease, unspecified: Secondary | ICD-10-CM | POA: Diagnosis not present

## 2021-11-29 DIAGNOSIS — I10 Essential (primary) hypertension: Secondary | ICD-10-CM | POA: Diagnosis not present

## 2021-11-29 DIAGNOSIS — E7800 Pure hypercholesterolemia, unspecified: Secondary | ICD-10-CM | POA: Diagnosis not present

## 2021-11-29 DIAGNOSIS — N183 Chronic kidney disease, stage 3 unspecified: Secondary | ICD-10-CM | POA: Diagnosis not present

## 2021-11-29 DIAGNOSIS — E039 Hypothyroidism, unspecified: Secondary | ICD-10-CM | POA: Diagnosis not present

## 2021-12-19 ENCOUNTER — Inpatient Hospital Stay: Payer: PPO | Attending: Internal Medicine

## 2021-12-19 ENCOUNTER — Inpatient Hospital Stay: Payer: PPO

## 2021-12-19 VITALS — BP 170/50 | HR 79

## 2021-12-19 DIAGNOSIS — D631 Anemia in chronic kidney disease: Secondary | ICD-10-CM | POA: Diagnosis not present

## 2021-12-19 DIAGNOSIS — N1832 Chronic kidney disease, stage 3b: Secondary | ICD-10-CM | POA: Insufficient documentation

## 2021-12-19 LAB — CBC WITH DIFFERENTIAL/PLATELET
Abs Immature Granulocytes: 0.06 10*3/uL (ref 0.00–0.07)
Basophils Absolute: 0 10*3/uL (ref 0.0–0.1)
Basophils Relative: 0 %
Eosinophils Absolute: 0.1 10*3/uL (ref 0.0–0.5)
Eosinophils Relative: 2 %
HCT: 32.5 % — ABNORMAL LOW (ref 36.0–46.0)
Hemoglobin: 9.8 g/dL — ABNORMAL LOW (ref 12.0–15.0)
Immature Granulocytes: 1 %
Lymphocytes Relative: 28 %
Lymphs Abs: 1.8 10*3/uL (ref 0.7–4.0)
MCH: 30.4 pg (ref 26.0–34.0)
MCHC: 30.2 g/dL (ref 30.0–36.0)
MCV: 100.9 fL — ABNORMAL HIGH (ref 80.0–100.0)
Monocytes Absolute: 0.5 10*3/uL (ref 0.1–1.0)
Monocytes Relative: 8 %
Neutro Abs: 4.1 10*3/uL (ref 1.7–7.7)
Neutrophils Relative %: 61 %
Platelets: 198 10*3/uL (ref 150–400)
RBC: 3.22 MIL/uL — ABNORMAL LOW (ref 3.87–5.11)
RDW: 13 % (ref 11.5–15.5)
WBC: 6.6 10*3/uL (ref 4.0–10.5)
nRBC: 0 % (ref 0.0–0.2)

## 2021-12-19 MED ORDER — EPOETIN ALFA-EPBX 20000 UNIT/ML IJ SOLN
20000.0000 [IU] | Freq: Once | INTRAMUSCULAR | Status: AC
  Administered 2021-12-19: 20000 [IU] via SUBCUTANEOUS
  Filled 2021-12-19: qty 1

## 2022-01-18 ENCOUNTER — Inpatient Hospital Stay: Payer: PPO | Attending: Internal Medicine

## 2022-01-18 ENCOUNTER — Inpatient Hospital Stay: Payer: PPO

## 2022-01-18 VITALS — BP 160/50 | HR 72

## 2022-01-18 DIAGNOSIS — D631 Anemia in chronic kidney disease: Secondary | ICD-10-CM | POA: Insufficient documentation

## 2022-01-18 DIAGNOSIS — N1832 Chronic kidney disease, stage 3b: Secondary | ICD-10-CM

## 2022-01-18 LAB — CBC WITH DIFFERENTIAL/PLATELET
Abs Immature Granulocytes: 0.04 10*3/uL (ref 0.00–0.07)
Basophils Absolute: 0 10*3/uL (ref 0.0–0.1)
Basophils Relative: 0 %
Eosinophils Absolute: 0.1 10*3/uL (ref 0.0–0.5)
Eosinophils Relative: 2 %
HCT: 32.8 % — ABNORMAL LOW (ref 36.0–46.0)
Hemoglobin: 9.9 g/dL — ABNORMAL LOW (ref 12.0–15.0)
Immature Granulocytes: 1 %
Lymphocytes Relative: 36 %
Lymphs Abs: 2.2 10*3/uL (ref 0.7–4.0)
MCH: 30.8 pg (ref 26.0–34.0)
MCHC: 30.2 g/dL (ref 30.0–36.0)
MCV: 102.2 fL — ABNORMAL HIGH (ref 80.0–100.0)
Monocytes Absolute: 0.6 10*3/uL (ref 0.1–1.0)
Monocytes Relative: 10 %
Neutro Abs: 3.1 10*3/uL (ref 1.7–7.7)
Neutrophils Relative %: 51 %
Platelets: 201 10*3/uL (ref 150–400)
RBC: 3.21 MIL/uL — ABNORMAL LOW (ref 3.87–5.11)
RDW: 12.3 % (ref 11.5–15.5)
WBC: 6.1 10*3/uL (ref 4.0–10.5)
nRBC: 0 % (ref 0.0–0.2)

## 2022-01-18 MED ORDER — EPOETIN ALFA-EPBX 10000 UNIT/ML IJ SOLN
20000.0000 [IU] | Freq: Once | INTRAMUSCULAR | Status: AC
  Administered 2022-01-18: 20000 [IU] via SUBCUTANEOUS
  Filled 2022-01-18: qty 2

## 2022-02-13 ENCOUNTER — Encounter: Payer: Self-pay | Admitting: Internal Medicine

## 2022-02-16 DIAGNOSIS — D509 Iron deficiency anemia, unspecified: Secondary | ICD-10-CM | POA: Diagnosis not present

## 2022-02-16 DIAGNOSIS — E1169 Type 2 diabetes mellitus with other specified complication: Secondary | ICD-10-CM | POA: Diagnosis not present

## 2022-02-16 DIAGNOSIS — N1832 Chronic kidney disease, stage 3b: Secondary | ICD-10-CM | POA: Diagnosis not present

## 2022-02-16 DIAGNOSIS — F3342 Major depressive disorder, recurrent, in full remission: Secondary | ICD-10-CM | POA: Diagnosis not present

## 2022-02-16 DIAGNOSIS — E1142 Type 2 diabetes mellitus with diabetic polyneuropathy: Secondary | ICD-10-CM | POA: Diagnosis not present

## 2022-02-16 DIAGNOSIS — J449 Chronic obstructive pulmonary disease, unspecified: Secondary | ICD-10-CM | POA: Diagnosis not present

## 2022-02-16 DIAGNOSIS — G63 Polyneuropathy in diseases classified elsewhere: Secondary | ICD-10-CM | POA: Diagnosis not present

## 2022-02-16 DIAGNOSIS — I509 Heart failure, unspecified: Secondary | ICD-10-CM | POA: Diagnosis not present

## 2022-02-16 DIAGNOSIS — E1139 Type 2 diabetes mellitus with other diabetic ophthalmic complication: Secondary | ICD-10-CM | POA: Diagnosis not present

## 2022-02-16 DIAGNOSIS — D631 Anemia in chronic kidney disease: Secondary | ICD-10-CM | POA: Diagnosis not present

## 2022-02-16 DIAGNOSIS — E038 Other specified hypothyroidism: Secondary | ICD-10-CM | POA: Diagnosis not present

## 2022-02-16 DIAGNOSIS — E1122 Type 2 diabetes mellitus with diabetic chronic kidney disease: Secondary | ICD-10-CM | POA: Diagnosis not present

## 2022-02-20 ENCOUNTER — Inpatient Hospital Stay: Payer: PPO | Attending: Internal Medicine

## 2022-02-20 ENCOUNTER — Inpatient Hospital Stay: Payer: PPO

## 2022-02-20 VITALS — BP 170/59 | HR 72

## 2022-02-20 DIAGNOSIS — N1832 Chronic kidney disease, stage 3b: Secondary | ICD-10-CM | POA: Insufficient documentation

## 2022-02-20 DIAGNOSIS — D631 Anemia in chronic kidney disease: Secondary | ICD-10-CM | POA: Insufficient documentation

## 2022-02-20 DIAGNOSIS — H401131 Primary open-angle glaucoma, bilateral, mild stage: Secondary | ICD-10-CM | POA: Diagnosis not present

## 2022-02-20 LAB — CBC WITH DIFFERENTIAL/PLATELET
Abs Immature Granulocytes: 0.04 10*3/uL (ref 0.00–0.07)
Basophils Absolute: 0 10*3/uL (ref 0.0–0.1)
Basophils Relative: 0 %
Eosinophils Absolute: 0.1 10*3/uL (ref 0.0–0.5)
Eosinophils Relative: 2 %
HCT: 32.1 % — ABNORMAL LOW (ref 36.0–46.0)
Hemoglobin: 9.9 g/dL — ABNORMAL LOW (ref 12.0–15.0)
Immature Granulocytes: 1 %
Lymphocytes Relative: 34 %
Lymphs Abs: 1.8 10*3/uL (ref 0.7–4.0)
MCH: 31.1 pg (ref 26.0–34.0)
MCHC: 30.8 g/dL (ref 30.0–36.0)
MCV: 100.9 fL — ABNORMAL HIGH (ref 80.0–100.0)
Monocytes Absolute: 0.5 10*3/uL (ref 0.1–1.0)
Monocytes Relative: 10 %
Neutro Abs: 2.9 10*3/uL (ref 1.7–7.7)
Neutrophils Relative %: 53 %
Platelets: 192 10*3/uL (ref 150–400)
RBC: 3.18 MIL/uL — ABNORMAL LOW (ref 3.87–5.11)
RDW: 12.5 % (ref 11.5–15.5)
WBC: 5.4 10*3/uL (ref 4.0–10.5)
nRBC: 0 % (ref 0.0–0.2)

## 2022-02-20 MED ORDER — EPOETIN ALFA-EPBX 20000 UNIT/ML IJ SOLN
20000.0000 [IU] | Freq: Once | INTRAMUSCULAR | Status: AC
  Administered 2022-02-20: 20000 [IU] via SUBCUTANEOUS
  Filled 2022-02-20: qty 1

## 2022-02-26 ENCOUNTER — Encounter: Payer: Self-pay | Admitting: Emergency Medicine

## 2022-02-26 ENCOUNTER — Inpatient Hospital Stay
Admission: EM | Admit: 2022-02-26 | Discharge: 2022-03-02 | DRG: 177 | Disposition: A | Discharge status: Home / Self Care | Payer: PPO | Attending: Internal Medicine | Admitting: Internal Medicine

## 2022-02-26 ENCOUNTER — Emergency Department: Payer: PPO

## 2022-02-26 ENCOUNTER — Other Ambulatory Visit: Payer: Self-pay

## 2022-02-26 DIAGNOSIS — J9621 Acute and chronic respiratory failure with hypoxia: Secondary | ICD-10-CM | POA: Diagnosis present

## 2022-02-26 DIAGNOSIS — I10 Essential (primary) hypertension: Secondary | ICD-10-CM | POA: Diagnosis present

## 2022-02-26 DIAGNOSIS — T17908A Unspecified foreign body in respiratory tract, part unspecified causing other injury, initial encounter: Secondary | ICD-10-CM | POA: Diagnosis not present

## 2022-02-26 DIAGNOSIS — T189XXA Foreign body of alimentary tract, part unspecified, initial encounter: Secondary | ICD-10-CM | POA: Diagnosis not present

## 2022-02-26 DIAGNOSIS — E039 Hypothyroidism, unspecified: Secondary | ICD-10-CM | POA: Diagnosis present

## 2022-02-26 DIAGNOSIS — Z96641 Presence of right artificial hip joint: Secondary | ICD-10-CM | POA: Diagnosis present

## 2022-02-26 DIAGNOSIS — R918 Other nonspecific abnormal finding of lung field: Secondary | ICD-10-CM | POA: Diagnosis not present

## 2022-02-26 DIAGNOSIS — Z87891 Personal history of nicotine dependence: Secondary | ICD-10-CM | POA: Diagnosis not present

## 2022-02-26 DIAGNOSIS — E785 Hyperlipidemia, unspecified: Secondary | ICD-10-CM | POA: Diagnosis present

## 2022-02-26 DIAGNOSIS — K219 Gastro-esophageal reflux disease without esophagitis: Secondary | ICD-10-CM | POA: Diagnosis present

## 2022-02-26 DIAGNOSIS — J9601 Acute respiratory failure with hypoxia: Secondary | ICD-10-CM | POA: Diagnosis present

## 2022-02-26 DIAGNOSIS — Z825 Family history of asthma and other chronic lower respiratory diseases: Secondary | ICD-10-CM | POA: Diagnosis not present

## 2022-02-26 DIAGNOSIS — E1169 Type 2 diabetes mellitus with other specified complication: Secondary | ICD-10-CM | POA: Diagnosis present

## 2022-02-26 DIAGNOSIS — J690 Pneumonitis due to inhalation of food and vomit: Secondary | ICD-10-CM | POA: Diagnosis present

## 2022-02-26 DIAGNOSIS — M1990 Unspecified osteoarthritis, unspecified site: Secondary | ICD-10-CM | POA: Diagnosis present

## 2022-02-26 DIAGNOSIS — D509 Iron deficiency anemia, unspecified: Secondary | ICD-10-CM | POA: Diagnosis present

## 2022-02-26 DIAGNOSIS — E119 Type 2 diabetes mellitus without complications: Secondary | ICD-10-CM | POA: Diagnosis not present

## 2022-02-26 DIAGNOSIS — S1095XA Superficial foreign body of unspecified part of neck, initial encounter: Secondary | ICD-10-CM | POA: Diagnosis not present

## 2022-02-26 DIAGNOSIS — N1832 Chronic kidney disease, stage 3b: Secondary | ICD-10-CM | POA: Diagnosis present

## 2022-02-26 DIAGNOSIS — J449 Chronic obstructive pulmonary disease, unspecified: Secondary | ICD-10-CM | POA: Diagnosis present

## 2022-02-26 DIAGNOSIS — E1149 Type 2 diabetes mellitus with other diabetic neurological complication: Secondary | ICD-10-CM | POA: Diagnosis not present

## 2022-02-26 DIAGNOSIS — Z888 Allergy status to other drugs, medicaments and biological substances status: Secondary | ICD-10-CM | POA: Diagnosis not present

## 2022-02-26 DIAGNOSIS — Z8249 Family history of ischemic heart disease and other diseases of the circulatory system: Secondary | ICD-10-CM | POA: Diagnosis not present

## 2022-02-26 DIAGNOSIS — H9190 Unspecified hearing loss, unspecified ear: Secondary | ICD-10-CM | POA: Diagnosis present

## 2022-02-26 DIAGNOSIS — E890 Postprocedural hypothyroidism: Secondary | ICD-10-CM | POA: Diagnosis present

## 2022-02-26 DIAGNOSIS — T17220A Food in pharynx causing asphyxiation, initial encounter: Secondary | ICD-10-CM | POA: Diagnosis not present

## 2022-02-26 DIAGNOSIS — E1140 Type 2 diabetes mellitus with diabetic neuropathy, unspecified: Secondary | ICD-10-CM | POA: Diagnosis present

## 2022-02-26 DIAGNOSIS — D631 Anemia in chronic kidney disease: Secondary | ICD-10-CM | POA: Diagnosis present

## 2022-02-26 DIAGNOSIS — Z841 Family history of disorders of kidney and ureter: Secondary | ICD-10-CM | POA: Diagnosis not present

## 2022-02-26 DIAGNOSIS — E1122 Type 2 diabetes mellitus with diabetic chronic kidney disease: Secondary | ICD-10-CM | POA: Diagnosis present

## 2022-02-26 DIAGNOSIS — I129 Hypertensive chronic kidney disease with stage 1 through stage 4 chronic kidney disease, or unspecified chronic kidney disease: Secondary | ICD-10-CM | POA: Diagnosis present

## 2022-02-26 DIAGNOSIS — T17908D Unspecified foreign body in respiratory tract, part unspecified causing other injury, subsequent encounter: Secondary | ICD-10-CM | POA: Diagnosis not present

## 2022-02-26 DIAGNOSIS — T17228A Food in pharynx causing other injury, initial encounter: Secondary | ICD-10-CM | POA: Diagnosis present

## 2022-02-26 DIAGNOSIS — N1831 Chronic kidney disease, stage 3a: Secondary | ICD-10-CM | POA: Diagnosis present

## 2022-02-26 LAB — CBC WITH DIFFERENTIAL/PLATELET
Abs Immature Granulocytes: 0.08 10*3/uL — ABNORMAL HIGH (ref 0.00–0.07)
Basophils Absolute: 0 10*3/uL (ref 0.0–0.1)
Basophils Relative: 0 %
Eosinophils Absolute: 0.1 10*3/uL (ref 0.0–0.5)
Eosinophils Relative: 1 %
HCT: 37.9 % (ref 36.0–46.0)
Hemoglobin: 11.3 g/dL — ABNORMAL LOW (ref 12.0–15.0)
Immature Granulocytes: 1 %
Lymphocytes Relative: 34 %
Lymphs Abs: 2.6 10*3/uL (ref 0.7–4.0)
MCH: 30 pg (ref 26.0–34.0)
MCHC: 29.8 g/dL — ABNORMAL LOW (ref 30.0–36.0)
MCV: 100.5 fL — ABNORMAL HIGH (ref 80.0–100.0)
Monocytes Absolute: 0.6 10*3/uL (ref 0.1–1.0)
Monocytes Relative: 7 %
Neutro Abs: 4.3 10*3/uL (ref 1.7–7.7)
Neutrophils Relative %: 57 %
Platelets: 246 10*3/uL (ref 150–400)
RBC: 3.77 MIL/uL — ABNORMAL LOW (ref 3.87–5.11)
RDW: 13.5 % (ref 11.5–15.5)
WBC: 7.7 10*3/uL (ref 4.0–10.5)
nRBC: 0.3 % — ABNORMAL HIGH (ref 0.0–0.2)

## 2022-02-26 LAB — BASIC METABOLIC PANEL
Anion gap: 9 (ref 5–15)
Anion gap: 8 (ref 5–15)
BUN: 25 mg/dL — ABNORMAL HIGH (ref 8–23)
BUN: 22 mg/dL (ref 8–23)
CO2: 25 mmol/L (ref 22–32)
CO2: 23 mmol/L (ref 22–32)
Calcium: 9.3 mg/dL (ref 8.9–10.3)
Calcium: 8.9 mg/dL (ref 8.9–10.3)
Chloride: 106 mmol/L (ref 98–111)
Chloride: 107 mmol/L (ref 98–111)
Creatinine, Ser: 1.1 mg/dL — ABNORMAL HIGH (ref 0.44–1.00)
Creatinine, Ser: 1.19 mg/dL — ABNORMAL HIGH (ref 0.44–1.00)
GFR, Estimated: 48 mL/min — ABNORMAL LOW (ref >60)
GFR, Estimated: 43 mL/min — ABNORMAL LOW (ref >60)
Glucose, Bld: 205 mg/dL — ABNORMAL HIGH (ref 70–99)
Glucose, Bld: 133 mg/dL — ABNORMAL HIGH (ref 70–99)
Potassium: 4 mmol/L (ref 3.5–5.1)
Potassium: 5.2 mmol/L — ABNORMAL HIGH (ref 3.5–5.1)
Sodium: 140 mmol/L (ref 135–145)
Sodium: 138 mmol/L (ref 135–145)

## 2022-02-26 LAB — CBC
HCT: 38.6 % (ref 36.0–46.0)
Hemoglobin: 11.4 g/dL — ABNORMAL LOW (ref 12.0–15.0)
MCH: 29.9 pg (ref 26.0–34.0)
MCHC: 29.5 g/dL — ABNORMAL LOW (ref 30.0–36.0)
MCV: 101.3 fL — ABNORMAL HIGH (ref 80.0–100.0)
Platelets: 268 10*3/uL (ref 150–400)
RBC: 3.81 MIL/uL — ABNORMAL LOW (ref 3.87–5.11)
RDW: 13.5 % (ref 11.5–15.5)
WBC: 23.5 10*3/uL — ABNORMAL HIGH (ref 4.0–10.5)
nRBC: 0.1 % (ref 0.0–0.2)

## 2022-02-26 LAB — HEMOGLOBIN A1C
Hgb A1c MFr Bld: 5.4 % (ref 4.8–5.6)
Mean Plasma Glucose: 108.28 mg/dL

## 2022-02-26 LAB — CBG MONITORING, ED
Glucose-Capillary: 169 mg/dL — ABNORMAL HIGH (ref 70–99)
Glucose-Capillary: 135 mg/dL — ABNORMAL HIGH (ref 70–99)

## 2022-02-26 MED ORDER — HYDROCHLOROTHIAZIDE 25 MG PO TABS
25.0000 mg | ORAL_TABLET | Freq: Every day | ORAL | Status: DC
  Administered 2022-02-27: 25 mg via ORAL
  Filled 2022-02-26 (×2): qty 1

## 2022-02-26 MED ORDER — VITAMIN B-12 1000 MCG PO TABS
1000.0000 ug | ORAL_TABLET | Freq: Every day | ORAL | Status: DC
  Administered 2022-02-27 – 2022-03-02 (×4): 1000 ug via ORAL
  Filled 2022-02-26 (×4): qty 1

## 2022-02-26 MED ORDER — AMPICILLIN-SULBACTAM SODIUM 3 (2-1) G IJ SOLR
3.0000 g | Freq: Two times a day (BID) | INTRAMUSCULAR | Status: DC
  Administered 2022-02-27 – 2022-03-01 (×5): 3 g via INTRAVENOUS
  Filled 2022-02-26: qty 8
  Filled 2022-02-26 (×3): qty 3
  Filled 2022-02-26 (×2): qty 8

## 2022-02-26 MED ORDER — PRAVASTATIN SODIUM 20 MG PO TABS
40.0000 mg | ORAL_TABLET | Freq: Every day | ORAL | Status: DC
  Administered 2022-02-26 – 2022-03-01 (×4): 40 mg via ORAL
  Filled 2022-02-26 (×4): qty 2

## 2022-02-26 MED ORDER — NORTRIPTYLINE HCL 25 MG PO CAPS
25.0000 mg | ORAL_CAPSULE | Freq: Every day | ORAL | Status: DC
  Administered 2022-02-26 – 2022-03-01 (×4): 25 mg via ORAL
  Filled 2022-02-26 (×5): qty 1

## 2022-02-26 MED ORDER — HEPARIN SODIUM (PORCINE) 5000 UNIT/ML IJ SOLN
5000.0000 [IU] | Freq: Three times a day (TID) | INTRAMUSCULAR | Status: AC
  Administered 2022-02-26: 5000 [IU] via SUBCUTANEOUS
  Filled 2022-02-26: qty 1

## 2022-02-26 MED ORDER — ACETAMINOPHEN 650 MG RE SUPP: 650.0000 mg | Freq: Four times a day (QID) | RECTAL | Status: DC | PRN

## 2022-02-26 MED ORDER — ONDANSETRON HCL 4 MG/2ML IJ SOLN: 4.0000 mg | Freq: Four times a day (QID) | INTRAMUSCULAR | Status: DC | PRN

## 2022-02-26 MED ORDER — INSULIN ASPART 100 UNIT/ML IJ SOLN
0.0000 [IU] | Freq: Three times a day (TID) | INTRAMUSCULAR | Status: DC
  Administered 2022-02-26: 2 [IU] via SUBCUTANEOUS
  Administered 2022-02-28 – 2022-03-01 (×2): 1 [IU] via SUBCUTANEOUS
  Administered 2022-03-01: 2 [IU] via SUBCUTANEOUS
  Filled 2022-02-26 (×4): qty 1

## 2022-02-26 MED ORDER — AMLODIPINE BESYLATE 5 MG PO TABS
5.0000 mg | ORAL_TABLET | Freq: Every day | ORAL | Status: DC
  Administered 2022-02-27 – 2022-03-02 (×4): 5 mg via ORAL
  Filled 2022-02-26 (×4): qty 1

## 2022-02-26 MED ORDER — LEVOTHYROXINE SODIUM 100 MCG PO TABS
100.0000 ug | ORAL_TABLET | Freq: Every day | ORAL | Status: DC
  Administered 2022-02-27 – 2022-03-02 (×4): 100 ug via ORAL
  Filled 2022-02-26: qty 1
  Filled 2022-02-26: qty 2
  Filled 2022-02-26 (×2): qty 1

## 2022-02-26 MED ORDER — SENNOSIDES-DOCUSATE SODIUM 8.6-50 MG PO TABS: 1.0000 | ORAL_TABLET | Freq: Every evening | ORAL | Status: DC | PRN

## 2022-02-26 MED ORDER — ACETAMINOPHEN 325 MG PO TABS: 650.0000 mg | ORAL_TABLET | Freq: Four times a day (QID) | ORAL | Status: DC | PRN

## 2022-02-26 MED ORDER — LOPERAMIDE HCL 2 MG PO CAPS: 2.0000 mg | ORAL_CAPSULE | ORAL | Status: DC | PRN

## 2022-02-26 MED ORDER — PANTOPRAZOLE SODIUM 40 MG PO TBEC
80.0000 mg | DELAYED_RELEASE_TABLET | Freq: Every day | ORAL | Status: DC
  Administered 2022-02-27 – 2022-03-02 (×4): 80 mg via ORAL
  Filled 2022-02-26 (×4): qty 2

## 2022-02-26 MED ORDER — GABAPENTIN 100 MG PO CAPS
100.0000 mg | ORAL_CAPSULE | Freq: Two times a day (BID) | ORAL | Status: DC
  Administered 2022-02-26 – 2022-03-02 (×8): 100 mg via ORAL
  Filled 2022-02-26 (×8): qty 1

## 2022-02-26 MED ORDER — ONDANSETRON HCL 4 MG PO TABS: 4.0000 mg | ORAL_TABLET | Freq: Four times a day (QID) | ORAL | Status: DC | PRN

## 2022-02-26 MED ORDER — HYDRALAZINE HCL 20 MG/ML IJ SOLN: 5.0000 mg | Freq: Four times a day (QID) | INTRAMUSCULAR | Status: DC | PRN

## 2022-02-26 MED ORDER — SODIUM CHLORIDE 0.9 % IV SOLN
3.0000 g | Freq: Once | INTRAVENOUS | Status: AC
  Administered 2022-02-26: 3 g via INTRAVENOUS
  Filled 2022-02-26: qty 8

## 2022-02-26 MED ORDER — INSULIN ASPART 100 UNIT/ML IJ SOLN: 0.0000 [IU] | Freq: Every day | INTRAMUSCULAR | Status: DC

## 2022-02-26 MED ORDER — OCUVITE-LUTEIN PO CAPS
1.0000 | ORAL_CAPSULE | Freq: Two times a day (BID) | ORAL | Status: DC
Start: 2022-02-27 — End: 2022-03-02
  Administered 2022-02-27 – 2022-03-02 (×7): 1 via ORAL
  Filled 2022-02-26 (×7): qty 1

## 2022-02-26 MED ORDER — VALSARTAN-HYDROCHLOROTHIAZIDE 160-25 MG PO TABS: 1.0000 | ORAL_TABLET | Freq: Every day | ORAL | Status: DC

## 2022-02-26 MED ORDER — VITAMIN D3 25 MCG (1000 UNIT) PO TABS
2000.0000 [IU] | ORAL_TABLET | Freq: Every day | ORAL | Status: DC
  Administered 2022-02-27 – 2022-03-02 (×4): 2000 [IU] via ORAL
  Filled 2022-02-26 (×8): qty 2

## 2022-02-26 MED ORDER — IRBESARTAN 150 MG PO TABS
150.0000 mg | ORAL_TABLET | Freq: Every day | ORAL | Status: DC
  Administered 2022-02-27: 150 mg via ORAL
  Filled 2022-02-26: qty 1

## 2022-02-26 NOTE — ED Notes (Signed)
Initial O2 59% on RA, placed pt on a NRB 15L at this time 93% on NRB

## 2022-02-26 NOTE — Assessment & Plan Note (Addendum)
last creatinine 1.28 with a GFR 40

## 2022-02-26 NOTE — Progress Notes (Signed)
Pharmacy Antibiotic Note  Robyn Butler is a 88 y.o. female admitted on 02/26/2022 with aspiration pneumonia.  Pharmacy has been consulted for Unasyn dosing.  -patient apparently aspirated some shrimp shell- see ENT note  Plan: Will order Unasyn 3 gm IV q12h  for Crcl 26.9 ml/min    Height: '5\' 2"'$  (157.5 cm) Weight: 55 kg (121 lb 4.1 oz) IBW/kg (Calculated) : 50.1  Temp (24hrs), Avg:98.5 F (36.9 C), Min:98.5 F (36.9 C), Max:98.5 F (36.9 C)  Recent Labs  Lab 02/20/22 1314 02/26/22 1409  WBC 5.4 7.7  CREATININE  --  1.10*    Estimated Creatinine Clearance: 26.9 mL/min (A) (by C-G formula based on SCr of 1.1 mg/dL (H)).    Allergies  Allergen Reactions   Alendronate Nausea And Vomiting    Other reaction(s): Vomiting    Antimicrobials this admission: Unasyn  8/13 >>       Dose adjustments this admission:    Microbiology results:   BCx:     UCx:      Sputum:      MRSA PCR:    Thank you for allowing pharmacy to be a part of this patient's care.  Leodan Bolyard A 02/26/2022 5:06 PM

## 2022-02-26 NOTE — Assessment & Plan Note (Deleted)
Continue Pravastatin 40 mg daily

## 2022-02-26 NOTE — ED Provider Notes (Signed)
[facility] Provider Note    Event Date/Time   First MD Initiated Contact with Patient 02/26/22 1354     (approximate)   History   Chief Complaint Foreign Body   HPI  Robyn Butler is a 88 y.o. female with past medical history of hypertension, diabetes, COPD, CKD, and anemia who presents to the ED complaining of foreign body.  Patient reports that just prior to arrival she was eating shrimp at the [employer] when she felt like some got stuck in her throat.  She immediately had trouble breathing with sensation of something stuck around the middle of her neck.  She denies any vomiting or feeling like she is having trouble swallowing.  She has been able to swallow her oral secretions since onset of symptoms.  She had been feeling well prior to this event.      Physical Exam   Triage Vital Signs: ED Triage Vitals  Enc Vitals Group     BP 02/26/22 1358 (!) 174/86     Pulse Rate 02/26/22 1358 94     Resp 02/26/22 1358 (!) 26     Temp 02/26/22 1358 98.5 F (36.9 C)     Temp Source 02/26/22 1358 Oral     SpO2 02/26/22 1358 (!) 59 %     Weight 02/26/22 1351 121 lb 4.1 oz (55 kg)     Height 02/26/22 1351 '5\' 2"'$  (1.575 m)     Head Circumference --      Peak Flow --      Pain Score 02/26/22 1351 8     Pain Loc --      Pain Edu? --      Excl. in [place]? --     Most recent vital signs: Vitals:   02/26/22 1358  BP: (!) 174/86  Pulse: 94  Resp: (!) 26  Temp: 98.5 F (36.9 C)  SpO2: (!) 59%    Constitutional: Alert and oriented. Eyes: Conjunctivae are normal. Head: Atraumatic. Nose: No congestion/rhinnorhea. Mouth/Throat: Mucous membranes are moist.  Posterior oropharynx clear.  No stridor noted. Cardiovascular: Normal rate, regular rhythm. Grossly normal heart sounds.  2+ radial pulses bilaterally. Respiratory: Mildly tachypneic with increased respiratory effort.  No retractions. Lungs CTAB. Gastrointestinal: Soft and nontender. No  distention. Musculoskeletal: No lower extremity tenderness nor edema.  Neurologic:  Normal speech and language. No gross focal neurologic deficits are appreciated.    ED Results / Procedures / Treatments   Labs (all labs ordered are listed, but only abnormal results are displayed) Labs Reviewed  CBC WITH DIFFERENTIAL/PLATELET - Abnormal; Notable for the following components:      Result Value   RBC 3.77 (*)    Hemoglobin 11.3 (*)    MCV 100.5 (*)    MCHC 29.8 (*)    nRBC 0.3 (*)    Abs Immature Granulocytes 0.08 (*)    All other components within normal limits  BASIC METABOLIC PANEL - Abnormal; Notable for the following components:   Glucose, Bld 205 (*)    BUN 25 (*)    Creatinine, Ser 1.10 (*)    GFR, Estimated 48 (*)    All other components within normal limits    RADIOLOGY Chest x-ray reviewed and interpreted by me with no infiltrate, edema, or effusion, no foreign body noted.  Neck x-ray reviewed and interpreted by me with no foreign body.  PROCEDURES:  Critical Care performed: No  .Critical Care  Performed by: Blake Divine, MD Authorized by:  Blake Divine, MD   Critical care provider statement:    Critical care time (minutes):  30   Critical care time was exclusive of:  Separately billable procedures and treating other patients and teaching time   Critical care was necessary to treat or prevent imminent or life-threatening deterioration of the following conditions:  Respiratory failure   Critical care was time spent personally by me on the following activities:  Development of treatment plan with patient or surrogate, discussions with consultants, evaluation of patient's response to treatment, examination of patient, ordering and review of laboratory studies, ordering and review of radiographic studies, ordering and performing treatments and interventions, pulse oximetry, re-evaluation of patient's condition and review of old charts   I assumed direction of  critical care for this patient from another provider in my specialty: no      MEDICATIONS ORDERED IN ED: Medications - No data to display   IMPRESSION / MDM / [place] / ED COURSE  I reviewed the triage vital signs and the nursing notes.                              88 y.o. female with past medical history of hypertension, diabetes, COPD, CKD, and anemia who presents to the ED with sensation of a piece of shrimp stuck in her throat with some difficulty breathing.  Patient's presentation is most consistent with acute presentation with potential threat to life or bodily function.  Differential diagnosis includes, but is not limited to, aspiration, esophageal foreign body, laryngeal foreign body.  Patient arrives to the ED tachypneic with difficulty breathing, placed on pulse oximeter and noted to have oxygen saturations of 59% with clear pleth.  She was placed on a nonrebreather with improvement to 93%, no stridor noted at this time and patient now stating that she feels foreign body may have shifted downwards.  Foreign body likely located in her larynx or lower airways given hypoxic respiratory failure, no findings to suggest esophageal foreign body.  Case discussed with Dr. Kathyrn Sheriff of ENT, who performed laryngoscopy and found small piece of shrimp shell in the posterior pharyngeal wall.  This was able to be removed and patient swallowed the particle.  This does not seem to fully explain her of hypoxic respiratory failure and case discussed with Dr. Genia Harold of pulmonary who recommends proceeding with noncontrast CT scan of the chest to determine if large foreign body present.  CT scan of chest does not show any large foreign body, but does show evidence of aspiration.  She was able to be weaned to 3 L nasal cannula and states her breathing is feeling better.  We will start patient on Unasyn for aspiration and plan for admission to the hospitalist service.      FINAL CLINICAL  IMPRESSION(S) / ED DIAGNOSES   Final diagnoses:  Aspiration pneumonia, unspecified aspiration pneumonia type, unspecified laterality, unspecified part of lung ([place])  Acute respiratory failure with hypoxia ([place])     Rx / DC Orders   ED Discharge Orders     None        Note:  This document was prepared using Dragon voice recognition software and may include unintentional dictation errors.   Blake Divine, MD 02/26/22 [phone]

## 2022-02-26 NOTE — Consult Note (Signed)
Robyn Butler, [place] [id number] [birth year] Robyn Divine, MD  Reason for Consult: To evaluate for possible foreign body of the larynx  HPI: The patient is a 88 year old white female who was eating a shrimp meal at a local seafood place she felt like some of the shrimp shell went down her throat and got stuck.  Her voice was hoarse at first and then seemed to get better but she was short of breath as well.  She was brought to the emergency room and found to have an O2 sat of 59%.  She was placed on a nonrebreather and her O2 sats have come up.  She coughs a little bit of mucus in her lungs periodically.  Her voice sounds clear now.  Assessment is made of her larynx to see if there is any foreign body in the larynx.  Allergies:  Allergies  Allergen Reactions   Alendronate Nausea And Vomiting    Other reaction(s): Vomiting    ROS: Review of systems normal other than 12 systems except per HPI.  PMH:  Past Medical History:  Diagnosis Date   Arthritis    Cancer ([place])    Chronic kidney disease    COPD (chronic obstructive pulmonary disease) ([place])    Diabetes mellitus without complication ([place])    type 2   Hypertension    Varicose veins of lower extremities with other complications     FH:  Family History  Problem Relation Age of Onset   Heart attack Father    Asthma Father    Kidney disease Mother    Hypertension Mother    Hypertension Other    Diabetes Other    Heart attack Other    Breast cancer Neg Hx     SH:  Social History   Socioeconomic History   Marital status: Widowed    Spouse name: Not on file   Number of children: 2   Years of education: 9th Grade   Highest education level: 9th grade  Occupational History   Occupation: Retired  Tobacco Use   Smoking status: Former    Types: Cigarettes    Quit date: 07/18/1991    Years since quitting: 30.6   Smokeless tobacco: Never  Vaping Use   Vaping Use: Never used  Substance and Sexual Activity   Alcohol use: No   Drug  use: No   Sexual activity: Not on file  Other Topics Concern   Not on file  Social History Narrative   In [place] in senior place; quit smoking [> 25 years ago]; no alcohol; hosiery.    Social Determinants of Health   Financial Resource Strain: Low Risk  (12/21/2017)   Overall Financial Resource Strain (CARDIA)    Difficulty of Paying Living Expenses: Not hard at all  Food Insecurity: No Food Insecurity (12/21/2017)   Hunger Vital Sign    Worried About Running Out of Food in the Last Year: Never true    Ran Out of Food in the Last Year: Never true  Transportation Needs: No Transportation Needs (12/21/2017)   PRAPARE - Hydrologist (Medical): No    Lack of Transportation (Non-Medical): No  Physical Activity: Inactive (12/24/2018)   Exercise Vital Sign    Days of Exercise per Week: 0 days    Minutes of Exercise per Session: 0 min  Stress: No Stress Concern Present (12/21/2017)   [address]    Feeling of Stress : Not at all  Social Connections: Unknown (12/24/2018)   Social Connection and Isolation Panel [NHANES]    Frequency of Communication with Friends and Family: Patient refused    Frequency of Social Gatherings with Friends and Family: Patient refused    Attends Religious Services: Patient refused    Active Member of Clubs or Organizations: Patient refused    Attends Archivist Meetings: Patient refused    Marital Status: Patient refused  Intimate Partner Violence: Unknown (12/24/2018)   Humiliation, Afraid, Rape, and Kick questionnaire    Fear of Current or Ex-Partner: Patient refused    Emotionally Abused: Patient refused    Physically Abused: Patient refused    Sexually Abused: Patient refused    PSH:  Past Surgical History:  Procedure Laterality Date   ABDOMINAL HYSTERECTOMY  1972   BLADDER SUSPENSION     BREAST EXCISIONAL BIOPSY Right 1999   neg   [place] N/A 05/24/2015   Procedure: COLONOSCOPY WITH PROPOFOL;  Surgeon: Hulen Luster, MD;  Location: [facility] ENDOSCOPY;  Service: Gastroenterology;  Laterality: N/A;   CYSTOSCOPY  1972   ESOPHAGOGASTRODUODENOSCOPY N/A 05/24/2015   Procedure: ESOPHAGOGASTRODUODENOSCOPY (EGD);  Surgeon: Hulen Luster, MD;  Location: [facility] ENDOSCOPY;  Service: Gastroenterology;  Laterality: N/A;   FRACTURE SURGERY     JOINT REPLACEMENT Right 2013   hip   salpingo oophorectmy      THYROIDECTOMY  1969   WRIST FRACTURE SURGERY Left     Physical  Exam: The patient is awake and alert very cooperative.  She is somewhat hard of hearing and I need to speak up for her to understand it.  Her oropharynx is clear and she has no signs of any irritation here.  She is using a nonrebreather mask and her O2 sats are 100%.  Flexible laryngoscopy is done and this is dictated in detail elsewhere.  The flexible scope was passed through the left nostril to visualize the hypopharynx and larynx.  There were no problems noted at the larynx.  There is a small piece of a shrimp shell that is stuck in the left posterior pharyngeal wall approximately 2 cm above the arytenoids.  No other foreign bodies are noted.  Using the scope unable to dislodge this and push it down and posterior arytenoid area where she is able to swallow this away.   A/P: The patient apparently aspirated some shrimp shell.  Some of that may have been swallowed as well and cause some discomfort at her upper esophageal sphincter.  Her O2 sats were low and she either aspirated part of the shell into her lungs or potentially she aspirated some saliva while she was trying to clear the shell out.  I think it is appropriate to consider a CT scan of her lungs to make sure there is no consolidation or signs of a foreign body.  If this is perfectly clear and her O2 sats improve then it may be minor aspiration that occurred while she was trying to clear the  foreign body from her pharynx and ended up swallowing the rest of the way. She does not need further ENT follow-up but will be followed by the pulmonologist to make sure her lungs are doing well   Robyn Butler 02/26/2022 2:52 PM

## 2022-02-26 NOTE — Assessment & Plan Note (Addendum)
Continue Synthroid °

## 2022-02-26 NOTE — ED Triage Notes (Signed)
Pt reports was eating shrimp at [employer] and a piece got stuck in her throat. Pt able to speak but states that it is hard. Pt reports her chest hurts where the food is stuck

## 2022-02-26 NOTE — Assessment & Plan Note (Addendum)
With possible aspiration pneumonia versus multifocal pneumonia  Continue Unasyn per pharmacy  Aspiration precautions

## 2022-02-26 NOTE — Assessment & Plan Note (Addendum)
Continue PPI ?

## 2022-02-26 NOTE — Assessment & Plan Note (Addendum)
Multifactorial including aspiration pneumonia and foreign body aspiration in setting of COPD Patient was initially on 7 L of oxygen but has been weaned down to 4 L and is maintaining pulse oximetry greater than 92%. Unable to wean patient down from 4L aspulse oximetry remains at 92% Continue to wean off oxygen as tolerated Will need to assess for oxygen need prior to discharge

## 2022-02-26 NOTE — Assessment & Plan Note (Addendum)
Continue pravastatin Hemoglobin A1c actually low at 5.4 I will discontinue sliding scale and fingersticks.

## 2022-02-26 NOTE — Assessment & Plan Note (Addendum)
Continue amlodipine As needed hydralazine for systolic blood pressure greater than 114mHg

## 2022-02-26 NOTE — Hospital Course (Addendum)
Ms. Hareem Surowiec is a 88 year old female with history of hypothyroid, hyperlipidemia, hypertension, CKD stage IIIa, COPD, former tobacco user quitting in 1993, who presents emergency department for chief concerns of aspiration of a shrimp.  Initial vitals in the emergency department showed temperature of 98.5, respiration rate of 26, blood pressure 174/86, heart rate 94, initial SPO2 of 59% on room air, patient was placed on 15 L nonrebreather with SPO2 improving to 91%.  Serum sodium is 140, potassium 4.0, chloride 106, bicarb 25, BUN of 25, serum creatinine of 1.10, GFR 48, nonfasting blood glucose 205, WBC 7.7, hemoglobin 11.3, platelets of 246.  Portable chest x-ray was read as no focal infiltrates.  No opaque foreign bodies.  Linear densities in left lower lung suggesting scarring.  Neck soft tissue x-ray: Read as no narrowing of the airway.  Epiglottis is unremarkable.  No radiopaque foreign bodies are seen.  CT of the chest without contrast: Was read as linear patchy infiltrates in the right middle lobe and both lower lobes, more so on the left side suggesting possible multifocal atelectasis/pneumonia.  There are nodular densities in the right middle lobe and right lower lobe measuring 10 mm in size.  May be related to pneumonia.  Recommend follow-up CT in 3 months.  No radiopaque foreign bodies are seen.  ENT ENT was able to do a flexible laryngoscope and pushed a shrimp scale downward where she was able to swallow this.  Patient had no further issues swallowing.  Patient was given IV Unasyn for aspiration pneumonia and switched over to Augmentin and will complete a total of 5 days.  Unfortunately we were unable to get the patient off oxygen and she desaturated into the 80s with ambulation.  Patient was set up for 24/7 oxygen.

## 2022-02-26 NOTE — H&P (Signed)
History and Physical   Robyn Butler:[id number] DOB: [birth year] DOA: 02/26/2022  PCP: Kirk Ruths, MD  Patient coming from: [facility] via EMS  I have personally briefly reviewed patient's old medical records in [place].  Chief Concern: Aspiration  HPI: [name] is a 88 year old female with history of hypothyroid, hyperlipidemia, hypertension, CKD stage IIIa, COPD, former tobacco user quitting in 1993, who presents emergency department for chief concerns of aspiration of a shrimp.  Initial vitals in the emergency department showed temperature of 98.5, respiration rate of 26, blood pressure 174/86, heart rate 94, initial SPO2 of 59% on room air, patient was placed on 15 L nonrebreather with SPO2 improving to 91%.  Serum sodium is 140, potassium 4.0, chloride 106, bicarb 25, BUN of 25, serum creatinine of 1.10, GFR 48, nonfasting blood glucose 205, WBC 7.7, hemoglobin 11.3, platelets of 246.  Portable chest x-ray was read as no focal infiltrates.  No opaque foreign bodies.  Linear densities in left lower lung suggesting scarring.  Neck soft tissue x-ray: Read as no narrowing of the airway.  Epiglottis is unremarkable.  No radiopaque foreign bodies are seen.  CT of the chest without contrast: Was read as linear patchy infiltrates in the right middle lobe and both lower lobes, more so on the left side suggesting possible multifocal atelectasis/pneumonia.  There are nodular densities in the right middle lobe and right lower lobe measuring 10 mm in size.  May be related to pneumonia.  Recommend follow-up CT in 3 months.  No radiopaque foreign bodies are seen.  ED treatment: Unasyn 3 g IV.  EDP consulted ENT and pulmonologist.  In the emergency department patient received flexible laryngoscopy via ENT provider and dislodge of small piece of shrimp scale and pushing it downwards into the post arytenoid area which she swallowed.  At bedside patient was able to  tell me her name, her age, she knows the current calendar year and she knows she is in the hospital.  She is able to identify her daughter Thayer Headings at bedside.  Patient states she was eating lunch with her church friends.  She reports that she was into her 10th shrimp when she had difficulty breathing and experience coughing.  She endorses shortness of breath at that time and difficulty speaking.  She denies chest pain, abdominal pain, fever, changes to her cough, patient has a baseline cough that has been ongoing due to COPD.  She denies dysuria, diarrhea, syncope, loss of consciousness.  Social history: She is a former tobacco user quitting in 1993.  She denies EtOH and recreational drug use.  She is currently retired.  ROS: Constitutional: no weight change, no fever ENT/Mouth: no sore throat, no rhinorrhea Eyes: no eye pain, no vision changes Cardiovascular: no chest pain, + dyspnea,  no edema, no palpitations Respiratory: + cough, no sputum, no wheezing Gastrointestinal: no nausea, no vomiting, no diarrhea, no constipation Genitourinary: no urinary incontinence, no dysuria, no hematuria Musculoskeletal: no arthralgias, no myalgias Skin: no skin lesions, no pruritus, Neuro: + weakness, no loss of consciousness, no syncope Psych: no anxiety, no depression, + decrease appetite Heme/Lymph: no bruising, no bleeding  ED Course: Discussed with emergency medicine provider, patient requiring hospitalization for chief concerns of aspiration pneumonia, and requiring bronchoscopy in the a.m.  Assessment/Plan  Principal Problem:   Aspiration into airway Active Problems:   Anemia due to stage 3a chronic kidney disease (HCC)   COPD (chronic obstructive pulmonary disease) (HCC)   Diabetic  neuropathy (HCC)   Esophageal reflux   HLD (hyperlipidemia)   Essential (primary) hypertension   Adult hypothyroidism   History of tobacco use   Stage 3a chronic kidney disease (CKD) (HCC)   Diabetes  mellitus type 2, noninsulin dependent ([place])   Acute respiratory failure with hypoxia (HCC)   Assessment and Plan:  * Aspiration into airway With possible aspiration pneumonia versus multifocal pneumonia - Continue Unasyn per pharmacy - N.p.o. after midnight in anticipation of bronchoscopy in the a.m. with pulmonologist - Aspiration precautions  Acute respiratory failure with hypoxia ([place]) - Multifactorial including aspiration pneumonia and foreign body aspiration in setting of COPD - Continue nonrebreather to maintain SPO2 greater than 92% - Currently on 7 L at bedside - Admit to progressive cardiac, observation  Diabetes mellitus type 2, noninsulin dependent ([place]) Hyperglycemia on nonfasting blood labs - Insulin SSI with at bedtime coverage ordered  Stage 3a chronic kidney disease (CKD) (HCC) - At baseline  Adult hypothyroidism - Levothyroxine 100 mcg daily before breakfast resumed  Essential (primary) hypertension - Hydralazine 5 mg IV every 6 hours as needed for SBP greater than 180, 4 days ordered  HLD (hyperlipidemia) - Pravastatin 40 mg daily resumed  Esophageal reflux - PPI resumed  DVT prophylaxis-I have ordered heparin 5000 units subcutaneous every 8 hours, 2 doses only as I anticipate bronchoscopy in the a.m. with pulmonology team - AM team to reinitiate pharmacologic DVT prophylaxis when the benefits outweigh the risk Chart reviewed.   DVT prophylaxis: Heparin 5000 units subcutaneous every 8 hours, 2 doses ordered Code Status: DNR, discussed well daughter Thayer Headings was at bedside.  Daughter was in agreement Diet: Heart healthy/carb modified now; n.p.o. after midnight Family Communication: Thayer Headings, daughter at bedside with patient's permission Disposition Plan: Pending clinical course Consults called: ENT, pulmonology Admission status: Progressive, observation  Past Medical History:  Diagnosis Date   Arthritis    Cancer ([place])    Chronic kidney disease     COPD (chronic obstructive pulmonary disease) ([place])    Diabetes mellitus without complication ([place])    type 2   Hypertension    Varicose veins of lower extremities with other complications    Past Surgical History:  Procedure Laterality Date   ABDOMINAL HYSTERECTOMY  1972   BLADDER SUSPENSION     BREAST EXCISIONAL BIOPSY Right 1999   neg   CHOLECYSTECTOMY  1975   COLONOSCOPY     COLONOSCOPY WITH PROPOFOL N/A 05/24/2015   Procedure: COLONOSCOPY WITH PROPOFOL;  Surgeon: Hulen Luster, MD;  Location: [facility] ENDOSCOPY;  Service: Gastroenterology;  Laterality: N/A;   CYSTOSCOPY  1972   ESOPHAGOGASTRODUODENOSCOPY N/A 05/24/2015   Procedure: ESOPHAGOGASTRODUODENOSCOPY (EGD);  Surgeon: Hulen Luster, MD;  Location: [facility] ENDOSCOPY;  Service: Gastroenterology;  Laterality: N/A;   FRACTURE SURGERY     JOINT REPLACEMENT Right 2013   hip   salpingo oophorectmy      THYROIDECTOMY  1969   WRIST FRACTURE SURGERY Left    Social History:  reports that she quit smoking about 30 years ago. Her smoking use included cigarettes. She has never used smokeless tobacco. She reports that she does not drink alcohol and does not use drugs.  Allergies  Allergen Reactions   Alendronate Nausea And Vomiting    Other reaction(s): Vomiting   Family History  Problem Relation Age of Onset   Heart attack Father    Asthma Father    Kidney disease Mother    Hypertension Mother    Hypertension Other  Diabetes Other    Heart attack Other    Breast cancer Neg Hx    Family history: Family history reviewed and not pertinent.  Prior to Admission medications   Medication Sig Start Date End Date Taking? Authorizing Provider  amLODipine (NORVASC) 5 MG tablet TAKE ONE(1) TABLET EACH DAY Patient taking differently: Take 5 mg by mouth daily. 03/22/19   Birdie Sons, MD  aspirin EC 81 MG tablet Take 81 mg by mouth daily.     [provider]  Cholecalciferol (VITAMIN D3) 50 MCG (2000 UT) TABS Take 1 tablet by mouth  daily.    [provider]  Cranberry 500 MG CAPS Take 500 mg by mouth daily.    [provider]  cyanocobalamin 1000 MCG tablet Take 1,000 mcg by mouth daily.    [provider]  dorzolamide (TRUSOPT) 2 % ophthalmic solution Place 1 drop into both eyes 2 (two) times daily.     [provider]  feeding supplement (ENSURE ENLIVE / ENSURE PLUS) LIQD Take 237 mLs by mouth daily. 08/06/21   Enzo Bi, MD  gabapentin (NEURONTIN) 100 MG capsule Take 1 capsule by mouth 2 (two) times daily. 06/01/21 06/01/22  [provider]  ibandronate (BONIVA) 150 MG tablet TAKE ONE TABLET ONCE A MONTH FIRST THING IN THE MORNING AT LEAST 1 HOUR BEFORE EATING, TAKE WITH WATER. 03/07/19   Birdie Sons, MD  latanoprost (XALATAN) 0.005 % ophthalmic solution Place 1 drop into both eyes at bedtime.     [provider]  levothyroxine (SYNTHROID) 100 MCG tablet TAKE 1 TABLET BY MOUTH EVERY DAY 03/22/19   Birdie Sons, MD  loperamide (IMODIUM) 2 MG capsule Take 2 mg by mouth as needed for diarrhea or loose stools.    [provider]  lovastatin (MEVACOR) 40 MG tablet TAKE 1 TABLET BY MOUTH AT BEDTIME 08/28/19   Birdie Sons, MD  magnesium oxide (MAG-OX) 400 MG tablet Take 400 mg by mouth daily.    [provider]  Multiple Vitamins-Minerals (PRESERVISION AREDS 2) CAPS Take 1 capsule by mouth 2 (two) times daily.    [provider]  nortriptyline (PAMELOR) 25 MG capsule TAKE 1 CAPSULE(25 MG) BY MOUTH AT BEDTIME 08/16/19   Birdie Sons, MD  omeprazole (PRILOSEC) 40 MG capsule Take 1 capsule (40 mg total) by mouth daily. 09/17/18   Birdie Sons, MD  Polyethylene Glycol 3350 (DULCOLAX BALANCE PO) Take 2 tablets by mouth as needed.    [provider]  SPIRIVA RESPIMAT 2.5 MCG/ACT AERS SMARTSIG:2 Puff(s) By Mouth Daily 06/09/19   [provider]  valsartan-hydrochlorothiazide (DIOVAN-HCT) 160-25 MG tablet Take 1 tablet by mouth  daily. 09/17/18   Birdie Sons, MD  zinc sulfate 220 (50 Zn) MG capsule Take 220 mg by mouth daily.    [provider]   Physical Exam: Vitals:   02/26/22 1351 02/26/22 1358 02/26/22 1651 02/26/22 1700  BP:  (!) 174/86  (!) 189/62  Pulse:  94  91  Resp:  (!) 26  18  Temp:  98.5 F (36.9 C)    TempSrc:  Oral    SpO2:  (!) 59% 93% 93%  Weight: 55 kg     Height: '5\' 2"'$  (1.575 m)      Constitutional: appears younger than chronological age, frail, NAD, calm, comfortable Eyes: PERRL, lids and conjunctivae normal ENMT: Mucous membranes are moist. Posterior pharynx clear of any exudate or lesions. Age-appropriate dentition.  Mild bilateral  hearing loss Neck: normal, supple, no masses, no thyromegaly Respiratory: clear to auscultation bilaterally, no wheezing, no crackles. Normal respiratory effort. No accessory muscle use.  Cardiovascular: Regular rate and rhythm, no murmurs / rubs / gallops. No extremity edema. 2+ pedal pulses. No carotid bruits.  Abdomen: no tenderness, no masses palpated, no hepatosplenomegaly. Bowel sounds positive.  Musculoskeletal: no clubbing / cyanosis. No joint deformity upper and lower extremities. Good ROM, no contractures, no atrophy. Normal muscle tone.  Skin: no rashes, lesions, ulcers. No induration Neurologic: Sensation intact. Strength 5/5 in all 4.  Psychiatric: Normal judgment and insight. Alert and oriented x 3. Normal mood.   EKG: Not indicated at this time  Chest x-ray on Admission: I personally reviewed and I agree with radiologist reading as below.  CT CHEST WO CONTRAST  Result Date: 02/26/2022 CLINICAL DATA:  Foreign body suspected, chest pain EXAM: CT CHEST WITHOUT CONTRAST TECHNIQUE: Multidetector CT imaging of the chest was performed following the standard protocol without IV contrast. RADIATION DOSE REDUCTION: This exam was performed according to the departmental dose-optimization program which includes automated exposure control,  adjustment of the mA and/or kV according to patient size and/or use of iterative reconstruction technique. COMPARISON:  CT done on 02/06/2008 and chest radiograph done earlier today. FINDINGS: Cardiovascular: Coronary artery calcifications are seen. Dense calcifications are seen in thoracic aorta. Minimal pericardial effusion is seen. Mediastinum/Nodes: No significant lymphadenopathy is seen. Thyroid is smaller than usual. Lungs/Pleura: There are no filling defects in the lumen of trachea and major bronchi.There are linear patchy infiltrates in right middle lobe and both lower lobes, more so on the left side. In image 96 of series 3, there is a 9 mm nodular density in right middle lobe. In image 85, there is 10 mm nodular density in right lower lobe. There is no pleural effusion or pneumothorax. Upper Abdomen: Unremarkable. Musculoskeletal: No acute findings are seen. IMPRESSION: There are linear patchy infiltrates in right middle lobe and both lower lobes, more so on the left side suggesting possible multifocal atelectasis/pneumonia. Part of this finding may suggest underlying scarring. There is no pleural effusion. There are nodular densities in right middle lobe and right lower lobe measuring up to 10 mm in size. This finding may be related to pneumonia. Follow-up CT in 3 months should be considered to assess resolution and to rule out underlying neoplastic process. No radiopaque foreign bodies are seen. Atherosclerotic changes are noted with extensive coronary artery calcifications. Minimal pericardial effusion is seen. Electronically Signed   By: Elmer Picker M.D.   On: 02/26/2022 15:51   DG Chest Portable 1 View  Result Date: 02/26/2022 CLINICAL DATA:  Evaluate for foreign body EXAM: PORTABLE CHEST 1 VIEW COMPARISON:  08/03/2021 FINDINGS: Cardiac size is within normal limits. Lung fields are clear of any infiltrates or pulmonary edema. Linear densities in left lower lung fields suggest scarring. No  radiopaque foreign bodies are seen. There is no shift of mediastinum. IMPRESSION: There are no new focal infiltrates. There are no opaque foreign bodies. Linear densities in left lower lung fields suggest scarring. Electronically Signed   By: Elmer Picker M.D.   On: 02/26/2022 14:34   DG Neck Soft Tissue  Result Date: 02/26/2022 CLINICAL DATA:  Foreign body EXAM: NECK SOFT TISSUES - 1+ VIEW COMPARISON:  None Available. FINDINGS: There is no significant narrowing of the airways. Epiglottis is not thickened. There are linear calcifications in the neck, possibly vascular. IMPRESSION: There is no narrowing of the airway. Epiglottis is  unremarkable. No radiopaque foreign bodies are seen. Electronically Signed   By: Elmer Picker M.D.   On: 02/26/2022 14:33    Labs on Admission: I have personally reviewed following labs  CBC: Recent Labs  Lab 02/20/22 1314 02/26/22 1409  WBC 5.4 7.7  NEUTROABS 2.9 4.3  HGB 9.9* 11.3*  HCT 32.1* 37.9  MCV 100.9* 100.5*  PLT 192 161   Basic Metabolic Panel: Recent Labs  Lab 02/26/22 1409  NA 140  K 4.0  CL 106  CO2 25  GLUCOSE 205*  BUN 25*  CREATININE 1.10*  CALCIUM 9.3   GFR: Estimated Creatinine Clearance: 26.9 mL/min (A) (by C-G formula based on SCr of 1.1 mg/dL (H)).  Urine analysis:    Component Value Date/Time   COLORURINE YELLOW (A) 08/03/2021 2159   APPEARANCEUR HAZY (A) 08/03/2021 2159   APPEARANCEUR Hazy 11/08/2012 1006   LABSPEC 1.005 08/03/2021 2159   LABSPEC 1.015 11/08/2012 1006   PHURINE 7.0 08/03/2021 2159   GLUCOSEU NEGATIVE 08/03/2021 2159   GLUCOSEU Negative 11/08/2012 1006   HGBUR MODERATE (A) 08/03/2021 2159   BILIRUBINUR NEGATIVE 08/03/2021 2159   BILIRUBINUR Negative 11/08/2012 [place] 08/03/2021 2159   PROTEINUR 100 (A) 08/03/2021 2159   NITRITE NEGATIVE 08/03/2021 2159   LEUKOCYTESUR SMALL (A) 08/03/2021 2159   LEUKOCYTESUR Trace 11/08/2012 1006   Dr. Tobie Poet [facility]  If 7PM-7AM, please contact overnight-coverage provider If 7AM-7PM, please contact day coverage provider [email or web address]  02/26/2022, 5:37 PM

## 2022-02-26 NOTE — Consult Note (Addendum)
NAME:  Robyn Butler, MRN:  [id number], DOB:  1929/02/10, LOS: 0 ADMISSION DATE:  02/26/2022, CONSULTATION DATE:  02/26/2022 CHIEF COMPLAINT:  Foreign Body Aspiration  History of Present Illness:  Robyn Butler is a pleasant 88 year old female with a history of COPD, hypertension, CKD, type 2 diabetes, hypothyroidism, and iron deficiency anemia who presents to the hospital after she aspirated a piece of shrimp while at a seafood restaurant.  Patient reports that she was eating shrimp at the [employer] when she felt like something got stuck in her throat followed by difficulty breathing and a sensation of chest/neck tightness.  On presentation to the ED, she was noted to be significantly hypoxic requiring oxygen supplementation.  ENT were consulted who performed fiberoptic evaluation of her airway and noted small piece of shrimp above the arytenoids that they were able to dislodge.  Following this, her oxygenation improved and she was transitioned to nasal cannula. CT scan of the chest was obtained and did not show aspirated material in her airway but was suggestive of aspiration PNA. Pulmonary is consulted to further guide management.   Pertinent  Medical History  1.  Hypertension 2.  COPD 3.  CKD 4.  Type 2 diabetes 5.  Hypothyroidism 6.  Iron deficiency anemia  Significant Hospital Events: Including procedures, antibiotic start and stop dates in addition to other pertinent events   Presented on 8/13, flexible laryngoscopy performed by ENT  Interim History / Subjective:  She reports feeling better after the flexible laryngoscopy.  She has no reported chest pain or shortness of breath.  She has a cough that she reports is chronic and nonproductive.  She denies any fevers or chills.  Objective   Blood pressure (!) 174/86, pulse 94, temperature 98.5 F (36.9 C), temperature source Oral, resp. rate (!) 26, height '5\' 2"'$  (1.575 m), weight 55 kg, SpO2 (!) 59 %.       No intake or  output data in the 24 hours ending 02/26/22 1638 Filed Weights   02/26/22 1351  Weight: 55 kg    Examination: General: Awake, alert, oriented.  Not in acute distress.  Hard of hearing HENT: No foreign bodies on inspection.  Pupils equal and reactive. Lungs: Good bilateral air entry with bilateral rails noted in the mid lung fields. Cardiovascular: Regular rate and rhythm, normal S1-S2 Abdomen: Soft, nondistended, positive bowel sounds Neuro: Alert, awake, oriented x3, moving all extremities with no focal weakness.  Resolved Hospital Problem list     Assessment & Plan:   #Foreign body aspiration #Aspiration pneumonitis versus aspiration pneumonia  This patient is a 88 year old female who presents to the ED after aspiration of foreign body (food, shrimp) and found to to be severely hypoxic requiring significant amounts of oxygen supplementation.  Fiberoptic endoscopy showed a piece of shrimp over her vocal cords which was removed. CT chest without contrast obtained by the ED team does not show any findings suggestive of retained food products/aspirated food products. It does, however, show signs of bilateral lower lobe aspiration pneumonia.  It also shows right lower lobe two millimeter nodular density which will need to be followed up with a repeat CAT scan.  Given that there are no large food products in her airway there is no emergent need to proceed with flexible and/or rigid bronchoscopy.  I did discuss flexible bronchoscopy with the patient to which she is amenable. This would be to evaluate her airway for any retained food particles that were not evident on  chest CT.  She is not n.p.o. which would complicate our ability to perform a fiberoptic evaluation.  Furthermore, I would prefer to perform this procedure under conscious sedation, We currently lack the tools necessary to adequately anesthetize her upper airway to facilitate fiberoptic intubation with the bronchoscope.  I would  recommend medical management with IV antibiotics covering for aspiration pneumonia as well as pulmonary toilet with chest PT, standing nebulizers, and aggressive incentive spirometry.  I would also recommend making the patient n.p.o. after midnight for possible flexible bronchoscopy tomorrow.  -Antibiotics for aspiration PNA -Pulmonary toilet, chest PT -incentive spirometry -standing nebs, recommend albuterol every 6 hours -outpatient follow up for pulmonary nodule  Armando Reichert, MD [facility]  Labs   CBC: Recent Labs  Lab 02/20/22 1314 02/26/22 1409  WBC 5.4 7.7  NEUTROABS 2.9 4.3  HGB 9.9* 11.3*  HCT 32.1* 37.9  MCV 100.9* 100.5*  PLT 192 323    Basic Metabolic Panel: Recent Labs  Lab 02/26/22 1409  NA 140  K 4.0  CL 106  CO2 25  GLUCOSE 205*  BUN 25*  CREATININE 1.10*  CALCIUM 9.3   GFR: Estimated Creatinine Clearance: 26.9 mL/min (A) (by C-G formula based on SCr of 1.1 mg/dL (H)). Recent Labs  Lab 02/20/22 1314 02/26/22 1409  WBC 5.4 7.7    Liver Function Tests: No results for input(s): "AST", "ALT", "ALKPHOS", "BILITOT", "PROT", "ALBUMIN" in the last 168 hours. No results for input(s): "LIPASE", "AMYLASE" in the last 168 hours. No results for input(s): "AMMONIA" in the last 168 hours.  ABG No results found for: "PHART", "PCO2ART", "PO2ART", "HCO3", "TCO2", "ACIDBASEDEF", "O2SAT"   Coagulation Profile: No results for input(s): "INR", "PROTIME" in the last 168 hours.  Cardiac Enzymes: No results for input(s): "CKTOTAL", "CKMB", "CKMBINDEX", "TROPONINI" in the last 168 hours.  HbA1C: Hgb A1c MFr Bld  Date/Time Value Ref Range Status  08/03/2021 06:30 PM 5.9 (H) 4.8 - 5.6 % Final    Comment:    (NOTE) Pre diabetes:          5.7%-6.4%  Diabetes:              >6.4%  Glycemic control for   <7.0% adults with diabetes   01/07/2019 11:44 AM 5.5 4.8 - 5.6 % Final    Comment:             Prediabetes: 5.7 - 6.4           Diabetes: >6.4          Glycemic control for adults with diabetes: <7.0     CBG: No results for input(s): "GLUCAP" in the last 168 hours.  Review of Systems:   Review of Systems  Constitutional:  Negative for chills and fever.  HENT:  Positive for hearing loss.   Respiratory:  Positive for cough and shortness of breath. Negative for wheezing.   Cardiovascular:  Negative for chest pain.  Gastrointestinal:  Negative for abdominal pain.  Neurological:  Negative for dizziness.    Past Medical History:  She,  has a past medical history of Arthritis, Cancer ([place]), Chronic kidney disease, COPD (chronic obstructive pulmonary disease) ([place]), Diabetes mellitus without complication ([place]), Hypertension, and Varicose veins of lower extremities with other complications.   Surgical History:   Past Surgical History:  Procedure Laterality Date   ABDOMINAL HYSTERECTOMY  1972   BLADDER SUSPENSION     BREAST EXCISIONAL BIOPSY Right 1999   neg   CHOLECYSTECTOMY  1975  COLONOSCOPY     COLONOSCOPY WITH PROPOFOL N/A 05/24/2015   Procedure: COLONOSCOPY WITH PROPOFOL;  Surgeon: Hulen Luster, MD;  Location: [facility] ENDOSCOPY;  Service: Gastroenterology;  Laterality: N/A;   CYSTOSCOPY  1972   ESOPHAGOGASTRODUODENOSCOPY N/A 05/24/2015   Procedure: ESOPHAGOGASTRODUODENOSCOPY (EGD);  Surgeon: Hulen Luster, MD;  Location: [facility]COPY;  Service: Gastroenterology;  Laterality: N/A;   FRACTURE SURGERY     JOINT REPLACEMENT Right 2013   hip   salpingo oophorectmy      THYROIDECTOMY  1969   WRIST FRACTURE SURGERY Left      Social History:   reports that she quit smoking about 30 years ago. Her smoking use included cigarettes. She has never used smokeless tobacco. She reports that she does not drink alcohol and does not use drugs.   Family History:  Her family history includes Asthma in her father; Diabetes in an other family member; Heart attack in her father and another family member; Hypertension in her mother  and another family member; Kidney disease in her mother. There is no history of Breast cancer.   Allergies Allergies  Allergen Reactions   Alendronate Nausea And Vomiting    Other reaction(s): Vomiting     Home Medications  Prior to Admission medications   Medication Sig Start Date End Date Taking? Authorizing Provider  amLODipine (NORVASC) 5 MG tablet TAKE ONE(1) TABLET EACH DAY Patient taking differently: Take 5 mg by mouth daily. 03/22/19   Birdie Sons, MD  aspirin EC 81 MG tablet Take 81 mg by mouth daily.     [provider]  Cholecalciferol (VITAMIN D3) 50 MCG (2000 UT) TABS Take 1 tablet by mouth daily.    [provider]  Cranberry 500 MG CAPS Take 500 mg by mouth daily.    [provider]  cyanocobalamin 1000 MCG tablet Take 1,000 mcg by mouth daily.    [provider]  dorzolamide (TRUSOPT) 2 % ophthalmic solution Place 1 drop into both eyes 2 (two) times daily.     [provider]  feeding supplement (ENSURE ENLIVE / ENSURE PLUS) LIQD Take 237 mLs by mouth daily. 08/06/21   Enzo Bi, MD  gabapentin (NEURONTIN) 100 MG capsule Take 1 capsule by mouth 2 (two) times daily. 06/01/21 06/01/22  [provider]  ibandronate (BONIVA) 150 MG tablet TAKE ONE TABLET ONCE A MONTH FIRST THING IN THE MORNING AT LEAST 1 HOUR BEFORE EATING, TAKE WITH WATER. 03/07/19   Birdie Sons, MD  latanoprost (XALATAN) 0.005 % ophthalmic solution Place 1 drop into both eyes at bedtime.     [provider]  levothyroxine (SYNTHROID) 100 MCG tablet TAKE 1 TABLET BY MOUTH EVERY DAY 03/22/19   Birdie Sons, MD  loperamide (IMODIUM) 2 MG capsule Take 2 mg by mouth as needed for diarrhea or loose stools.    [provider]  lovastatin (MEVACOR) 40 MG tablet TAKE 1 TABLET BY MOUTH AT BEDTIME 08/28/19   Birdie Sons, MD  magnesium oxide (MAG-OX) 400 MG tablet Take 400 mg by mouth daily.    [provider]  Multiple  Vitamins-Minerals (PRESERVISION AREDS 2) CAPS Take 1 capsule by mouth 2 (two) times daily.    [provider]  nortriptyline (PAMELOR) 25 MG capsule TAKE 1 CAPSULE(25 MG) BY MOUTH AT BEDTIME 08/16/19   Birdie Sons, MD  omeprazole (PRILOSEC) 40 MG capsule Take 1 capsule (40 mg total) by mouth daily. 09/17/18   Birdie Sons, MD  Polyethylene Glycol 3350 (DULCOLAX BALANCE PO) Take 2 tablets by mouth as needed.    [provider]  SPIRIVA RESPIMAT 2.5 MCG/ACT AERS SMARTSIG:2 Puff(s) By Mouth Daily 06/09/19   [provider]  valsartan-hydrochlorothiazide (DIOVAN-HCT) 160-25 MG tablet Take 1 tablet by mouth daily. 09/17/18   Birdie Sons, MD  zinc sulfate 220 (50 Zn) MG capsule Take 220 mg by mouth daily.    [provider]     Total time spent caring for the patient today was 60 minutes. This includes time reviewing the patient's chart, interviewing the patient, obtaining a history, performing a physical exam, communicating with her ED medical providers, and coordinating for procedures.    Armando Reichert, MD  Pulmonary and Critical Care Medicine

## 2022-02-26 NOTE — Op Note (Signed)
02/26/2022  2:47 PM    Robyn Butler, Robyn Butler  [id number]   Pre-Op Dx: Possible foreign body of the larynx,  Post-op Dx: Foreign body of the posterior pharyngeal wall with a normal appearing larynx and subglottic area.  Proc: Flexible laryngoscopy  Surg:  Elon Alas Jameison Haji  Anes:  GOT  EBL: None  Comp: None  Findings: A very small piece of shrimp shell was jabbed into the posterior pharyngeal wall and about the level of the tip of the epiglottis slightly to the left side.  The larynx was completely clear and no other foreign bodies were found anywhere.     Procedure: The patient was seen in the emergency room emergently for evaluation of her airway.  She was eating shrimp at a seafood very near the hospital and got suddenly short of breath felt like she had potentially aspirated something.  She had some discomfort when she swallowed and some shortness of breath.  She was brought to the emergency room and her O2 sats were 59%.  She is been put on a nonrebreather and feeling better.  The flexible scope was passed through her left nostril to visualize her hypopharynx and larynx.  The nose and nasopharynx are clear.  On the posterior pharyngeal wall about 2 cm above the arytenoids there was a piece of shrimp shell that was pushed into the posterior wall and stuck there.  The piece was no larger than 1/4 inch.  There were no other foreign bodies noted in the hypopharynx.  The laryngeal inlet was clear without any redness or irritation.  The subglottic space appeared to be clear as well.  There is no redness or inflammation anywhere.  She was handling her secretions very well.  Using the flexible laryngoscope and pushing against the posterior wall I was able to dislodge the small piece of shrimp scale and push it downward.  It went into her post arytenoid area and then she swallowed this.  She did not have any pain from this or sensation from this.  This was not causing any shortness of breath and I do  not believe is a source of her lung issue.  The patient tolerated the procedure very well.  Dispo:   To be evaluated further by CT and pulmonology  Plan: She is scheduled to get a CT scan to make sure she is doing well.  Her O2 sats are 100% on a nonrebreather mask.  [name]  02/26/2022 2:47 PM

## 2022-02-26 NOTE — ED Provider Notes (Signed)
Emergency department handoff note  Care of this patient was signed out to me at the end of the previous provider shift.  All pertinent patient information was conveyed and all questions were answered.  Patient pending CT of the chest as well as evaluation by pulmonology for possible bronchoscopy.  Given that patient is not n.p.o. today as well as a CT of the chest showing patent large airways, patient is stable for n.p.o. at midnight for bronchoscopy tomorrow.  Therefore, I spoke to Dr. Tobie Poet in hospital medicine who agrees to accept this patient onto her service for further evaluation and management.  Dispo: Admit to medicine   Naaman Plummer, MD 02/26/22 [phone]

## 2022-02-27 DIAGNOSIS — Z825 Family history of asthma and other chronic lower respiratory diseases: Secondary | ICD-10-CM | POA: Diagnosis not present

## 2022-02-27 DIAGNOSIS — E1169 Type 2 diabetes mellitus with other specified complication: Secondary | ICD-10-CM | POA: Diagnosis present

## 2022-02-27 DIAGNOSIS — Z96641 Presence of right artificial hip joint: Secondary | ICD-10-CM | POA: Diagnosis present

## 2022-02-27 DIAGNOSIS — T17908D Unspecified foreign body in respiratory tract, part unspecified causing other injury, subsequent encounter: Secondary | ICD-10-CM | POA: Diagnosis not present

## 2022-02-27 DIAGNOSIS — N1831 Chronic kidney disease, stage 3a: Secondary | ICD-10-CM | POA: Diagnosis not present

## 2022-02-27 DIAGNOSIS — Z87891 Personal history of nicotine dependence: Secondary | ICD-10-CM | POA: Diagnosis not present

## 2022-02-27 DIAGNOSIS — E785 Hyperlipidemia, unspecified: Secondary | ICD-10-CM | POA: Diagnosis present

## 2022-02-27 DIAGNOSIS — J690 Pneumonitis due to inhalation of food and vomit: Secondary | ICD-10-CM | POA: Diagnosis present

## 2022-02-27 DIAGNOSIS — J9621 Acute and chronic respiratory failure with hypoxia: Secondary | ICD-10-CM | POA: Diagnosis present

## 2022-02-27 DIAGNOSIS — T17228A Food in pharynx causing other injury, initial encounter: Secondary | ICD-10-CM | POA: Diagnosis present

## 2022-02-27 DIAGNOSIS — K219 Gastro-esophageal reflux disease without esophagitis: Secondary | ICD-10-CM | POA: Diagnosis present

## 2022-02-27 DIAGNOSIS — D631 Anemia in chronic kidney disease: Secondary | ICD-10-CM | POA: Diagnosis present

## 2022-02-27 DIAGNOSIS — D509 Iron deficiency anemia, unspecified: Secondary | ICD-10-CM | POA: Diagnosis present

## 2022-02-27 DIAGNOSIS — J9601 Acute respiratory failure with hypoxia: Secondary | ICD-10-CM | POA: Diagnosis not present

## 2022-02-27 DIAGNOSIS — Z888 Allergy status to other drugs, medicaments and biological substances status: Secondary | ICD-10-CM | POA: Diagnosis not present

## 2022-02-27 DIAGNOSIS — N1832 Chronic kidney disease, stage 3b: Secondary | ICD-10-CM | POA: Diagnosis present

## 2022-02-27 DIAGNOSIS — T17908A Unspecified foreign body in respiratory tract, part unspecified causing other injury, initial encounter: Secondary | ICD-10-CM | POA: Diagnosis not present

## 2022-02-27 DIAGNOSIS — E890 Postprocedural hypothyroidism: Secondary | ICD-10-CM | POA: Diagnosis present

## 2022-02-27 DIAGNOSIS — E1122 Type 2 diabetes mellitus with diabetic chronic kidney disease: Secondary | ICD-10-CM | POA: Diagnosis present

## 2022-02-27 DIAGNOSIS — M1990 Unspecified osteoarthritis, unspecified site: Secondary | ICD-10-CM | POA: Diagnosis present

## 2022-02-27 DIAGNOSIS — E1140 Type 2 diabetes mellitus with diabetic neuropathy, unspecified: Secondary | ICD-10-CM | POA: Diagnosis present

## 2022-02-27 DIAGNOSIS — I129 Hypertensive chronic kidney disease with stage 1 through stage 4 chronic kidney disease, or unspecified chronic kidney disease: Secondary | ICD-10-CM | POA: Diagnosis present

## 2022-02-27 DIAGNOSIS — Z8249 Family history of ischemic heart disease and other diseases of the circulatory system: Secondary | ICD-10-CM | POA: Diagnosis not present

## 2022-02-27 DIAGNOSIS — J449 Chronic obstructive pulmonary disease, unspecified: Secondary | ICD-10-CM | POA: Diagnosis present

## 2022-02-27 DIAGNOSIS — Z841 Family history of disorders of kidney and ureter: Secondary | ICD-10-CM | POA: Diagnosis not present

## 2022-02-27 DIAGNOSIS — H9190 Unspecified hearing loss, unspecified ear: Secondary | ICD-10-CM | POA: Diagnosis present

## 2022-02-27 LAB — BASIC METABOLIC PANEL
Anion gap: 6 (ref 5–15)
BUN: 25 mg/dL — ABNORMAL HIGH (ref 8–23)
CO2: 23 mmol/L (ref 22–32)
Calcium: 7.5 mg/dL — ABNORMAL LOW (ref 8.9–10.3)
Chloride: 111 mmol/L (ref 98–111)
Creatinine, Ser: 1.33 mg/dL — ABNORMAL HIGH (ref 0.44–1.00)
GFR, Estimated: 38 mL/min — ABNORMAL LOW (ref >60)
Glucose, Bld: 124 mg/dL — ABNORMAL HIGH (ref 70–99)
Potassium: 4 mmol/L (ref 3.5–5.1)
Sodium: 140 mmol/L (ref 135–145)

## 2022-02-27 LAB — CBC WITH DIFFERENTIAL/PLATELET
Abs Immature Granulocytes: 0.1 10*3/uL — ABNORMAL HIGH (ref 0.00–0.07)
Basophils Absolute: 0 10*3/uL (ref 0.0–0.1)
Basophils Relative: 0 %
Eosinophils Absolute: 0.3 10*3/uL (ref 0.0–0.5)
Eosinophils Relative: 2 %
HCT: 32.3 % — ABNORMAL LOW (ref 36.0–46.0)
Hemoglobin: 9.6 g/dL — ABNORMAL LOW (ref 12.0–15.0)
Immature Granulocytes: 1 %
Lymphocytes Relative: 10 %
Lymphs Abs: 1.9 10*3/uL (ref 0.7–4.0)
MCH: 30.5 pg (ref 26.0–34.0)
MCHC: 29.7 g/dL — ABNORMAL LOW (ref 30.0–36.0)
MCV: 102.5 fL — ABNORMAL HIGH (ref 80.0–100.0)
Monocytes Absolute: 2.2 10*3/uL — ABNORMAL HIGH (ref 0.1–1.0)
Monocytes Relative: 12 %
Neutro Abs: 14.5 10*3/uL — ABNORMAL HIGH (ref 1.7–7.7)
Neutrophils Relative %: 75 %
Platelets: 210 10*3/uL (ref 150–400)
RBC: 3.15 MIL/uL — ABNORMAL LOW (ref 3.87–5.11)
RDW: 14.1 % (ref 11.5–15.5)
WBC: 19.1 10*3/uL — ABNORMAL HIGH (ref 4.0–10.5)
nRBC: 0 % (ref 0.0–0.2)

## 2022-02-27 LAB — CBG MONITORING, ED
Glucose-Capillary: 114 mg/dL — ABNORMAL HIGH (ref 70–99)
Glucose-Capillary: 120 mg/dL — ABNORMAL HIGH (ref 70–99)

## 2022-02-27 LAB — HEMOGLOBIN A1C
Hgb A1c MFr Bld: 5.4 % (ref 4.8–5.6)
Mean Plasma Glucose: 108.28 mg/dL

## 2022-02-27 LAB — GLUCOSE, CAPILLARY
Glucose-Capillary: 120 mg/dL — ABNORMAL HIGH (ref 70–99)
Glucose-Capillary: 147 mg/dL — ABNORMAL HIGH (ref 70–99)

## 2022-02-27 LAB — CBC
HCT: 28.7 % — ABNORMAL LOW (ref 36.0–46.0)
Hemoglobin: 8.5 g/dL — ABNORMAL LOW (ref 12.0–15.0)
MCH: 30.4 pg (ref 26.0–34.0)
MCHC: 29.6 g/dL — ABNORMAL LOW (ref 30.0–36.0)
MCV: 102.5 fL — ABNORMAL HIGH (ref 80.0–100.0)
Platelets: 194 10*3/uL (ref 150–400)
RBC: 2.8 MIL/uL — ABNORMAL LOW (ref 3.87–5.11)
RDW: 13.7 % (ref 11.5–15.5)
WBC: 21.4 10*3/uL — ABNORMAL HIGH (ref 4.0–10.5)
nRBC: 0 % (ref 0.0–0.2)

## 2022-02-27 MED ORDER — ENOXAPARIN SODIUM 30 MG/0.3ML IJ SOSY
30.0000 mg | PREFILLED_SYRINGE | INTRAMUSCULAR | Status: DC
Start: 2022-02-27 — End: 2022-03-02
  Administered 2022-02-27 – 2022-03-01 (×3): 30 mg via SUBCUTANEOUS
  Filled 2022-02-27 (×3): qty 0.3

## 2022-02-27 MED ORDER — TIOTROPIUM BROMIDE MONOHYDRATE 18 MCG IN CAPS
18.0000 ug | ORAL_CAPSULE | Freq: Every day | RESPIRATORY_TRACT | Status: DC
  Administered 2022-02-27 – 2022-03-02 (×4): 18 ug via RESPIRATORY_TRACT
  Filled 2022-02-27 (×2): qty 5

## 2022-02-27 MED ORDER — ASPIRIN 81 MG PO TBEC
81.0000 mg | DELAYED_RELEASE_TABLET | Freq: Every day | ORAL | Status: DC
  Administered 2022-02-27 – 2022-03-02 (×4): 81 mg via ORAL
  Filled 2022-02-27 (×4): qty 1

## 2022-02-27 NOTE — Evaluation (Addendum)
Clinical/Bedside Swallow Evaluation Patient Details  Name: Robyn Butler MRN: [id number] Date of Birth: [birth year]  Today's Date: 02/27/2022 Time: SLP Start Time (ACUTE ONLY): 1215 SLP Stop Time (ACUTE ONLY): 1315 SLP Time Calculation (min) (ACUTE ONLY): 60 min  Past Medical History:  Past Medical History:  Diagnosis Date   Arthritis    Cancer ([place])    Chronic kidney disease    COPD (chronic obstructive pulmonary disease) ([place])    Diabetes mellitus without complication ([place])    type 2   Hypertension    Varicose veins of lower extremities with other complications    Past Surgical History:  Past Surgical History:  Procedure Laterality Date   ABDOMINAL HYSTERECTOMY  1972   BLADDER SUSPENSION     BREAST EXCISIONAL BIOPSY Right 1999   neg   CHOLECYSTECTOMY  1975   COLONOSCOPY     COLONOSCOPY WITH PROPOFOL N/A 05/24/2015   Procedure: COLONOSCOPY WITH PROPOFOL;  Surgeon: Hulen Luster, MD;  Location: [facility] ENDOSCOPY;  Service: Gastroenterology;  Laterality: N/A;   CYSTOSCOPY  1972   ESOPHAGOGASTRODUODENOSCOPY N/A 05/24/2015   Procedure: ESOPHAGOGASTRODUODENOSCOPY (EGD);  Surgeon: Hulen Luster, MD;  Location: [facility] ENDOSCOPY;  Service: Gastroenterology;  Laterality: N/A;   FRACTURE SURGERY     JOINT REPLACEMENT Right 2013   hip   salpingo oophorectmy      THYROIDECTOMY  1969   WRIST FRACTURE SURGERY Left    HPI:  Pt is a pleasant 88 year old female with a history of COPD, hypertension, hoh, CKD, type 2 diabetes, hypothyroidism, and iron deficiency anemia who presents to the hospital after she aspirated a piece of shrimp while at a seafood restaurant.     Patient reports that she was eating shrimp at the [employer] when she felt like something got stuck in her throat followed by difficulty breathing and a sensation of chest/neck tightness.  On presentation to the ED, she was noted to be significantly hypoxic requiring oxygen supplementation.  ENT were consulted who performed fiberoptic  evaluation of her airway and noted small piece of shrimp above the arytenoids that they were able to dislodge; swallowed by pt.  Following this, her oxygenation improved and she was transitioned to nasal cannula. CT scan of the chest was obtained and did not show aspirated material in her airway but was suggestive of aspiration PNA.  CT of Chest: There are linear patchy infiltrates in right middle lobe and both  lower lobes, more so on the left side suggesting possible multifocal  atelectasis/pneumonia. Part of this finding may suggest underlying  scarring. There is no pleural effusion.     There are nodular densities in right middle lobe and right lower  lobe measuring up to 10 mm in size. This finding may be related to  pneumonia.  OF NOTE: EGD in 2016: Benign-appearing esophageal stricture. Dilated.    Assessment / Plan / Recommendation  Clinical Impression   Pt seen for BSE today. Pt awake/alert; verbal and A/O x3. She was chewing gum upon entering room; talking w/ Daughter present in room; min HOH. Pt exhibited mild cough/throat clearing (of phlegm) at rest prior to po's. Pt and Dtr denied any "trouble swallowing like this before"; pt endorsed "some tougher foods hanging until I get them swallowed down". OF NOTE: EGD in 2016: "Benign-appearing esophageal stricture. Dilated.". Suspect pt could have some Esophageal dysmotility at her age; history.   Pt appears to present w/ adequate oropharyngeal phase swallow function w/ No oropharyngeal phase dysphagia noted, No  neuromuscular deficits noted. Pt consumed po trials w/ No overt, clinical s/s of aspiration during po trials.  Pt appears at reduced risk for aspiration when following general aspiration precautions.   During po trials, pt consumed all consistencies w/ no overt coughing, decline in vocal quality, or change in respiratory presentation during/post trials. O2 sats remained 100%; on [place] O2. Oral phase appeared [facility] w/ timely bolus management,  mastication, and control of bolus propulsion for A-P transfer for swallowing. Oral clearing achieved w/ all trial consistencies. Pt consumed Salad w/ her lunch meal w/ no difficulty. OM Exam appeared [facility] w/ no unilateral weakness noted. Speech Clear. Pt fed self w/ setup support eating in bed -- recommended sitting more (fully) upright for safer oral intake and adjusted pt in bed.   Recommend continue a fairly Regular consistency diet w/ well-Cut meats, moistened foods; Thin liquids VIA CUP - less straw use. Recommend general aspiration precautions including REDUCING DISTRACTIONS AND TALKING at meals. Pills WHOLE in Puree for safer, easier swallowing as pt described Larger pills can be difficult to swallow.  Extensive Education given on Pills in Puree; food consistencies and easy to eat options; general aspiration precautions. NSG to reconsult if any new needs arise. MD updated, agreed. Pt/Dtr agreed. SLP Visit Diagnosis: Dysphagia, unspecified (R13.10)    Aspiration Risk   (reduced when following general aspiration precautions)    Diet Recommendation   a fairly Regular consistency diet w/ well-Cut meats, moistened foods; Thin liquids VIA CUP - less straw use. Recommend general aspiration precautions including REDUCING DISTRACTIONS AND TALKING at meals.   Medication Administration: Whole meds with puree (for safer swallowing)    Other  Recommendations Recommended Consults: Consider GI evaluation;Consider esophageal assessment (if desired; Dietician f/u) Oral Care Recommendations: Oral care BID;Oral care before and after PO;Patient independent with oral care (setup) Other Recommendations:  (n/a)    Recommendations for follow up therapy are one component of a multi-disciplinary discharge planning process, led by the attending physician.  Recommendations may be updated based on patient status, additional functional criteria and insurance authorization.  Follow up Recommendations No SLP follow up       Assistance Recommended at Discharge None  Functional Status Assessment Patient has not had a recent decline in their functional status  Frequency and Duration  (n/a)   (n/a)       Prognosis Prognosis for Safe Diet Advancement: Good Barriers to Reach Goals: Time post onset;Severity of deficits Barriers/Prognosis Comment: Benign-appearing esophageal stricture. Dilated; monitor for any Esophageal dysmotility currently and f/u w/ GI      Swallow Study   General Date of Onset: 02/26/22 HPI: Pt is a pleasant 87 year old female with a history of COPD, hypertension, hoh, CKD, type 2 diabetes, hypothyroidism, and iron deficiency anemia who presents to the hospital after she aspirated a piece of shrimp while at a seafood restaurant.     Patient reports that she was eating shrimp at the [employer] when she felt like something got stuck in her throat followed by difficulty breathing and a sensation of chest/neck tightness.  On presentation to the ED, she was noted to be significantly hypoxic requiring oxygen supplementation.  ENT were consulted who performed fiberoptic evaluation of her airway and noted small piece of shrimp above the arytenoids that they were able to dislodge; swallowed by pt.  Following this, her oxygenation improved and she was transitioned to nasal cannula. CT scan of the chest was obtained and did not show aspirated material in her  airway but was suggestive of aspiration PNA.  CT of Chest: There are linear patchy infiltrates in right middle lobe and both  lower lobes, more so on the left side suggesting possible multifocal  atelectasis/pneumonia. Part of this finding may suggest underlying  scarring. There is no pleural effusion.     There are nodular densities in right middle lobe and right lower  lobe measuring up to 10 mm in size. This finding may be related to  pneumonia.  OF NOTE: EGD in 2016: Benign-appearing esophageal stricture. Dilated. Type of Study: Bedside  Swallow Evaluation Previous Swallow Assessment: none Diet Prior to this Study: Regular;Thin liquids Temperature Spikes Noted: No (wbc elevated) Respiratory Status: Nasal cannula (3-5L) History of Recent Intubation: No Behavior/Cognition: Pleasant mood;Cooperative;Alert (Dtr present) Oral Cavity Assessment: Within Functional Limits Oral Care Completed by SLP: Recent completion by staff Oral Cavity - Dentition: Adequate natural dentition Vision: Functional for self-feeding Self-Feeding Abilities: Able to feed self Patient Positioning: Upright in bed (needed support sitting more upright in bed for po intake) Baseline Vocal Quality: Normal Volitional Cough: Strong Volitional Swallow: Able to elicit    Oral/Motor/Sensory Function Overall Oral Motor/Sensory Function: Within functional limits   Ice Chips Ice chips: Not tested   Thin Liquid Thin Liquid: Within functional limits Presentation: Cup;Self Fed (8+) Other Comments: discussed not using straws    Nectar Thick Nectar Thick Liquid: Not tested   Honey Thick Honey Thick Liquid: Not tested   Puree Puree: Within functional limits Presentation: Spoon (3 trials)   Solid     Solid: Within functional limits Presentation: Self Fed;Spoon (10+) Other Comments: salad and meatloaf w/ lunch meal also         Orinda Kenner, MS, CCC-SLP Speech Language Pathologist Rehab Services; [place] [phone] (ascom) Robyn Butler 02/27/2022,1:41 PM

## 2022-02-27 NOTE — Assessment & Plan Note (Signed)
Continue gabapentin.

## 2022-02-27 NOTE — Progress Notes (Signed)
Progress Note   Patient: Robyn Butler:[id number] DOB: 1928-08-23 DOA: 02/26/2022     0 DOS: the patient was seen and examined on 02/27/2022   Brief hospital course: [name] is a 88 year old female with history of hypothyroid, hyperlipidemia, hypertension, CKD stage IIIa, COPD, former tobacco user quitting in 1993, who presents emergency department for chief concerns of aspiration of a shrimp.  Initial vitals in the emergency department showed temperature of 98.5, respiration rate of 26, blood pressure 174/86, heart rate 94, initial SPO2 of 59% on room air, patient was placed on 15 L nonrebreather with SPO2 improving to 91%.  Serum sodium is 140, potassium 4.0, chloride 106, bicarb 25, BUN of 25, serum creatinine of 1.10, GFR 48, nonfasting blood glucose 205, WBC 7.7, hemoglobin 11.3, platelets of 246.  Portable chest x-ray was read as no focal infiltrates.  No opaque foreign bodies.  Linear densities in left lower lung suggesting scarring.  Neck soft tissue x-ray: Read as no narrowing of the airway.  Epiglottis is unremarkable.  No radiopaque foreign bodies are seen.  CT of the chest without contrast: Was read as linear patchy infiltrates in the right middle lobe and both lower lobes, more so on the left side suggesting possible multifocal atelectasis/pneumonia.  There are nodular densities in the right middle lobe and right lower lobe measuring 10 mm in size.  May be related to pneumonia.  Recommend follow-up CT in 3 months.  No radiopaque foreign bodies are seen.  ED treatment: Unasyn 3 g IV.  EDP consulted ENT and pulmonologist.  Assessment and Plan: * Aspiration into airway With possible aspiration pneumonia versus multifocal pneumonia  Continue Unasyn per pharmacy  Aspiration precautions  Aspiration pneumonia [name] admitted to the hospital for aspiration pneumonia Leukocytosis persists but shows a downward trend Patient was initially scheduled to have a  bronchoscopy but discussed with pulmonology who recommends IV antibiotic therapy at this time with plans to do a bronchoscopy if there is a change in patient's condition. Patient was seen and evaluated by speech therapy and has no evidence of dysphagia. Continue IV Unasyn to complete a 5 to 7-day course of therapy  Acute respiratory failure with hypoxia (HCC) Multifactorial including aspiration pneumonia and foreign body aspiration in setting of COPD Patient was initially on 7 L of oxygen but has been weaned down to 4 L and is maintaining pulse oximetry greater than 92% Continue to wean off oxygen as tolerated   Diabetes mellitus type 2, noninsulin dependent (HCC) Blood sugars are stable Continue sliding scale coverage and blood sugar checks before meals and at bedtime   Stage 3a chronic kidney disease (CKD) (HCC) Renal function appears to be at baseline  History of tobacco use Smoking cessation has been discussed with patient in detail  Adult hypothyroidism Continue Synthroid  Essential (primary) hypertension Continue amlodipine As needed hydralazine for systolic blood pressure greater than 132mHg  HLD (hyperlipidemia) Continue Pravastatin 40 mg daily   Esophageal reflux Continue PPI   Diabetic neuropathy (HCC) Continue gabapentin  COPD (chronic obstructive pulmonary disease) (HCC) Stable and not acutely exacerbated Continue as needed bronchodilator therapy    Anemia due to stage 3a chronic kidney disease (HCC) H&H is stable. Monitor closely during this hospitalization        Subjective: Patient is seen and examined at bedside.  Ate breakfast this morning even though she was supposed to be n.p.o. and denies having any difficulty swallowing.  Physical Exam: Vitals:   02/27/22 0833 02/27/22  1242 02/27/22 1330 02/27/22 1358  BP: (!) 135/50 (!) 135/45 (!) 144/62 136/61  Pulse: 72 80 77 81  Resp: 12 (!) '25 15 16  '$ Temp: 98.2 F (36.8 C) 98.4 F (36.9 C)  98.4  F (36.9 C)  TempSrc: Oral Oral  Oral  SpO2: 98% 97% 97% 100%  Weight:      Height:       Physical Exam Vitals and nursing note reviewed.  Constitutional:      Appearance: Normal appearance.  HENT:     Head: Normocephalic and atraumatic.     Nose: Nose normal.     Mouth/Throat:     Mouth: Mucous membranes are moist.  Eyes:     Pupils: Pupils are equal, round, and reactive to light.  Cardiovascular:     Rate and Rhythm: Normal rate and regular rhythm.  Pulmonary:     Effort: Pulmonary effort is normal.     Breath sounds: Normal breath sounds.     Comments: Scattered expiratory Abdominal:     General: Abdomen is flat. Bowel sounds are normal.     Palpations: Abdomen is soft.  Musculoskeletal:        General: Normal range of motion.     Cervical back: Normal range of motion and neck supple.  Skin:    General: Skin is warm and dry.  Neurological:     General: No focal deficit present.     Mental Status: She is alert.  Psychiatric:        Mood and Affect: Mood normal.        Behavior: Behavior normal.     Data Reviewed: Relevant notes from primary care and specialist visits, past discharge summaries as available in EHR, including Care Everywhere. Prior diagnostic testing as pertinent to current admission diagnoses Updated medications and problem lists for reconciliation ED course, including vitals, labs, imaging, treatment and response to treatment Triage notes, nursing and pharmacy notes and ED provider's notes Notable results as noted in HPI Labs reviewed.  White count 19.1 down from 21.4, hemoglobin 8.5 down from 11.  Renal function is stable There are no new results to review at this time.  Family Communication: Greater than 50% of time was spent discussing patient's condition and plan of care with her and her daughter at the bedside.  All questions and concerns have been addressed.  Disposition: Status is: Inpatient Remains inpatient appropriate because: She  continues to require high flow oxygen to maintain pulse oximetry greater than 92% and is on IV antibiotics  Planned Discharge Destination: Home    Time spent: 35 minutes  Author: Collier Bullock, MD 02/27/2022 3:17 PM  For on call review [email or web address].

## 2022-02-27 NOTE — Assessment & Plan Note (Signed)
Smoking cessation  

## 2022-02-27 NOTE — Assessment & Plan Note (Signed)
Hemoglobin 10, with a ferritin of 305.

## 2022-02-27 NOTE — Assessment & Plan Note (Addendum)
IV Unasyn switched over to Augmentin on 03/01/2022.

## 2022-02-27 NOTE — Assessment & Plan Note (Addendum)
Stable and not acutely exacerbated Continue as needed bronchodilator therapy Continue Spiriva

## 2022-02-28 DIAGNOSIS — T17908D Unspecified foreign body in respiratory tract, part unspecified causing other injury, subsequent encounter: Secondary | ICD-10-CM

## 2022-02-28 LAB — BASIC METABOLIC PANEL
Anion gap: 7 (ref 5–15)
BUN: 28 mg/dL — ABNORMAL HIGH (ref 8–23)
CO2: 27 mmol/L (ref 22–32)
Calcium: 8.7 mg/dL — ABNORMAL LOW (ref 8.9–10.3)
Chloride: 106 mmol/L (ref 98–111)
Creatinine, Ser: 1.28 mg/dL — ABNORMAL HIGH (ref 0.44–1.00)
GFR, Estimated: 40 mL/min — ABNORMAL LOW (ref >60)
Glucose, Bld: 130 mg/dL — ABNORMAL HIGH (ref 70–99)
Potassium: 3.9 mmol/L (ref 3.5–5.1)
Sodium: 140 mmol/L (ref 135–145)

## 2022-02-28 LAB — GLUCOSE, CAPILLARY
Glucose-Capillary: 115 mg/dL — ABNORMAL HIGH (ref 70–99)
Glucose-Capillary: 149 mg/dL — ABNORMAL HIGH (ref 70–99)
Glucose-Capillary: 118 mg/dL — ABNORMAL HIGH (ref 70–99)
Glucose-Capillary: 170 mg/dL — ABNORMAL HIGH (ref 70–99)

## 2022-02-28 LAB — CBC
HCT: 33.4 % — ABNORMAL LOW (ref 36.0–46.0)
Hemoglobin: 10 g/dL — ABNORMAL LOW (ref 12.0–15.0)
MCH: 30 pg (ref 26.0–34.0)
MCHC: 29.9 g/dL — ABNORMAL LOW (ref 30.0–36.0)
MCV: 100.3 fL — ABNORMAL HIGH (ref 80.0–100.0)
Platelets: 215 10*3/uL (ref 150–400)
RBC: 3.33 MIL/uL — ABNORMAL LOW (ref 3.87–5.11)
RDW: 13.7 % (ref 11.5–15.5)
WBC: 9.7 10*3/uL (ref 4.0–10.5)
nRBC: 0 % (ref 0.0–0.2)

## 2022-02-28 MED ORDER — SODIUM CHLORIDE 0.9 % IV SOLN: INTRAVENOUS | Status: AC

## 2022-02-28 NOTE — TOC Initial Note (Signed)
Transition of Care [facility]) - Initial/Assessment Note    Patient Details  Name: Robyn Butler MRN: [id number] Date of Birth: 28-Aug-1928  Transition of Care [facility]) CM/SW Contact:    Pete Pelt, RN Phone Number: 02/28/2022, 5:08 PM  Clinical Narrative:     RNCM in to see patient, daughter at bedside.  Patient lives at home alone, daughter assists with care and transportation.  Patient does not currently have home services, as she does not feel they are required.  Patient has no further follow up from PT needed.  Patient and daughter are aware of possibility of home oxygen, MD to reassess tomorrow to confirm needs.  Patient and daughter decline other TOC needs at this time.               Expected Discharge Plan: Home/Self Care Barriers to Discharge: Continued Medical Work up   Patient Goals and CMS Choice        Expected Discharge Plan and Services Expected Discharge Plan: Home/Self Care   Discharge Planning Services: CM Consult Post Acute Care Choice: Durable Medical Equipment Living arrangements for the past 2 months: Single Family Home                                      Prior Living Arrangements/Services Living arrangements for the past 2 months: Single Family Home Lives with:: Self Patient language and need for interpreter reviewed:: Yes        Need for Family Participation in Patient Care: Yes (Comment) Care giver support system in place?: Yes (comment) Current home services:  (no current home services) Criminal Activity/Legal Involvement Pertinent to Current Situation/Hospitalization: No - Comment as needed  Activities of Daily Living Home Assistive Devices/Equipment: Cane (specify quad or straight), Walker (specify type) ADL Screening (condition at time of admission) Patient's cognitive ability adequate to safely complete daily activities?: Yes Is the patient deaf or have difficulty hearing?: Yes Does the patient have difficulty seeing, even when  wearing glasses/contacts?: Yes Does the patient have difficulty concentrating, remembering, or making decisions?: No Patient able to express need for assistance with ADLs?: Yes Does the patient have difficulty dressing or bathing?: No Independently performs ADLs?: Yes (appropriate for developmental age) Does the patient have difficulty walking or climbing stairs?: Yes Weakness of Legs: Both Weakness of Arms/Hands: Both  Permission Sought/Granted Permission sought to share information with : Case Manager Permission granted to share information with : Yes, Verbal Permission Granted     Permission granted to share info w AGENCY: [employer] if needed        Emotional Assessment Appearance:: Appears stated age (very hard of hearing) Attitude/Demeanor/Rapport: Gracious, Engaged Affect (typically observed): Pleasant, Appropriate Orientation: : Oriented to Self, Oriented to Place, Oriented to  Time, Oriented to Situation Alcohol / Substance Use: Not Applicable Psych Involvement: No (comment)  Admission diagnosis:  Aspiration pneumonia ([place]) [J69.0] Acute respiratory failure with hypoxia (HCC) [J96.01] Aspiration into airway [T17.908A] Aspiration pneumonia, unspecified aspiration pneumonia type, unspecified laterality, unspecified part of lung ([place]) [J69.0] Patient Active Problem List   Diagnosis Date Noted   Aspiration into airway 02/26/2022   Stage 3a chronic kidney disease (CKD) ([place]) 02/26/2022   Diabetes mellitus type 2, noninsulin dependent ([place]) 02/26/2022   Acute respiratory failure with hypoxia ([place]) 02/26/2022   Aspiration pneumonia ([place])    Pressure injury of skin 08/04/2021   Closed right hip fracture ([place]) 08/03/2021  Diabetes mellitus without complication ([place])    Periprosthetic fracture around internal prosthetic right hip joint ([place])    Preoperative clearance    Anemia of chronic renal failure, stage 3 (moderate) (HCC) 09/26/2019   Osteoarthritis of hip 10/03/2016    Esophageal stricture 06/27/2016   History of adenomatous polyp of colon 06/16/2015   Dizziness 04/28/2015   Allergic rhinitis 01/27/2015   Absolute anemia 01/27/2015   Cataract 01/27/2015   Anemia due to stage 3a chronic kidney disease ([place]) 01/27/2015   Colon, diverticulosis 01/27/2015   Accumulation of fluid in tissues 01/27/2015   Esophageal reflux 01/27/2015   Glaucoma 01/27/2015   Hemorrhoid 01/27/2015   High potassium 01/27/2015   Hypomagnesemia 01/27/2015   Embedded toenail 01/27/2015   Microalbuminuria 01/27/2015   Neuropathy 01/27/2015   Neuralgia neuritis, sciatic nerve 01/27/2015   Skin lesion 01/27/2015   Cardiac enlargement 02/16/2014   OP (osteoporosis) 08/01/2012   Personal history of traumatic fracture 08/02/2011   Arthropathy of pelvic region and thigh 07/07/2009   Leg varices 01/02/2006   Diverticulitis of colon 01/20/2004   Barrett esophagus 10/26/2003   COPD (chronic obstructive pulmonary disease) ([place]) 04/16/2003   Aortic valve disorder 04/16/2003   Adult hypothyroidism 07/30/2002   Female genuine stress incontinence 02/07/2002   HLD (hyperlipidemia) 02/07/2002   Asthma 08/02/2001   Diabetic neuropathy ([place]) 08/27/2000   Essential (primary) hypertension 08/27/2000   H/O malignant neoplasm of breast 07/17/1997   History of tobacco use 07/17/1988   H/O total hysterectomy 07/17/1970   PCP:  Kirk Ruths, MD Pharmacy:   [place], [place] [address] Phone: [phone] Fax: [place], [place] [address] [place] [address] Phone: [phone] Fax: [place], [place] - [place] [address] Phone: [phone] Fax: [phone]  [id number] [address], [place] [address] [place] [address] Phone: [phone] Fax: [facility] [address] [place] [address] Phone: [phone] Fax: [phone]     Social Determinants of Health (SDOH) Interventions    Readmission Risk Interventions     No data to display

## 2022-02-28 NOTE — Progress Notes (Signed)
Progress Note   Patient: Robyn Butler OJJ:[id number] DOB: 1928-08-27 DOA: 02/26/2022     1 DOS: the patient was seen and examined on 02/28/2022   Brief hospital course: [name] is a 88 year old female with history of hypothyroid, hyperlipidemia, hypertension, CKD stage IIIa, COPD, former tobacco user quitting in 1993, who presents emergency department for chief concerns of aspiration of a shrimp.  Initial vitals in the emergency department showed temperature of 98.5, respiration rate of 26, blood pressure 174/86, heart rate 94, initial SPO2 of 59% on room air, patient was placed on 15 L nonrebreather with SPO2 improving to 91%.  Serum sodium is 140, potassium 4.0, chloride 106, bicarb 25, BUN of 25, serum creatinine of 1.10, GFR 48, nonfasting blood glucose 205, WBC 7.7, hemoglobin 11.3, platelets of 246.  Portable chest x-ray was read as no focal infiltrates.  No opaque foreign bodies.  Linear densities in left lower lung suggesting scarring.  Neck soft tissue x-ray: Read as no narrowing of the airway.  Epiglottis is unremarkable.  No radiopaque foreign bodies are seen.  CT of the chest without contrast: Was read as linear patchy infiltrates in the right middle lobe and both lower lobes, more so on the left side suggesting possible multifocal atelectasis/pneumonia.  There are nodular densities in the right middle lobe and right lower lobe measuring 10 mm in size.  May be related to pneumonia.  Recommend follow-up CT in 3 months.  No radiopaque foreign bodies are seen.  ED treatment: Unasyn 3 g IV.  EDP consulted ENT and pulmonologist.  Assessment and Plan: * Aspiration into airway With possible aspiration pneumonia versus multifocal pneumonia  Continue Unasyn per pharmacy  Aspiration precautions  Aspiration pneumonia Robyn Butler) Patient admitted to the hospital for aspiration pneumonia Patient with marked leukocytosis on admission which has normalized. Patient was initially scheduled  to have a bronchoscopy but discussed with pulmonology who recommends IV antibiotic therapy at this time with plans to do a bronchoscopy if there is a change in patient's condition. Patient was seen and evaluated by speech therapy and has no evidence of dysphagia. Continue IV Unasyn to complete a 5 to 7-day course of therapy  Acute respiratory failure with hypoxia (HCC) Multifactorial including aspiration pneumonia and foreign body aspiration in setting of COPD Patient was initially on 7 L of oxygen but has been weaned down to 4 L and is maintaining pulse oximetry greater than 92%. Unable to wean patient down from 4L aspulse oximetry remains at 92% Continue to wean off oxygen as tolerated Will need to assess for oxygen need prior to discharge   Diabetes mellitus type 2, noninsulin dependent (HCC) Blood sugars are stable Continue sliding scale coverage and blood sugar checks before meals and at bedtime. Maintain consistent carb diet   Stage 3a chronic kidney disease (CKD) ([place]) Noted to have slight worsening in her renal function. Place hydrochlorothiazide/irbesartan on hold for now Gentle IVF hydration Repeat renal parameters in am  History of tobacco use Smoking cessation has been discussed with patient in detail  Adult hypothyroidism Continue Synthroid  Essential (primary) hypertension Continue amlodipine As needed hydralazine for systolic blood pressure greater than 132mHg  HLD (hyperlipidemia) Continue Pravastatin 40 mg daily   Esophageal reflux Continue PPI   Diabetic neuropathy (HCC) Continue gabapentin  COPD (chronic obstructive pulmonary disease) (HCC) Stable and not acutely exacerbated Continue as needed bronchodilator therapy Continue Spiriva    Anemia due to stage 3a chronic kidney disease (HCC) H&H is stable. Monitor closely  during this hospitalization        Subjective: Patient is seen and examined at bedside.  Sitting up in a chair requesting  to be discharged home.  Physical Exam: Vitals:   02/27/22 2343 02/27/22 2344 02/28/22 0621 02/28/22 0755  BP: (!) 127/37 (!) 152/64 (!) 145/50 (!) 140/53  Pulse: 75 78 86 84  Resp: '16  16 16  '$ Temp: 98.9 F (37.2 C)  98.1 F (36.7 C) 98.8 F (37.1 C)  TempSrc:      SpO2: 99%  (!) 83% 92%  Weight:      Height:       Vitals and nursing note reviewed.  Constitutional:      Appearance: Normal appearance.  HENT:     Head: Normocephalic and atraumatic.     Nose: Nose normal.     Mouth/Throat:     Mouth: Mucous membranes are moist.  Eyes:     Pupils: Pupils are equal, round, and reactive to light.  Cardiovascular:     Rate and Rhythm: Normal rate and regular rhythm.  Pulmonary:     Effort: Pulmonary effort is normal.     Breath sounds: Normal breath sounds.  Abdominal:     General: Abdomen is flat. Bowel sounds are normal.     Palpations: Abdomen is soft.  Musculoskeletal:        General: Normal range of motion.     Cervical back: Normal range of motion and neck supple.  Skin:    General: Skin is warm and dry.  Neurological:     General: No focal deficit present.     Mental Status: She is alert.  Psychiatric:        Mood and Affect: Mood normal.        Behavior: Behavior normal.       Data Reviewed: Relevant notes from primary care and specialist visits, past discharge summaries as available in EHR, including Care Everywhere. Prior diagnostic testing as pertinent to current admission diagnoses Updated medications and problem lists for reconciliation ED course, including vitals, labs, imaging, treatment and response to treatment Triage notes, nursing and pharmacy notes and ED provider's notes Notable results as noted in HPI Labs reviewed.  Patient noted to have slight worsening of her renal function There are no new results to review at this time.  Family Communication: Greater than 50% of time was spent discussing plan of care with patient at the bedside.  She  verbalizes understanding and agrees with the plan.  Disposition: Status is: Inpatient Remains inpatient appropriate because: Patient continues to have a high oxygen requirement and is on 4 L with pulse oximetry of 92%.  Planned Discharge Destination: Home    Time spent: 33 minutes  Author: Collier Bullock, MD 02/28/2022 10:08 AM  For on call review [email or web address].

## 2022-02-28 NOTE — Progress Notes (Signed)
Oxygen Qualification Note   SaO2 on room air at rest = 92% SaO2 on room air while ambulating = 86% SaO2 on 2 liters of O2 while ambulating = 92%  Lieutenant Diego PT, DPT 10:30 AM,02/28/22

## 2022-02-28 NOTE — Evaluation (Signed)
Physical Therapy Evaluation Patient Details Name: Robyn Butler MRN: [id number] DOB: 1929/06/20 Today's Date: 02/28/2022  History of Present Illness  Pt is a 88 year old female with history of hypothyroid, hyperlipidemia, hypertension, CKD stage IIIa, COPD, former tobacco user quitting in 1993, who presents emergency department for chief concerns of aspiration of a shrimp.   Clinical Impression  Patient alert, oriented x4, agreeable to PT, denied pain. Pt stated at baseline she lives alone, ModI-I for ADLs, does a little bit of furniture walking at home, and daughter does the driving. Goes once a week with her to the grocery store.   The patient performed transfers with supervision and RW. She was able to ambulate at least 180f with supervision and RW as well. PT and pt discussed potential use of RW at discharge for at least a week to ensure safety in home (potentially going home on O2 and will have O2 tubing to avoid tripping on). spO2 on room air at rest 92%, but pt did need 2L via [place] to keep spO2 >90% with mobility, RN notified. The patient demonstrated and reported near return to baseline level of functioning, no further acute PT needs indicated. PT to sign off. Please reconsult PT if pt status changes or acute needs are identified.         Recommendations for follow up therapy are one component of a multi-disciplinary discharge planning process, led by the attending physician.  Recommendations may be updated based on patient status, additional functional criteria and insurance authorization.  Follow Up Recommendations No PT follow up      Assistance Recommended at Discharge PRN  Patient can return home with the following  Assist for transportation    Equipment Recommendations None recommended by PT  Recommendations for Other Services       Functional Status Assessment Patient has not had a recent decline in their functional status     Precautions / Restrictions  Precautions Precautions: Fall Restrictions Weight Bearing Restrictions: No      Mobility  Bed Mobility               General bed mobility comments: pt up in chair at start/end of session    Transfers Overall transfer level: Needs assistance Equipment used: Rolling walker (2 wheels) Transfers: Sit to/from Stand Sit to Stand: Supervision                Ambulation/Gait Ambulation/Gait assistance: Supervision Gait Distance (Feet): 170 Feet Assistive device: Rolling walker (2 wheels)         General Gait Details: trialed on room air, desaturation to 86%, placed on 2L. no LOB or major gait deviations noted.  Stairs            Wheelchair Mobility    Modified Rankin (Stroke Patients Only)       Balance Overall balance assessment: Needs assistance Sitting-balance support: Feet supported Sitting balance-Leahy Scale: Normal       Standing balance-Leahy Scale: Good Standing balance comment: agreeable to RW, pt states she does a bit of furniture walking at baseline                             Pertinent Vitals/Pain Pain Assessment Pain Assessment: No/denies pain    Home Living Family/patient expects to be discharged to:: Private residence Living Arrangements: Alone Available Help at Discharge: Family;Available PRN/intermittently (daughter checks in weekly) Type of Home: Apartment Home Access: Level entry  Home Layout: One level Home Equipment: Rolling Walker (2 wheels);Grab bars - toilet;Grab bars - tub/shower;Cane - single point      Prior Function Prior Level of Function : Independent/Modified Independent (daughter does the driving now)                     Hand Dominance   Dominant Hand: Right    Extremity/Trunk Assessment   Upper Extremity Assessment Upper Extremity Assessment: Overall WFL for tasks assessed    Lower Extremity Assessment Lower Extremity Assessment: Overall WFL for tasks assessed        Communication   Communication: HOH  Cognition Arousal/Alertness: Awake/alert Behavior During Therapy: WFL for tasks assessed/performed Overall Cognitive Status: Within Functional Limits for tasks assessed                                          General Comments      Exercises     Assessment/Plan    PT Assessment Patient needs continued PT services;Patient does not need any further PT services  PT Problem List Decreased activity tolerance       PT Treatment Interventions      PT Goals (Current goals can be found in the Care Plan section)       Frequency       Co-evaluation               AM-PAC PT "6 Clicks" Mobility  Outcome Measure Help needed turning from your back to your side while in a flat bed without using bedrails?: None Help needed moving from lying on your back to sitting on the side of a flat bed without using bedrails?: None Help needed moving to and from a bed to a chair (including a wheelchair)?: None Help needed standing up from a chair using your arms (e.g., wheelchair or bedside chair)?: None Help needed to walk in hospital room?: None Help needed climbing 3-5 steps with a railing? : A Little 6 Click Score: 23    End of Session Equipment Utilized During Treatment: Gait belt;Oxygen (4L-2L) Activity Tolerance: Patient tolerated treatment well Patient left: in chair;with call bell/phone within reach Nurse Communication: Mobility status PT Visit Diagnosis: Other abnormalities of gait and mobility (R26.89)    Time: 6203-5597 PT Time Calculation (min) (ACUTE ONLY): 20 min   Charges:   PT Evaluation $PT Eval Low Complexity: 1 Low PT Treatments $Therapeutic Activity: 8-22 mins       Lieutenant Diego PT, DPT 12:00 PM,02/28/22

## 2022-03-01 DIAGNOSIS — J690 Pneumonitis due to inhalation of food and vomit: Secondary | ICD-10-CM | POA: Diagnosis not present

## 2022-03-01 DIAGNOSIS — J9601 Acute respiratory failure with hypoxia: Secondary | ICD-10-CM | POA: Diagnosis not present

## 2022-03-01 DIAGNOSIS — E1169 Type 2 diabetes mellitus with other specified complication: Secondary | ICD-10-CM | POA: Diagnosis not present

## 2022-03-01 DIAGNOSIS — N1831 Chronic kidney disease, stage 3a: Secondary | ICD-10-CM | POA: Diagnosis not present

## 2022-03-01 LAB — BLOOD GAS, VENOUS
Acid-Base Excess: 1.9 mmol/L (ref 0.0–2.0)
Bicarbonate: 27.7 mmol/L (ref 20.0–28.0)
Drawn by: 51910
O2 Saturation: 58.1 %
Patient temperature: 37
pCO2, Ven: 48 mmHg (ref 44–60)
pH, Ven: 7.37 (ref 7.25–7.43)
pO2, Ven: 35 mmHg (ref 32–45)

## 2022-03-01 LAB — GLUCOSE, CAPILLARY
Glucose-Capillary: 137 mg/dL — ABNORMAL HIGH (ref 70–99)
Glucose-Capillary: 176 mg/dL — ABNORMAL HIGH (ref 70–99)
Glucose-Capillary: 119 mg/dL — ABNORMAL HIGH (ref 70–99)
Glucose-Capillary: 148 mg/dL — ABNORMAL HIGH (ref 70–99)

## 2022-03-01 MED ORDER — ALBUTEROL SULFATE (2.5 MG/3ML) 0.083% IN NEBU
2.5000 mg | INHALATION_SOLUTION | Freq: Three times a day (TID) | RESPIRATORY_TRACT | Status: DC
  Administered 2022-03-01 – 2022-03-02 (×2): 2.5 mg via RESPIRATORY_TRACT
  Filled 2022-03-01 (×3): qty 3

## 2022-03-01 MED ORDER — AMOXICILLIN-POT CLAVULANATE 500-125 MG PO TABS
1.0000 | ORAL_TABLET | Freq: Two times a day (BID) | ORAL | Status: DC
  Administered 2022-03-01 – 2022-03-02 (×3): 500 mg via ORAL
  Filled 2022-03-01 (×3): qty 1

## 2022-03-01 NOTE — Progress Notes (Signed)
Progress Note   Patient: Robyn Butler OVF:[id number] DOB: 09-27-1928 DOA: 02/26/2022     2 DOS: the patient was seen and examined on 03/01/2022   Brief hospital course: Ms. Cambry Spampinato is a 88 year old female with history of hypothyroid, hyperlipidemia, hypertension, CKD stage IIIa, COPD, former tobacco user quitting in 1993, who presents emergency department for chief concerns of aspiration of a shrimp.  Initial vitals in the emergency department showed temperature of 98.5, respiration rate of 26, blood pressure 174/86, heart rate 94, initial SPO2 of 59% on room air, patient was placed on 15 L nonrebreather with SPO2 improving to 91%.  Serum sodium is 140, potassium 4.0, chloride 106, bicarb 25, BUN of 25, serum creatinine of 1.10, GFR 48, nonfasting blood glucose 205, WBC 7.7, hemoglobin 11.3, platelets of 246.  Portable chest x-ray was read as no focal infiltrates.  No opaque foreign bodies.  Linear densities in left lower lung suggesting scarring.  Neck soft tissue x-ray: Read as no narrowing of the airway.  Epiglottis is unremarkable.  No radiopaque foreign bodies are seen.  CT of the chest without contrast: Was read as linear patchy infiltrates in the right middle lobe and both lower lobes, more so on the left side suggesting possible multifocal atelectasis/pneumonia.  There are nodular densities in the right middle lobe and right lower lobe measuring 10 mm in size.  May be related to pneumonia.  Recommend follow-up CT in 3 months.  No radiopaque foreign bodies are seen.  ED treatment: Unasyn 3 g IV.  EDP consulted ENT and pulmonologist.  Assessment and Plan: * Acute respiratory failure with hypoxia ([place]) During the hospital course was up as high as 7 L of oxygen.  This morning was on 1-1/2 L and I tried to take her off oxygen.  Upon walking around twice today with staff her pulse ox dropped down into the 80s.  We will try again tomorrow.  May end up needing home oxygen.   Aspiration  into airway ENT did a laryngoscope and pushed a small piece of shrimp scale downward where she was able to swallow it.  IV Unasyn given during the hospital course will be switched over to Augmentin.  Aspiration pneumonia ([place]) IV Unasyn switched over to Augmentin on 03/01/2022.  Type 2 diabetes mellitus with hyperlipidemia (HCC) Continue pravastatin Hemoglobin A1c actually low at 5.4 I will discontinue sliding scale and fingersticks.  Stage 3a chronic kidney disease (CKD) (HCC) last creatinine 1.28 with a GFR 40  History of tobacco use Smoking cessation  Adult hypothyroidism Continue Synthroid  Essential (primary) hypertension Continue amlodipine   Esophageal reflux Continue PPI   Diabetic neuropathy (HCC) Continue gabapentin  COPD (chronic obstructive pulmonary disease) (HCC) Stable and not acutely exacerbated Continue as needed bronchodilator therapy Continue Spiriva    Anemia due to stage 3a chronic kidney disease (HCC) Hemoglobin 10, with a ferritin of 305.        Subjective: Patient feeling better.  No further episodes of choking on food.  Advised to chew her food completely.  Admitted with aspiration pneumonia.  Unable to come off oxygen today desaturating with ambulation.  Physical Exam: Vitals:   03/01/22 1511 03/01/22 1546 03/01/22 1550 03/01/22 1600  BP:      Pulse:      Resp:      Temp:      TempSrc:      SpO2: 94% (!) 81% 94% 94%  Weight:      Height:  Physical Exam HENT:     Head: Normocephalic.     Mouth/Throat:     Pharynx: No oropharyngeal exudate.  Eyes:     General: Lids are normal.     Conjunctiva/sclera: Conjunctivae normal.  Cardiovascular:     Rate and Rhythm: Normal rate and regular rhythm.     Heart sounds: Normal heart sounds, S1 normal and S2 normal.  Pulmonary:     Breath sounds: Examination of the right-lower field reveals decreased breath sounds. Examination of the left-lower field reveals decreased breath sounds.  Decreased breath sounds present. No wheezing, rhonchi or rales.  Abdominal:     Palpations: Abdomen is soft.     Tenderness: There is no abdominal tenderness.  Musculoskeletal:     Right lower leg: No swelling.     Left lower leg: No swelling.  Skin:    General: Skin is warm.     Findings: No rash.  Neurological:     Mental Status: She is alert and oriented to person, place, and time.     Data Reviewed: Creatinine 1.28 with a GFR 40, hemoglobin 10 Linear impaction infiltrates right middle lobe and both lower lobes consistent with multifocal atelectasis versus pneumonia, also nodular densities right middle lobe and right lower lobe measuring up to 10 mm in size may repeat related to pneumonia.  Family Communication: Updated patient's daughter on the phone  Disposition: Status is: Inpatient Remains inpatient appropriate because: Patient dropped her oxygen saturations with ambulation.  She does not wear oxygen at home and trying to see if we can get her off oxygen.  Planned Discharge Destination: Home    Time spent:  28 minutes  Author: Loletha Grayer, MD 03/01/2022 4:47 PM  For on call review [email or web address].

## 2022-03-01 NOTE — Progress Notes (Signed)
SATURATION QUALIFICATIONS: (This note is used to comply with regulatory documentation for home oxygen)  Patient Saturations on Room Air at Rest = 94%  Patient Saturations on Room Air while Ambulating = 81%  Patient Saturations on 2 Liters of oxygen while Ambulating = 94%  Please briefly explain why patient needs home oxygen:

## 2022-03-01 NOTE — Progress Notes (Signed)
Mobility Specialist - Progress Note   Pre-mobility (pule ox): SpO2 94% During mobility(pulse ox): SpO2 84% During mobility(pulse ox): SpO2 90% Post-mobility( pulse ox): SPO2 91%  During mobility (dino map): SpO2 82% During mobility (dino map): SpO2 91% Post- mobility (dino map): SpO2 90%    03/01/22 1208  Mobility  Activity Ambulated with assistance in hallway  Level of Assistance Contact guard assist, steadying assist  Assistive Device Front wheel walker  Distance Ambulated (ft) 360 ft  Activity Response Tolerated well  $Mobility charge 1 Mobility   Pt supine upon entry. Pt utilizing [name] 1L. Pt transferred to EOB and stood with contact guard min assist. Pt ambulated two laps around NS. During the first lap around 80 ft Pt SpO2 dropped to 84%, Pt bumped back to 1L [name] and SpO2 elevated to 90%. During the second lap around 40 ft Pt SpO2 dropped to 82% with pleth showing normal heart function, Pt bumped back to 1L [name] with SpO2 elevating to 91%. Pt denied feeling lightlessness, nausea and fatigue during the duration of the session. Pt left in chair with needs in reach. No complaints.   Candie Mile Mobility Specialist 03/01/22 12:23 PM

## 2022-03-02 DIAGNOSIS — T17908D Unspecified foreign body in respiratory tract, part unspecified causing other injury, subsequent encounter: Secondary | ICD-10-CM | POA: Diagnosis not present

## 2022-03-02 DIAGNOSIS — J9621 Acute and chronic respiratory failure with hypoxia: Secondary | ICD-10-CM | POA: Diagnosis not present

## 2022-03-02 DIAGNOSIS — E1169 Type 2 diabetes mellitus with other specified complication: Secondary | ICD-10-CM | POA: Diagnosis not present

## 2022-03-02 DIAGNOSIS — J690 Pneumonitis due to inhalation of food and vomit: Secondary | ICD-10-CM | POA: Diagnosis not present

## 2022-03-02 MED ORDER — ALBUTEROL SULFATE (2.5 MG/3ML) 0.083% IN NEBU
2.5000 mg | INHALATION_SOLUTION | Freq: Four times a day (QID) | RESPIRATORY_TRACT | Status: DC | PRN
Start: 2022-03-02 — End: 2022-03-02

## 2022-03-02 MED ORDER — ALBUTEROL SULFATE HFA 108 (90 BASE) MCG/ACT IN AERS: 2.0000 | INHALATION_SPRAY | Freq: Four times a day (QID) | RESPIRATORY_TRACT | 0 refills | Status: DC | PRN

## 2022-03-02 MED ORDER — IRBESARTAN 150 MG PO TABS
150.0000 mg | ORAL_TABLET | Freq: Every day | ORAL | Status: DC
  Administered 2022-03-02: 150 mg via ORAL
  Filled 2022-03-02: qty 1

## 2022-03-02 MED ORDER — AMOXICILLIN-POT CLAVULANATE 500-125 MG PO TABS: 1.0000 | ORAL_TABLET | Freq: Two times a day (BID) | ORAL | 0 refills | Status: DC

## 2022-03-02 MED ORDER — HYDROCHLOROTHIAZIDE 25 MG PO TABS
25.0000 mg | ORAL_TABLET | Freq: Every day | ORAL | Status: DC
  Administered 2022-03-02: 25 mg via ORAL
  Filled 2022-03-02: qty 1

## 2022-03-02 NOTE — TOC Transition Note (Signed)
Transition of Care [facility]) - CM/SW Discharge Note   Patient Details  Name: Robyn Butler MRN: [id number] Date of Birth: 16-Dec-1928  [facility] CM/SW Contact:  Alberteen Sam, LCSW Phone Number: 03/02/2022, 9:59 AM   Clinical Narrative:     Patient to discharge today home with 2L O2, CSW has ordered O2 through [employer] to be delivered at bedside shortly, prior to dc. Patient's daughter aware and will pick patient up this afternoon. No further discharge needs identified at this time.    Final next level of care: Home/Self Care Barriers to Discharge: No Barriers Identified   Patient Goals and CMS Choice Patient states their goals for this hospitalization and ongoing recovery are:: to go home CMS Medicare.gov Compare Post Acute Care list provided to:: Patient Choice offered to / list presented to : Patient  Discharge Placement                       Discharge Plan and Services   Discharge Planning Services: CM Consult Post Acute Care Choice: Durable Medical Equipment          DME Arranged: Oxygen DME Agency: [employer] Date DME Agency Contacted: 03/02/22 Time DME Agency Contacted: [phone] Representative spoke with at DME Agency: [place] ([place]) Interventions     Readmission Risk Interventions     No data to display

## 2022-03-02 NOTE — Progress Notes (Signed)
Oxygen equipment delivered to the room.  Called patient's daughter, message left.  Will follow through.

## 2022-03-02 NOTE — Care Management Important Message (Signed)
Important Message  Patient Details  Name: MARITZA HOSTERMAN MRN: [id number] Date of Birth: [birth year]   Medicare Important Message Given:  N/A - LOS <3 / Initial given by admissions     Juliann Pulse A Daxson Reffett 03/02/2022, 8:09 AM

## 2022-03-02 NOTE — Progress Notes (Signed)
Called patient's daughter again, daughter did not pick up call.  Message left.

## 2022-03-02 NOTE — Progress Notes (Signed)
Discharge order in place.  Discharge instructions given to patient.  Patient verbalized understanding.  Patient waiting for oxygen tank to be delivered to room.

## 2022-03-02 NOTE — Progress Notes (Signed)
Mobility Specialist - Progress Note   Pre-mobility: SpO2 94% During mobility: SpO2 90-88% Post-mobility: SPO2 92% (Using the ear)    03/02/22 0923  Mobility  Activity Ambulated independently in hallway  Level of Assistance Modified independent, requires aide device or extra time  Assistive Device Front wheel walker  Distance Ambulated (ft) 360 ft  Activity Response Tolerated well  $Mobility charge 1 Mobility   Pt supine upon entry. Pt utilizing RA. Pt independently transferred to EOB and stood. Pt ambulated two laps around NS with SBA. During ambulation Pt SpO2 dropped to 88% halfway through and stayed within a range of 90%-88%. Pt pleth during ambulation showed a normal wave throughout the session. Pt left in chair with needs in reach. No complaints.   Robyn Butler Mobility Specialist 03/02/22 9:29 AM

## 2022-03-02 NOTE — Discharge Summary (Addendum)
Physician Discharge Summary   Patient: Robyn Butler MRN: [id number] DOB: 1929/03/04  Admit date:     02/26/2022  Discharge date: 03/02/22  Discharge Physician: Loletha Grayer   PCP: Kirk Ruths, MD   Recommendations at discharge:   Follow-up PCP 5 days  Discharge Diagnoses: Principal Problem:   Acute respiratory failure with hypoxia (HCC) Active Problems:   Aspiration into airway   Aspiration pneumonia (HCC)   Anemia due to stage 3b chronic kidney disease (HCC)   COPD (chronic obstructive pulmonary disease) (HCC)   Diabetic neuropathy (HCC)   Esophageal reflux   Essential (primary) hypertension   Adult hypothyroidism   History of tobacco use   Stage 3b chronic kidney disease (CKD) ([place])   Type 2 diabetes mellitus with hyperlipidemia [facility])    Hospital Course: Robyn Butler is a 88 year old female with history of hypothyroid, hyperlipidemia, hypertension, CKD stage IIIa, COPD, former tobacco user quitting in 1993, who presents emergency department for chief concerns of aspiration of a shrimp.  Initial vitals in the emergency department showed temperature of 98.5, respiration rate of 26, blood pressure 174/86, heart rate 94, initial SPO2 of 59% on room air, patient was placed on 15 L nonrebreather with SPO2 improving to 91%.  Serum sodium is 140, potassium 4.0, chloride 106, bicarb 25, BUN of 25, serum creatinine of 1.10, GFR 48, nonfasting blood glucose 205, WBC 7.7, hemoglobin 11.3, platelets of 246.  Portable chest x-ray was read as no focal infiltrates.  No opaque foreign bodies.  Linear densities in left lower lung suggesting scarring.  Neck soft tissue x-ray: Read as no narrowing of the airway.  Epiglottis is unremarkable.  No radiopaque foreign bodies are seen.  CT of the chest without contrast: Was read as linear patchy infiltrates in the right middle lobe and both lower lobes, more so on the left side suggesting possible multifocal atelectasis/pneumonia.   There are nodular densities in the right middle lobe and right lower lobe measuring 10 mm in size.  May be related to pneumonia.  Recommend follow-up CT in 3 months.  No radiopaque foreign bodies are seen.  ENT ENT was able to do a flexible laryngoscope and pushed a shrimp scale downward where she was able to swallow this.  Patient had no further issues swallowing.  Patient was given IV Unasyn for aspiration pneumonia and switched over to Augmentin and will complete a total of 5 days.  Unfortunately we were unable to get the patient off oxygen and she desaturated into the 80s with ambulation.  Patient was set up for 24/7 oxygen.  Assessment and Plan: * Acute respiratory failure with hypoxia ([place]) During the hospital course was up as high as 7 L of oxygen.  With ambulation the patient's pulse ox dropped down to 88%.  The patient qualifies for home oxygen.  Now has acute on chronic hypoxic respiratory failure.  Aspiration into airway ENT did a laryngoscope and pushed a small piece of shrimp scale downward where she was able to swallow it.  IV Unasyn given during the hospital course and was switched over to Augmentin and will have 3 more doses upon discharge.  Aspiration pneumonia ([place]) IV Unasyn switched over to Augmentin on 03/01/2022.  Type 2 diabetes mellitus with hyperlipidemia (HCC) Continue pravastatin Hemoglobin A1c actually low at 5.4 I discontinued sliding scale and fingersticks.  Stage 3b chronic kidney disease (CKD) ([place]) last creatinine 1.28 with a GFR 40.  Patient now stage IIIb.  History of tobacco use Smoking  cessation  Adult hypothyroidism Continue Synthroid  Essential (primary) hypertension Continue amlodipine and Diovan HCT   Esophageal reflux Continue PPI   Diabetic neuropathy (HCC) Continue gabapentin  COPD (chronic obstructive pulmonary disease) (HCC) Stable and not acutely exacerbated Continue as needed bronchodilator therapy Continue Spiriva Albuterol  inhaler prescribed   Anemia due to stage 3b chronic kidney disease (HCC) Hemoglobin 10, with a ferritin of 305.         Consultants: ENT Procedures performed: Flexible laryngoscope Disposition: Home Diet recommendation:  Cardiac diet DISCHARGE MEDICATION: Allergies as of 03/02/2022       Reactions   Alendronate Nausea And Vomiting   Other reaction(s): Vomiting        Medication List     STOP taking these medications    oxybutynin 5 MG 24 hr tablet Commonly known as: DITROPAN-XL       TAKE these medications    albuterol 108 (90 Base) MCG/ACT inhaler Commonly known as: VENTOLIN HFA Inhale 2 puffs into the lungs every 6 (six) hours as needed for wheezing or shortness of breath.   amLODipine 5 MG tablet Commonly known as: NORVASC TAKE ONE(1) TABLET EACH DAY What changed: See the new instructions.   amoxicillin-clavulanate 500-125 MG tablet Commonly known as: AUGMENTIN Take 1 tablet (500 mg total) by mouth 2 (two) times daily.   aspirin EC 81 MG tablet Take 81 mg by mouth daily.   Cranberry 500 MG Caps Take 500 mg by mouth daily.   cyanocobalamin 1000 MCG tablet Take 1,000 mcg by mouth daily.   dorzolamide 2 % ophthalmic solution Commonly known as: TRUSOPT Place 1 drop into both eyes 2 (two) times daily.   DULCOLAX BALANCE PO Take 2 tablets by mouth as needed.   feeding supplement Liqd Take 237 mLs by mouth daily.   gabapentin 100 MG capsule Commonly known as: NEURONTIN Take 1 capsule by mouth 2 (two) times daily.   ibandronate 150 MG tablet Commonly known as: BONIVA TAKE ONE TABLET ONCE A MONTH FIRST THING IN THE MORNING AT LEAST 1 HOUR BEFORE EATING, TAKE WITH WATER.   latanoprost 0.005 % ophthalmic solution Commonly known as: XALATAN Place 1 drop into both eyes at bedtime.   levothyroxine 100 MCG tablet Commonly known as: SYNTHROID TAKE 1 TABLET BY MOUTH EVERY DAY What changed: when to take this   loperamide 2 MG capsule Commonly  known as: IMODIUM Take 2 mg by mouth as needed for diarrhea or loose stools.   lovastatin 40 MG tablet Commonly known as: MEVACOR TAKE 1 TABLET BY MOUTH AT BEDTIME   magnesium oxide 400 MG tablet Commonly known as: MAG-OX Take 400 mg by mouth daily.   nortriptyline 25 MG capsule Commonly known as: PAMELOR TAKE 1 CAPSULE(25 MG) BY MOUTH AT BEDTIME   omeprazole 40 MG capsule Commonly known as: PRILOSEC Take 1 capsule (40 mg total) by mouth daily.   PreserVision AREDS 2 Caps Take 1 capsule by mouth 2 (two) times daily.   Spiriva Respimat 2.5 MCG/ACT Aers Generic drug: Tiotropium Bromide Monohydrate SMARTSIG:2 Puff(s) By Mouth Daily   valsartan-hydrochlorothiazide 160-25 MG tablet Commonly known as: DIOVAN-HCT Take 1 tablet by mouth daily.   Vitamin D3 50 MCG (2000 UT) Tabs Take 1 tablet by mouth daily.   zinc sulfate 220 (50 Zn) MG capsule Take 220 mg by mouth daily.        Discharge Exam: Filed Weights   02/26/22 1351  Weight: 55 kg   Physical Exam HENT:  Head: Normocephalic.     Mouth/Throat:     Pharynx: No oropharyngeal exudate.  Eyes:     General: Lids are normal.     Conjunctiva/sclera: Conjunctivae normal.  Cardiovascular:     Rate and Rhythm: Normal rate and regular rhythm.     Heart sounds: Normal heart sounds, S1 normal and S2 normal.  Pulmonary:     Breath sounds: Examination of the right-lower field reveals decreased breath sounds. Examination of the left-lower field reveals decreased breath sounds. Decreased breath sounds present. No wheezing, rhonchi or rales.  Abdominal:     Palpations: Abdomen is soft.     Tenderness: There is no abdominal tenderness.  Musculoskeletal:     Right lower leg: No swelling.     Left lower leg: No swelling.  Skin:    General: Skin is warm.     Findings: No rash.  Neurological:     Mental Status: She is alert and oriented to person, place, and time.      Condition at discharge: stable  The results of  significant diagnostics from this hospitalization (including imaging, microbiology, ancillary and laboratory) are listed below for reference.   Imaging Studies: CT CHEST WO CONTRAST  Result Date: 02/26/2022 CLINICAL DATA:  Foreign body suspected, chest pain EXAM: CT CHEST WITHOUT CONTRAST TECHNIQUE: Multidetector CT imaging of the chest was performed following the standard protocol without IV contrast. RADIATION DOSE REDUCTION: This exam was performed according to the departmental dose-optimization program which includes automated exposure control, adjustment of the mA and/or kV according to patient size and/or use of iterative reconstruction technique. COMPARISON:  CT done on 02/06/2008 and chest radiograph done earlier today. FINDINGS: Cardiovascular: Coronary artery calcifications are seen. Dense calcifications are seen in thoracic aorta. Minimal pericardial effusion is seen. Mediastinum/Nodes: No significant lymphadenopathy is seen. Thyroid is smaller than usual. Lungs/Pleura: There are no filling defects in the lumen of trachea and major bronchi.There are linear patchy infiltrates in right middle lobe and both lower lobes, more so on the left side. In image 96 of series 3, there is a 9 mm nodular density in right middle lobe. In image 85, there is 10 mm nodular density in right lower lobe. There is no pleural effusion or pneumothorax. Upper Abdomen: Unremarkable. Musculoskeletal: No acute findings are seen. IMPRESSION: There are linear patchy infiltrates in right middle lobe and both lower lobes, more so on the left side suggesting possible multifocal atelectasis/pneumonia. Part of this finding may suggest underlying scarring. There is no pleural effusion. There are nodular densities in right middle lobe and right lower lobe measuring up to 10 mm in size. This finding may be related to pneumonia. Follow-up CT in 3 months should be considered to assess resolution and to rule out underlying neoplastic  process. No radiopaque foreign bodies are seen. Atherosclerotic changes are noted with extensive coronary artery calcifications. Minimal pericardial effusion is seen. Electronically Signed   By: Elmer Picker M.D.   On: 02/26/2022 15:51   DG Chest Portable 1 View  Result Date: 02/26/2022 CLINICAL DATA:  Evaluate for foreign body EXAM: PORTABLE CHEST 1 VIEW COMPARISON:  08/03/2021 FINDINGS: Cardiac size is within normal limits. Lung fields are clear of any infiltrates or pulmonary edema. Linear densities in left lower lung fields suggest scarring. No radiopaque foreign bodies are seen. There is no shift of mediastinum. IMPRESSION: There are no new focal infiltrates. There are no opaque foreign bodies. Linear densities in left lower lung fields suggest scarring. Electronically Signed  By: Elmer Picker M.D.   On: 02/26/2022 14:34   DG Neck Soft Tissue  Result Date: 02/26/2022 CLINICAL DATA:  Foreign body EXAM: NECK SOFT TISSUES - 1+ VIEW COMPARISON:  None Available. FINDINGS: There is no significant narrowing of the airways. Epiglottis is not thickened. There are linear calcifications in the neck, possibly vascular. IMPRESSION: There is no narrowing of the airway. Epiglottis is unremarkable. No radiopaque foreign bodies are seen. Electronically Signed   By: Elmer Picker M.D.   On: 02/26/2022 14:33     Labs: CBC: Recent Labs  Lab 02/26/22 1409 02/26/22 1823 02/27/22 0419 02/27/22 1111 02/28/22 0807  WBC 7.7 23.5* 21.4* 19.1* 9.7  NEUTROABS 4.3  --   --  14.5*  --   HGB 11.3* 11.4* 8.5* 9.6* 10.0*  HCT 37.9 38.6 28.7* 32.3* 33.4*  MCV 100.5* 101.3* 102.5* 102.5* 100.3*  PLT 246 268 194 210 470   Basic Metabolic Panel: Recent Labs  Lab 02/26/22 1409 02/26/22 1823 02/27/22 0419 02/28/22 0807  NA 140 138 140 140  K 4.0 5.2* 4.0 3.9  CL 106 107 111 106  CO2 '25 23 23 27  '$ GLUCOSE 205* 133* 124* 130*  BUN 25* 22 25* 28*  CREATININE 1.10* 1.19* 1.33* 1.28*  CALCIUM  9.3 8.9 7.5* 8.7*    CBG: Recent Labs  Lab 02/28/22 2049 03/01/22 0915 03/01/22 1306 03/01/22 1615 03/01/22 2040  GLUCAP 170* 137* 176* 119* 148*    Discharge time spent: greater than 30 minutes.  Signed: Loletha Grayer, MD [facility] 03/02/2022

## 2022-03-10 DIAGNOSIS — I10 Essential (primary) hypertension: Secondary | ICD-10-CM | POA: Diagnosis not present

## 2022-03-10 DIAGNOSIS — Z09 Encounter for follow-up examination after completed treatment for conditions other than malignant neoplasm: Secondary | ICD-10-CM | POA: Diagnosis not present

## 2022-03-10 DIAGNOSIS — E039 Hypothyroidism, unspecified: Secondary | ICD-10-CM | POA: Diagnosis not present

## 2022-03-10 DIAGNOSIS — R1319 Other dysphagia: Secondary | ICD-10-CM | POA: Diagnosis not present

## 2022-03-10 DIAGNOSIS — D649 Anemia, unspecified: Secondary | ICD-10-CM | POA: Diagnosis not present

## 2022-03-10 DIAGNOSIS — J449 Chronic obstructive pulmonary disease, unspecified: Secondary | ICD-10-CM | POA: Diagnosis not present

## 2022-03-17 MED FILL — Iron Sucrose Inj 20 MG/ML (Fe Equiv): INTRAVENOUS | Qty: 10 | Status: AC

## 2022-03-21 ENCOUNTER — Inpatient Hospital Stay (HOSPITAL_BASED_OUTPATIENT_CLINIC_OR_DEPARTMENT_OTHER): Payer: PPO | Admitting: Internal Medicine

## 2022-03-21 ENCOUNTER — Encounter: Payer: Self-pay | Admitting: Internal Medicine

## 2022-03-21 ENCOUNTER — Inpatient Hospital Stay: Payer: PPO | Attending: Internal Medicine

## 2022-03-21 ENCOUNTER — Inpatient Hospital Stay: Payer: PPO

## 2022-03-21 DIAGNOSIS — N1832 Chronic kidney disease, stage 3b: Secondary | ICD-10-CM

## 2022-03-21 DIAGNOSIS — D631 Anemia in chronic kidney disease: Secondary | ICD-10-CM

## 2022-03-21 LAB — BASIC METABOLIC PANEL
Anion gap: 10 (ref 5–15)
BUN: 25 mg/dL — ABNORMAL HIGH (ref 8–23)
CO2: 25 mmol/L (ref 22–32)
Calcium: 8.5 mg/dL — ABNORMAL LOW (ref 8.9–10.3)
Chloride: 103 mmol/L (ref 98–111)
Creatinine, Ser: 1.04 mg/dL — ABNORMAL HIGH (ref 0.44–1.00)
GFR, Estimated: 51 mL/min — ABNORMAL LOW (ref >60)
Glucose, Bld: 150 mg/dL — ABNORMAL HIGH (ref 70–99)
Potassium: 4.4 mmol/L (ref 3.5–5.1)
Sodium: 138 mmol/L (ref 135–145)

## 2022-03-21 LAB — IRON AND TIBC
Iron: 91 ug/dL (ref 28–170)
Saturation Ratios: 40 % — ABNORMAL HIGH (ref 10.4–31.8)
TIBC: 225 ug/dL — ABNORMAL LOW (ref 250–450)
UIBC: 134 ug/dL

## 2022-03-21 LAB — CBC WITH DIFFERENTIAL/PLATELET
Abs Immature Granulocytes: 0.06 10*3/uL (ref 0.00–0.07)
Basophils Absolute: 0 10*3/uL (ref 0.0–0.1)
Basophils Relative: 0 %
Eosinophils Absolute: 0.1 10*3/uL (ref 0.0–0.5)
Eosinophils Relative: 1 %
HCT: 32.8 % — ABNORMAL LOW (ref 36.0–46.0)
Hemoglobin: 9.9 g/dL — ABNORMAL LOW (ref 12.0–15.0)
Immature Granulocytes: 1 %
Lymphocytes Relative: 30 %
Lymphs Abs: 1.4 10*3/uL (ref 0.7–4.0)
MCH: 30.2 pg (ref 26.0–34.0)
MCHC: 30.2 g/dL (ref 30.0–36.0)
MCV: 100 fL (ref 80.0–100.0)
Monocytes Absolute: 0.5 10*3/uL (ref 0.1–1.0)
Monocytes Relative: 10 %
Neutro Abs: 2.7 10*3/uL (ref 1.7–7.7)
Neutrophils Relative %: 58 %
Platelets: 249 10*3/uL (ref 150–400)
RBC: 3.28 MIL/uL — ABNORMAL LOW (ref 3.87–5.11)
RDW: 13.3 % (ref 11.5–15.5)
WBC: 4.8 10*3/uL (ref 4.0–10.5)
nRBC: 0 % (ref 0.0–0.2)

## 2022-03-21 LAB — FERRITIN: Ferritin: 247 ng/mL (ref 11–307)

## 2022-03-21 MED ORDER — EPOETIN ALFA-EPBX 10000 UNIT/ML IJ SOLN
20000.0000 [IU] | Freq: Once | INTRAMUSCULAR | Status: AC
  Administered 2022-03-21: 20000 [IU] via SUBCUTANEOUS
  Filled 2022-03-21: qty 2

## 2022-03-21 NOTE — Progress Notes (Signed)
No concerns. 

## 2022-03-21 NOTE — Assessment & Plan Note (Addendum)
#  Anemia-secondary chronic kidney disease stage III/iron deficiency [March 2021-hb-8-9]. Iron sat- 20 &; ferritin-68. Currently on IV iron infusion/Retacrit.STABLE.   # Today hb-9.9 proceed with retacrit.; again reviwed the need for ongoing retacrit for the rest of her life. Will await on iron studies/proceed with infusion. Recommend PO iron pills- gentle iron.   DISPOSITION:  # proceed with Retacrit today;HOLD IV venofer today # monthly cbc/possible retacrit x4 #  Follow up in 4 months-- MD ;labs-cbc/bmp;iron-studies/ferritin possible SQ retacrit OR venofer- Dr.B

## 2022-03-21 NOTE — Progress Notes (Signed)
Robyn Butler  Patient Care Team: Kirk Ruths, MD as PCP - General (Internal Medicine) Dingeldein, Remo Lipps, MD as Consulting Physician (Ophthalmology) Cammie Sickle, MD as Consulting Physician (Hematology and Oncology)  CHIEF COMPLAINTS/PURPOSE OF CONSULTATION: Anemia   HEMATOLOGY HISTORY  #Anemia CKD-III [name] 2021- Hb 8-9;[name];  Ferritin-100; PCP] [EGD/; colonoscopy-[facility];2016 ; capsule-]; iron saturation 20% ferritin-68; no hemolysis; on retacrit q monthly.   # CKD-III [GFR40s]  # COPD   HISTORY OF PRESENTING ILLNESS: Ambulating independently.  Accompanied by daughter  Robyn Butler 88 y.o.  female anemia/chronic kidney disease is here for follow-up.   No nausea no vomiting.  No blood in stools or black-colored stools.   Review of Systems  Constitutional:  Positive for malaise/fatigue. Negative for chills, diaphoresis, fever and weight loss.  HENT:  Negative for nosebleeds and sore throat.   Eyes:  Negative for double vision.  Respiratory:  Negative for hemoptysis, sputum production and wheezing.   Cardiovascular:  Negative for chest pain, palpitations, orthopnea and leg swelling.  Gastrointestinal:  Negative for abdominal pain, blood in stool, constipation, diarrhea, heartburn, melena, nausea and vomiting.  Genitourinary:  Negative for dysuria, frequency and urgency.  Musculoskeletal:  Positive for back pain and joint pain.  Skin: Negative.  Negative for itching and rash.  Neurological:  Negative for dizziness, tingling, focal weakness, weakness and headaches.  Endo/Heme/Allergies:  Does not bruise/bleed easily.  Psychiatric/Behavioral:  Negative for depression. The patient is not nervous/anxious and does not have insomnia.     MEDICAL HISTORY:  Past Medical History:  Diagnosis Date   Arthritis    Cancer ([place])    Chronic kidney disease    COPD (chronic obstructive pulmonary disease) ([place])    Diabetes mellitus  without complication ([place])    type 2   Hypertension    Varicose veins of lower extremities with other complications     SURGICAL HISTORY: Past Surgical History:  Procedure Laterality Date   ABDOMINAL HYSTERECTOMY  1972   BLADDER SUSPENSION     BREAST EXCISIONAL BIOPSY Right 1999   neg   CHOLECYSTECTOMY  1975   COLONOSCOPY     COLONOSCOPY WITH PROPOFOL N/A 05/24/2015   Procedure: COLONOSCOPY WITH PROPOFOL;  Surgeon: Hulen Luster, MD;  Location: [facility] ENDOSCOPY;  Service: Gastroenterology;  Laterality: N/A;   CYSTOSCOPY  1972   ESOPHAGOGASTRODUODENOSCOPY N/A 05/24/2015   Procedure: ESOPHAGOGASTRODUODENOSCOPY (EGD);  Surgeon: Hulen Luster, MD;  Location: [facility] ENDOSCOPY;  Service: Gastroenterology;  Laterality: N/A;   FRACTURE SURGERY     JOINT REPLACEMENT Right 2013   hip   salpingo oophorectmy      THYROIDECTOMY  1969   WRIST FRACTURE SURGERY Left     SOCIAL HISTORY: Social History   Socioeconomic History   Marital status: Widowed    Spouse name: Not on file   Number of children: 2   Years of education: 9th Grade   Highest education level: 9th grade  Occupational History   Occupation: Retired  Tobacco Use   Smoking status: Former    Types: Cigarettes    Quit date: 07/18/1991    Years since quitting: 30.6   Smokeless tobacco: Never  Vaping Use   Vaping Use: Never used  Substance and Sexual Activity   Alcohol use: No   Drug use: No   Sexual activity: Not Currently  Other Topics Concern   Not on file  Social History Narrative   In [place] in senior place; quit smoking [> 25 years  ago]; no alcohol; hosiery.    Social Determinants of Health   Financial Resource Strain: Low Risk  (12/21/2017)   Overall Financial Resource Strain (CARDIA)    Difficulty of Paying Living Expenses: Not hard at all  Food Insecurity: No Food Insecurity (12/21/2017)   Hunger Vital Sign    Worried About Running Out of Food in the Last Year: Never true    Ran Out of Food in the Last Year: Never true   Transportation Needs: No Transportation Needs (12/21/2017)   PRAPARE - Hydrologist (Medical): No    Lack of Transportation (Non-Medical): No  Physical Activity: Inactive (12/24/2018)   Exercise Vital Sign    Days of Exercise per Week: 0 days    Minutes of Exercise per Session: 0 min  Stress: No Stress Concern Present (12/21/2017)   [place]    Feeling of Stress : Not at all  Social Connections: Unknown (12/24/2018)   Social Connection and Isolation Panel [NHANES]    Frequency of Communication with Friends and Family: Patient refused    Frequency of Social Gatherings with Friends and Family: Patient refused    Attends Religious Services: Patient refused    Active Member of Clubs or Organizations: Patient refused    Attends Archivist Meetings: Patient refused    Marital Status: Patient refused  Intimate Partner Violence: Unknown (12/24/2018)   Humiliation, Afraid, Rape, and Kick questionnaire    Fear of Current or Ex-Partner: Patient refused    Emotionally Abused: Patient refused    Physically Abused: Patient refused    Sexually Abused: Patient refused    FAMILY HISTORY: Family History  Problem Relation Age of Onset   Heart attack Father    Asthma Father    Kidney disease Mother    Hypertension Mother    Hypertension Other    Diabetes Other    Heart attack Other    Breast cancer Neg Hx     ALLERGIES:  is allergic to alendronate.  MEDICATIONS:  Current Outpatient Medications  Medication Sig Dispense Refill   albuterol (VENTOLIN HFA) 108 (90 Base) MCG/ACT inhaler Inhale 2 puffs into the lungs every 6 (six) hours as needed for wheezing or shortness of breath. 8 g 0   amLODipine (NORVASC) 5 MG tablet TAKE ONE(1) TABLET EACH DAY (Patient taking differently: Take 5 mg by mouth daily.) 90 tablet 4   aspirin EC 81 MG tablet Take 81 mg by mouth daily.      Cholecalciferol (VITAMIN  D3) 50 MCG (2000 UT) TABS Take 1 tablet by mouth daily.     Cranberry 500 MG CAPS Take 500 mg by mouth daily.     cyanocobalamin 1000 MCG tablet Take 1,000 mcg by mouth daily.     dorzolamide (TRUSOPT) 2 % ophthalmic solution Place 1 drop into both eyes 2 (two) times daily.      gabapentin (NEURONTIN) 100 MG capsule Take 1 capsule by mouth 2 (two) times daily.     ibandronate (BONIVA) 150 MG tablet TAKE ONE TABLET ONCE A MONTH FIRST THING IN THE MORNING AT LEAST 1 HOUR BEFORE EATING, TAKE WITH WATER. 1 tablet 12   latanoprost (XALATAN) 0.005 % ophthalmic solution Place 1 drop into both eyes at bedtime.      levothyroxine (SYNTHROID) 100 MCG tablet TAKE 1 TABLET BY MOUTH EVERY DAY (Patient taking differently: Take 100 mcg by mouth daily before breakfast.) 90 tablet 3   loperamide (  IMODIUM) 2 MG capsule Take 2 mg by mouth as needed for diarrhea or loose stools.     lovastatin (MEVACOR) 40 MG tablet TAKE 1 TABLET BY MOUTH AT BEDTIME 90 tablet 0   magnesium oxide (MAG-OX) 400 MG tablet Take 400 mg by mouth daily.     Multiple Vitamins-Minerals (PRESERVISION AREDS 2) CAPS Take 1 capsule by mouth 2 (two) times daily.     nortriptyline (PAMELOR) 25 MG capsule TAKE 1 CAPSULE(25 MG) BY MOUTH AT BEDTIME 90 capsule 0   omeprazole (PRILOSEC) 40 MG capsule Take 1 capsule (40 mg total) by mouth daily. 90 capsule 4   Polyethylene Glycol 3350 (DULCOLAX BALANCE PO) Take 2 tablets by mouth as needed.     SPIRIVA RESPIMAT 2.5 MCG/ACT AERS SMARTSIG:2 Puff(s) By Mouth Daily     valsartan-hydrochlorothiazide (DIOVAN-HCT) 160-25 MG tablet Take 1 tablet by mouth daily. 90 tablet 4   zinc sulfate 220 (50 Zn) MG capsule Take 220 mg by mouth daily.     No current facility-administered medications for this visit.   Facility-Administered Medications Ordered in Other Visits  Medication Dose Route Frequency Provider Last Rate Last Admin   epoetin alfa-epbx (RETACRIT) injection 20,000 Units  20,000 Units Subcutaneous Once  Cammie Sickle, MD          PHYSICAL EXAMINATION:   Vitals:   03/21/22 1302 03/21/22 1320  BP: (!) 181/55 120/65  Pulse: 73   Temp: 98.1 F (36.7 C)   SpO2: 99%    Filed Weights   03/21/22 1302  Weight: 120 lb 3.2 oz (54.5 kg)    Physical Exam HENT:     Head: Normocephalic and atraumatic.     Mouth/Throat:     Pharynx: No oropharyngeal exudate.  Eyes:     Pupils: Pupils are equal, round, and reactive to light.  Cardiovascular:     Rate and Rhythm: Normal rate and regular rhythm.  Pulmonary:     Effort: No respiratory distress.     Breath sounds: No wheezing.  Abdominal:     General: Bowel sounds are normal. There is no distension.     Palpations: Abdomen is soft. There is no mass.     Tenderness: There is no abdominal tenderness. There is no guarding or rebound.  Musculoskeletal:        General: No tenderness. Normal range of motion.     Cervical back: Normal range of motion and neck supple.  Skin:    General: Skin is warm.  Neurological:     Mental Status: She is alert and oriented to person, place, and time.  Psychiatric:        Mood and Affect: Affect normal.     LABORATORY DATA:  I have reviewed the data as listed Lab Results  Component Value Date   WBC 4.8 03/21/2022   HGB 9.9 (L) 03/21/2022   HCT 32.8 (L) 03/21/2022   MCV 100.0 03/21/2022   PLT 249 03/21/2022   Recent Labs    02/27/22 0419 02/28/22 0807 03/21/22 1251  NA 140 140 138  K 4.0 3.9 4.4  CL 111 106 103  CO2 '23 27 25  '$ GLUCOSE 124* 130* 150*  BUN 25* 28* 25*  CREATININE 1.33* 1.28* 1.04*  CALCIUM 7.5* 8.7* 8.5*  GFRNONAA 38* 40* 51*     CT CHEST WO CONTRAST  Result Date: 02/26/2022 CLINICAL DATA:  Foreign body suspected, chest pain EXAM: CT CHEST WITHOUT CONTRAST TECHNIQUE: Multidetector CT imaging of the chest was performed following the  standard protocol without IV contrast. RADIATION DOSE REDUCTION: This exam was performed according to the departmental  dose-optimization program which includes automated exposure control, adjustment of the mA and/or kV according to patient size and/or use of iterative reconstruction technique. COMPARISON:  CT done on 02/06/2008 and chest radiograph done earlier today. FINDINGS: Cardiovascular: Coronary artery calcifications are seen. Dense calcifications are seen in thoracic aorta. Minimal pericardial effusion is seen. Mediastinum/Nodes: No significant lymphadenopathy is seen. Thyroid is smaller than usual. Lungs/Pleura: There are no filling defects in the lumen of trachea and major bronchi.There are linear patchy infiltrates in right middle lobe and both lower lobes, more so on the left side. In image 96 of series 3, there is a 9 mm nodular density in right middle lobe. In image 85, there is 10 mm nodular density in right lower lobe. There is no pleural effusion or pneumothorax. Upper Abdomen: Unremarkable. Musculoskeletal: No acute findings are seen. IMPRESSION: There are linear patchy infiltrates in right middle lobe and both lower lobes, more so on the left side suggesting possible multifocal atelectasis/pneumonia. Part of this finding may suggest underlying scarring. There is no pleural effusion. There are nodular densities in right middle lobe and right lower lobe measuring up to 10 mm in size. This finding may be related to pneumonia. Follow-up CT in 3 months should be considered to assess resolution and to rule out underlying neoplastic process. No radiopaque foreign bodies are seen. Atherosclerotic changes are noted with extensive coronary artery calcifications. Minimal pericardial effusion is seen. Electronically Signed   By: Elmer Picker M.D.   On: 02/26/2022 15:51   DG Chest Portable 1 View  Result Date: 02/26/2022 CLINICAL DATA:  Evaluate for foreign body EXAM: PORTABLE CHEST 1 VIEW COMPARISON:  08/03/2021 FINDINGS: Cardiac size is within normal limits. Lung fields are clear of any infiltrates or pulmonary  edema. Linear densities in left lower lung fields suggest scarring. No radiopaque foreign bodies are seen. There is no shift of mediastinum. IMPRESSION: There are no new focal infiltrates. There are no opaque foreign bodies. Linear densities in left lower lung fields suggest scarring. Electronically Signed   By: Elmer Picker M.D.   On: 02/26/2022 14:34   DG Neck Soft Tissue  Result Date: 02/26/2022 CLINICAL DATA:  Foreign body EXAM: NECK SOFT TISSUES - 1+ VIEW COMPARISON:  None Available. FINDINGS: There is no significant narrowing of the airways. Epiglottis is not thickened. There are linear calcifications in the neck, possibly vascular. IMPRESSION: There is no narrowing of the airway. Epiglottis is unremarkable. No radiopaque foreign bodies are seen. Electronically Signed   By: Elmer Picker M.D.   On: 02/26/2022 14:33    Anemia due to stage 3a chronic kidney disease ([place]) #Anemia-secondary chronic kidney disease stage III/iron deficiency [March 2021-hb-8-9]. Iron sat- 20 &; ferritin-68. Currently on IV iron infusion/Retacrit.STABLE.   # Today hb-9.9 proceed with retacrit.; again reviwed the need for ongoing retacrit for the rest of her life. Will await on iron studies/proceed with infusion. Recommend PO iron pills- gentle iron.   DISPOSITION:  # proceed with Retacrit today;HOLD IV venofer today # monthly cbc/possible retacrit x4 #  Follow up in 4 months-- MD ;labs-cbc/bmp;iron-studies/ferritin possible SQ retacrit OR venofer- Dr.B   All questions were answered. The patient knows to call the clinic with any problems, questions or concerns.    Cammie Sickle, MD 03/21/2022 1:32 PM

## 2022-03-30 LAB — BLOOD GAS, ARTERIAL

## 2022-04-20 ENCOUNTER — Inpatient Hospital Stay: Payer: PPO

## 2022-04-20 ENCOUNTER — Inpatient Hospital Stay: Payer: PPO | Attending: Internal Medicine

## 2022-04-20 VITALS — BP 158/65 | HR 69

## 2022-04-20 DIAGNOSIS — N1832 Chronic kidney disease, stage 3b: Secondary | ICD-10-CM | POA: Diagnosis not present

## 2022-04-20 DIAGNOSIS — D631 Anemia in chronic kidney disease: Secondary | ICD-10-CM | POA: Insufficient documentation

## 2022-04-20 LAB — CBC WITH DIFFERENTIAL/PLATELET
Abs Immature Granulocytes: 0.02 10*3/uL (ref 0.00–0.07)
Basophils Absolute: 0 10*3/uL (ref 0.0–0.1)
Basophils Relative: 1 %
Eosinophils Absolute: 0.1 10*3/uL (ref 0.0–0.5)
Eosinophils Relative: 1 %
HCT: 31 % — ABNORMAL LOW (ref 36.0–46.0)
Hemoglobin: 9.5 g/dL — ABNORMAL LOW (ref 12.0–15.0)
Immature Granulocytes: 0 %
Lymphocytes Relative: 29 %
Lymphs Abs: 1.9 10*3/uL (ref 0.7–4.0)
MCH: 30.4 pg (ref 26.0–34.0)
MCHC: 30.6 g/dL (ref 30.0–36.0)
MCV: 99 fL (ref 80.0–100.0)
Monocytes Absolute: 0.6 10*3/uL (ref 0.1–1.0)
Monocytes Relative: 9 %
Neutro Abs: 3.9 10*3/uL (ref 1.7–7.7)
Neutrophils Relative %: 60 %
Platelets: 215 10*3/uL (ref 150–400)
RBC: 3.13 MIL/uL — ABNORMAL LOW (ref 3.87–5.11)
RDW: 13.2 % (ref 11.5–15.5)
WBC: 6.5 10*3/uL (ref 4.0–10.5)
nRBC: 0 % (ref 0.0–0.2)

## 2022-04-20 MED ORDER — EPOETIN ALFA-EPBX 20000 UNIT/ML IJ SOLN
20000.0000 [IU] | Freq: Once | INTRAMUSCULAR | Status: AC
  Administered 2022-04-20: 20000 [IU] via SUBCUTANEOUS
  Filled 2022-04-20: qty 1

## 2022-05-22 ENCOUNTER — Telehealth: Payer: Self-pay | Admitting: Internal Medicine

## 2022-05-22 ENCOUNTER — Inpatient Hospital Stay: Payer: PPO

## 2022-05-22 DIAGNOSIS — R6889 Other general symptoms and signs: Secondary | ICD-10-CM | POA: Diagnosis not present

## 2022-05-22 NOTE — Telephone Encounter (Signed)
Patient called and got answering service. She wants to cancel her 11/6 appt for lab/ Retacrit inj @ 245 pm because she is sick.

## 2022-05-30 DIAGNOSIS — N183 Chronic kidney disease, stage 3 unspecified: Secondary | ICD-10-CM | POA: Diagnosis not present

## 2022-05-30 DIAGNOSIS — E1122 Type 2 diabetes mellitus with diabetic chronic kidney disease: Secondary | ICD-10-CM | POA: Diagnosis not present

## 2022-05-30 DIAGNOSIS — E039 Hypothyroidism, unspecified: Secondary | ICD-10-CM | POA: Diagnosis not present

## 2022-05-30 DIAGNOSIS — I10 Essential (primary) hypertension: Secondary | ICD-10-CM | POA: Diagnosis not present

## 2022-06-06 DIAGNOSIS — J439 Emphysema, unspecified: Secondary | ICD-10-CM | POA: Diagnosis not present

## 2022-06-06 DIAGNOSIS — Z23 Encounter for immunization: Secondary | ICD-10-CM | POA: Diagnosis not present

## 2022-06-06 DIAGNOSIS — E039 Hypothyroidism, unspecified: Secondary | ICD-10-CM | POA: Diagnosis not present

## 2022-06-06 DIAGNOSIS — E1122 Type 2 diabetes mellitus with diabetic chronic kidney disease: Secondary | ICD-10-CM | POA: Diagnosis not present

## 2022-06-06 DIAGNOSIS — E7800 Pure hypercholesterolemia, unspecified: Secondary | ICD-10-CM | POA: Diagnosis not present

## 2022-06-06 DIAGNOSIS — R053 Chronic cough: Secondary | ICD-10-CM | POA: Diagnosis not present

## 2022-06-06 DIAGNOSIS — Z0000 Encounter for general adult medical examination without abnormal findings: Secondary | ICD-10-CM | POA: Diagnosis not present

## 2022-06-06 DIAGNOSIS — I10 Essential (primary) hypertension: Secondary | ICD-10-CM | POA: Diagnosis not present

## 2022-06-06 DIAGNOSIS — N183 Chronic kidney disease, stage 3 unspecified: Secondary | ICD-10-CM | POA: Diagnosis not present

## 2022-06-06 DIAGNOSIS — J3489 Other specified disorders of nose and nasal sinuses: Secondary | ICD-10-CM | POA: Diagnosis not present

## 2022-06-06 DIAGNOSIS — J449 Chronic obstructive pulmonary disease, unspecified: Secondary | ICD-10-CM | POA: Diagnosis not present

## 2022-06-19 ENCOUNTER — Other Ambulatory Visit: Payer: Self-pay | Admitting: *Deleted

## 2022-06-19 DIAGNOSIS — D631 Anemia in chronic kidney disease: Secondary | ICD-10-CM

## 2022-06-20 ENCOUNTER — Inpatient Hospital Stay: Payer: PPO

## 2022-06-20 ENCOUNTER — Inpatient Hospital Stay: Payer: PPO | Attending: Internal Medicine

## 2022-06-20 VITALS — BP 171/56

## 2022-06-20 DIAGNOSIS — N1832 Chronic kidney disease, stage 3b: Secondary | ICD-10-CM | POA: Insufficient documentation

## 2022-06-20 DIAGNOSIS — D631 Anemia in chronic kidney disease: Secondary | ICD-10-CM | POA: Insufficient documentation

## 2022-06-20 DIAGNOSIS — J439 Emphysema, unspecified: Secondary | ICD-10-CM | POA: Diagnosis not present

## 2022-06-20 DIAGNOSIS — R9389 Abnormal findings on diagnostic imaging of other specified body structures: Secondary | ICD-10-CM | POA: Diagnosis not present

## 2022-06-20 DIAGNOSIS — I700 Atherosclerosis of aorta: Secondary | ICD-10-CM | POA: Diagnosis not present

## 2022-06-20 LAB — CBC WITH DIFFERENTIAL/PLATELET
Abs Immature Granulocytes: 0.02 10*3/uL (ref 0.00–0.07)
Basophils Absolute: 0 10*3/uL (ref 0.0–0.1)
Basophils Relative: 0 %
Eosinophils Absolute: 0.1 10*3/uL (ref 0.0–0.5)
Eosinophils Relative: 2 %
HCT: 31.3 % — ABNORMAL LOW (ref 36.0–46.0)
Hemoglobin: 9.3 g/dL — ABNORMAL LOW (ref 12.0–15.0)
Immature Granulocytes: 0 %
Lymphocytes Relative: 29 %
Lymphs Abs: 1.7 10*3/uL (ref 0.7–4.0)
MCH: 30.5 pg (ref 26.0–34.0)
MCHC: 29.7 g/dL — ABNORMAL LOW (ref 30.0–36.0)
MCV: 102.6 fL — ABNORMAL HIGH (ref 80.0–100.0)
Monocytes Absolute: 0.5 10*3/uL (ref 0.1–1.0)
Monocytes Relative: 9 %
Neutro Abs: 3.4 10*3/uL (ref 1.7–7.7)
Neutrophils Relative %: 60 %
Platelets: 236 10*3/uL (ref 150–400)
RBC: 3.05 MIL/uL — ABNORMAL LOW (ref 3.87–5.11)
RDW: 13.2 % (ref 11.5–15.5)
WBC: 5.8 10*3/uL (ref 4.0–10.5)
nRBC: 0 % (ref 0.0–0.2)

## 2022-06-20 MED ORDER — EPOETIN ALFA-EPBX 20000 UNIT/ML IJ SOLN
20000.0000 [IU] | Freq: Once | INTRAMUSCULAR | Status: AC
  Administered 2022-06-20: 20000 [IU] via SUBCUTANEOUS
  Filled 2022-06-20: qty 1

## 2022-07-20 MED FILL — Iron Sucrose Inj 20 MG/ML (Fe Equiv): INTRAVENOUS | Qty: 10 | Status: AC

## 2022-07-21 ENCOUNTER — Inpatient Hospital Stay: Payer: PPO

## 2022-07-21 ENCOUNTER — Inpatient Hospital Stay: Payer: PPO | Attending: Internal Medicine | Admitting: Internal Medicine

## 2022-07-21 ENCOUNTER — Encounter: Payer: Self-pay | Admitting: Internal Medicine

## 2022-07-21 VITALS — BP 142/57 | HR 78 | Temp 96.9°F | Resp 20 | Wt 118.9 lb

## 2022-07-21 DIAGNOSIS — J449 Chronic obstructive pulmonary disease, unspecified: Secondary | ICD-10-CM | POA: Insufficient documentation

## 2022-07-21 DIAGNOSIS — N1832 Chronic kidney disease, stage 3b: Secondary | ICD-10-CM | POA: Diagnosis not present

## 2022-07-21 DIAGNOSIS — Z79899 Other long term (current) drug therapy: Secondary | ICD-10-CM | POA: Insufficient documentation

## 2022-07-21 DIAGNOSIS — Z7982 Long term (current) use of aspirin: Secondary | ICD-10-CM | POA: Insufficient documentation

## 2022-07-21 DIAGNOSIS — Z87891 Personal history of nicotine dependence: Secondary | ICD-10-CM | POA: Insufficient documentation

## 2022-07-21 DIAGNOSIS — D631 Anemia in chronic kidney disease: Secondary | ICD-10-CM | POA: Diagnosis not present

## 2022-07-21 LAB — CBC WITH DIFFERENTIAL/PLATELET
Abs Immature Granulocytes: 0.03 10*3/uL (ref 0.00–0.07)
Basophils Absolute: 0 10*3/uL (ref 0.0–0.1)
Basophils Relative: 0 %
Eosinophils Absolute: 0.1 10*3/uL (ref 0.0–0.5)
Eosinophils Relative: 1 %
HCT: 29.3 % — ABNORMAL LOW (ref 36.0–46.0)
Hemoglobin: 9 g/dL — ABNORMAL LOW (ref 12.0–15.0)
Immature Granulocytes: 0 %
Lymphocytes Relative: 21 %
Lymphs Abs: 1.7 10*3/uL (ref 0.7–4.0)
MCH: 30.7 pg (ref 26.0–34.0)
MCHC: 30.7 g/dL (ref 30.0–36.0)
MCV: 100 fL (ref 80.0–100.0)
Monocytes Absolute: 0.9 10*3/uL (ref 0.1–1.0)
Monocytes Relative: 11 %
Neutro Abs: 5.3 10*3/uL (ref 1.7–7.7)
Neutrophils Relative %: 67 %
Platelets: 216 10*3/uL (ref 150–400)
RBC: 2.93 MIL/uL — ABNORMAL LOW (ref 3.87–5.11)
RDW: 13.2 % (ref 11.5–15.5)
WBC: 8.1 10*3/uL (ref 4.0–10.5)
nRBC: 0 % (ref 0.0–0.2)

## 2022-07-21 LAB — BASIC METABOLIC PANEL
Anion gap: 9 (ref 5–15)
BUN: 30 mg/dL — ABNORMAL HIGH (ref 8–23)
CO2: 24 mmol/L (ref 22–32)
Calcium: 8.5 mg/dL — ABNORMAL LOW (ref 8.9–10.3)
Chloride: 109 mmol/L (ref 98–111)
Creatinine, Ser: 1.05 mg/dL — ABNORMAL HIGH (ref 0.44–1.00)
GFR, Estimated: 50 mL/min — ABNORMAL LOW (ref >60)
Glucose, Bld: 150 mg/dL — ABNORMAL HIGH (ref 70–99)
Potassium: 4.3 mmol/L (ref 3.5–5.1)
Sodium: 142 mmol/L (ref 135–145)

## 2022-07-21 LAB — FERRITIN: Ferritin: 173 ng/mL (ref 11–307)

## 2022-07-21 LAB — IRON AND TIBC
Iron: 59 ug/dL (ref 28–170)
Saturation Ratios: 28 % (ref 10.4–31.8)
TIBC: 211 ug/dL — ABNORMAL LOW (ref 250–450)
UIBC: 152 ug/dL

## 2022-07-21 MED ORDER — EPOETIN ALFA-EPBX 10000 UNIT/ML IJ SOLN
20000.0000 [IU] | Freq: Once | INTRAMUSCULAR | Status: AC
  Administered 2022-07-21: 20000 [IU] via SUBCUTANEOUS
  Filled 2022-07-21: qty 2

## 2022-07-21 NOTE — Progress Notes (Signed)
Patient has no concerns 

## 2022-07-21 NOTE — Progress Notes (Signed)
[place] NOTE  Patient Care Team: Kirk Ruths, MD as PCP - General (Internal Medicine) Dingeldein, Remo Lipps, MD as Consulting Physician (Ophthalmology) Cammie Sickle, MD as Consulting Physician (Hematology and Oncology)  CHIEF COMPLAINTS/PURPOSE OF CONSULTATION: Anemia   HEMATOLOGY HISTORY  #Anemia CKD-III Robyn Butler 2021- Hb 8-9;[name];  Ferritin-100; PCP] [EGD/; colonoscopy-[facility];2016 ; capsule-]; iron saturation 20% ferritin-68; no hemolysis; on retacrit q monthly.   # CKD-III [GFR40s]  # COPD   HISTORY OF PRESENTING ILLNESS: Ambulating independently.  Accompanied by daughter  Robyn Butler 89 y.o.  female anemia/chronic kidney disease is here for follow-up.   No nausea no vomiting.  No blood in stools or black-colored stools.   Review of Systems  Constitutional:  Positive for malaise/fatigue. Negative for chills, diaphoresis, fever and weight loss.  HENT:  Negative for nosebleeds and sore throat.   Eyes:  Negative for double vision.  Respiratory:  Negative for hemoptysis, sputum production and wheezing.   Cardiovascular:  Negative for chest pain, palpitations, orthopnea and leg swelling.  Gastrointestinal:  Negative for abdominal pain, blood in stool, constipation, diarrhea, heartburn, melena, nausea and vomiting.  Genitourinary:  Negative for dysuria, frequency and urgency.  Musculoskeletal:  Positive for back pain and joint pain.  Skin: Negative.  Negative for itching and rash.  Neurological:  Negative for dizziness, tingling, focal weakness, weakness and headaches.  Endo/Heme/Allergies:  Does not bruise/bleed easily.  Psychiatric/Behavioral:  Negative for depression. The patient is not nervous/anxious and does not have insomnia.     MEDICAL HISTORY:  Past Medical History:  Diagnosis Date   Arthritis    Cancer ([place])    Chronic kidney disease    COPD (chronic obstructive pulmonary disease) ([place])    Diabetes mellitus  without complication ([place])    type 2   Hypertension    Varicose veins of lower extremities with other complications     SURGICAL HISTORY: Past Surgical History:  Procedure Laterality Date   ABDOMINAL HYSTERECTOMY  1972   BLADDER SUSPENSION     BREAST EXCISIONAL BIOPSY Right 1999   neg   CHOLECYSTECTOMY  1975   COLONOSCOPY     COLONOSCOPY WITH PROPOFOL N/A 05/24/2015   Procedure: COLONOSCOPY WITH PROPOFOL;  Surgeon: Hulen Luster, MD;  Location: [facility] ENDOSCOPY;  Service: Gastroenterology;  Laterality: N/A;   CYSTOSCOPY  1972   ESOPHAGOGASTRODUODENOSCOPY N/A 05/24/2015   Procedure: ESOPHAGOGASTRODUODENOSCOPY (EGD);  Surgeon: Hulen Luster, MD;  Location: [facility] ENDOSCOPY;  Service: Gastroenterology;  Laterality: N/A;   FRACTURE SURGERY     JOINT REPLACEMENT Right 2013   hip   salpingo oophorectmy      THYROIDECTOMY  1969   WRIST FRACTURE SURGERY Left     SOCIAL HISTORY: Social History   Socioeconomic History   Marital status: Widowed    Spouse name: Not on file   Number of children: 2   Years of education: 9th Grade   Highest education level: 9th grade  Occupational History   Occupation: Retired  Tobacco Use   Smoking status: Former    Types: Cigarettes    Quit date: 07/18/1991    Years since quitting: 31.0   Smokeless tobacco: Never  Vaping Use   Vaping Use: Never used  Substance and Sexual Activity   Alcohol use: No   Drug use: No   Sexual activity: Not Currently  Other Topics Concern   Not on file  Social History Narrative   In [place] in senior place; quit smoking [> 25 years  ago]; no alcohol; hosiery.    Social Determinants of Health   Financial Resource Strain: Low Risk  (12/21/2017)   Overall Financial Resource Strain (CARDIA)    Difficulty of Paying Living Expenses: Not hard at all  Food Insecurity: No Food Insecurity (12/21/2017)   Hunger Vital Sign    Worried About Running Out of Food in the Last Year: Never true    Ran Out of Food in the Last Year: Never true   Transportation Needs: No Transportation Needs (12/21/2017)   PRAPARE - Hydrologist (Medical): No    Lack of Transportation (Non-Medical): No  Physical Activity: Inactive (12/24/2018)   Exercise Vital Sign    Days of Exercise per Week: 0 days    Minutes of Exercise per Session: 0 min  Stress: No Stress Concern Present (12/21/2017)   [place]    Feeling of Stress : Not at all  Social Connections: Unknown (12/24/2018)   Social Connection and Isolation Panel [NHANES]    Frequency of Communication with Friends and Family: Patient refused    Frequency of Social Gatherings with Friends and Family: Patient refused    Attends Religious Services: Patient refused    Active Member of Clubs or Organizations: Patient refused    Attends Archivist Meetings: Patient refused    Marital Status: Patient refused  Intimate Partner Violence: Unknown (12/24/2018)   Humiliation, Afraid, Rape, and Kick questionnaire    Fear of Current or Ex-Partner: Patient refused    Emotionally Abused: Patient refused    Physically Abused: Patient refused    Sexually Abused: Patient refused    FAMILY HISTORY: Family History  Problem Relation Age of Onset   Heart attack Father    Asthma Father    Kidney disease Mother    Hypertension Mother    Hypertension Other    Diabetes Other    Heart attack Other    Breast cancer Neg Hx     ALLERGIES:  is allergic to alendronate.  MEDICATIONS:  Current Outpatient Medications  Medication Sig Dispense Refill   albuterol (VENTOLIN HFA) 108 (90 Base) MCG/ACT inhaler Inhale 2 puffs into the lungs every 6 (six) hours as needed for wheezing or shortness of breath. 8 g 0   amLODipine (NORVASC) 5 MG tablet TAKE ONE(1) TABLET EACH DAY (Patient taking differently: Take 5 mg by mouth daily.) 90 tablet 4   aspirin EC 81 MG tablet Take 81 mg by mouth daily.      Cholecalciferol (VITAMIN  D3) 50 MCG (2000 UT) TABS Take 1 tablet by mouth daily.     Cranberry 500 MG CAPS Take 500 mg by mouth daily.     cyanocobalamin 1000 MCG tablet Take 1,000 mcg by mouth daily.     dorzolamide (TRUSOPT) 2 % ophthalmic solution Place 1 drop into both eyes 2 (two) times daily.      gabapentin (NEURONTIN) 100 MG capsule Take 1 capsule by mouth 2 (two) times daily.     ibandronate (BONIVA) 150 MG tablet TAKE ONE TABLET ONCE A MONTH FIRST THING IN THE MORNING AT LEAST 1 HOUR BEFORE EATING, TAKE WITH WATER. 1 tablet 12   latanoprost (XALATAN) 0.005 % ophthalmic solution Place 1 drop into both eyes at bedtime.      levothyroxine (SYNTHROID) 100 MCG tablet TAKE 1 TABLET BY MOUTH EVERY DAY (Patient taking differently: Take 100 mcg by mouth daily before breakfast.) 90 tablet 3   loperamide (  IMODIUM) 2 MG capsule Take 2 mg by mouth as needed for diarrhea or loose stools.     lovastatin (MEVACOR) 40 MG tablet TAKE 1 TABLET BY MOUTH AT BEDTIME 90 tablet 0   magnesium oxide (MAG-OX) 400 MG tablet Take 400 mg by mouth daily.     Multiple Vitamins-Minerals (PRESERVISION AREDS 2) CAPS Take 1 capsule by mouth 2 (two) times daily.     nortriptyline (PAMELOR) 25 MG capsule TAKE 1 CAPSULE(25 MG) BY MOUTH AT BEDTIME 90 capsule 0   omeprazole (PRILOSEC) 40 MG capsule Take 1 capsule (40 mg total) by mouth daily. 90 capsule 4   Polyethylene Glycol 3350 (DULCOLAX BALANCE PO) Take 2 tablets by mouth as needed.     SPIRIVA RESPIMAT 2.5 MCG/ACT AERS SMARTSIG:2 Puff(s) By Mouth Daily     valsartan-hydrochlorothiazide (DIOVAN-HCT) 160-25 MG tablet Take 1 tablet by mouth daily. 90 tablet 4   zinc sulfate 220 (50 Zn) MG capsule Take 220 mg by mouth daily.     No current facility-administered medications for this visit.      PHYSICAL EXAMINATION:   Vitals:   07/21/22 1355  BP: (!) 142/57  Pulse: 78  Resp: 20  Temp: (!) 96.9 F (36.1 C)  SpO2: 100%   Filed Weights   07/21/22 1355  Weight: 118 lb 14.4 oz (53.9 kg)     Physical Exam HENT:     Head: Normocephalic and atraumatic.     Mouth/Throat:     Pharynx: No oropharyngeal exudate.  Eyes:     Pupils: Pupils are equal, round, and reactive to light.  Cardiovascular:     Rate and Rhythm: Normal rate and regular rhythm.  Pulmonary:     Effort: No respiratory distress.     Breath sounds: No wheezing.  Abdominal:     General: Bowel sounds are normal. There is no distension.     Palpations: Abdomen is soft. There is no mass.     Tenderness: There is no abdominal tenderness. There is no guarding or rebound.  Musculoskeletal:        General: No tenderness. Normal range of motion.     Cervical back: Normal range of motion and neck supple.  Skin:    General: Skin is warm.  Neurological:     Mental Status: She is alert and oriented to person, place, and time.  Psychiatric:        Mood and Affect: Affect normal.     LABORATORY DATA:  I have reviewed the data as listed Lab Results  Component Value Date   WBC 8.1 07/21/2022   HGB 9.0 (L) 07/21/2022   HCT 29.3 (L) 07/21/2022   MCV 100.0 07/21/2022   PLT 216 07/21/2022   Recent Labs    02/28/22 0807 03/21/22 1251 07/21/22 1305  NA 140 138 142  K 3.9 4.4 4.3  CL 106 103 109  CO2 '27 25 24  '$ GLUCOSE 130* 150* 150*  BUN 28* 25* 30*  CREATININE 1.28* 1.04* 1.05*  CALCIUM 8.7* 8.5* 8.5*  GFRNONAA 40* 51* 50*     No results found.  Anemia due to stage 3a chronic kidney disease ([facility]) #Anemia-secondary chronic kidney disease stage III/iron deficiency [March 2021-hb-8-9]. Iron sat- 20 &; ferritin-68. Currently on IV iron infusion/Retacrit.STABLE.   # Today hb-9.9 proceed with retacrit.; again reviwed the need for ongoing retacrit for the rest of her life. Will await on iron studies/proceed with infusion. Recommend PO iron pills- gentle iron.   DISPOSITION:  # proceed with  Retacrit today;HOLD IV venofer today # monthly cbc/possible retacrit x4 #  Follow up in 4 months-- MD  ;labs-cbc/bmp;iron-studies/ferritin possible SQ retacrit OR venofer- Dr.B   All questions were answered. The patient knows to call the clinic with any problems, questions or concerns.    Cammie Sickle, MD 07/21/2022 3:01 PM

## 2022-07-21 NOTE — Assessment & Plan Note (Addendum)
#  Anemia-secondary chronic kidney disease stage III/iron deficiency [March 2021-hb-8-9].  Currently on IV iron infusion/Retacrit.STABLE.   # Today hb-9.9 proceed with retacrit.; again reviwed the need for ongoing retacrit for the rest of her life. SEP Iron sat- 40 &; ferritin-247.  Recommend continue PO iron pills- gentle iron.   DISPOSITION:  # proceed with Retacrit today; HOLD IV venofer today # monthly cbc/possible retacrit x4 #  Follow up in 4 months-- MD ;labs-cbc/bmp;iron-studies/ferritin possible SQ retacrit OR venofer- Dr.B

## 2022-08-15 DIAGNOSIS — D3132 Benign neoplasm of left choroid: Secondary | ICD-10-CM | POA: Diagnosis not present

## 2022-08-15 DIAGNOSIS — H353290 Exudative age-related macular degeneration, unspecified eye, stage unspecified: Secondary | ICD-10-CM | POA: Diagnosis not present

## 2022-08-15 DIAGNOSIS — H401131 Primary open-angle glaucoma, bilateral, mild stage: Secondary | ICD-10-CM | POA: Diagnosis not present

## 2022-08-16 DIAGNOSIS — C44311 Basal cell carcinoma of skin of nose: Secondary | ICD-10-CM | POA: Diagnosis not present

## 2022-08-16 DIAGNOSIS — C44319 Basal cell carcinoma of skin of other parts of face: Secondary | ICD-10-CM | POA: Diagnosis not present

## 2022-08-16 DIAGNOSIS — L821 Other seborrheic keratosis: Secondary | ICD-10-CM | POA: Diagnosis not present

## 2022-08-16 DIAGNOSIS — D485 Neoplasm of uncertain behavior of skin: Secondary | ICD-10-CM | POA: Diagnosis not present

## 2022-08-16 DIAGNOSIS — L578 Other skin changes due to chronic exposure to nonionizing radiation: Secondary | ICD-10-CM | POA: Diagnosis not present

## 2022-08-21 ENCOUNTER — Inpatient Hospital Stay: Payer: PPO | Attending: Internal Medicine

## 2022-08-21 ENCOUNTER — Inpatient Hospital Stay: Payer: PPO

## 2022-08-21 VITALS — BP 174/63 | HR 83 | Resp 18

## 2022-08-21 DIAGNOSIS — N1832 Chronic kidney disease, stage 3b: Secondary | ICD-10-CM

## 2022-08-21 DIAGNOSIS — D631 Anemia in chronic kidney disease: Secondary | ICD-10-CM | POA: Diagnosis not present

## 2022-08-21 LAB — CBC WITH DIFFERENTIAL/PLATELET
Abs Immature Granulocytes: 0.07 10*3/uL (ref 0.00–0.07)
Basophils Absolute: 0 10*3/uL (ref 0.0–0.1)
Basophils Relative: 0 %
Eosinophils Absolute: 0.1 10*3/uL (ref 0.0–0.5)
Eosinophils Relative: 2 %
HCT: 32.1 % — ABNORMAL LOW (ref 36.0–46.0)
Hemoglobin: 9.4 g/dL — ABNORMAL LOW (ref 12.0–15.0)
Immature Granulocytes: 1 %
Lymphocytes Relative: 32 %
Lymphs Abs: 1.7 10*3/uL (ref 0.7–4.0)
MCH: 29.8 pg (ref 26.0–34.0)
MCHC: 29.3 g/dL — ABNORMAL LOW (ref 30.0–36.0)
MCV: 101.9 fL — ABNORMAL HIGH (ref 80.0–100.0)
Monocytes Absolute: 0.6 10*3/uL (ref 0.1–1.0)
Monocytes Relative: 11 %
Neutro Abs: 2.8 10*3/uL (ref 1.7–7.7)
Neutrophils Relative %: 54 %
Platelets: 179 10*3/uL (ref 150–400)
RBC: 3.15 MIL/uL — ABNORMAL LOW (ref 3.87–5.11)
RDW: 12.8 % (ref 11.5–15.5)
WBC: 5.3 10*3/uL (ref 4.0–10.5)
nRBC: 0 % (ref 0.0–0.2)

## 2022-08-21 MED ORDER — EPOETIN ALFA-EPBX 10000 UNIT/ML IJ SOLN
20000.0000 [IU] | Freq: Once | INTRAMUSCULAR | Status: AC
  Administered 2022-08-21: 20000 [IU] via SUBCUTANEOUS
  Filled 2022-08-21: qty 2

## 2022-08-28 DIAGNOSIS — C44311 Basal cell carcinoma of skin of nose: Secondary | ICD-10-CM | POA: Diagnosis not present

## 2022-08-28 DIAGNOSIS — C44319 Basal cell carcinoma of skin of other parts of face: Secondary | ICD-10-CM | POA: Diagnosis not present

## 2022-09-11 DIAGNOSIS — C44311 Basal cell carcinoma of skin of nose: Secondary | ICD-10-CM | POA: Diagnosis not present

## 2022-09-11 DIAGNOSIS — Z48817 Encounter for surgical aftercare following surgery on the skin and subcutaneous tissue: Secondary | ICD-10-CM | POA: Diagnosis not present

## 2022-09-19 ENCOUNTER — Inpatient Hospital Stay: Payer: PPO

## 2022-09-19 ENCOUNTER — Inpatient Hospital Stay: Payer: PPO | Attending: Internal Medicine

## 2022-09-19 VITALS — BP 151/51 | HR 78 | Temp 97.0°F

## 2022-09-19 DIAGNOSIS — N1832 Chronic kidney disease, stage 3b: Secondary | ICD-10-CM | POA: Diagnosis not present

## 2022-09-19 DIAGNOSIS — D631 Anemia in chronic kidney disease: Secondary | ICD-10-CM | POA: Insufficient documentation

## 2022-09-19 LAB — CBC WITH DIFFERENTIAL/PLATELET
Abs Immature Granulocytes: 0.03 10*3/uL (ref 0.00–0.07)
Basophils Absolute: 0 10*3/uL (ref 0.0–0.1)
Basophils Relative: 0 %
Eosinophils Absolute: 0.1 10*3/uL (ref 0.0–0.5)
Eosinophils Relative: 1 %
HCT: 32.5 % — ABNORMAL LOW (ref 36.0–46.0)
Hemoglobin: 9.7 g/dL — ABNORMAL LOW (ref 12.0–15.0)
Immature Granulocytes: 0 %
Lymphocytes Relative: 25 %
Lymphs Abs: 2.1 10*3/uL (ref 0.7–4.0)
MCH: 30.6 pg (ref 26.0–34.0)
MCHC: 29.8 g/dL — ABNORMAL LOW (ref 30.0–36.0)
MCV: 102.5 fL — ABNORMAL HIGH (ref 80.0–100.0)
Monocytes Absolute: 0.9 10*3/uL (ref 0.1–1.0)
Monocytes Relative: 11 %
Neutro Abs: 5.1 10*3/uL (ref 1.7–7.7)
Neutrophils Relative %: 63 %
Platelets: 220 10*3/uL (ref 150–400)
RBC: 3.17 MIL/uL — ABNORMAL LOW (ref 3.87–5.11)
RDW: 12.8 % (ref 11.5–15.5)
WBC: 8.2 10*3/uL (ref 4.0–10.5)
nRBC: 0 % (ref 0.0–0.2)

## 2022-09-19 MED ORDER — EPOETIN ALFA-EPBX 10000 UNIT/ML IJ SOLN
20000.0000 [IU] | Freq: Once | INTRAMUSCULAR | Status: AC
  Administered 2022-09-19: 20000 [IU] via SUBCUTANEOUS
  Filled 2022-09-19 (×2): qty 2

## 2022-10-20 ENCOUNTER — Inpatient Hospital Stay: Payer: PPO

## 2022-10-20 ENCOUNTER — Inpatient Hospital Stay: Payer: PPO | Attending: Internal Medicine

## 2022-10-20 VITALS — BP 147/56

## 2022-10-20 DIAGNOSIS — D631 Anemia in chronic kidney disease: Secondary | ICD-10-CM | POA: Diagnosis not present

## 2022-10-20 DIAGNOSIS — N1832 Chronic kidney disease, stage 3b: Secondary | ICD-10-CM

## 2022-10-20 LAB — CBC WITH DIFFERENTIAL/PLATELET
Abs Immature Granulocytes: 0.04 10*3/uL (ref 0.00–0.07)
Basophils Absolute: 0 10*3/uL (ref 0.0–0.1)
Basophils Relative: 0 %
Eosinophils Absolute: 0.1 10*3/uL (ref 0.0–0.5)
Eosinophils Relative: 2 %
HCT: 31.3 % — ABNORMAL LOW (ref 36.0–46.0)
Hemoglobin: 9.4 g/dL — ABNORMAL LOW (ref 12.0–15.0)
Immature Granulocytes: 1 %
Lymphocytes Relative: 25 %
Lymphs Abs: 1.9 10*3/uL (ref 0.7–4.0)
MCH: 30.5 pg (ref 26.0–34.0)
MCHC: 30 g/dL (ref 30.0–36.0)
MCV: 101.6 fL — ABNORMAL HIGH (ref 80.0–100.0)
Monocytes Absolute: 0.9 10*3/uL (ref 0.1–1.0)
Monocytes Relative: 11 %
Neutro Abs: 4.8 10*3/uL (ref 1.7–7.7)
Neutrophils Relative %: 61 %
Platelets: 192 10*3/uL (ref 150–400)
RBC: 3.08 MIL/uL — ABNORMAL LOW (ref 3.87–5.11)
RDW: 12.6 % (ref 11.5–15.5)
WBC: 7.8 10*3/uL (ref 4.0–10.5)
nRBC: 0 % (ref 0.0–0.2)

## 2022-10-20 LAB — BASIC METABOLIC PANEL
Anion gap: 9 (ref 5–15)
BUN: 39 mg/dL — ABNORMAL HIGH (ref 8–23)
CO2: 23 mmol/L (ref 22–32)
Calcium: 8.6 mg/dL — ABNORMAL LOW (ref 8.9–10.3)
Chloride: 104 mmol/L (ref 98–111)
Creatinine, Ser: 1.07 mg/dL — ABNORMAL HIGH (ref 0.44–1.00)
GFR, Estimated: 49 mL/min — ABNORMAL LOW (ref >60)
Glucose, Bld: 122 mg/dL — ABNORMAL HIGH (ref 70–99)
Potassium: 4.1 mmol/L (ref 3.5–5.1)
Sodium: 136 mmol/L (ref 135–145)

## 2022-10-20 LAB — IRON AND TIBC
Iron: 74 ug/dL (ref 28–170)
Saturation Ratios: 29 % (ref 10.4–31.8)
TIBC: 256 ug/dL (ref 250–450)
UIBC: 182 ug/dL

## 2022-10-20 LAB — FERRITIN: Ferritin: 303 ng/mL (ref 11–307)

## 2022-10-20 MED ORDER — EPOETIN ALFA-EPBX 10000 UNIT/ML IJ SOLN
20000.0000 [IU] | Freq: Once | INTRAMUSCULAR | Status: AC
  Administered 2022-10-20: 20000 [IU] via SUBCUTANEOUS
  Filled 2022-10-20: qty 2

## 2022-11-17 MED FILL — Iron Sucrose Inj 20 MG/ML (Fe Equiv): INTRAVENOUS | Qty: 10 | Status: AC

## 2022-11-20 ENCOUNTER — Inpatient Hospital Stay (HOSPITAL_BASED_OUTPATIENT_CLINIC_OR_DEPARTMENT_OTHER): Payer: PPO | Admitting: Internal Medicine

## 2022-11-20 ENCOUNTER — Encounter: Payer: Self-pay | Admitting: Internal Medicine

## 2022-11-20 ENCOUNTER — Inpatient Hospital Stay: Payer: PPO | Attending: Internal Medicine

## 2022-11-20 ENCOUNTER — Inpatient Hospital Stay: Payer: PPO

## 2022-11-20 VITALS — BP 108/73 | HR 81 | Temp 97.9°F | Ht 62.0 in | Wt 121.8 lb

## 2022-11-20 DIAGNOSIS — J449 Chronic obstructive pulmonary disease, unspecified: Secondary | ICD-10-CM | POA: Diagnosis not present

## 2022-11-20 DIAGNOSIS — Z79899 Other long term (current) drug therapy: Secondary | ICD-10-CM | POA: Insufficient documentation

## 2022-11-20 DIAGNOSIS — Z87891 Personal history of nicotine dependence: Secondary | ICD-10-CM | POA: Insufficient documentation

## 2022-11-20 DIAGNOSIS — N1832 Chronic kidney disease, stage 3b: Secondary | ICD-10-CM | POA: Diagnosis not present

## 2022-11-20 DIAGNOSIS — D631 Anemia in chronic kidney disease: Secondary | ICD-10-CM | POA: Diagnosis not present

## 2022-11-20 DIAGNOSIS — Z7982 Long term (current) use of aspirin: Secondary | ICD-10-CM | POA: Insufficient documentation

## 2022-11-20 LAB — CBC WITH DIFFERENTIAL/PLATELET
Abs Immature Granulocytes: 0.03 10*3/uL (ref 0.00–0.07)
Basophils Absolute: 0 10*3/uL (ref 0.0–0.1)
Basophils Relative: 1 %
Eosinophils Absolute: 0.2 10*3/uL (ref 0.0–0.5)
Eosinophils Relative: 2 %
HCT: 32.7 % — ABNORMAL LOW (ref 36.0–46.0)
Hemoglobin: 9.7 g/dL — ABNORMAL LOW (ref 12.0–15.0)
Immature Granulocytes: 0 %
Lymphocytes Relative: 26 %
Lymphs Abs: 2.1 10*3/uL (ref 0.7–4.0)
MCH: 30.3 pg (ref 26.0–34.0)
MCHC: 29.7 g/dL — ABNORMAL LOW (ref 30.0–36.0)
MCV: 102.2 fL — ABNORMAL HIGH (ref 80.0–100.0)
Monocytes Absolute: 1 10*3/uL (ref 0.1–1.0)
Monocytes Relative: 12 %
Neutro Abs: 4.9 10*3/uL (ref 1.7–7.7)
Neutrophils Relative %: 59 %
Platelets: 227 10*3/uL (ref 150–400)
RBC: 3.2 MIL/uL — ABNORMAL LOW (ref 3.87–5.11)
RDW: 12.7 % (ref 11.5–15.5)
WBC: 8.1 10*3/uL (ref 4.0–10.5)
nRBC: 0 % (ref 0.0–0.2)

## 2022-11-20 MED ORDER — EPOETIN ALFA-EPBX 10000 UNIT/ML IJ SOLN
20000.0000 [IU] | Freq: Once | INTRAMUSCULAR | Status: AC
  Administered 2022-11-20: 20000 [IU] via SUBCUTANEOUS

## 2022-11-20 NOTE — Progress Notes (Signed)
Fatigue/weakness: no Dyspena: no Light headedness: sometimes Blood in stool: no  Feet feel swollen and tight at night sometimes.

## 2022-11-20 NOTE — Assessment & Plan Note (Addendum)
#  Anemia-secondary chronic kidney disease stage III/iron deficiency [March 2021-hb-8-9].  Currently on IV iron infusion/Retacrit.stable.   # Today hb-9.7 proceed with retacrit.; again reviwed the need for ongoing retacrit for the rest of her life.  APRIL 2024- Iron sat- 27 &; ferritin-303- continue PO iron pills- gentle iron.   DISPOSITION:  # proceed with Retacrit today; HOLD IV venofer today # monthly cbc/possible retacrit x4 #  Follow up in 4 months-- MD ;labs-cbc/bmp;iron-studies/ferritin possible SQ retacrit OR venofer- Dr.B

## 2022-11-20 NOTE — Progress Notes (Signed)
[facility] CONSULT NOTE  Patient Care Team: Lauro Regulus, MD as PCP - General (Internal Medicine) Dingeldein, Viviann Spare, MD as Consulting Physician (Ophthalmology) Earna Coder, MD as Consulting Physician (Hematology and Oncology)  CHIEF COMPLAINTS/PURPOSE OF CONSULTATION: Anemia   HEMATOLOGY HISTORY  #Anemia CKD-III Robyn Butler 2021- Hb 8-9;[name];  Ferritin-100; PCP] [EGD/; colonoscopy-[facility];2016 ; capsule-]; iron saturation 20% ferritin-68; no hemolysis; on retacrit q monthly.   # CKD-III [GFR40s]  # COPD   HISTORY OF PRESENTING ILLNESS: Ambulating independently.  Accompanied by daughter  Robyn Butler 89 y.o.  female anemia/chronic kidney disease is here for follow-up.  Fatigue/weakness: no Dyspena: no Light headedness: sometimes Blood in stool: no   Feet feel swollen and tight at night sometimes   No nausea no vomiting.  No blood in stools or black-colored stools.   Review of Systems  Constitutional:  Positive for malaise/fatigue. Negative for chills, diaphoresis, fever and weight loss.  HENT:  Negative for nosebleeds and sore throat.   Eyes:  Negative for double vision.  Respiratory:  Negative for hemoptysis, sputum production and wheezing.   Cardiovascular:  Negative for chest pain, palpitations, orthopnea and leg swelling.  Gastrointestinal:  Negative for abdominal pain, blood in stool, constipation, diarrhea, heartburn, melena, nausea and vomiting.  Genitourinary:  Negative for dysuria, frequency and urgency.  Musculoskeletal:  Positive for back pain and joint pain.  Skin: Negative.  Negative for itching and rash.  Neurological:  Negative for dizziness, tingling, focal weakness, weakness and headaches.  Endo/Heme/Allergies:  Does not bruise/bleed easily.  Psychiatric/Behavioral:  Negative for depression. The patient is not nervous/anxious and does not have insomnia.     MEDICAL HISTORY:  Past Medical History:  Diagnosis Date    Arthritis    Cancer (HCC)    Chronic kidney disease    COPD (chronic obstructive pulmonary disease) (HCC)    Diabetes mellitus without complication (HCC)    type 2   Hypertension    Varicose veins of lower extremities with other complications     SURGICAL HISTORY: Past Surgical History:  Procedure Laterality Date   ABDOMINAL HYSTERECTOMY  1972   BLADDER SUSPENSION     BREAST EXCISIONAL BIOPSY Right 1999   neg   CHOLECYSTECTOMY  1975   COLONOSCOPY     COLONOSCOPY WITH PROPOFOL N/A 05/24/2015   Procedure: COLONOSCOPY WITH PROPOFOL;  Surgeon: Wallace Cullens, MD;  Location: [facility] ENDOSCOPY;  Service: Gastroenterology;  Laterality: N/A;   CYSTOSCOPY  1972   ESOPHAGOGASTRODUODENOSCOPY N/A 05/24/2015   Procedure: ESOPHAGOGASTRODUODENOSCOPY (EGD);  Surgeon: Wallace Cullens, MD;  Location: [facility] ENDOSCOPY;  Service: Gastroenterology;  Laterality: N/A;   FRACTURE SURGERY     JOINT REPLACEMENT Right 2013   hip   salpingo oophorectmy      THYROIDECTOMY  1969   WRIST FRACTURE SURGERY Left     SOCIAL HISTORY: Social History   Socioeconomic History   Marital status: Widowed    Spouse name: Not on file   Number of children: 2   Years of education: 9th Grade   Highest education level: 9th grade  Occupational History   Occupation: Retired  Tobacco Use   Smoking status: Former    Types: Cigarettes    Quit date: 07/18/1991    Years since quitting: 31.3   Smokeless tobacco: Never  Vaping Use   Vaping Use: Never used  Substance and Sexual Activity   Alcohol use: No   Drug use: No   Sexual activity: Not Currently  Other Topics  Concern   Not on file  Social History Narrative   In [place] in senior place; quit smoking [> 25 years ago]; no alcohol; hosiery.    Social Determinants of Health   Financial Resource Strain: Low Risk  (12/21/2017)   Overall Financial Resource Strain (CARDIA)    Difficulty of Paying Living Expenses: Not hard at all  Food Insecurity: No Food Insecurity (12/21/2017)    Hunger Vital Sign    Worried About Running Out of Food in the Last Year: Never true    Ran Out of Food in the Last Year: Never true  Transportation Needs: No Transportation Needs (12/21/2017)   PRAPARE - Administrator, Civil Service (Medical): No    Lack of Transportation (Non-Medical): No  Physical Activity: Inactive (12/24/2018)   Exercise Vital Sign    Days of Exercise per Week: 0 days    Minutes of Exercise per Session: 0 min  Stress: No Stress Concern Present (12/21/2017)   [employer] of Occupational Health - Occupational Stress Questionnaire    Feeling of Stress : Not at all  Social Connections: Unknown (12/24/2018)   Social Connection and Isolation Panel [NHANES]    Frequency of Communication with Friends and Family: Patient declined    Frequency of Social Gatherings with Friends and Family: Patient declined    Attends Religious Services: Patient declined    Database administrator or Organizations: Patient declined    Attends Banker Meetings: Patient declined    Marital Status: Patient declined  Intimate Partner Violence: Unknown (12/24/2018)   Humiliation, Afraid, Rape, and Kick questionnaire    Fear of Current or Ex-Partner: Patient declined    Emotionally Abused: Patient declined    Physically Abused: Patient declined    Sexually Abused: Patient declined    FAMILY HISTORY: Family History  Problem Relation Age of Onset   Heart attack Father    Asthma Father    Kidney disease Mother    Hypertension Mother    Hypertension Other    Diabetes Other    Heart attack Other    Breast cancer Neg Hx     ALLERGIES:  is allergic to alendronate.  MEDICATIONS:  Current Outpatient Medications  Medication Sig Dispense Refill   albuterol (VENTOLIN HFA) 108 (90 Base) MCG/ACT inhaler Inhale 2 puffs into the lungs every 6 (six) hours as needed for wheezing or shortness of breath. 8 g 0   amLODipine (NORVASC) 5 MG tablet TAKE ONE(1) TABLET EACH DAY (Patient  taking differently: Take 5 mg by mouth daily.) 90 tablet 4   aspirin EC 81 MG tablet Take 81 mg by mouth daily.      Cholecalciferol (VITAMIN D3) 50 MCG (2000 UT) TABS Take 1 tablet by mouth daily.     Cranberry 500 MG CAPS Take 500 mg by mouth daily.     cyanocobalamin 1000 MCG tablet Take 1,000 mcg by mouth daily.     dorzolamide (TRUSOPT) 2 % ophthalmic solution Place 1 drop into both eyes 2 (two) times daily.      ibandronate (BONIVA) 150 MG tablet TAKE ONE TABLET ONCE A MONTH FIRST THING IN THE MORNING AT LEAST 1 HOUR BEFORE EATING, TAKE WITH WATER. 1 tablet 12   latanoprost (XALATAN) 0.005 % ophthalmic solution Place 1 drop into both eyes at bedtime.      levothyroxine (SYNTHROID) 100 MCG tablet TAKE 1 TABLET BY MOUTH EVERY DAY (Patient taking differently: Take 100 mcg by mouth daily before breakfast.) 90 tablet  3   loperamide (IMODIUM) 2 MG capsule Take 2 mg by mouth as needed for diarrhea or loose stools.     lovastatin (MEVACOR) 40 MG tablet TAKE 1 TABLET BY MOUTH AT BEDTIME 90 tablet 0   magnesium oxide (MAG-OX) 400 MG tablet Take 400 mg by mouth daily.     Multiple Vitamins-Minerals (PRESERVISION AREDS 2) CAPS Take 1 capsule by mouth 2 (two) times daily.     nortriptyline (PAMELOR) 25 MG capsule TAKE 1 CAPSULE(25 MG) BY MOUTH AT BEDTIME 90 capsule 0   omeprazole (PRILOSEC) 40 MG capsule Take 1 capsule (40 mg total) by mouth daily. 90 capsule 4   Polyethylene Glycol 3350 (DULCOLAX BALANCE PO) Take 2 tablets by mouth as needed.     SPIRIVA RESPIMAT 2.5 MCG/ACT AERS SMARTSIG:2 Puff(s) By Mouth Daily     valsartan-hydrochlorothiazide (DIOVAN-HCT) 160-25 MG tablet Take 1 tablet by mouth daily. 90 tablet 4   zinc sulfate 220 (50 Zn) MG capsule Take 220 mg by mouth daily.     gabapentin (NEURONTIN) 100 MG capsule Take 1 capsule by mouth 2 (two) times daily.     No current facility-administered medications for this visit.      PHYSICAL EXAMINATION:   Vitals:   11/20/22 1320  BP:  108/73  Pulse: 81  Temp: 97.9 F (36.6 C)  SpO2: 100%   Filed Weights   11/20/22 1320  Weight: 121 lb 12.8 oz (55.2 kg)    Physical Exam HENT:     Head: Normocephalic and atraumatic.     Mouth/Throat:     Pharynx: No oropharyngeal exudate.  Eyes:     Pupils: Pupils are equal, round, and reactive to light.  Cardiovascular:     Rate and Rhythm: Normal rate and regular rhythm.  Pulmonary:     Effort: No respiratory distress.     Breath sounds: No wheezing.  Abdominal:     General: Bowel sounds are normal. There is no distension.     Palpations: Abdomen is soft. There is no mass.     Tenderness: There is no abdominal tenderness. There is no guarding or rebound.  Musculoskeletal:        General: No tenderness. Normal range of motion.     Cervical back: Normal range of motion and neck supple.  Skin:    General: Skin is warm.  Neurological:     Mental Status: She is alert and oriented to person, place, and time.  Psychiatric:        Mood and Affect: Affect normal.     LABORATORY DATA:  I have reviewed the data as listed Lab Results  Component Value Date   WBC 8.1 11/20/2022   HGB 9.7 (L) 11/20/2022   HCT 32.7 (L) 11/20/2022   MCV 102.2 (H) 11/20/2022   PLT 227 11/20/2022   Recent Labs    03/21/22 1251 07/21/22 1305 10/20/22 1326  NA 138 142 136  K 4.4 4.3 4.1  CL 103 109 104  CO2 25 24 23   GLUCOSE 150* 150* 122*  BUN 25* 30* 39*  CREATININE 1.04* 1.05* 1.07*  CALCIUM 8.5* 8.5* 8.6*  GFRNONAA 51* 50* 49*     No results found.  Anemia due to stage 3a chronic kidney disease (HCC) #Anemia-secondary chronic kidney disease stage III/iron deficiency [March 2021-hb-8-9].  Currently on IV iron infusion/Retacrit.STABLE.   # Today hb-9.9 proceed with retacrit.; again reviwed the need for ongoing retacrit for the rest of her life. SEP Iron sat- 40 &; ferritin-247.  Recommend continue PO iron pills- gentle iron.   DISPOSITION:  # proceed with Retacrit today; HOLD  IV venofer today # monthly cbc/possible retacrit x4 #  Follow up in 4 months-- MD ;labs-cbc/bmp;iron-studies/ferritin possible SQ retacrit OR venofer- Dr.B    All questions were answered. The patient knows to call the clinic with any problems, questions or concerns.    Earna Coder, MD 11/20/2022 1:50 PM

## 2022-11-22 DIAGNOSIS — X32XXXA Exposure to sunlight, initial encounter: Secondary | ICD-10-CM | POA: Diagnosis not present

## 2022-11-22 DIAGNOSIS — L570 Actinic keratosis: Secondary | ICD-10-CM | POA: Diagnosis not present

## 2022-11-22 DIAGNOSIS — C44311 Basal cell carcinoma of skin of nose: Secondary | ICD-10-CM | POA: Diagnosis not present

## 2022-11-28 DIAGNOSIS — E039 Hypothyroidism, unspecified: Secondary | ICD-10-CM | POA: Diagnosis not present

## 2022-11-28 DIAGNOSIS — E1122 Type 2 diabetes mellitus with diabetic chronic kidney disease: Secondary | ICD-10-CM | POA: Diagnosis not present

## 2022-11-28 DIAGNOSIS — E7800 Pure hypercholesterolemia, unspecified: Secondary | ICD-10-CM | POA: Diagnosis not present

## 2022-11-28 DIAGNOSIS — I10 Essential (primary) hypertension: Secondary | ICD-10-CM | POA: Diagnosis not present

## 2022-11-28 DIAGNOSIS — J449 Chronic obstructive pulmonary disease, unspecified: Secondary | ICD-10-CM | POA: Diagnosis not present

## 2022-11-28 DIAGNOSIS — N183 Chronic kidney disease, stage 3 unspecified: Secondary | ICD-10-CM | POA: Diagnosis not present

## 2022-12-05 DIAGNOSIS — E1122 Type 2 diabetes mellitus with diabetic chronic kidney disease: Secondary | ICD-10-CM | POA: Diagnosis not present

## 2022-12-05 DIAGNOSIS — N183 Chronic kidney disease, stage 3 unspecified: Secondary | ICD-10-CM | POA: Diagnosis not present

## 2022-12-05 DIAGNOSIS — E039 Hypothyroidism, unspecified: Secondary | ICD-10-CM | POA: Diagnosis not present

## 2022-12-05 DIAGNOSIS — M48062 Spinal stenosis, lumbar region with neurogenic claudication: Secondary | ICD-10-CM | POA: Diagnosis not present

## 2022-12-05 DIAGNOSIS — J449 Chronic obstructive pulmonary disease, unspecified: Secondary | ICD-10-CM | POA: Diagnosis not present

## 2022-12-05 DIAGNOSIS — I10 Essential (primary) hypertension: Secondary | ICD-10-CM | POA: Diagnosis not present

## 2022-12-05 DIAGNOSIS — E7800 Pure hypercholesterolemia, unspecified: Secondary | ICD-10-CM | POA: Diagnosis not present

## 2022-12-21 ENCOUNTER — Inpatient Hospital Stay: Payer: PPO | Attending: Internal Medicine

## 2022-12-21 ENCOUNTER — Inpatient Hospital Stay: Payer: PPO

## 2022-12-21 VITALS — BP 150/52 | HR 76

## 2022-12-21 DIAGNOSIS — N1832 Chronic kidney disease, stage 3b: Secondary | ICD-10-CM | POA: Diagnosis not present

## 2022-12-21 DIAGNOSIS — D631 Anemia in chronic kidney disease: Secondary | ICD-10-CM | POA: Diagnosis not present

## 2022-12-21 LAB — CBC WITH DIFFERENTIAL/PLATELET
Abs Immature Granulocytes: 0.06 10*3/uL (ref 0.00–0.07)
Basophils Absolute: 0 10*3/uL (ref 0.0–0.1)
Basophils Relative: 0 %
Eosinophils Absolute: 0.1 10*3/uL (ref 0.0–0.5)
Eosinophils Relative: 1 %
HCT: 31 % — ABNORMAL LOW (ref 36.0–46.0)
Hemoglobin: 9.1 g/dL — ABNORMAL LOW (ref 12.0–15.0)
Immature Granulocytes: 1 %
Lymphocytes Relative: 25 %
Lymphs Abs: 2.1 10*3/uL (ref 0.7–4.0)
MCH: 29.9 pg (ref 26.0–34.0)
MCHC: 29.4 g/dL — ABNORMAL LOW (ref 30.0–36.0)
MCV: 102 fL — ABNORMAL HIGH (ref 80.0–100.0)
Monocytes Absolute: 1 10*3/uL (ref 0.1–1.0)
Monocytes Relative: 11 %
Neutro Abs: 5.1 10*3/uL (ref 1.7–7.7)
Neutrophils Relative %: 62 %
Platelets: 218 10*3/uL (ref 150–400)
RBC: 3.04 MIL/uL — ABNORMAL LOW (ref 3.87–5.11)
RDW: 13 % (ref 11.5–15.5)
WBC: 8.4 10*3/uL (ref 4.0–10.5)
nRBC: 0 % (ref 0.0–0.2)

## 2022-12-21 MED ORDER — EPOETIN ALFA-EPBX 20000 UNIT/ML IJ SOLN
20000.0000 [IU] | Freq: Once | INTRAMUSCULAR | Status: AC
  Administered 2022-12-21: 20000 [IU] via SUBCUTANEOUS
  Filled 2022-12-21: qty 1

## 2023-01-19 ENCOUNTER — Inpatient Hospital Stay: Payer: PPO | Attending: Internal Medicine

## 2023-01-19 ENCOUNTER — Inpatient Hospital Stay: Payer: PPO

## 2023-01-19 VITALS — BP 174/52

## 2023-01-19 DIAGNOSIS — N1832 Chronic kidney disease, stage 3b: Secondary | ICD-10-CM | POA: Diagnosis not present

## 2023-01-19 DIAGNOSIS — D631 Anemia in chronic kidney disease: Secondary | ICD-10-CM | POA: Diagnosis not present

## 2023-01-19 LAB — CBC WITH DIFFERENTIAL/PLATELET
Abs Immature Granulocytes: 0.03 10*3/uL (ref 0.00–0.07)
Basophils Absolute: 0 10*3/uL (ref 0.0–0.1)
Basophils Relative: 0 %
Eosinophils Absolute: 0.1 10*3/uL (ref 0.0–0.5)
Eosinophils Relative: 1 %
HCT: 32.1 % — ABNORMAL LOW (ref 36.0–46.0)
Hemoglobin: 9.7 g/dL — ABNORMAL LOW (ref 12.0–15.0)
Immature Granulocytes: 0 %
Lymphocytes Relative: 24 %
Lymphs Abs: 2.2 10*3/uL (ref 0.7–4.0)
MCH: 30.1 pg (ref 26.0–34.0)
MCHC: 30.2 g/dL (ref 30.0–36.0)
MCV: 99.7 fL (ref 80.0–100.0)
Monocytes Absolute: 1.2 10*3/uL — ABNORMAL HIGH (ref 0.1–1.0)
Monocytes Relative: 13 %
Neutro Abs: 5.6 10*3/uL (ref 1.7–7.7)
Neutrophils Relative %: 62 %
Platelets: 244 10*3/uL (ref 150–400)
RBC: 3.22 MIL/uL — ABNORMAL LOW (ref 3.87–5.11)
RDW: 12.9 % (ref 11.5–15.5)
WBC: 9.1 10*3/uL (ref 4.0–10.5)
nRBC: 0 % (ref 0.0–0.2)

## 2023-01-19 MED ORDER — EPOETIN ALFA-EPBX 20000 UNIT/ML IJ SOLN
20000.0000 [IU] | Freq: Once | INTRAMUSCULAR | Status: AC
  Administered 2023-01-19: 20000 [IU] via SUBCUTANEOUS
  Filled 2023-01-19: qty 1

## 2023-01-19 NOTE — Progress Notes (Signed)
BP 174/52 rechecked. Per Dr. Smith Robert MD ok to give retacrit. Patient reports no symptoms. Given discharged, stable

## 2023-01-31 DIAGNOSIS — L820 Inflamed seborrheic keratosis: Secondary | ICD-10-CM | POA: Diagnosis not present

## 2023-01-31 DIAGNOSIS — Z85828 Personal history of other malignant neoplasm of skin: Secondary | ICD-10-CM | POA: Diagnosis not present

## 2023-01-31 DIAGNOSIS — Z08 Encounter for follow-up examination after completed treatment for malignant neoplasm: Secondary | ICD-10-CM | POA: Diagnosis not present

## 2023-01-31 DIAGNOSIS — L578 Other skin changes due to chronic exposure to nonionizing radiation: Secondary | ICD-10-CM | POA: Diagnosis not present

## 2023-01-31 DIAGNOSIS — L821 Other seborrheic keratosis: Secondary | ICD-10-CM | POA: Diagnosis not present

## 2023-01-31 DIAGNOSIS — L570 Actinic keratosis: Secondary | ICD-10-CM | POA: Diagnosis not present

## 2023-02-20 ENCOUNTER — Inpatient Hospital Stay: Payer: PPO

## 2023-02-20 ENCOUNTER — Inpatient Hospital Stay: Payer: PPO | Attending: Internal Medicine

## 2023-02-20 VITALS — BP 163/52

## 2023-02-20 DIAGNOSIS — D631 Anemia in chronic kidney disease: Secondary | ICD-10-CM | POA: Insufficient documentation

## 2023-02-20 DIAGNOSIS — N1832 Chronic kidney disease, stage 3b: Secondary | ICD-10-CM | POA: Diagnosis not present

## 2023-02-20 LAB — CBC WITH DIFFERENTIAL/PLATELET
Abs Immature Granulocytes: 0.03 10*3/uL (ref 0.00–0.07)
Basophils Absolute: 0 10*3/uL (ref 0.0–0.1)
Basophils Relative: 0 %
Eosinophils Absolute: 0.2 10*3/uL (ref 0.0–0.5)
Eosinophils Relative: 2 %
HCT: 32.3 % — ABNORMAL LOW (ref 36.0–46.0)
Hemoglobin: 9.6 g/dL — ABNORMAL LOW (ref 12.0–15.0)
Immature Granulocytes: 0 %
Lymphocytes Relative: 25 %
Lymphs Abs: 1.9 10*3/uL (ref 0.7–4.0)
MCH: 29.8 pg (ref 26.0–34.0)
MCHC: 29.7 g/dL — ABNORMAL LOW (ref 30.0–36.0)
MCV: 100.3 fL — ABNORMAL HIGH (ref 80.0–100.0)
Monocytes Absolute: 0.9 10*3/uL (ref 0.1–1.0)
Monocytes Relative: 12 %
Neutro Abs: 4.4 10*3/uL (ref 1.7–7.7)
Neutrophils Relative %: 61 %
Platelets: 216 10*3/uL (ref 150–400)
RBC: 3.22 MIL/uL — ABNORMAL LOW (ref 3.87–5.11)
RDW: 13.1 % (ref 11.5–15.5)
WBC: 7.3 10*3/uL (ref 4.0–10.5)
nRBC: 0 % (ref 0.0–0.2)

## 2023-02-20 MED ORDER — EPOETIN ALFA-EPBX 20000 UNIT/ML IJ SOLN
20000.0000 [IU] | Freq: Once | INTRAMUSCULAR | Status: AC
  Administered 2023-02-20: 20000 [IU] via SUBCUTANEOUS
  Filled 2023-02-20: qty 1

## 2023-02-26 DIAGNOSIS — D3132 Benign neoplasm of left choroid: Secondary | ICD-10-CM | POA: Diagnosis not present

## 2023-02-26 DIAGNOSIS — E119 Type 2 diabetes mellitus without complications: Secondary | ICD-10-CM | POA: Diagnosis not present

## 2023-02-26 DIAGNOSIS — H401131 Primary open-angle glaucoma, bilateral, mild stage: Secondary | ICD-10-CM | POA: Diagnosis not present

## 2023-02-26 DIAGNOSIS — H353290 Exudative age-related macular degeneration, unspecified eye, stage unspecified: Secondary | ICD-10-CM | POA: Diagnosis not present

## 2023-03-23 ENCOUNTER — Inpatient Hospital Stay: Payer: PPO | Attending: Internal Medicine

## 2023-03-23 ENCOUNTER — Inpatient Hospital Stay: Payer: PPO

## 2023-03-23 ENCOUNTER — Inpatient Hospital Stay (HOSPITAL_BASED_OUTPATIENT_CLINIC_OR_DEPARTMENT_OTHER): Payer: PPO | Admitting: Internal Medicine

## 2023-03-23 VITALS — BP 152/55 | HR 63

## 2023-03-23 DIAGNOSIS — D631 Anemia in chronic kidney disease: Secondary | ICD-10-CM | POA: Insufficient documentation

## 2023-03-23 DIAGNOSIS — N1832 Chronic kidney disease, stage 3b: Secondary | ICD-10-CM

## 2023-03-23 LAB — BASIC METABOLIC PANEL - CANCER CENTER ONLY
Anion gap: 8 (ref 5–15)
BUN: 51 mg/dL — ABNORMAL HIGH (ref 8–23)
CO2: 23 mmol/L (ref 22–32)
Calcium: 9 mg/dL (ref 8.9–10.3)
Chloride: 107 mmol/L (ref 98–111)
Creatinine: 1.32 mg/dL — ABNORMAL HIGH (ref 0.44–1.00)
GFR, Estimated: 38 mL/min — ABNORMAL LOW (ref >60)
Glucose, Bld: 170 mg/dL — ABNORMAL HIGH (ref 70–99)
Potassium: 4.4 mmol/L (ref 3.5–5.1)
Sodium: 138 mmol/L (ref 135–145)

## 2023-03-23 LAB — CBC WITH DIFFERENTIAL (CANCER CENTER ONLY)
Abs Immature Granulocytes: 0.02 10*3/uL (ref 0.00–0.07)
Basophils Absolute: 0 10*3/uL (ref 0.0–0.1)
Basophils Relative: 0 %
Eosinophils Absolute: 0.1 10*3/uL (ref 0.0–0.5)
Eosinophils Relative: 2 %
HCT: 31.8 % — ABNORMAL LOW (ref 36.0–46.0)
Hemoglobin: 9.3 g/dL — ABNORMAL LOW (ref 12.0–15.0)
Immature Granulocytes: 0 %
Lymphocytes Relative: 30 %
Lymphs Abs: 1.9 10*3/uL (ref 0.7–4.0)
MCH: 30 pg (ref 26.0–34.0)
MCHC: 29.2 g/dL — ABNORMAL LOW (ref 30.0–36.0)
MCV: 102.6 fL — ABNORMAL HIGH (ref 80.0–100.0)
Monocytes Absolute: 0.8 10*3/uL (ref 0.1–1.0)
Monocytes Relative: 12 %
Neutro Abs: 3.5 10*3/uL (ref 1.7–7.7)
Neutrophils Relative %: 56 %
Platelet Count: 214 10*3/uL (ref 150–400)
RBC: 3.1 MIL/uL — ABNORMAL LOW (ref 3.87–5.11)
RDW: 13.1 % (ref 11.5–15.5)
WBC Count: 6.3 10*3/uL (ref 4.0–10.5)
nRBC: 0 % (ref 0.0–0.2)

## 2023-03-23 LAB — IRON AND TIBC
Iron: 71 ug/dL (ref 28–170)
Saturation Ratios: 31 % (ref 10.4–31.8)
TIBC: 227 ug/dL — ABNORMAL LOW (ref 250–450)
UIBC: 156 ug/dL

## 2023-03-23 LAB — FERRITIN: Ferritin: 287 ng/mL (ref 11–307)

## 2023-03-23 MED ORDER — SODIUM CHLORIDE 0.9 % IV SOLN
200.0000 mg | Freq: Once | INTRAVENOUS | Status: AC
  Administered 2023-03-23: 200 mg via INTRAVENOUS
  Filled 2023-03-23: qty 200

## 2023-03-23 MED ORDER — SODIUM CHLORIDE 0.9 % IV SOLN
Freq: Once | INTRAVENOUS | Status: AC
  Filled 2023-03-23: qty 250

## 2023-03-23 NOTE — Progress Notes (Signed)
[facility] CONSULT NOTE  Patient Care Team: Lauro Regulus, MD as PCP - General (Internal Medicine) Dingeldein, Viviann Spare, MD as Consulting Physician (Ophthalmology) Earna Coder, MD as Consulting Physician (Hematology and Oncology)  CHIEF COMPLAINTS/PURPOSE OF CONSULTATION: Anemia   HEMATOLOGY HISTORY  #Anemia CKD-III Robyn Butler 2021- Hb 8-9;[name];  Ferritin-100; PCP] [EGD/; colonoscopy-[facility];2016 ; capsule-]; iron saturation 20% ferritin-68; no hemolysis; on retacrit q monthly.   # CKD-III [GFR40s]  # COPD   HISTORY OF PRESENTING ILLNESS: Ambulating independently.  Accompanied by daughter  Robyn Butler 89 y.o.  female anemia/chronic kidney disease is here for follow-up.  Patient has been having these episodes for about weeks, to where all of a sudden with out warning she will feel like a jerk after a motion and just fall, unless legs lock up. Last episode 2-3 weeks ago.    Fatigue/weakness: not worsening.  Dyspena: no Light headedness: sometimes Blood in stool: no  No blood in stools or black-colored stools.  No nausea no vomiting.  Review of Systems  Constitutional:  Positive for malaise/fatigue. Negative for chills, diaphoresis, fever and weight loss.  HENT:  Negative for nosebleeds and sore throat.   Eyes:  Negative for double vision.  Respiratory:  Negative for hemoptysis, sputum production and wheezing.   Cardiovascular:  Negative for chest pain, palpitations, orthopnea and leg swelling.  Gastrointestinal:  Negative for abdominal pain, blood in stool, constipation, diarrhea, heartburn, melena, nausea and vomiting.  Genitourinary:  Negative for dysuria, frequency and urgency.  Musculoskeletal:  Positive for back pain and joint pain.  Skin: Negative.  Negative for itching and rash.  Neurological:  Negative for dizziness, tingling, focal weakness, weakness and headaches.  Endo/Heme/Allergies:  Does not bruise/bleed easily.   Psychiatric/Behavioral:  Negative for depression. The patient is not nervous/anxious and does not have insomnia.     MEDICAL HISTORY:  Past Medical History:  Diagnosis Date   Arthritis    Cancer (HCC)    Chronic kidney disease    COPD (chronic obstructive pulmonary disease) (HCC)    Diabetes mellitus without complication (HCC)    type 2   Hypertension    Varicose veins of lower extremities with other complications     SURGICAL HISTORY: Past Surgical History:  Procedure Laterality Date   ABDOMINAL HYSTERECTOMY  1972   BLADDER SUSPENSION     BREAST EXCISIONAL BIOPSY Right 1999   neg   CHOLECYSTECTOMY  1975   COLONOSCOPY     COLONOSCOPY WITH PROPOFOL N/A 05/24/2015   Procedure: COLONOSCOPY WITH PROPOFOL;  Surgeon: Wallace Cullens, MD;  Location: [facility] ENDOSCOPY;  Service: Gastroenterology;  Laterality: N/A;   CYSTOSCOPY  1972   ESOPHAGOGASTRODUODENOSCOPY N/A 05/24/2015   Procedure: ESOPHAGOGASTRODUODENOSCOPY (EGD);  Surgeon: Wallace Cullens, MD;  Location: [facility] ENDOSCOPY;  Service: Gastroenterology;  Laterality: N/A;   FRACTURE SURGERY     JOINT REPLACEMENT Right 2013   hip   salpingo oophorectmy      THYROIDECTOMY  1969   WRIST FRACTURE SURGERY Left     SOCIAL HISTORY: Social History   Socioeconomic History   Marital status: Widowed    Spouse name: Not on file   Number of children: 2   Years of education: 9th Grade   Highest education level: 9th grade  Occupational History   Occupation: Retired  Tobacco Use   Smoking status: Former    Current packs/day: 0.00    Types: Cigarettes    Quit date: 07/18/1991    Years since quitting: 31.7  Smokeless tobacco: Never  Vaping Use   Vaping status: Never Used  Substance and Sexual Activity   Alcohol use: No   Drug use: No   Sexual activity: Not Currently  Other Topics Concern   Not on file  Social History Narrative   In [place] in senior place; quit smoking [> 25 years ago]; no alcohol; hosiery.    Social Determinants of  Health   Financial Resource Strain: Low Risk  (12/21/2017)   Overall Financial Resource Strain (CARDIA)    Difficulty of Paying Living Expenses: Not hard at all  Food Insecurity: No Food Insecurity (12/21/2017)   Hunger Vital Sign    Worried About Running Out of Food in the Last Year: Never true    Ran Out of Food in the Last Year: Never true  Transportation Needs: No Transportation Needs (12/21/2017)   PRAPARE - Administrator, Civil Service (Medical): No    Lack of Transportation (Non-Medical): No  Physical Activity: Inactive (12/24/2018)   Exercise Vital Sign    Days of Exercise per Week: 0 days    Minutes of Exercise per Session: 0 min  Stress: No Stress Concern Present (12/21/2017)   [employer] of Occupational Health - Occupational Stress Questionnaire    Feeling of Stress : Not at all  Social Connections: Unknown (12/24/2018)   Social Connection and Isolation Panel [NHANES]    Frequency of Communication with Friends and Family: Patient declined    Frequency of Social Gatherings with Friends and Family: Patient declined    Attends Religious Services: Patient declined    Database administrator or Organizations: Patient declined    Attends Banker Meetings: Patient declined    Marital Status: Patient declined  Intimate Partner Violence: Unknown (12/24/2018)   Humiliation, Afraid, Rape, and Kick questionnaire    Fear of Current or Ex-Partner: Patient declined    Emotionally Abused: Patient declined    Physically Abused: Patient declined    Sexually Abused: Patient declined    FAMILY HISTORY: Family History  Problem Relation Age of Onset   Heart attack Father    Asthma Father    Kidney disease Mother    Hypertension Mother    Hypertension Other    Diabetes Other    Heart attack Other    Breast cancer Neg Hx     ALLERGIES:  is allergic to alendronate.  MEDICATIONS:  Current Outpatient Medications  Medication Sig Dispense Refill   albuterol  (VENTOLIN HFA) 108 (90 Base) MCG/ACT inhaler Inhale 2 puffs into the lungs every 6 (six) hours as needed for wheezing or shortness of breath. 8 g 0   amLODipine (NORVASC) 5 MG tablet TAKE ONE(1) TABLET EACH DAY (Patient taking differently: Take 5 mg by mouth daily.) 90 tablet 4   aspirin EC 81 MG tablet Take 81 mg by mouth daily.      Cholecalciferol (VITAMIN D3) 50 MCG (2000 UT) TABS Take 1 tablet by mouth daily.     Cranberry 500 MG CAPS Take 500 mg by mouth daily.     cyanocobalamin 1000 MCG tablet Take 1,000 mcg by mouth daily.     dorzolamide (TRUSOPT) 2 % ophthalmic solution Place 1 drop into both eyes 2 (two) times daily.      ibandronate (BONIVA) 150 MG tablet TAKE ONE TABLET ONCE A MONTH FIRST THING IN THE MORNING AT LEAST 1 HOUR BEFORE EATING, TAKE WITH WATER. 1 tablet 12   latanoprost (XALATAN) 0.005 % ophthalmic solution Place 1  drop into both eyes at bedtime.      levothyroxine (SYNTHROID) 100 MCG tablet TAKE 1 TABLET BY MOUTH EVERY DAY (Patient taking differently: Take 100 mcg by mouth daily before breakfast.) 90 tablet 3   loperamide (IMODIUM) 2 MG capsule Take 2 mg by mouth as needed for diarrhea or loose stools.     lovastatin (MEVACOR) 40 MG tablet TAKE 1 TABLET BY MOUTH AT BEDTIME 90 tablet 0   magnesium oxide (MAG-OX) 400 MG tablet Take 400 mg by mouth daily.     Multiple Vitamins-Minerals (PRESERVISION AREDS 2) CAPS Take 1 capsule by mouth 2 (two) times daily.     nortriptyline (PAMELOR) 25 MG capsule TAKE 1 CAPSULE(25 MG) BY MOUTH AT BEDTIME 90 capsule 0   omeprazole (PRILOSEC) 40 MG capsule Take 1 capsule (40 mg total) by mouth daily. 90 capsule 4   Polyethylene Glycol 3350 (DULCOLAX BALANCE PO) Take 2 tablets by mouth as needed.     SPIRIVA RESPIMAT 2.5 MCG/ACT AERS SMARTSIG:2 Puff(s) By Mouth Daily     valsartan-hydrochlorothiazide (DIOVAN-HCT) 160-25 MG tablet Take 1 tablet by mouth daily. 90 tablet 4   zinc sulfate 220 (50 Zn) MG capsule Take 220 mg by mouth daily.      gabapentin (NEURONTIN) 100 MG capsule Take 1 capsule by mouth 2 (two) times daily.     No current facility-administered medications for this visit.   Facility-Administered Medications Ordered in Other Visits  Medication Dose Route Frequency Provider Last Rate Last Admin   iron sucrose (VENOFER) 200 mg in sodium chloride 0.9 % 100 mL IVPB  200 mg Intravenous Once Louretta Shorten R, MD          PHYSICAL EXAMINATION:   Vitals:   03/23/23 1324  BP: 128/68  Pulse: 72  Temp: (!) 96.5 F (35.8 C)  SpO2: 100%   Filed Weights   03/23/23 1324  Weight: 123 lb (55.8 kg)    Physical Exam HENT:     Head: Normocephalic and atraumatic.     Mouth/Throat:     Pharynx: No oropharyngeal exudate.  Eyes:     Pupils: Pupils are equal, round, and reactive to light.  Cardiovascular:     Rate and Rhythm: Normal rate and regular rhythm.  Pulmonary:     Effort: No respiratory distress.     Breath sounds: No wheezing.  Abdominal:     General: Bowel sounds are normal. There is no distension.     Palpations: Abdomen is soft. There is no mass.     Tenderness: There is no abdominal tenderness. There is no guarding or rebound.  Musculoskeletal:        General: No tenderness. Normal range of motion.     Cervical back: Normal range of motion and neck supple.  Skin:    General: Skin is warm.  Neurological:     Mental Status: She is alert and oriented to person, place, and time.  Psychiatric:        Mood and Affect: Affect normal.     LABORATORY DATA:  I have reviewed the data as listed Lab Results  Component Value Date   WBC 6.3 03/23/2023   HGB 9.3 (L) 03/23/2023   HCT 31.8 (L) 03/23/2023   MCV 102.6 (H) 03/23/2023   PLT 214 03/23/2023   Recent Labs    07/21/22 1305 10/20/22 1326 03/23/23 1304  NA 142 136 138  K 4.3 4.1 4.4  CL 109 104 107  CO2 24 23 23   GLUCOSE 150*  122* 170*  BUN 30* 39* 51*  CREATININE 1.05* 1.07* 1.32*  CALCIUM 8.5* 8.6* 9.0  GFRNONAA 50* 49* 38*      No results found.  Anemia due to stage 3a chronic kidney disease (HCC) #Anemia-secondary chronic kidney disease stage III/iron deficiency [March 2021-hb-8-9].  Currently on IV iron infusion/Retacrit.stable.   # Today hb-9.7- on  retacrit/venofer; again reviwed the need for ongoing retacrit for the rest of her life.  APRIL 2024- Iron sat- 27 &; ferritin-303- continue PO iron pills- gentle iron. Proceed with venofer.   # Sudden jerking movements/ Hx of falls- ? Seizures vs other- defer to PCP re: further work up.   DISPOSITION:  # HOLD with Retacrit today;  proceed  IV venofer today # monthly cbc/possible retacrit x4 #  Follow up in 4 months-- MD ;labs-cbc/bmp;iron-studies/ferritin possible SQ retacrit OR venofer- Dr.B  All questions were answered. The patient knows to call the clinic with any problems, questions or concerns.    Earna Coder, MD 03/23/2023 2:48 PM

## 2023-03-23 NOTE — Assessment & Plan Note (Addendum)
#  Anemia-secondary chronic kidney disease stage III/iron deficiency [March 2021-hb-8-9].  Currently on IV iron infusion/Retacrit.stable.   # Today hb-9.7- on  retacrit/venofer; again reviwed the need for ongoing retacrit for the rest of her life.  APRIL 2024- Iron sat- 27 &; ferritin-303- continue PO iron pills- gentle iron. Proceed with venofer.   # Sudden jerking movements/ Hx of falls- ? Seizures vs other- defer to PCP re: further work up.   DISPOSITION:  # HOLD with Retacrit today;  proceed  IV venofer today # monthly cbc/possible retacrit x4 #  Follow up in 4 months-- MD ;labs-cbc/bmp;iron-studies/ferritin possible SQ retacrit OR venofer- Dr.B

## 2023-03-23 NOTE — Progress Notes (Signed)
Patient has been having these episodes for about weeks, to where all of a sudden with out warning she will feel like a jerk after a motion and just fall, unless legs lock up.

## 2023-03-23 NOTE — Patient Instructions (Signed)
Iron Sucrose Injection What is this medication? IRON SUCROSE (EYE ern SOO krose) treats low levels of iron (iron deficiency anemia) in people with kidney disease. Iron is a mineral that plays an important role in making red blood cells, which carry oxygen from your lungs to the rest of your body. This medicine may be used for other purposes; ask your health care provider or pharmacist if you have questions. COMMON BRAND NAME(S): Venofer What should I tell my care team before I take this medication? They need to know if you have any of these conditions: Anemia not caused by low iron levels Heart disease High levels of iron in the blood Kidney disease Liver disease An unusual or allergic reaction to iron, other medications, foods, dyes, or preservatives Pregnant or trying to get pregnant Breastfeeding How should I use this medication? This medication is for infusion into a vein. It is given in a hospital or clinic setting. Talk to your care team about the use of this medication in children. While this medication may be prescribed for children as young as 2 years for selected conditions, precautions do apply. Overdosage: If you think you have taken too much of this medicine contact a poison control center or emergency room at once. NOTE: This medicine is only for you. Do not share this medicine with others. What if I miss a dose? Keep appointments for follow-up doses. It is important not to miss your dose. Call your care team if you are unable to keep an appointment. What may interact with this medication? Do not take this medication with any of the following: Deferoxamine Dimercaprol Other iron products This medication may also interact with the following: Chloramphenicol Deferasirox This list may not describe all possible interactions. Give your health care provider a list of all the medicines, herbs, non-prescription drugs, or dietary supplements you use. Also tell them if you smoke,  drink alcohol, or use illegal drugs. Some items may interact with your medicine. What should I watch for while using this medication? Visit your care team regularly. Tell your care team if your symptoms do not start to get better or if they get worse. You may need blood work done while you are taking this medication. You may need to follow a special diet. Talk to your care team. Foods that contain iron include: whole grains/cereals, dried fruits, beans, or peas, leafy green vegetables, and organ meats (liver, kidney). What side effects may I notice from receiving this medication? Side effects that you should report to your care team as soon as possible: Allergic reactions--skin rash, itching, hives, swelling of the face, lips, tongue, or throat Low blood pressure--dizziness, feeling faint or lightheaded, blurry vision Shortness of breath Side effects that usually do not require medical attention (report to your care team if they continue or are bothersome): Flushing Headache Joint pain Muscle pain Nausea Pain, redness, or irritation at injection site This list may not describe all possible side effects. Call your doctor for medical advice about side effects. You may report side effects to FDA at 1-800-FDA-1088. Where should I keep my medication? This medication is given in a hospital or clinic. It will not be stored at home. NOTE: This sheet is a summary. It may not cover all possible information. If you have questions about this medicine, talk to your doctor, pharmacist, or health care provider.  2024 Elsevier/Gold Standard (2022-12-08 00:00:00)

## 2023-04-23 ENCOUNTER — Inpatient Hospital Stay: Payer: PPO

## 2023-04-23 ENCOUNTER — Inpatient Hospital Stay: Payer: PPO | Attending: Internal Medicine

## 2023-04-23 VITALS — BP 136/47 | HR 72

## 2023-04-23 DIAGNOSIS — D631 Anemia in chronic kidney disease: Secondary | ICD-10-CM

## 2023-04-23 DIAGNOSIS — N1832 Chronic kidney disease, stage 3b: Secondary | ICD-10-CM | POA: Insufficient documentation

## 2023-04-23 LAB — CBC WITH DIFFERENTIAL (CANCER CENTER ONLY)
Abs Immature Granulocytes: 0.02 10*3/uL (ref 0.00–0.07)
Basophils Absolute: 0 10*3/uL (ref 0.0–0.1)
Basophils Relative: 0 %
Eosinophils Absolute: 0.1 10*3/uL (ref 0.0–0.5)
Eosinophils Relative: 1 %
HCT: 31.1 % — ABNORMAL LOW (ref 36.0–46.0)
Hemoglobin: 9.3 g/dL — ABNORMAL LOW (ref 12.0–15.0)
Immature Granulocytes: 0 %
Lymphocytes Relative: 26 %
Lymphs Abs: 1.8 10*3/uL (ref 0.7–4.0)
MCH: 30.5 pg (ref 26.0–34.0)
MCHC: 29.9 g/dL — ABNORMAL LOW (ref 30.0–36.0)
MCV: 102 fL — ABNORMAL HIGH (ref 80.0–100.0)
Monocytes Absolute: 0.6 10*3/uL (ref 0.1–1.0)
Monocytes Relative: 9 %
Neutro Abs: 4.2 10*3/uL (ref 1.7–7.7)
Neutrophils Relative %: 64 %
Platelet Count: 194 10*3/uL (ref 150–400)
RBC: 3.05 MIL/uL — ABNORMAL LOW (ref 3.87–5.11)
RDW: 13.2 % (ref 11.5–15.5)
WBC Count: 6.7 10*3/uL (ref 4.0–10.5)
nRBC: 0 % (ref 0.0–0.2)

## 2023-04-23 MED ORDER — EPOETIN ALFA-EPBX 20000 UNIT/ML IJ SOLN
20000.0000 [IU] | Freq: Once | INTRAMUSCULAR | Status: AC
  Administered 2023-04-23: 20000 [IU] via SUBCUTANEOUS
  Filled 2023-04-23: qty 1

## 2023-05-03 ENCOUNTER — Ambulatory Visit: Payer: PPO | Admitting: Dermatology

## 2023-05-21 DIAGNOSIS — R42 Dizziness and giddiness: Secondary | ICD-10-CM | POA: Diagnosis not present

## 2023-05-23 ENCOUNTER — Inpatient Hospital Stay: Payer: PPO

## 2023-05-23 ENCOUNTER — Inpatient Hospital Stay: Payer: PPO | Attending: Internal Medicine

## 2023-05-23 VITALS — BP 128/60

## 2023-05-23 DIAGNOSIS — N1832 Chronic kidney disease, stage 3b: Secondary | ICD-10-CM | POA: Insufficient documentation

## 2023-05-23 DIAGNOSIS — D631 Anemia in chronic kidney disease: Secondary | ICD-10-CM | POA: Diagnosis not present

## 2023-05-23 LAB — CBC WITH DIFFERENTIAL (CANCER CENTER ONLY)
Abs Immature Granulocytes: 0.02 10*3/uL (ref 0.00–0.07)
Basophils Absolute: 0 10*3/uL (ref 0.0–0.1)
Basophils Relative: 0 %
Eosinophils Absolute: 0.1 10*3/uL (ref 0.0–0.5)
Eosinophils Relative: 3 %
HCT: 31.9 % — ABNORMAL LOW (ref 36.0–46.0)
Hemoglobin: 9.7 g/dL — ABNORMAL LOW (ref 12.0–15.0)
Immature Granulocytes: 0 %
Lymphocytes Relative: 33 %
Lymphs Abs: 1.7 10*3/uL (ref 0.7–4.0)
MCH: 30.9 pg (ref 26.0–34.0)
MCHC: 30.4 g/dL (ref 30.0–36.0)
MCV: 101.6 fL — ABNORMAL HIGH (ref 80.0–100.0)
Monocytes Absolute: 0.4 10*3/uL (ref 0.1–1.0)
Monocytes Relative: 8 %
Neutro Abs: 2.9 10*3/uL (ref 1.7–7.7)
Neutrophils Relative %: 56 %
Platelet Count: 263 10*3/uL (ref 150–400)
RBC: 3.14 MIL/uL — ABNORMAL LOW (ref 3.87–5.11)
RDW: 12.5 % (ref 11.5–15.5)
WBC Count: 5.3 10*3/uL (ref 4.0–10.5)
nRBC: 0 % (ref 0.0–0.2)

## 2023-05-23 MED ORDER — EPOETIN ALFA-EPBX 20000 UNIT/ML IJ SOLN
20000.0000 [IU] | Freq: Once | INTRAMUSCULAR | Status: AC
  Administered 2023-05-23: 20000 [IU] via SUBCUTANEOUS
  Filled 2023-05-23: qty 1

## 2023-05-28 DIAGNOSIS — R42 Dizziness and giddiness: Secondary | ICD-10-CM | POA: Diagnosis not present

## 2023-06-11 DIAGNOSIS — R531 Weakness: Secondary | ICD-10-CM | POA: Diagnosis not present

## 2023-06-11 DIAGNOSIS — E7800 Pure hypercholesterolemia, unspecified: Secondary | ICD-10-CM | POA: Diagnosis not present

## 2023-06-11 DIAGNOSIS — I10 Essential (primary) hypertension: Secondary | ICD-10-CM | POA: Diagnosis not present

## 2023-06-11 DIAGNOSIS — J449 Chronic obstructive pulmonary disease, unspecified: Secondary | ICD-10-CM | POA: Diagnosis not present

## 2023-06-11 DIAGNOSIS — E039 Hypothyroidism, unspecified: Secondary | ICD-10-CM | POA: Diagnosis not present

## 2023-06-11 DIAGNOSIS — Z23 Encounter for immunization: Secondary | ICD-10-CM | POA: Diagnosis not present

## 2023-06-11 DIAGNOSIS — E1122 Type 2 diabetes mellitus with diabetic chronic kidney disease: Secondary | ICD-10-CM | POA: Diagnosis not present

## 2023-06-11 DIAGNOSIS — Z0000 Encounter for general adult medical examination without abnormal findings: Secondary | ICD-10-CM | POA: Diagnosis not present

## 2023-06-11 DIAGNOSIS — N183 Chronic kidney disease, stage 3 unspecified: Secondary | ICD-10-CM | POA: Diagnosis not present

## 2023-06-22 ENCOUNTER — Inpatient Hospital Stay: Payer: PPO | Attending: Internal Medicine

## 2023-06-22 VITALS — BP 145/88

## 2023-06-22 DIAGNOSIS — N1832 Chronic kidney disease, stage 3b: Secondary | ICD-10-CM | POA: Diagnosis not present

## 2023-06-22 DIAGNOSIS — D631 Anemia in chronic kidney disease: Secondary | ICD-10-CM | POA: Insufficient documentation

## 2023-06-22 LAB — CBC WITH DIFFERENTIAL (CANCER CENTER ONLY)
Abs Immature Granulocytes: 0.04 10*3/uL (ref 0.00–0.07)
Basophils Absolute: 0 10*3/uL (ref 0.0–0.1)
Basophils Relative: 0 %
Eosinophils Absolute: 0.3 10*3/uL (ref 0.0–0.5)
Eosinophils Relative: 4 %
HCT: 32.5 % — ABNORMAL LOW (ref 36.0–46.0)
Hemoglobin: 9.7 g/dL — ABNORMAL LOW (ref 12.0–15.0)
Immature Granulocytes: 1 %
Lymphocytes Relative: 26 %
Lymphs Abs: 1.9 10*3/uL (ref 0.7–4.0)
MCH: 31 pg (ref 26.0–34.0)
MCHC: 29.8 g/dL — ABNORMAL LOW (ref 30.0–36.0)
MCV: 103.8 fL — ABNORMAL HIGH (ref 80.0–100.0)
Monocytes Absolute: 0.7 10*3/uL (ref 0.1–1.0)
Monocytes Relative: 10 %
Neutro Abs: 4.3 10*3/uL (ref 1.7–7.7)
Neutrophils Relative %: 59 %
Platelet Count: 209 10*3/uL (ref 150–400)
RBC: 3.13 MIL/uL — ABNORMAL LOW (ref 3.87–5.11)
RDW: 12.5 % (ref 11.5–15.5)
WBC Count: 7.3 10*3/uL (ref 4.0–10.5)
nRBC: 0 % (ref 0.0–0.2)

## 2023-06-22 MED ORDER — EPOETIN ALFA-EPBX 20000 UNIT/ML IJ SOLN
20000.0000 [IU] | Freq: Once | INTRAMUSCULAR | Status: AC
  Administered 2023-06-22: 20000 [IU] via SUBCUTANEOUS
  Filled 2023-06-22: qty 1

## 2023-07-17 DIAGNOSIS — M47816 Spondylosis without myelopathy or radiculopathy, lumbar region: Secondary | ICD-10-CM | POA: Diagnosis not present

## 2023-07-17 DIAGNOSIS — M5431 Sciatica, right side: Secondary | ICD-10-CM | POA: Diagnosis not present

## 2023-07-17 DIAGNOSIS — Z96641 Presence of right artificial hip joint: Secondary | ICD-10-CM | POA: Diagnosis not present

## 2023-07-17 DIAGNOSIS — M48061 Spinal stenosis, lumbar region without neurogenic claudication: Secondary | ICD-10-CM | POA: Diagnosis not present

## 2023-07-23 ENCOUNTER — Inpatient Hospital Stay (HOSPITAL_BASED_OUTPATIENT_CLINIC_OR_DEPARTMENT_OTHER): Payer: PPO | Admitting: Internal Medicine

## 2023-07-23 ENCOUNTER — Encounter: Payer: Self-pay | Admitting: Internal Medicine

## 2023-07-23 ENCOUNTER — Inpatient Hospital Stay: Payer: PPO | Attending: Internal Medicine

## 2023-07-23 ENCOUNTER — Inpatient Hospital Stay: Payer: PPO

## 2023-07-23 VITALS — BP 146/61 | HR 82 | Temp 98.5°F | Ht 62.0 in | Wt 117.6 lb

## 2023-07-23 DIAGNOSIS — D631 Anemia in chronic kidney disease: Secondary | ICD-10-CM

## 2023-07-23 DIAGNOSIS — J449 Chronic obstructive pulmonary disease, unspecified: Secondary | ICD-10-CM | POA: Insufficient documentation

## 2023-07-23 DIAGNOSIS — Z87891 Personal history of nicotine dependence: Secondary | ICD-10-CM | POA: Insufficient documentation

## 2023-07-23 DIAGNOSIS — N1832 Chronic kidney disease, stage 3b: Secondary | ICD-10-CM

## 2023-07-23 LAB — CBC WITH DIFFERENTIAL (CANCER CENTER ONLY)
Abs Immature Granulocytes: 0.17 10*3/uL — ABNORMAL HIGH (ref 0.00–0.07)
Basophils Absolute: 0 10*3/uL (ref 0.0–0.1)
Basophils Relative: 0 %
Eosinophils Absolute: 0 10*3/uL (ref 0.0–0.5)
Eosinophils Relative: 0 %
HCT: 33 % — ABNORMAL LOW (ref 36.0–46.0)
Hemoglobin: 9.8 g/dL — ABNORMAL LOW (ref 12.0–15.0)
Immature Granulocytes: 2 %
Lymphocytes Relative: 19 %
Lymphs Abs: 2.2 10*3/uL (ref 0.7–4.0)
MCH: 30.4 pg (ref 26.0–34.0)
MCHC: 29.7 g/dL — ABNORMAL LOW (ref 30.0–36.0)
MCV: 102.5 fL — ABNORMAL HIGH (ref 80.0–100.0)
Monocytes Absolute: 1.1 10*3/uL — ABNORMAL HIGH (ref 0.1–1.0)
Monocytes Relative: 10 %
Neutro Abs: 8.1 10*3/uL — ABNORMAL HIGH (ref 1.7–7.7)
Neutrophils Relative %: 69 %
Platelet Count: 236 10*3/uL (ref 150–400)
RBC: 3.22 MIL/uL — ABNORMAL LOW (ref 3.87–5.11)
RDW: 12.9 % (ref 11.5–15.5)
WBC Count: 11.6 10*3/uL — ABNORMAL HIGH (ref 4.0–10.5)
nRBC: 0.3 % — ABNORMAL HIGH (ref 0.0–0.2)

## 2023-07-23 LAB — FERRITIN: Ferritin: 396 ng/mL — ABNORMAL HIGH (ref 11–307)

## 2023-07-23 LAB — IRON AND TIBC
Iron: 96 ug/dL (ref 28–170)
Saturation Ratios: 42 % — ABNORMAL HIGH (ref 10.4–31.8)
TIBC: 231 ug/dL — ABNORMAL LOW (ref 250–450)
UIBC: 135 ug/dL

## 2023-07-23 LAB — BASIC METABOLIC PANEL
Anion gap: 7 (ref 5–15)
BUN: 33 mg/dL — ABNORMAL HIGH (ref 8–23)
CO2: 27 mmol/L (ref 22–32)
Calcium: 8.9 mg/dL (ref 8.9–10.3)
Chloride: 106 mmol/L (ref 98–111)
Creatinine, Ser: 1.11 mg/dL — ABNORMAL HIGH (ref 0.44–1.00)
GFR, Estimated: 47 mL/min — ABNORMAL LOW (ref >60)
Glucose, Bld: 99 mg/dL (ref 70–99)
Potassium: 4.5 mmol/L (ref 3.5–5.1)
Sodium: 140 mmol/L (ref 135–145)

## 2023-07-23 MED ORDER — EPOETIN ALFA-EPBX 20000 UNIT/ML IJ SOLN
20000.0000 [IU] | Freq: Once | INTRAMUSCULAR | Status: AC
  Administered 2023-07-23: 20000 [IU] via SUBCUTANEOUS
  Filled 2023-07-23: qty 1

## 2023-07-23 NOTE — Progress Notes (Signed)
 [facility] CONSULT NOTE  Patient Care Team: Lenon Layman ORN, MD as PCP - General (Internal Medicine) Dingeldein, Elspeth, MD as Consulting Physician (Ophthalmology) Rennie Cindy SAUNDERS, MD as Consulting Physician (Hematology and Oncology)  CHIEF COMPLAINTS/PURPOSE OF CONSULTATION: Anemia   HEMATOLOGY HISTORY  #Anemia CKD-III [name] 2021- Hb 8-9;[name];  Ferritin-100; PCP] [EGD/; colonoscopy-[facility];2016 ; capsule-]; iron  saturation 20% ferritin-68; no hemolysis; on retacrit  q monthly.   # CKD-III [GFR40s]  # COPD   HISTORY OF PRESENTING ILLNESS: Ambulating independently.  Accompanied by daughter  Robyn Butler 89 y.o.  female anemia/chronic kidney disease is here for follow-up.   C/o b/l leg pain rt>lt, 8/10 walking. Started on lyrica , finished prednisone . Had xray of back.   Occasionally has trouble swallowing solids.  No blood in stools or black-colored stools.  No nausea no vomiting.  Review of Systems  Constitutional:  Positive for malaise/fatigue. Negative for chills, diaphoresis, fever and weight loss.  HENT:  Negative for nosebleeds and sore throat.   Eyes:  Negative for double vision.  Respiratory:  Negative for hemoptysis, sputum production and wheezing.   Cardiovascular:  Negative for chest pain, palpitations, orthopnea and leg swelling.  Gastrointestinal:  Negative for abdominal pain, blood in stool, constipation, diarrhea, heartburn, melena, nausea and vomiting.  Genitourinary:  Negative for dysuria, frequency and urgency.  Musculoskeletal:  Positive for back pain and joint pain.  Skin: Negative.  Negative for itching and rash.  Neurological:  Negative for dizziness, tingling, focal weakness, weakness and headaches.  Endo/Heme/Allergies:  Does not bruise/bleed easily.  Psychiatric/Behavioral:  Negative for depression. The patient is not nervous/anxious and does not have insomnia.     MEDICAL HISTORY:  Past Medical History:   Diagnosis Date   Arthritis    Cancer (HCC)    Chronic kidney disease    COPD (chronic obstructive pulmonary disease) (HCC)    Diabetes mellitus without complication (HCC)    type 2   Hypertension    Varicose veins of lower extremities with other complications     SURGICAL HISTORY: Past Surgical History:  Procedure Laterality Date   ABDOMINAL HYSTERECTOMY  1972   BLADDER SUSPENSION     BREAST EXCISIONAL BIOPSY Right 1999   neg   CHOLECYSTECTOMY  1975   COLONOSCOPY     COLONOSCOPY WITH PROPOFOL  N/A 05/24/2015   Procedure: COLONOSCOPY WITH PROPOFOL ;  Surgeon: Deward CINDERELLA Piedmont, MD;  Location: [facility] ENDOSCOPY;  Service: Gastroenterology;  Laterality: N/A;   CYSTOSCOPY  1972   ESOPHAGOGASTRODUODENOSCOPY N/A 05/24/2015   Procedure: ESOPHAGOGASTRODUODENOSCOPY (EGD);  Surgeon: Deward CINDERELLA Piedmont, MD;  Location: [facility] ENDOSCOPY;  Service: Gastroenterology;  Laterality: N/A;   FRACTURE SURGERY     JOINT REPLACEMENT Right 2013   hip   salpingo oophorectmy      THYROIDECTOMY  1969   WRIST FRACTURE SURGERY Left     SOCIAL HISTORY: Social History   Socioeconomic History   Marital status: Widowed    Spouse name: Not on file   Number of children: 2   Years of education: 9th Grade   Highest education level: 9th grade  Occupational History   Occupation: Retired  Tobacco Use   Smoking status: Former    Current packs/day: 0.00    Types: Cigarettes    Quit date: 07/18/1991    Years since quitting: 32.0   Smokeless tobacco: Never  Vaping Use   Vaping status: Never Used  Substance and Sexual Activity   Alcohol use: No   Drug use: No  Sexual activity: Not Currently  Other Topics Concern   Not on file  Social History Narrative   In [place] in senior place; quit smoking [> 25 years ago]; no alcohol; hosiery.    Social Drivers of Corporate Investment Banker Strain: Low Risk  (06/11/2023)   Received from [facility]   Overall Financial Resource Strain (CARDIA)    Difficulty of  Paying Living Expenses: Not hard at all  Food Insecurity: No Food Insecurity (06/11/2023)   [facility]   Hunger Vital Sign    Worried About Running Out of Food in the Last Year: Never true    Ran Out of Food in the Last Year: Never true  Transportation Needs: No Transportation Needs (06/11/2023)   Received from [facility] - Transportation    In the past 12 months, has lack of transportation kept you from medical appointments or from getting medications?: No    Lack of Transportation (Non-Medical): No  Physical Activity: Inactive (12/24/2018)   Exercise Vital Sign    Days of Exercise per Week: 0 days    Minutes of Exercise per Session: 0 min  Stress: No Stress Concern Present (12/21/2017)   [employer] of Occupational [employer] - Occupational Stress Questionnaire    Feeling of Stress : Not at all  Social Connections: Unknown (12/24/2018)   Social Connection and Isolation Panel [NHANES]    Frequency of Communication with Friends and Family: Patient declined    Frequency of Social Gatherings with Friends and Family: Patient declined    Attends Religious Services: Patient declined    Database Administrator or Organizations: Patient declined    Attends Banker Meetings: Patient declined    Marital Status: Patient declined  Intimate Partner Violence: Unknown (12/24/2018)   Humiliation, Afraid, Rape, and Kick questionnaire    Fear of Current or Ex-Partner: Patient declined    Emotionally Abused: Patient declined    Physically Abused: Patient declined    Sexually Abused: Patient declined    FAMILY HISTORY: Family History  Problem Relation Age of Onset   Heart attack Father    Asthma Father    Kidney disease Mother    Hypertension Mother    Hypertension Other    Diabetes Other    Heart attack Other    Breast cancer Neg Hx     ALLERGIES:  is allergic to alendronate.  MEDICATIONS:  Current Outpatient  Medications  Medication Sig Dispense Refill   albuterol  (VENTOLIN  HFA) 108 (90 Base) MCG/ACT inhaler Inhale 2 puffs into the lungs every 6 (six) hours as needed for wheezing or shortness of breath. 8 g 0   amLODipine  (NORVASC ) 5 MG tablet TAKE ONE(1) TABLET EACH DAY (Patient taking differently: Take 5 mg by mouth daily.) 90 tablet 4   aspirin  EC 81 MG tablet Take 81 mg by mouth daily.      Cholecalciferol  (VITAMIN D3) 50 MCG (2000 UT) TABS Take 1 tablet by mouth daily.     Cranberry 500 MG CAPS Take 500 mg by mouth daily.     cyanocobalamin  1000 MCG tablet Take 1,000 mcg by mouth daily.     dorzolamide  (TRUSOPT ) 2 % ophthalmic solution Place 1 drop into both eyes 2 (two) times daily.      ibandronate  (BONIVA ) 150 MG tablet TAKE ONE TABLET ONCE A MONTH FIRST THING IN THE MORNING AT LEAST 1 HOUR BEFORE EATING, TAKE WITH WATER. 1 tablet 12  latanoprost  (XALATAN ) 0.005 % ophthalmic solution Place 1 drop into both eyes at bedtime.      levothyroxine  (SYNTHROID ) 100 MCG tablet TAKE 1 TABLET BY MOUTH EVERY DAY (Patient taking differently: Take 100 mcg by mouth daily before breakfast.) 90 tablet 3   loperamide  (IMODIUM ) 2 MG capsule Take 2 mg by mouth as needed for diarrhea or loose stools.     lovastatin (MEVACOR) 40 MG tablet TAKE 1 TABLET BY MOUTH AT BEDTIME 90 tablet 0   magnesium  oxide (MAG-OX) 400 MG tablet Take 400 mg by mouth daily.     Multiple Vitamins-Minerals (PRESERVISION AREDS 2) CAPS Take 1 capsule by mouth 2 (two) times daily.     nortriptyline  (PAMELOR ) 25 MG capsule TAKE 1 CAPSULE(25 MG) BY MOUTH AT BEDTIME 90 capsule 0   omeprazole  (PRILOSEC) 40 MG capsule Take 1 capsule (40 mg total) by mouth daily. 90 capsule 4   Polyethylene Glycol 3350  (DULCOLAX BALANCE PO) Take 2 tablets by mouth as needed.     pregabalin  (LYRICA ) 50 MG capsule Take 50 mg by mouth 2 (two) times daily.     SPIRIVA  RESPIMAT 2.5 MCG/ACT AERS SMARTSIG:2 Puff(s) By Mouth Daily     valsartan -hydrochlorothiazide   (DIOVAN -HCT) 160-25 MG tablet Take 1 tablet by mouth daily. 90 tablet 4   zinc sulfate 220 (50 Zn) MG capsule Take 220 mg by mouth daily.     No current facility-administered medications for this visit.      PHYSICAL EXAMINATION:   Vitals:   07/23/23 1521 07/23/23 1531  BP: (!) 160/75 (!) 146/61  Pulse: 82   Temp: 98.5 F (36.9 C)   SpO2: 97%     Filed Weights   07/23/23 1521  Weight: 117 lb 9.6 oz (53.3 kg)     Physical Exam HENT:     Head: Normocephalic and atraumatic.     Mouth/Throat:     Pharynx: No oropharyngeal exudate.  Eyes:     Pupils: Pupils are equal, round, and reactive to light.  Cardiovascular:     Rate and Rhythm: Normal rate and regular rhythm.  Pulmonary:     Effort: No respiratory distress.     Breath sounds: No wheezing.  Abdominal:     General: Bowel sounds are normal. There is no distension.     Palpations: Abdomen is soft. There is no mass.     Tenderness: There is no abdominal tenderness. There is no guarding or rebound.  Musculoskeletal:        General: No tenderness. Normal range of motion.     Cervical back: Normal range of motion and neck supple.  Skin:    General: Skin is warm.  Neurological:     Mental Status: She is alert and oriented to person, place, and time.  Psychiatric:        Mood and Affect: Affect normal.     LABORATORY DATA:  I have reviewed the data as listed Lab Results  Component Value Date   WBC 11.6 (H) 07/23/2023   HGB 9.8 (L) 07/23/2023   HCT 33.0 (L) 07/23/2023   MCV 102.5 (H) 07/23/2023   PLT 236 07/23/2023   Recent Labs    10/20/22 1326 03/23/23 1304 07/23/23 1512  NA 136 138 140  K 4.1 4.4 4.5  CL 104 107 106  CO2 23 23 27   GLUCOSE 122* 170* 99  BUN 39* 51* 33*  CREATININE 1.07* 1.32* 1.11*  CALCIUM  8.6* 9.0 8.9  GFRNONAA 49* 38* 47*  No results found.  Anemia due to stage 3a chronic kidney disease (HCC) #Anemia-secondary chronic kidney disease stage III/iron  deficiency [March  2021-hb-8-9].  Currently on IV iron  infusion/Retacrit .stable.   # Today hb-9.7- on  retacrit /venofer ; again reviwed the need for ongoing retacrit  for the rest of her life.  APRIL 2024- Iron  sat-31 &; ferritin-272- continue PO iron  pills- gentle iron . Proceed with Retacrit   # back pain s/p prednisone ; on lyrica - as per PCP.   DISPOSITION:  #  proceed with Retacrit  today;  HOLD  IV venofer  today # monthly cbc/possible retacrit  x4 # Follow up in 4 months-- MD ;labs-cbc/bmp;iron -studies/ferritin possible SQ retacrit  OR venofer - Dr.B  All questions were answered. The patient knows to call the clinic with any problems, questions or concerns.    Cindy JONELLE Joe, MD 07/23/2023 3:51 PM

## 2023-07-23 NOTE — Assessment & Plan Note (Addendum)
#  Anemia-secondary chronic kidney disease stage III/iron  deficiency [March 2021-hb-8-9].  Currently on IV iron  infusion/Retacrit .stable.   # Today hb-9.7- on  retacrit /venofer ; again reviwed the need for ongoing retacrit  for the rest of her life.  APRIL 2024- Iron  sat-31 &; ferritin-272- continue PO iron  pills- gentle iron . Proceed with Retacrit   # back pain s/p prednisone ; on lyrica - as per PCP.   DISPOSITION:  #  proceed with Retacrit  today;  HOLD  IV venofer  today # monthly cbc/possible retacrit  x4 # Follow up in 4 months-- MD ;labs-cbc/bmp;iron -studies/ferritin possible SQ retacrit  OR venofer - Dr.B

## 2023-07-23 NOTE — Progress Notes (Signed)
 Fatigue/weakness: yes Dyspena: yes Light headedness: no Blood in stool: no  C/o b/l leg pain rt>lt, 8/10 walking. Started on lyrica, finished prednisone. Had xray of back.  Occasionally has trouble swallowing solids.

## 2023-07-24 ENCOUNTER — Encounter: Payer: Self-pay | Admitting: Internal Medicine

## 2023-07-24 DIAGNOSIS — R531 Weakness: Secondary | ICD-10-CM | POA: Diagnosis not present

## 2023-08-17 ENCOUNTER — Emergency Department
Admission: EM | Admit: 2023-08-17 | Discharge: 2023-08-18 | Disposition: A | Discharge status: Home / Self Care | Payer: PPO | Attending: Emergency Medicine | Admitting: Emergency Medicine

## 2023-08-17 ENCOUNTER — Emergency Department: Payer: PPO

## 2023-08-17 ENCOUNTER — Other Ambulatory Visit: Payer: Self-pay

## 2023-08-17 DIAGNOSIS — J45909 Unspecified asthma, uncomplicated: Secondary | ICD-10-CM | POA: Insufficient documentation

## 2023-08-17 DIAGNOSIS — I700 Atherosclerosis of aorta: Secondary | ICD-10-CM | POA: Diagnosis not present

## 2023-08-17 DIAGNOSIS — E119 Type 2 diabetes mellitus without complications: Secondary | ICD-10-CM | POA: Insufficient documentation

## 2023-08-17 DIAGNOSIS — R079 Chest pain, unspecified: Secondary | ICD-10-CM | POA: Diagnosis not present

## 2023-08-17 DIAGNOSIS — J441 Chronic obstructive pulmonary disease with (acute) exacerbation: Secondary | ICD-10-CM | POA: Insufficient documentation

## 2023-08-17 DIAGNOSIS — R059 Cough, unspecified: Secondary | ICD-10-CM | POA: Diagnosis not present

## 2023-08-17 DIAGNOSIS — I10 Essential (primary) hypertension: Secondary | ICD-10-CM | POA: Diagnosis not present

## 2023-08-17 DIAGNOSIS — R052 Subacute cough: Secondary | ICD-10-CM

## 2023-08-17 DIAGNOSIS — M549 Dorsalgia, unspecified: Secondary | ICD-10-CM | POA: Diagnosis not present

## 2023-08-17 LAB — CBC
HCT: 32.7 % — ABNORMAL LOW (ref 36.0–46.0)
Hemoglobin: 9.8 g/dL — ABNORMAL LOW (ref 12.0–15.0)
MCH: 30.2 pg (ref 26.0–34.0)
MCHC: 30 g/dL (ref 30.0–36.0)
MCV: 100.6 fL — ABNORMAL HIGH (ref 80.0–100.0)
Platelets: 380 10*3/uL (ref 150–400)
RBC: 3.25 MIL/uL — ABNORMAL LOW (ref 3.87–5.11)
RDW: 12.7 % (ref 11.5–15.5)
WBC: 11.4 10*3/uL — ABNORMAL HIGH (ref 4.0–10.5)
nRBC: 0 % (ref 0.0–0.2)

## 2023-08-17 LAB — BASIC METABOLIC PANEL
Anion gap: 15 (ref 5–15)
BUN: 24 mg/dL — ABNORMAL HIGH (ref 8–23)
CO2: 21 mmol/L — ABNORMAL LOW (ref 22–32)
Calcium: 8.8 mg/dL — ABNORMAL LOW (ref 8.9–10.3)
Chloride: 101 mmol/L (ref 98–111)
Creatinine, Ser: 1.06 mg/dL — ABNORMAL HIGH (ref 0.44–1.00)
GFR, Estimated: 49 mL/min — ABNORMAL LOW (ref >60)
Glucose, Bld: 148 mg/dL — ABNORMAL HIGH (ref 70–99)
Potassium: 4.5 mmol/L (ref 3.5–5.1)
Sodium: 137 mmol/L (ref 135–145)

## 2023-08-17 NOTE — ED Triage Notes (Signed)
Refer to First Nurse Note

## 2023-08-17 NOTE — ED Provider Notes (Signed)
[facility] Provider Note    Event Date/Time   First MD Initiated Contact with Patient 08/17/23 2328     (approximate)   History   Cough and Back Pain   HPI  Robyn Butler is a 89 year old female with history of asthma, COPD, anemia, T2DM presenting to the emergency department for evaluation of cough.  For the past several days, patient has had ongoing cough.  Cough is productive of green sputum.  No fevers.  More recently, has noticed some upper back pain.  Using a lidocaine patch with limited benefit.  No falls.  No numbness, tingling, focal weakness.      Physical Exam   Triage Vital Signs: ED Triage Vitals  Encounter Vitals Group     BP 08/17/23 1836 (!) 152/61     Systolic BP Percentile --      Diastolic BP Percentile --      Pulse Rate 08/17/23 1836 93     Resp 08/17/23 1836 17     Temp 08/17/23 1836 98.4 F (36.9 C)     Temp Source 08/17/23 1836 Oral     SpO2 08/17/23 1836 95 %     Weight 08/17/23 1837 117 lb (53.1 kg)     Height 08/17/23 1837 5\' 3"  (1.6 m)     Head Circumference --      Peak Flow --      Pain Score 08/17/23 1836 6     Pain Loc --      Pain Education --      Exclude from Growth Chart --     Most recent vital signs: Vitals:   08/17/23 1836 08/18/23 0001  BP: (!) 152/61 (!) 180/78  Pulse: 93 80  Resp: 17 16  Temp: 98.4 F (36.9 C) 98.2 F (36.8 C)  SpO2: 95% 95%     General: Awake, interactive  CV:  Regular rate, good peripheral perfusion.  Resp:  Unlabored respirations, mildly coarse lung sounds throughout, no wheezing Abd:  Nondistended, soft, nontender to palpation Neuro:  Symmetric facial movement, fluid speech Back:  Tenderness to palpation over the mid back without focal area of point tenderness   ED Results / Procedures / Treatments   Labs (all labs ordered are listed, but only abnormal results are displayed) Labs Reviewed  CBC - Abnormal; Notable for the following components:      Result  Value   WBC 11.4 (*)    RBC 3.25 (*)    Hemoglobin 9.8 (*)    HCT 32.7 (*)    MCV 100.6 (*)    All other components within normal limits  BASIC METABOLIC PANEL - Abnormal; Notable for the following components:   CO2 21 (*)    Glucose, Bld 148 (*)    BUN 24 (*)    Creatinine, Ser 1.06 (*)    Calcium 8.8 (*)    GFR, Estimated 49 (*)    All other components within normal limits     EKG EKG independently reviewed interpreted by myself (ER attending) demonstrates:    RADIOLOGY Imaging independently reviewed and interpreted by myself demonstrates:  CXR without focal consolidation  PROCEDURES:  Critical Care performed: No  Procedures   MEDICATIONS ORDERED IN ED: Medications  HYDROcodone-acetaminophen (NORCO/VICODIN) 5-325 MG per tablet 0.5 tablet (has no administration in time range)     IMPRESSION / MDM / ASSESSMENT AND PLAN / ED COURSE  I reviewed the triage vital signs and the nursing notes.  Differential diagnosis  includes, but is not limited to, COPD exacerbation, viral illness, bacterial pneumonia, lower suspicion but consideration for PE, rib fracture  Patient's presentation is most consistent with acute presentation with potential threat to life or bodily function.  89 year old female presenting to the ER for evaluation of cough.  Stable vitals.  Labs with mild leukocytosis, stable anemia and renal dysfunction.  X-Tola Meas without appreciable pneumonia.  Does have reproducible back pain on exam.  I did discuss CT scan for further evaluation of occult pneumonia, rib fracture, pulmonary embolism.  However, patient did prefer to hold off on further diagnostics and instead wished to be discharged home with symptomatic treatment.  With her history of COPD, do think it would be reasonable to trial steroids and antibiotics for COPD exacerbation with sputum change.  She uses spiriva daily, will also will DC with rescue inhaler which she does not currently have.  For her back pain,  discussed risks of pain medication with age.  After discussion of risk and benefits, patient would like to trial low-dose opioid point medication as part of her multimodal pain control.  Family comfortable with this plan.  Strict return precautions provided.  Patient discharged in stable condition.     FINAL CLINICAL IMPRESSION(S) / ED DIAGNOSES   Final diagnoses:  Subacute cough  COPD exacerbation (HCC)     Rx / DC Orders   ED Discharge Orders          Ordered    HYDROcodone-acetaminophen (NORCO) 5-325 MG tablet  Every 6 hours PRN,   Status:  Discontinued        08/18/23 0002    predniSONE (DELTASONE) 20 MG tablet  Daily with breakfast,   Status:  Discontinued        08/18/23 0002    amoxicillin-clavulanate (AUGMENTIN) 875-125 MG tablet  2 times daily,   Status:  Discontinued        08/18/23 0002    predniSONE (DELTASONE) 20 MG tablet  Daily with breakfast        08/18/23 0003    HYDROcodone-acetaminophen (NORCO) 5-325 MG tablet  Every 6 hours PRN        08/18/23 0003    amoxicillin-clavulanate (AUGMENTIN) 875-125 MG tablet  2 times daily        08/18/23 0003    albuterol (VENTOLIN HFA) 108 (90 Base) MCG/ACT inhaler  Every 6 hours PRN        08/18/23 0004             Note:  This document was prepared using Dragon voice recognition software and may include unintentional dictation errors.   Trinna Post, MD 08/18/23 [phone]

## 2023-08-17 NOTE — ED Provider Notes (Incomplete)
[facility] Provider Note    Event Date/Time   First MD Initiated Contact with Patient 08/17/23 2328     (approximate)   History   Cough and Back Pain   HPI  Robyn Butler is a 89 year old female with history of asthma, COPD, anemia, T2DM presenting to the emergency department for evaluation of cough.  For the past several days, patient has had ongoing cough.  Cough is productive of green sputum.  No fevers.  More recently, has noticed some upper back pain.  Using a lidocaine patch with limited benefit.  No falls.  No numbness, tingling, focal weakness.      Physical Exam   Triage Vital Signs: ED Triage Vitals  Encounter Vitals Group     BP 08/17/23 1836 (!) 152/61     Systolic BP Percentile --      Diastolic BP Percentile --      Pulse Rate 08/17/23 1836 93     Resp 08/17/23 1836 17     Temp 08/17/23 1836 98.4 F (36.9 C)     Temp Source 08/17/23 1836 Oral     SpO2 08/17/23 1836 95 %     Weight 08/17/23 1837 117 lb (53.1 kg)     Height 08/17/23 1837 5\' 3"  (1.6 m)     Head Circumference --      Peak Flow --      Pain Score 08/17/23 1836 6     Pain Loc --      Pain Education --      Exclude from Growth Chart --     Most recent vital signs: Vitals:   08/17/23 1836  BP: (!) 152/61  Pulse: 93  Resp: 17  Temp: 98.4 F (36.9 C)  SpO2: 95%     General: Awake, interactive  CV:  Regular rate, good peripheral perfusion.  Resp:  Unlabored respirations, mildly coarse lung sounds throughout, no wheezing Abd:  Nondistended, soft, nontender to palpation Neuro:  Symmetric facial movement, fluid speech Back:  Tenderness to palpation over the mid back without focal area of point tenderness   ED Results / Procedures / Treatments   Labs (all labs ordered are listed, but only abnormal results are displayed) Labs Reviewed  CBC - Abnormal; Notable for the following components:      Result Value   WBC 11.4 (*)    RBC 3.25 (*)    Hemoglobin  9.8 (*)    HCT 32.7 (*)    MCV 100.6 (*)    All other components within normal limits  BASIC METABOLIC PANEL - Abnormal; Notable for the following components:   CO2 21 (*)    Glucose, Bld 148 (*)    BUN 24 (*)    Creatinine, Ser 1.06 (*)    Calcium 8.8 (*)    GFR, Estimated 49 (*)    All other components within normal limits     EKG EKG independently reviewed interpreted by myself (ER attending) demonstrates:    RADIOLOGY Imaging independently reviewed and interpreted by myself demonstrates:  CXR without focal consolidation  PROCEDURES:  Critical Care performed: No  Procedures   MEDICATIONS ORDERED IN ED: Medications - No data to display   IMPRESSION / MDM / ASSESSMENT AND PLAN / ED COURSE  I reviewed the triage vital signs and the nursing notes.  Differential diagnosis includes, but is not limited to, COPD exacerbation, viral illness, bacterial pneumonia, lower suspicion but consideration for PE, rib fracture  Patient's presentation  is most consistent with acute presentation with potential threat to life or bodily function.  89 year old female presenting to the ER for evaluation of cough.  Stable vitals.  Labs with mild leukocytosis, stable anemia and renal dysfunction.  X-Rosamaria Donn without appreciable pneumonia.  Does have reproducible back pain on exam.  I did discuss CT scan for further evaluation of occult pneumonia, rib fracture, pulmonary embolism.  However, patient did prefer to hold off on further diagnostics and instead wished to be discharged home with symptomatic treatment.  With her history of COPD, do think it would be reasonable to trial steroids and antibiotics for COPD exacerbation with sputum change.  She uses spiriva daily, will also will DC with rescue inhaler which she does not currently have.  For her back pain, discussed risks of pain medication with age.  After discussion of risk and benefits, patient would like to trial low-dose opioid point medication as  part of her multimodal pain control.  Family comfortable with this plan.  Strict return precautions provided.  Patient discharged in stable condition.     FINAL CLINICAL IMPRESSION(S) / ED DIAGNOSES   Final diagnoses:  None     Rx / DC Orders   ED Discharge Orders     None        Note:  This document was prepared using Dragon voice recognition software and may include unintentional dictation errors.

## 2023-08-17 NOTE — ED Triage Notes (Addendum)
First nurse note: Patient arrived by [facility] from home c/o cough with green sputum. Reports mid back pain. Audible wheezing per EMS  EMS administered: 1 duo neb with some relief  EMS vitals: 88HR 24RR 190/73 b/p

## 2023-08-17 NOTE — ED Provider Triage Note (Signed)
Emergency Medicine Provider Triage Evaluation Note  Robyn Butler , a 89 y.o. female  was evaluated in triage.  Pt complains of cough since Christmas and right lower back pain. Cough is productive of green sputum. History of COPD.  Physical Exam  BP (!) 152/61 (BP Location: Left Arm)   Pulse 93   Temp 98.4 F (36.9 C) (Oral)   Resp 17   Ht 5\' 3"  (1.6 m)   Wt 53.1 kg   SpO2 95%   BMI 20.73 kg/m  Gen:   Awake, no distress   Resp:  Normal effort. Rhonchi throughout. MSK:   Moves extremities without difficulty  Other:    Medical Decision Making  Medically screening exam initiated at 6:38 PM.  Appropriate orders placed.  Salvatore B Boatman was informed that the remainder of the evaluation will be completed by another provider, this initial triage assessment does not replace that evaluation, and the importance of remaining in the ED until their evaluation is complete.    Chinita Pester, FNP 08/17/23 1839

## 2023-08-18 MED ORDER — HYDROCODONE-ACETAMINOPHEN 5-325 MG PO TABS: 0.5000 | ORAL_TABLET | Freq: Four times a day (QID) | ORAL | 0 refills | Status: AC | PRN

## 2023-08-18 MED ORDER — PREDNISONE 20 MG PO TABS: 40.0000 mg | ORAL_TABLET | Freq: Every day | ORAL | 0 refills | Status: AC

## 2023-08-18 MED ORDER — ALBUTEROL SULFATE HFA 108 (90 BASE) MCG/ACT IN AERS: 2.0000 | INHALATION_SPRAY | Freq: Four times a day (QID) | RESPIRATORY_TRACT | 0 refills | Status: DC | PRN

## 2023-08-18 MED ORDER — HYDROCODONE-ACETAMINOPHEN 5-325 MG PO TABS: 0.5000 | ORAL_TABLET | Freq: Four times a day (QID) | ORAL | 0 refills | Status: DC | PRN

## 2023-08-18 MED ORDER — AMOXICILLIN-POT CLAVULANATE 875-125 MG PO TABS: 1.0000 | ORAL_TABLET | Freq: Two times a day (BID) | ORAL | 0 refills | Status: DC

## 2023-08-18 MED ORDER — PREDNISONE 20 MG PO TABS: 40.0000 mg | ORAL_TABLET | Freq: Every day | ORAL | 0 refills | Status: DC

## 2023-08-18 MED ORDER — HYDROCODONE-ACETAMINOPHEN 5-325 MG PO TABS
0.5000 | ORAL_TABLET | Freq: Once | ORAL | Status: AC
  Administered 2023-08-18: 0.5 via ORAL
  Filled 2023-08-18: qty 1

## 2023-08-18 NOTE — Discharge Instructions (Addendum)
You are seen in the ER today for evaluation of your cough and back pain.  Your x-Robyn Butler did not show signs of a pneumonia, but I suspect you may have a viral illness that caused a COPD flare.  I sent a prescription for

## 2023-08-23 ENCOUNTER — Inpatient Hospital Stay: Payer: PPO

## 2023-08-23 ENCOUNTER — Inpatient Hospital Stay: Payer: PPO | Attending: Internal Medicine

## 2023-08-23 VITALS — BP 142/70

## 2023-08-23 DIAGNOSIS — D631 Anemia in chronic kidney disease: Secondary | ICD-10-CM | POA: Insufficient documentation

## 2023-08-23 DIAGNOSIS — N1832 Chronic kidney disease, stage 3b: Secondary | ICD-10-CM | POA: Insufficient documentation

## 2023-08-23 DIAGNOSIS — J441 Chronic obstructive pulmonary disease with (acute) exacerbation: Secondary | ICD-10-CM | POA: Diagnosis not present

## 2023-08-23 DIAGNOSIS — M545 Low back pain, unspecified: Secondary | ICD-10-CM | POA: Diagnosis not present

## 2023-08-23 LAB — CBC WITH DIFFERENTIAL (CANCER CENTER ONLY)
Abs Immature Granulocytes: 0.16 10*3/uL — ABNORMAL HIGH (ref 0.00–0.07)
Basophils Absolute: 0 10*3/uL (ref 0.0–0.1)
Basophils Relative: 0 %
Eosinophils Absolute: 0 10*3/uL (ref 0.0–0.5)
Eosinophils Relative: 0 %
HCT: 34.8 % — ABNORMAL LOW (ref 36.0–46.0)
Hemoglobin: 10.7 g/dL — ABNORMAL LOW (ref 12.0–15.0)
Immature Granulocytes: 1 %
Lymphocytes Relative: 18 %
Lymphs Abs: 2.1 10*3/uL (ref 0.7–4.0)
MCH: 30.1 pg (ref 26.0–34.0)
MCHC: 30.7 g/dL (ref 30.0–36.0)
MCV: 97.8 fL (ref 80.0–100.0)
Monocytes Absolute: 1.2 10*3/uL — ABNORMAL HIGH (ref 0.1–1.0)
Monocytes Relative: 10 %
Neutro Abs: 8.4 10*3/uL — ABNORMAL HIGH (ref 1.7–7.7)
Neutrophils Relative %: 71 %
Platelet Count: 378 10*3/uL (ref 150–400)
RBC: 3.56 MIL/uL — ABNORMAL LOW (ref 3.87–5.11)
RDW: 12.7 % (ref 11.5–15.5)
WBC Count: 12 10*3/uL — ABNORMAL HIGH (ref 4.0–10.5)
nRBC: 0.2 % (ref 0.0–0.2)

## 2023-08-23 MED ORDER — EPOETIN ALFA-EPBX 20000 UNIT/ML IJ SOLN
20000.0000 [IU] | Freq: Once | INTRAMUSCULAR | Status: AC
Start: 2023-08-23 — End: 2023-08-23
  Administered 2023-08-23: 20000 [IU] via SUBCUTANEOUS
  Filled 2023-08-23: qty 1

## 2023-08-24 ENCOUNTER — Ambulatory Visit
Admission: RE | Admit: 2023-08-24 | Discharge: 2023-08-24 | Disposition: A | Discharge status: Home / Self Care | Payer: PPO | Source: Ambulatory Visit | Attending: Internal Medicine | Admitting: Internal Medicine

## 2023-08-24 ENCOUNTER — Other Ambulatory Visit: Payer: Self-pay | Admitting: Internal Medicine

## 2023-08-24 DIAGNOSIS — S22000A Wedge compression fracture of unspecified thoracic vertebra, initial encounter for closed fracture: Secondary | ICD-10-CM

## 2023-08-24 DIAGNOSIS — R29890 Loss of height: Secondary | ICD-10-CM | POA: Diagnosis not present

## 2023-08-24 DIAGNOSIS — I10 Essential (primary) hypertension: Secondary | ICD-10-CM | POA: Diagnosis not present

## 2023-08-24 DIAGNOSIS — S32000S Wedge compression fracture of unspecified lumbar vertebra, sequela: Secondary | ICD-10-CM | POA: Insufficient documentation

## 2023-08-24 DIAGNOSIS — M5127 Other intervertebral disc displacement, lumbosacral region: Secondary | ICD-10-CM | POA: Diagnosis not present

## 2023-08-24 DIAGNOSIS — M8448XA Pathological fracture, other site, initial encounter for fracture: Secondary | ICD-10-CM | POA: Diagnosis not present

## 2023-08-24 DIAGNOSIS — M4854XA Collapsed vertebra, not elsewhere classified, thoracic region, initial encounter for fracture: Secondary | ICD-10-CM | POA: Diagnosis not present

## 2023-08-24 DIAGNOSIS — M48061 Spinal stenosis, lumbar region without neurogenic claudication: Secondary | ICD-10-CM | POA: Diagnosis not present

## 2023-08-24 DIAGNOSIS — N179 Acute kidney failure, unspecified: Secondary | ICD-10-CM | POA: Diagnosis not present

## 2023-08-24 DIAGNOSIS — M546 Pain in thoracic spine: Secondary | ICD-10-CM | POA: Diagnosis not present

## 2023-08-24 DIAGNOSIS — M4807 Spinal stenosis, lumbosacral region: Secondary | ICD-10-CM | POA: Diagnosis not present

## 2023-08-24 DIAGNOSIS — M5126 Other intervertebral disc displacement, lumbar region: Secondary | ICD-10-CM | POA: Diagnosis not present

## 2023-08-24 DIAGNOSIS — M40204 Unspecified kyphosis, thoracic region: Secondary | ICD-10-CM | POA: Diagnosis not present

## 2023-08-25 ENCOUNTER — Other Ambulatory Visit: Payer: Self-pay

## 2023-08-25 ENCOUNTER — Inpatient Hospital Stay
Admission: EM | Admit: 2023-08-25 | Discharge: 2023-08-31 | DRG: 516 | Disposition: A | Discharge status: Skilled Nursing Facility | Payer: PPO | Attending: Obstetrics and Gynecology | Admitting: Obstetrics and Gynecology

## 2023-08-25 DIAGNOSIS — M5416 Radiculopathy, lumbar region: Secondary | ICD-10-CM | POA: Diagnosis not present

## 2023-08-25 DIAGNOSIS — E890 Postprocedural hypothyroidism: Secondary | ICD-10-CM | POA: Diagnosis not present

## 2023-08-25 DIAGNOSIS — Z7401 Bed confinement status: Secondary | ICD-10-CM | POA: Diagnosis not present

## 2023-08-25 DIAGNOSIS — Z8249 Family history of ischemic heart disease and other diseases of the circulatory system: Secondary | ICD-10-CM

## 2023-08-25 DIAGNOSIS — Z604 Social exclusion and rejection: Secondary | ICD-10-CM | POA: Diagnosis present

## 2023-08-25 DIAGNOSIS — Z87891 Personal history of nicotine dependence: Secondary | ICD-10-CM | POA: Diagnosis not present

## 2023-08-25 DIAGNOSIS — D72829 Elevated white blood cell count, unspecified: Secondary | ICD-10-CM | POA: Diagnosis not present

## 2023-08-25 DIAGNOSIS — M8448XA Pathological fracture, other site, initial encounter for fracture: Secondary | ICD-10-CM | POA: Diagnosis not present

## 2023-08-25 DIAGNOSIS — Z741 Need for assistance with personal care: Secondary | ICD-10-CM | POA: Diagnosis not present

## 2023-08-25 DIAGNOSIS — Z9049 Acquired absence of other specified parts of digestive tract: Secondary | ICD-10-CM

## 2023-08-25 DIAGNOSIS — R918 Other nonspecific abnormal finding of lung field: Secondary | ICD-10-CM | POA: Diagnosis not present

## 2023-08-25 DIAGNOSIS — E1122 Type 2 diabetes mellitus with diabetic chronic kidney disease: Secondary | ICD-10-CM | POA: Diagnosis present

## 2023-08-25 DIAGNOSIS — R0902 Hypoxemia: Secondary | ICD-10-CM | POA: Diagnosis not present

## 2023-08-25 DIAGNOSIS — R1312 Dysphagia, oropharyngeal phase: Secondary | ICD-10-CM | POA: Diagnosis not present

## 2023-08-25 DIAGNOSIS — R2689 Other abnormalities of gait and mobility: Secondary | ICD-10-CM | POA: Diagnosis not present

## 2023-08-25 DIAGNOSIS — Z888 Allergy status to other drugs, medicaments and biological substances status: Secondary | ICD-10-CM

## 2023-08-25 DIAGNOSIS — R41841 Cognitive communication deficit: Secondary | ICD-10-CM | POA: Diagnosis not present

## 2023-08-25 DIAGNOSIS — Z7982 Long term (current) use of aspirin: Secondary | ICD-10-CM

## 2023-08-25 DIAGNOSIS — I129 Hypertensive chronic kidney disease with stage 1 through stage 4 chronic kidney disease, or unspecified chronic kidney disease: Secondary | ICD-10-CM | POA: Diagnosis not present

## 2023-08-25 DIAGNOSIS — N179 Acute kidney failure, unspecified: Secondary | ICD-10-CM | POA: Diagnosis not present

## 2023-08-25 DIAGNOSIS — Z90710 Acquired absence of both cervix and uterus: Secondary | ICD-10-CM

## 2023-08-25 DIAGNOSIS — Z4789 Encounter for other orthopedic aftercare: Secondary | ICD-10-CM | POA: Diagnosis not present

## 2023-08-25 DIAGNOSIS — R531 Weakness: Secondary | ICD-10-CM | POA: Diagnosis not present

## 2023-08-25 DIAGNOSIS — S22060A Wedge compression fracture of T7-T8 vertebra, initial encounter for closed fracture: Secondary | ICD-10-CM

## 2023-08-25 DIAGNOSIS — Z79899 Other long term (current) drug therapy: Secondary | ICD-10-CM | POA: Diagnosis not present

## 2023-08-25 DIAGNOSIS — M549 Dorsalgia, unspecified: Secondary | ICD-10-CM

## 2023-08-25 DIAGNOSIS — Z841 Family history of disorders of kidney and ureter: Secondary | ICD-10-CM

## 2023-08-25 DIAGNOSIS — S22069A Unspecified fracture of T7-T8 vertebra, initial encounter for closed fracture: Secondary | ICD-10-CM | POA: Diagnosis not present

## 2023-08-25 DIAGNOSIS — S22060D Wedge compression fracture of T7-T8 vertebra, subsequent encounter for fracture with routine healing: Principal | ICD-10-CM

## 2023-08-25 DIAGNOSIS — Z825 Family history of asthma and other chronic lower respiratory diseases: Secondary | ICD-10-CM

## 2023-08-25 DIAGNOSIS — Z555 Less than a high school diploma: Secondary | ICD-10-CM

## 2023-08-25 DIAGNOSIS — Z833 Family history of diabetes mellitus: Secondary | ICD-10-CM | POA: Diagnosis not present

## 2023-08-25 DIAGNOSIS — Z66 Do not resuscitate: Secondary | ICD-10-CM | POA: Diagnosis present

## 2023-08-25 DIAGNOSIS — R32 Unspecified urinary incontinence: Secondary | ICD-10-CM | POA: Diagnosis not present

## 2023-08-25 DIAGNOSIS — J449 Chronic obstructive pulmonary disease, unspecified: Secondary | ICD-10-CM | POA: Diagnosis present

## 2023-08-25 DIAGNOSIS — Z79890 Hormone replacement therapy (postmenopausal): Secondary | ICD-10-CM

## 2023-08-25 DIAGNOSIS — K5909 Other constipation: Secondary | ICD-10-CM | POA: Diagnosis present

## 2023-08-25 DIAGNOSIS — R059 Cough, unspecified: Secondary | ICD-10-CM | POA: Diagnosis not present

## 2023-08-25 DIAGNOSIS — S22000A Wedge compression fracture of unspecified thoracic vertebra, initial encounter for closed fracture: Secondary | ICD-10-CM

## 2023-08-25 DIAGNOSIS — S22000D Wedge compression fracture of unspecified thoracic vertebra, subsequent encounter for fracture with routine healing: Secondary | ICD-10-CM | POA: Diagnosis not present

## 2023-08-25 DIAGNOSIS — M6259 Muscle wasting and atrophy, not elsewhere classified, multiple sites: Secondary | ICD-10-CM | POA: Diagnosis not present

## 2023-08-25 DIAGNOSIS — I10 Essential (primary) hypertension: Secondary | ICD-10-CM | POA: Diagnosis not present

## 2023-08-25 DIAGNOSIS — R140 Abdominal distension (gaseous): Secondary | ICD-10-CM | POA: Diagnosis not present

## 2023-08-25 DIAGNOSIS — J90 Pleural effusion, not elsewhere classified: Secondary | ICD-10-CM | POA: Diagnosis not present

## 2023-08-25 DIAGNOSIS — N1831 Chronic kidney disease, stage 3a: Secondary | ICD-10-CM | POA: Diagnosis present

## 2023-08-25 DIAGNOSIS — M6281 Muscle weakness (generalized): Secondary | ICD-10-CM | POA: Diagnosis not present

## 2023-08-25 LAB — CBC WITH DIFFERENTIAL/PLATELET
Abs Immature Granulocytes: 0.16 10*3/uL — ABNORMAL HIGH (ref 0.00–0.07)
Basophils Absolute: 0 10*3/uL (ref 0.0–0.1)
Basophils Relative: 0 %
Eosinophils Absolute: 0.1 10*3/uL (ref 0.0–0.5)
Eosinophils Relative: 0 %
HCT: 33.5 % — ABNORMAL LOW (ref 36.0–46.0)
Hemoglobin: 10.3 g/dL — ABNORMAL LOW (ref 12.0–15.0)
Immature Granulocytes: 1 %
Lymphocytes Relative: 9 %
Lymphs Abs: 1.9 10*3/uL (ref 0.7–4.0)
MCH: 30.7 pg (ref 26.0–34.0)
MCHC: 30.7 g/dL (ref 30.0–36.0)
MCV: 99.7 fL (ref 80.0–100.0)
Monocytes Absolute: 3.3 10*3/uL — ABNORMAL HIGH (ref 0.1–1.0)
Monocytes Relative: 16 %
Neutro Abs: 15.1 10*3/uL — ABNORMAL HIGH (ref 1.7–7.7)
Neutrophils Relative %: 74 %
Platelets: 320 10*3/uL (ref 150–400)
RBC: 3.36 MIL/uL — ABNORMAL LOW (ref 3.87–5.11)
RDW: 13.2 % (ref 11.5–15.5)
Smear Review: NORMAL
WBC: 20.5 10*3/uL — ABNORMAL HIGH (ref 4.0–10.5)
nRBC: 0.1 % (ref 0.0–0.2)

## 2023-08-25 LAB — URINALYSIS, W/ REFLEX TO CULTURE (INFECTION SUSPECTED)
Bacteria, UA: NONE SEEN
Bilirubin Urine: NEGATIVE
Glucose, UA: NEGATIVE mg/dL
Hgb urine dipstick: NEGATIVE
Ketones, ur: NEGATIVE mg/dL
Leukocytes,Ua: NEGATIVE
Nitrite: NEGATIVE
Protein, ur: NEGATIVE mg/dL
Specific Gravity, Urine: 1.009 (ref 1.005–1.030)
pH: 5 (ref 5.0–8.0)

## 2023-08-25 LAB — BASIC METABOLIC PANEL
Anion gap: 13 (ref 5–15)
BUN: 50 mg/dL — ABNORMAL HIGH (ref 8–23)
CO2: 25 mmol/L (ref 22–32)
Calcium: 8.4 mg/dL — ABNORMAL LOW (ref 8.9–10.3)
Chloride: 94 mmol/L — ABNORMAL LOW (ref 98–111)
Creatinine, Ser: 1.7 mg/dL — ABNORMAL HIGH (ref 0.44–1.00)
GFR, Estimated: 28 mL/min — ABNORMAL LOW (ref >60)
Glucose, Bld: 168 mg/dL — ABNORMAL HIGH (ref 70–99)
Potassium: 4.6 mmol/L (ref 3.5–5.1)
Sodium: 132 mmol/L — ABNORMAL LOW (ref 135–145)

## 2023-08-25 MED ORDER — ONDANSETRON HCL 4 MG PO TABS: 4.0000 mg | ORAL_TABLET | Freq: Four times a day (QID) | ORAL | Status: DC | PRN

## 2023-08-25 MED ORDER — PANTOPRAZOLE SODIUM 40 MG PO TBEC
40.0000 mg | DELAYED_RELEASE_TABLET | Freq: Every day | ORAL | Status: DC
Start: 2023-08-25 — End: 2023-08-31
  Administered 2023-08-25 – 2023-08-31 (×7): 40 mg via ORAL
  Filled 2023-08-25 (×7): qty 1

## 2023-08-25 MED ORDER — IPRATROPIUM-ALBUTEROL 0.5-2.5 (3) MG/3ML IN SOLN
3.0000 mL | Freq: Three times a day (TID) | RESPIRATORY_TRACT | Status: DC
  Administered 2023-08-26: 3 mL via RESPIRATORY_TRACT
  Filled 2023-08-25: qty 3

## 2023-08-25 MED ORDER — ACETAMINOPHEN 650 MG RE SUPP: 650.0000 mg | Freq: Four times a day (QID) | RECTAL | Status: DC | PRN

## 2023-08-25 MED ORDER — VITAMIN D (ERGOCALCIFEROL) 1.25 MG (50000 UNIT) PO CAPS
50000.0000 [IU] | ORAL_CAPSULE | Freq: Once | ORAL | Status: AC
  Administered 2023-08-25: 50000 [IU] via ORAL
  Filled 2023-08-25: qty 1

## 2023-08-25 MED ORDER — LATANOPROST 0.005 % OP SOLN
1.0000 [drp] | Freq: Every day | OPHTHALMIC | Status: DC
  Administered 2023-08-25 – 2023-08-30 (×6): 1 [drp] via OPHTHALMIC
  Filled 2023-08-25: qty 2.5

## 2023-08-25 MED ORDER — POLYETHYLENE GLYCOL 3350 17 G PO PACK: 17.0000 g | PACK | Freq: Every day | ORAL | Status: DC | PRN

## 2023-08-25 MED ORDER — ACETAMINOPHEN 500 MG PO TABS
1000.0000 mg | ORAL_TABLET | Freq: Once | ORAL | Status: AC
  Administered 2023-08-25: 1000 mg via ORAL
  Filled 2023-08-25: qty 2

## 2023-08-25 MED ORDER — ASPIRIN 81 MG PO TBEC
81.0000 mg | DELAYED_RELEASE_TABLET | Freq: Every day | ORAL | Status: DC
  Administered 2023-08-25 – 2023-08-31 (×7): 81 mg via ORAL
  Filled 2023-08-25 (×7): qty 1

## 2023-08-25 MED ORDER — PRAVASTATIN SODIUM 20 MG PO TABS
40.0000 mg | ORAL_TABLET | Freq: Every day | ORAL | Status: DC
  Administered 2023-08-25 – 2023-08-30 (×6): 40 mg via ORAL
  Filled 2023-08-25 (×6): qty 2

## 2023-08-25 MED ORDER — ENOXAPARIN SODIUM 30 MG/0.3ML IJ SOSY
30.0000 mg | PREFILLED_SYRINGE | INTRAMUSCULAR | Status: DC
  Administered 2023-08-25 – 2023-08-30 (×6): 30 mg via SUBCUTANEOUS
  Filled 2023-08-25 (×6): qty 0.3

## 2023-08-25 MED ORDER — ALBUTEROL SULFATE (2.5 MG/3ML) 0.083% IN NEBU: 2.5000 mg | INHALATION_SOLUTION | Freq: Four times a day (QID) | RESPIRATORY_TRACT | Status: DC | PRN

## 2023-08-25 MED ORDER — GUAIFENESIN 100 MG/5ML PO LIQD
5.0000 mL | Freq: Four times a day (QID) | ORAL | Status: DC | PRN
  Administered 2023-08-26 – 2023-08-31 (×4): 5 mL via ORAL
  Filled 2023-08-25 (×4): qty 10

## 2023-08-25 MED ORDER — DORZOLAMIDE HCL 2 % OP SOLN
1.0000 [drp] | Freq: Two times a day (BID) | OPHTHALMIC | Status: DC
  Administered 2023-08-25 – 2023-08-31 (×12): 1 [drp] via OPHTHALMIC
  Filled 2023-08-25: qty 10

## 2023-08-25 MED ORDER — HYDROMORPHONE HCL 1 MG/ML IJ SOLN: 0.5000 mg | INTRAMUSCULAR | Status: DC | PRN

## 2023-08-25 MED ORDER — HYDRALAZINE HCL 10 MG PO TABS: 10.0000 mg | ORAL_TABLET | Freq: Four times a day (QID) | ORAL | Status: DC | PRN

## 2023-08-25 MED ORDER — BENZONATATE 100 MG PO CAPS
100.0000 mg | ORAL_CAPSULE | Freq: Three times a day (TID) | ORAL | Status: DC | PRN
  Administered 2023-08-26 (×2): 100 mg via ORAL
  Filled 2023-08-25 (×2): qty 1

## 2023-08-25 MED ORDER — CYCLOBENZAPRINE HCL 10 MG PO TABS: 5.0000 mg | ORAL_TABLET | Freq: Three times a day (TID) | ORAL | Status: DC | PRN

## 2023-08-25 MED ORDER — NORTRIPTYLINE HCL 25 MG PO CAPS
25.0000 mg | ORAL_CAPSULE | Freq: Every day | ORAL | Status: DC
  Administered 2023-08-25 – 2023-08-30 (×6): 25 mg via ORAL
  Filled 2023-08-25 (×6): qty 1

## 2023-08-25 MED ORDER — PREGABALIN 50 MG PO CAPS
50.0000 mg | ORAL_CAPSULE | Freq: Two times a day (BID) | ORAL | Status: DC
  Administered 2023-08-25 – 2023-08-31 (×12): 50 mg via ORAL
  Filled 2023-08-25 (×12): qty 1

## 2023-08-25 MED ORDER — ENOXAPARIN SODIUM 40 MG/0.4ML IJ SOSY: 40.0000 mg | PREFILLED_SYRINGE | INTRAMUSCULAR | Status: DC

## 2023-08-25 MED ORDER — SODIUM CHLORIDE 0.9 % IV BOLUS
1000.0000 mL | Freq: Once | INTRAVENOUS | Status: AC
  Administered 2023-08-25: 1000 mL via INTRAVENOUS

## 2023-08-25 MED ORDER — MORPHINE SULFATE (PF) 4 MG/ML IV SOLN
4.0000 mg | Freq: Once | INTRAVENOUS | Status: AC
  Administered 2023-08-25: 4 mg via INTRAVENOUS
  Filled 2023-08-25: qty 1

## 2023-08-25 MED ORDER — LACTULOSE 10 GM/15ML PO SOLN
10.0000 g | Freq: Two times a day (BID) | ORAL | Status: DC | PRN
  Administered 2023-08-27: 10 g via ORAL
  Filled 2023-08-25: qty 30

## 2023-08-25 MED ORDER — TIOTROPIUM BROMIDE MONOHYDRATE 2.5 MCG/ACT IN AERS: 1.0000 | INHALATION_SPRAY | Freq: Every day | RESPIRATORY_TRACT | Status: DC

## 2023-08-25 MED ORDER — ONDANSETRON HCL 4 MG/2ML IJ SOLN: 4.0000 mg | Freq: Four times a day (QID) | INTRAMUSCULAR | Status: DC | PRN

## 2023-08-25 MED ORDER — CALCIUM CARBONATE 1250 (500 CA) MG PO TABS
1.0000 | ORAL_TABLET | Freq: Two times a day (BID) | ORAL | Status: DC
  Administered 2023-08-25 – 2023-08-31 (×12): 1250 mg via ORAL
  Filled 2023-08-25 (×13): qty 1

## 2023-08-25 MED ORDER — MAGNESIUM OXIDE 400 MG PO TABS
400.0000 mg | ORAL_TABLET | Freq: Every day | ORAL | Status: DC
  Administered 2023-08-26 – 2023-08-31 (×6): 400 mg via ORAL
  Filled 2023-08-25 (×11): qty 1

## 2023-08-25 MED ORDER — LEVOTHYROXINE SODIUM 50 MCG PO TABS
100.0000 ug | ORAL_TABLET | Freq: Every day | ORAL | Status: DC
  Administered 2023-08-26 – 2023-08-31 (×6): 100 ug via ORAL
  Filled 2023-08-25 (×6): qty 2

## 2023-08-25 MED ORDER — DOCUSATE SODIUM 100 MG PO CAPS
100.0000 mg | ORAL_CAPSULE | Freq: Every day | ORAL | Status: DC
  Administered 2023-08-25 – 2023-08-31 (×7): 100 mg via ORAL
  Filled 2023-08-25 (×7): qty 1

## 2023-08-25 MED ORDER — OXYCODONE HCL 5 MG PO TABS: 5.0000 mg | ORAL_TABLET | ORAL | Status: DC | PRN

## 2023-08-25 MED ORDER — VITAMIN D 25 MCG (1000 UNIT) PO TABS
2000.0000 [IU] | ORAL_TABLET | Freq: Every day | ORAL | Status: DC
  Administered 2023-08-26 – 2023-08-31 (×6): 2000 [IU] via ORAL
  Filled 2023-08-25 (×6): qty 2

## 2023-08-25 MED ORDER — LIDOCAINE 5 % EX PTCH
1.0000 | MEDICATED_PATCH | CUTANEOUS | Status: DC
  Administered 2023-08-25 – 2023-08-31 (×6): 1 via TRANSDERMAL
  Filled 2023-08-25 (×7): qty 1

## 2023-08-25 MED ORDER — ACETAMINOPHEN 325 MG PO TABS
650.0000 mg | ORAL_TABLET | Freq: Four times a day (QID) | ORAL | Status: DC | PRN
  Administered 2023-08-26 – 2023-08-30 (×3): 650 mg via ORAL
  Filled 2023-08-25 (×3): qty 2

## 2023-08-25 MED ORDER — IPRATROPIUM-ALBUTEROL 0.5-2.5 (3) MG/3ML IN SOLN
3.0000 mL | Freq: Four times a day (QID) | RESPIRATORY_TRACT | Status: DC
  Administered 2023-08-25 (×2): 3 mL via RESPIRATORY_TRACT
  Filled 2023-08-25 (×2): qty 3

## 2023-08-25 MED ORDER — UMECLIDINIUM BROMIDE 62.5 MCG/ACT IN AEPB
1.0000 | INHALATION_SPRAY | Freq: Every day | RESPIRATORY_TRACT | Status: DC
  Administered 2023-08-26 – 2023-08-31 (×6): 1 via RESPIRATORY_TRACT
  Filled 2023-08-25 (×2): qty 7

## 2023-08-25 MED ORDER — SODIUM CHLORIDE 0.9 % IV SOLN: INTRAVENOUS | Status: DC

## 2023-08-25 MED ORDER — DICLOFENAC SODIUM 1 % EX GEL
2.0000 g | CUTANEOUS | Status: DC
  Filled 2023-08-25: qty 100

## 2023-08-25 MED ORDER — AMLODIPINE BESYLATE 5 MG PO TABS: 5.0000 mg | ORAL_TABLET | Freq: Every day | ORAL | Status: DC

## 2023-08-25 MED ORDER — VALSARTAN-HYDROCHLOROTHIAZIDE 160-25 MG PO TABS: 1.0000 | ORAL_TABLET | Freq: Every day | ORAL | Status: DC

## 2023-08-25 NOTE — ED Provider Notes (Signed)
 [facility] Provider Note    Event Date/Time   First MD Initiated Contact with Patient 08/25/23 1147     (approximate)   History   Chief Complaint: Back Pain   HPI  Robyn Butler is a 89 y.o. female with a history of osteoporosis hypertension diabetes CKD COPD who reports having sudden onset of back pain that started when she was trying to lift the corner of her mattress to put a new sheet on.  She thought she had pulled a muscle but over the following days the pain became much worse until is so severe that it is limiting her function at home.  When completely still pain is bearable, but with any movement including arm use, she has 10/10 pain.  This is caused her to stop eating and drinking over the last couple of days to avoid the pain.  She lives alone in an apartment, has neighbors who check on her.   Has baseline urinary incontinence, denies bowel or bladder retention or changes in routines.  No lower extremity motor weakness or loss of sensation.       Physical Exam   Triage Vital Signs: ED Triage Vitals  Encounter Vitals Group     BP 08/25/23 1126 (!) 126/49     Systolic BP Percentile --      Diastolic BP Percentile --      Pulse Rate 08/25/23 1126 90     Resp 08/25/23 1126 (!) 22     Temp 08/25/23 1126 98 F (36.7 C)     Temp src --      SpO2 08/25/23 1126 95 %     Weight 08/25/23 1121 116 lb 13.5 oz (53 kg)     Height 08/25/23 1127 5' 3 (1.6 m)     Head Circumference --      Peak Flow --      Pain Score 08/25/23 1121 10     Pain Loc --      Pain Education --      Exclude from Growth Chart --     Most recent vital signs: Vitals:   08/25/23 1126  BP: (!) 126/49  Pulse: 90  Resp: (!) 22  Temp: 98 F (36.7 C)  SpO2: 95%    General: Awake, no distress.  CV:  Good peripheral perfusion.  Irregular rhythm Resp:  Normal effort.  Clear lungs Abd:  No distention.  Soft nontender Other:  Dry oral mucosa   ED Results /  Procedures / Treatments   Labs (all labs ordered are listed, but only abnormal results are displayed) Labs Reviewed  BASIC METABOLIC PANEL - Abnormal; Notable for the following components:      Result Value   Sodium 132 (*)    Chloride 94 (*)    Glucose, Bld 168 (*)    BUN 50 (*)    Creatinine, Ser 1.70 (*)    Calcium  8.4 (*)    GFR, Estimated 28 (*)    All other components within normal limits  CBC WITH DIFFERENTIAL/PLATELET - Abnormal; Notable for the following components:   WBC 20.5 (*)    RBC 3.36 (*)    Hemoglobin 10.3 (*)    HCT 33.5 (*)    Neutro Abs 15.1 (*)    Monocytes Absolute 3.3 (*)    Abs Immature Granulocytes 0.16 (*)    All other components within normal limits  URINALYSIS, W/ REFLEX TO CULTURE (INFECTION SUSPECTED) - Abnormal; Notable for the following  components:   Color, Urine YELLOW (*)    APPearance HAZY (*)    All other components within normal limits  PATHOLOGIST SMEAR REVIEW     EKG    RADIOLOGY MRI lumbar and thoracic spine from August 24, 2023 reviewed, showing acute compression fracture of T7 as well as chronic lumbar spinal stenosis and herniated disc.   PROCEDURES:  Procedures   MEDICATIONS ORDERED IN ED: Medications  lidocaine  (LIDODERM ) 5 % 1 patch (1 patch Transdermal Patch Applied 08/25/23 1219)  sodium chloride  0.9 % bolus 1,000 mL (0 mLs Intravenous Stopped 08/25/23 1343)  morphine  (PF) 4 MG/ML injection 4 mg (4 mg Intravenous Given 08/25/23 1218)  acetaminophen  (TYLENOL ) tablet 1,000 mg (1,000 mg Oral Given 08/25/23 1218)     IMPRESSION / MDM / ASSESSMENT AND PLAN / ED COURSE  I reviewed the triage vital signs and the nursing notes.  DDx: Electrolyte derangement, AKI, UTI, intractable back pain due to compression fracture  Patient's presentation is most consistent with acute presentation with potential threat to life or bodily function.  Patient presents with intractable pain after spinal compression fracture a week ago.   Symptoms are severe enough that she has had a decline in functional status, unable to care for herself, even with assistance from neighbors and family member at home, the patient is not eating and drinking adequately due to her pain.  Tried oxycodone  at home sufficient relief.  Labs reveal AKI.  Will need to hospitalize for further management.  Start IV fluids.  IV morphine  for pain control. Will order TLSO brace  ----------------------------------------- 2:14 PM on 08/25/2023 ----------------------------------------- Case d/w hospitalist.        FINAL CLINICAL IMPRESSION(S) / ED DIAGNOSES   Final diagnoses:  Compression fracture of T7 vertebra with routine healing, subsequent encounter  Intractable back pain  AKI (acute kidney injury) (HCC)     Rx / DC Orders   ED Discharge Orders     None        Note:  This document was prepared using Dragon voice recognition software and may include unintentional dictation errors.   Viviann Pastor, MD 08/25/23 1414

## 2023-08-25 NOTE — H&P (Signed)
 History and Physical    Robyn Butler FMW:[id number] DOB: 12/22/1928 DOA: 08/25/2023  PCP: Lenon Layman ORN, MD (Confirm with patient/family/NH records and if not entered, this has to be entered at [facility] point of entry) Patient coming from: Assisted living  I have personally briefly reviewed patient's old medical records in [facility]  Chief Complaint: Back pain  HPI: Robyn Butler is a 89 y.o. female with medical history significant of COPD, HTN, HLD, hypothyroidism, CKD stage IIIa, osteoporosis, presented with worsening of back pain.  Patient lives by herself and at bedside with steady gait.  3 days ago, patient tried deep to corner of her mattress similarly developed a 10/10 sharp like back pain.  Denies any chest pain or abdominal pain or leg pain. But she does have chronic constipation and urinary incontinence.  Went to see PCP yesterday and was given lidocaine  patch however pain well-controlled.  MRI of patient was done which showed T7 compression fracture.  Last 2 days, patient's neighbor has been helping her however patient has had significant decrease of activity because of fear of more back pain as result she mainly stays in bed for last 2 days and has had significant decrease of oral intake because of the back pain.  1 week ago patient started develop productive cough with yellowish sputum production and shortness of breath and wheezing came to ED when she was diagnosed with bronchitis and COPD exacerbation and sent home with antibiotics and prednisone  tapering doses and inhalers.  She reported significant improvement of shortness of breath and wheezing and the cough has become dry.  No fever or chills.  ED Course: Afebrile, blood pressure 120/40, pulse is 90.  Blood work showed WBC 20.5 hemoglobin 10.3 platelet 320, potassium 4.6 BUN 50 creatinine 1.7 glucose 168.  Patient was given morphine  x 1 in the ED  Review of Systems: As per HPI otherwise 14 point review of systems  negative.    Past Medical History:  Diagnosis Date   Arthritis    Cancer (HCC)    Chronic kidney disease    COPD (chronic obstructive pulmonary disease) (HCC)    Diabetes mellitus without complication (HCC)    type 2   Hypertension    Varicose veins of lower extremities with other complications     Past Surgical History:  Procedure Laterality Date   ABDOMINAL HYSTERECTOMY  1972   BLADDER SUSPENSION     BREAST EXCISIONAL BIOPSY Right 1999   neg   CHOLECYSTECTOMY  1975   COLONOSCOPY     COLONOSCOPY WITH PROPOFOL  N/A 05/24/2015   Procedure: COLONOSCOPY WITH PROPOFOL ;  Surgeon: Deward CINDERELLA Piedmont, MD;  Location: [facility] ENDOSCOPY;  Service: Gastroenterology;  Laterality: N/A;   CYSTOSCOPY  1972   ESOPHAGOGASTRODUODENOSCOPY N/A 05/24/2015   Procedure: ESOPHAGOGASTRODUODENOSCOPY (EGD);  Surgeon: Deward CINDERELLA Piedmont, MD;  Location: [facility] ENDOSCOPY;  Service: Gastroenterology;  Laterality: N/A;   FRACTURE SURGERY     JOINT REPLACEMENT Right 2013   hip   salpingo oophorectmy      THYROIDECTOMY  1969   WRIST FRACTURE SURGERY Left      reports that she quit smoking about 32 years ago. Her smoking use included cigarettes. She has never used smokeless tobacco. She reports that she does not drink alcohol and does not use drugs.  Allergies  Allergen Reactions   Alendronate Nausea And Vomiting    Other reaction(s): Vomiting    Family History  Problem Relation Age of Onset   Heart attack Father  Asthma Father    Kidney disease Mother    Hypertension Mother    Hypertension Other    Diabetes Other    Heart attack Other    Breast cancer Neg Hx      Prior to Admission medications   Medication Sig Start Date End Date Taking? Authorizing Provider  albuterol  (VENTOLIN  HFA) 108 (90 Base) MCG/ACT inhaler Inhale 2 puffs into the lungs every 6 (six) hours as needed for wheezing or shortness of breath. 03/02/22   Josette Ade, MD  albuterol  (VENTOLIN  HFA) 108 (90 Base) MCG/ACT inhaler Inhale 2 puffs into  the lungs every 6 (six) hours as needed for wheezing or shortness of breath. 08/18/23   Levander Slate, MD  amLODipine  (NORVASC ) 5 MG tablet TAKE ONE(1) TABLET EACH DAY Patient taking differently: Take 5 mg by mouth daily. 03/22/19   Gasper Nancyann BRAVO, MD  amoxicillin -clavulanate (AUGMENTIN ) 875-125 MG tablet Take 1 tablet by mouth 2 (two) times daily for 7 days. 08/18/23 08/25/23  Levander Slate, MD  aspirin  EC 81 MG tablet Take 81 mg by mouth daily.     [provider]  Cholecalciferol  (VITAMIN D3) 50 MCG (2000 UT) TABS Take 1 tablet by mouth daily.    [provider]  Cranberry 500 MG CAPS Take 500 mg by mouth daily.    [provider]  cyanocobalamin  1000 MCG tablet Take 1,000 mcg by mouth daily.    [provider]  dorzolamide  (TRUSOPT ) 2 % ophthalmic solution Place 1 drop into both eyes 2 (two) times daily.     [provider]  ibandronate  (BONIVA ) 150 MG tablet TAKE ONE TABLET ONCE A MONTH FIRST THING IN THE MORNING AT LEAST 1 HOUR BEFORE EATING, TAKE WITH WATER. 03/07/19   Gasper Nancyann BRAVO, MD  latanoprost  (XALATAN ) 0.005 % ophthalmic solution Place 1 drop into both eyes at bedtime.     [provider]  levothyroxine  (SYNTHROID ) 100 MCG tablet TAKE 1 TABLET BY MOUTH EVERY DAY Patient taking differently: Take 100 mcg by mouth daily before breakfast. 03/22/19   Gasper Nancyann BRAVO, MD  loperamide  (IMODIUM ) 2 MG capsule Take 2 mg by mouth as needed for diarrhea or loose stools.    [provider]  lovastatin (MEVACOR) 40 MG tablet TAKE 1 TABLET BY MOUTH AT BEDTIME 08/28/19   Gasper Nancyann BRAVO, MD  magnesium  oxide (MAG-OX) 400 MG tablet Take 400 mg by mouth daily.    [provider]  Multiple Vitamins-Minerals (PRESERVISION AREDS 2) CAPS Take 1 capsule by mouth 2 (two) times daily.    [provider]  nortriptyline  (PAMELOR ) 25 MG capsule TAKE 1 CAPSULE(25 MG) BY MOUTH AT BEDTIME 08/16/19   Gasper Nancyann BRAVO, MD  omeprazole  (PRILOSEC) 40 MG  capsule Take 1 capsule (40 mg total) by mouth daily. 09/17/18   Gasper Nancyann BRAVO, MD  Polyethylene Glycol 3350  (DULCOLAX BALANCE PO) Take 2 tablets by mouth as needed.    [provider]  pregabalin  (LYRICA ) 50 MG capsule Take 50 mg by mouth 2 (two) times daily. 07/17/23 07/16/24  [provider]  SPIRIVA  RESPIMAT 2.5 MCG/ACT AERS SMARTSIG:2 Puff(s) By Mouth Daily 06/09/19   [provider]  valsartan -hydrochlorothiazide  (DIOVAN -HCT) 160-25 MG tablet Take 1 tablet by mouth daily. 09/17/18   Gasper Nancyann BRAVO, MD  zinc sulfate 220 (50 Zn) MG capsule Take 220 mg by mouth daily.    [provider]    Physical Exam: Vitals:   08/25/23 1121 08/25/23 1126 08/25/23 1127  BP:  ROLLEN)  126/49   Pulse:  90   Resp:  (!) 22   Temp:  98 F (36.7 C)   SpO2:  95%   Weight: 53 kg  52.6 kg  Height:   5' 3 (1.6 m)    Constitutional: NAD, calm, comfortable Vitals:   08/25/23 1121 08/25/23 1126 08/25/23 1127  BP:  (!) 126/49   Pulse:  90   Resp:  (!) 22   Temp:  98 F (36.7 C)   SpO2:  95%   Weight: 53 kg  52.6 kg  Height:   5' 3 (1.6 m)   Eyes: PERRL, lids and conjunctivae normal ENMT: Mucous membranes are moist. Posterior pharynx clear of any exudate or lesions.Normal dentition.  Neck: normal, supple, no masses, no thyromegaly Respiratory: clear to auscultation bilaterally, no wheezing, no crackles. Normal respiratory effort. No accessory muscle use.  Cardiovascular: Regular rate and rhythm, no murmurs / rubs / gallops. No extremity edema. 2+ pedal pulses. No carotid bruits.  Abdomen: no tenderness, no masses palpated. No hepatosplenomegaly. Bowel sounds positive.  Musculoskeletal: no clubbing / cyanosis. No joint deformity upper and lower extremities. Good ROM, no contractures. Normal muscle tone.  Skin: no rashes, lesions, ulcers. No induration Neurologic: CN 2-12 grossly intact. Sensation intact, DTR normal. Strength 5/5 in all 4.  Psychiatric: Normal judgment  and insight. Alert and oriented x 3. Normal mood.     Labs on Admission: I have personally reviewed following labs and imaging studies  CBC: Recent Labs  Lab 08/23/23 1521 08/25/23 1213  WBC 12.0* 20.5*  NEUTROABS 8.4* 15.1*  HGB 10.7* 10.3*  HCT 34.8* 33.5*  MCV 97.8 99.7  PLT 378 320   Basic Metabolic Panel: Recent Labs  Lab 08/25/23 1213  NA 132*  K 4.6  CL 94*  CO2 25  GLUCOSE 168*  BUN 50*  CREATININE 1.70*  CALCIUM  8.4*   GFR: Estimated Creatinine Clearance: 17.5 mL/min (A) (by C-G formula based on SCr of 1.7 mg/dL (H)). Liver Function Tests: No results for input(s): AST, ALT, ALKPHOS, BILITOT, PROT, ALBUMIN  in the last 168 hours. No results for input(s): LIPASE, AMYLASE in the last 168 hours. No results for input(s): AMMONIA in the last 168 hours. Coagulation Profile: No results for input(s): INR, PROTIME in the last 168 hours. Cardiac Enzymes: No results for input(s): CKTOTAL, CKMB, CKMBINDEX, TROPONINI in the last 168 hours. BNP (last 3 results) No results for input(s): PROBNP in the last 8760 hours. HbA1C: No results for input(s): HGBA1C in the last 72 hours. CBG: No results for input(s): GLUCAP in the last 168 hours. Lipid Profile: No results for input(s): CHOL, HDL, LDLCALC, TRIG, CHOLHDL, LDLDIRECT in the last 72 hours. Thyroid  Function Tests: No results for input(s): TSH, T4TOTAL, FREET4, T3FREE, THYROIDAB in the last 72 hours. Anemia Panel: No results for input(s): VITAMINB12, FOLATE, FERRITIN, TIBC, IRON , RETICCTPCT in the last 72 hours. Urine analysis:    Component Value Date/Time   COLORURINE YELLOW (A) 08/25/2023 1213   APPEARANCEUR HAZY (A) 08/25/2023 1213   APPEARANCEUR Hazy 11/08/2012 1006   LABSPEC 1.009 08/25/2023 1213   LABSPEC 1.015 11/08/2012 1006   PHURINE 5.0 08/25/2023 1213   GLUCOSEU NEGATIVE 08/25/2023 1213   GLUCOSEU Negative 11/08/2012 1006   HGBUR  NEGATIVE 08/25/2023 1213   BILIRUBINUR NEGATIVE 08/25/2023 1213   BILIRUBINUR Negative 11/08/2012 1006   KETONESUR NEGATIVE 08/25/2023 1213   PROTEINUR NEGATIVE 08/25/2023 1213   NITRITE NEGATIVE 08/25/2023 1213   LEUKOCYTESUR NEGATIVE 08/25/2023 1213   LEUKOCYTESUR Trace  11/08/2012 1006    Radiological Exams on Admission: MR THORACIC SPINE WO CONTRAST Result Date: 08/24/2023 CLINICAL DATA:  Mid back pain after coughing episode 1 week ago. EXAM: MRI THORACIC SPINE WITHOUT CONTRAST TECHNIQUE: Multiplanar, multisequence MR imaging of the thoracic spine was performed. No intravenous contrast was administered. COMPARISON:  None Available. FINDINGS: Alignment: No significant listhesis is present. Thoracic kyphosis is slightly exaggerated. Vertebrae: Acute vertebral plana compression fracture is present at T7. Height is narrowed to less than 8 mm. This compares to 16 mm at the adjacent level. Marrow signal and vertebral body heights are otherwise normal. Cord: Bony elements retropulsed into the spinal canal at T7 efface the ventral surface of the cord. No abnormal signal is present. Cord signal and morphology is otherwise normal. Paraspinal and other soft tissues: The paraspinous soft tissues within normal limits. Disc levels: No significant thoracic disc disease or central canal stenosis is present. The foramina are patent bilaterally. IMPRESSION: 1. Acute vertebral plana compression fracture at T7 with height loss of less than 8 mm. 2. Bony elements retropulsed into the spinal canal at T7 efface the ventral surface of the cord. No abnormal signal is present. 3. No significant thoracic disc disease or central canal stenosis. Electronically Signed   By: Lonni Necessary M.D.   On: 08/24/2023 18:07   MR LUMBAR SPINE WO CONTRAST Result Date: 08/24/2023 CLINICAL DATA:  Mid back pain after coughing episode 1 week ago. EXAM: MRI LUMBAR SPINE WITHOUT CONTRAST TECHNIQUE: Multiplanar, multisequence MR imaging of  the lumbar spine was performed. No intravenous contrast was administered. COMPARISON:  Lumbar spine radiographs 08/05/2015 FINDINGS: Segmentation: 5 non rib-bearing lumbar type vertebral bodies are present. The lowest fully formed vertebral body is L5. Alignment: No significant listhesis is present. Straightening of the normal lumbar lordosis is present. Mild leftward curvature is centered at L3. Vertebrae: Mild edematous endplate changes are present in association with the superior endplate Schmorl's node at L5. Type 2 Modic changes are present at L2-3 and L3-4. Marrow signal and vertebral body heights are otherwise normal. Conus medullaris and cauda equina: Conus extends to the L1 level. Conus and cauda equina appear normal. Paraspinal and other soft tissues: Limited imaging the abdomen is unremarkable. There is no significant adenopathy. No solid organ lesions are present. Disc levels: L1-2: The globes and orbits are within normal limits. L2-3: A large broad-based disc protrusion is present. Mild facet hypertrophy and ligamentum flavum thickening is present. This results in moderate central canal stenosis with crowding of the nerve roots. Subarticular narrowing is worse right than left. Moderate right and mild left foraminal stenosis is present. L3-4: A leftward disc protrusion is present. Moderate facet hypertrophy is noted bilaterally. Mild subarticular narrowing is present bilaterally. Mild foraminal narrowing is present bilaterally. L4-5: A broad-based disc protrusion is present. Moderate facet hypertrophy and ligamentum flavum thickening results in severe central canal stenosis. Moderate left and mild right foraminal narrowing is present. L5-S1: A broad-based disc protrusion is present. Moderate subarticular stenosis is present bilaterally. Moderate left and mild right foraminal stenosis is present IMPRESSION: 1. Severe central canal stenosis at L4-5 secondary to a broad-based disc protrusion, facet  hypertrophy, and ligamentum flavum thickening. 2. Moderate left and mild right foraminal stenosis at L4-5. 3. Moderate central canal stenosis with crowding of the nerve roots at L2-3. Subarticular narrowing is worse right than left. Moderate right and mild left foraminal stenosis is present. 4. Moderate subarticular stenosis bilaterally at L5-S1. Moderate left and mild right foraminal stenosis is present.  5. Mild subarticular narrowing bilaterally at L3-4. 6. Mild foraminal narrowing bilaterally at L3-4. Electronically Signed   By: Lonni Necessary M.D.   On: 08/24/2023 18:02    EKG: Independently reviewed.  Sinus rhythm, no acute ST changes.  Assessment/Plan Principal Problem:   Thoracic compression fracture (HCC)  (please populate well all problems here in Problem List. (For example, if patient is on BP meds at home and you resume or decide to hold them, it is a problem that needs to be her. Same for CAD, COPD, HLD and so on)  T7 vertebral compression fracture Pathologic fracture -Secondary to severe osteoporosis -Neuro exam benign -Patient already on biphosphonate -Recheck vitamin D  level and 1 dose of vit D2 given -Add Ca supplement -TLSO and PT evaluation -PRN Dilaudid  for pain, as needed Flexeril  for spasm  Leukocytosis -Patient came to the hospital 7 days ago for COPD exacerbation and bronchitis and was discharged from ED with Augmentin  and steroids.  Patient reported improvement of breathing symptoms after taking 7 days of above medications.  Clinically patient has normal signs of active infection, suspect leukocytosis secondary to steroid use.  Morphology of WBC appear to be within normal limits.  AKI on CKD stage IIIa -Clinically patient appeared to be dehydrated secondary to poor p.o. intake -Start IV fluid and recheck kidney function tomorrow  COPD -Incentive spirometry -DuoNebs as needed  Hx of lumbar radiculopathy -Lumbar MRI reviewed showing chronic  changes.  Osteoporosis -Continue biphosphate  HTN -Hold off home BP meds -Start as needed hydralazine    DVT prophylaxis: Lovenoc Code Status: DNR Family Communication: Daughter at bedside Disposition Plan: Patient sick with thoracic vertebral compression fracture severe pain requiring IV medications and expect more than 2 midnight hospital stay, expect patient discharged to SNF Consults called: None Admission status: Medsurg admit   Cort ONEIDA Mana MD [facility] Pager [phone] 08/25/2023, 2:32 PM

## 2023-08-25 NOTE — ED Triage Notes (Signed)
 Arrived by [facility] from [facility]. Had MRI yesterday of back due to previous fall.   Reports nausea. Last had oxycodone  around 9am today  EMS vitals: 178/63 b/p  76HR 95% RA  Hx: COPD

## 2023-08-25 NOTE — ED Triage Notes (Signed)
 C/O mid back pain.

## 2023-08-25 NOTE — Progress Notes (Signed)
 PHARMACIST - PHYSICIAN COMMUNICATION  CONCERNING:  Enoxaparin  (Lovenox ) for DVT Prophylaxis    RECOMMENDATION: Patient was prescribed enoxaprin 40mg  q24 hours for VTE prophylaxis.   Filed Weights   08/25/23 1121 08/25/23 1127  Weight: 53 kg (116 lb 13.5 oz) 52.6 kg (116 lb)    Body mass index is 20.55 kg/m.  Estimated Creatinine Clearance: 17.5 mL/min (A) (by C-G formula based on SCr of 1.7 mg/dL (H)).  Patient is candidate for enoxaparin  30mg  every 24 hours based on CrCl <53ml/min or Weight <45kg  DESCRIPTION: Pharmacy has adjusted enoxaparin  dose per [facility] policy.  Patient is now receiving enoxaparin  30 mg every 24 hours    Olam KANDICE Fritter, PharmD Clinical Pharmacist  08/25/2023 2:39 PM

## 2023-08-26 DIAGNOSIS — S22060A Wedge compression fracture of T7-T8 vertebra, initial encounter for closed fracture: Secondary | ICD-10-CM | POA: Diagnosis not present

## 2023-08-26 LAB — BASIC METABOLIC PANEL
Anion gap: 9 (ref 5–15)
BUN: 40 mg/dL — ABNORMAL HIGH (ref 8–23)
CO2: 26 mmol/L (ref 22–32)
Calcium: 8.3 mg/dL — ABNORMAL LOW (ref 8.9–10.3)
Chloride: 100 mmol/L (ref 98–111)
Creatinine, Ser: 1.33 mg/dL — ABNORMAL HIGH (ref 0.44–1.00)
GFR, Estimated: 38 mL/min — ABNORMAL LOW (ref >60)
Glucose, Bld: 134 mg/dL — ABNORMAL HIGH (ref 70–99)
Potassium: 4.7 mmol/L (ref 3.5–5.1)
Sodium: 135 mmol/L (ref 135–145)

## 2023-08-26 LAB — CBC
HCT: 28.8 % — ABNORMAL LOW (ref 36.0–46.0)
Hemoglobin: 9.1 g/dL — ABNORMAL LOW (ref 12.0–15.0)
MCH: 30.8 pg (ref 26.0–34.0)
MCHC: 31.6 g/dL (ref 30.0–36.0)
MCV: 97.6 fL (ref 80.0–100.0)
Platelets: 281 10*3/uL (ref 150–400)
RBC: 2.95 MIL/uL — ABNORMAL LOW (ref 3.87–5.11)
RDW: 13.2 % (ref 11.5–15.5)
WBC: 13 10*3/uL — ABNORMAL HIGH (ref 4.0–10.5)
nRBC: 0 % (ref 0.0–0.2)

## 2023-08-26 LAB — VITAMIN D 25 HYDROXY (VIT D DEFICIENCY, FRACTURES): Vit D, 25-Hydroxy: 45.82 ng/mL (ref 30–100)

## 2023-08-26 MED ORDER — SODIUM CHLORIDE 0.9 % IV SOLN: INTRAVENOUS | Status: AC

## 2023-08-26 MED ORDER — KETOROLAC TROMETHAMINE 15 MG/ML IJ SOLN
15.0000 mg | Freq: Four times a day (QID) | INTRAMUSCULAR | Status: DC
  Administered 2023-08-26 – 2023-08-28 (×8): 15 mg via INTRAVENOUS
  Filled 2023-08-26 (×8): qty 1

## 2023-08-26 MED ORDER — METHOCARBAMOL 500 MG PO TABS
500.0000 mg | ORAL_TABLET | Freq: Three times a day (TID) | ORAL | Status: DC
  Administered 2023-08-26 (×3): 500 mg via ORAL
  Filled 2023-08-26 (×3): qty 1

## 2023-08-26 NOTE — Progress Notes (Signed)
 PROGRESS NOTE    Robyn Butler  FMW:[id number] DOB: May 17, 1929 DOA: 08/25/2023 PCP: Lenon Layman ORN, MD    Brief Narrative:  89 y.o. female with medical history significant of COPD, HTN, HLD, hypothyroidism, CKD stage IIIa, osteoporosis, presented with worsening of back pain.   Patient lives by herself and at bedside with steady gait.  3 days ago, patient tried deep to corner of her mattress similarly developed a 10/10 sharp like back pain.  Denies any chest pain or abdominal pain or leg pain. But she does have chronic constipation and urinary incontinence.  Went to see PCP yesterday and was given lidocaine  patch however pain well-controlled.  MRI of patient was done which showed T7 compression fracture.  Last 2 days, patient's neighbor has been helping her however patient has had significant decrease of activity because of fear of more back pain as result she mainly stays in bed for last 2 days and has had significant decrease of oral intake because of the back pain.  1 week ago patient started develop productive cough with yellowish sputum production and shortness of breath and wheezing came to ED when she was diagnosed with bronchitis and COPD exacerbation and sent home with antibiotics and prednisone  tapering doses and inhalers.  She reported significant improvement of shortness of breath and wheezing and the cough has become dry.  No fever or chills.   Assessment & Plan:   Principal Problem:   Thoracic compression fracture (HCC) T7 vertebral compression fracture Pathologic fracture -Secondary to severe osteoporosis -Neuro exam benign -Patient already on biphosphonate Plan: Pain control Calcium  and vitamin D  supplementation TLSO brace Therapy evaluations If pain not controlled with conservative measures we will reach out to IR to consider kyphoplasty  Leukocytosis -Patient came to the hospital 7 days ago for COPD exacerbation and bronchitis and was discharged from ED with  Augmentin  and steroids.  Patient reported improvement of breathing symptoms after taking 7 days of above medications.  Clinically patient has no signs of active infection, suspect leukocytosis secondary to steroid use.  Morphology of WBC appear to be within normal limits.   AKI on CKD stage IIIa -Clinically patient appeared to be dehydrated secondary to poor p.o. intake -Improving over interval   COPD -Incentive spirometry -DuoNebs as needed   Hx of lumbar radiculopathy -Lumbar MRI reviewed showing chronic changes.   Osteoporosis -Continue biphosphate   HTN -Hold off home BP meds -Start as needed hydralazine    DVT prophylaxis: SQ lovenox  Code Status: DNR Family Communication:Daughter Romero via phone [phone] Disposition Plan: Status is: Inpatient Remains inpatient appropriate because: Intractable pain, thoracic vertebra fracture   Level of care: Med-Surg  Consultants:  None  Procedures:  None  Antimicrobials: None    Subjective: Seen and examined.  Reports continued back pain.  Objective: Vitals:   08/25/23 2106 08/26/23 0244 08/26/23 0751 08/26/23 0831  BP:  (!) 154/49  (!) 128/35  Pulse:  80 80 90  Resp:  17 16 16   Temp:  97.6 F (36.4 C)  98 F (36.7 C)  SpO2: 93% 96% 94% 94%  Weight:      Height:        Intake/Output Summary (Last 24 hours) at 08/26/2023 1041 Last data filed at 08/26/2023 1039 Gross per 24 hour  Intake 1659 ml  Output 600 ml  Net 1059 ml   Filed Weights   08/25/23 1121 08/25/23 1127  Weight: 53 kg 52.6 kg    Examination:  General exam: Appears calm and comfortable  Respiratory system: Clear to auscultation. Respiratory effort normal. Cardiovascular system: S1-S2, RRR, no murmurs, no pedal edema Gastrointestinal system: Soft, NT/ND, normal bowel sounds Central nervous system: Alert and oriented. No focal neurological deficits. Extremities: Decreased power bilateral lower extremities.  Decreased range of motion.  Gait not  assessed Skin: No rashes, lesions or ulcers Psychiatry: Judgement and insight appear normal. Mood & affect appropriate.     Data Reviewed: I have personally reviewed following labs and imaging studies  CBC: Recent Labs  Lab 08/23/23 1521 08/25/23 1213 08/26/23 0418  WBC 12.0* 20.5* 13.0*  NEUTROABS 8.4* 15.1*  --   HGB 10.7* 10.3* 9.1*  HCT 34.8* 33.5* 28.8*  MCV 97.8 99.7 97.6  PLT 378 320 281   Basic Metabolic Panel: Recent Labs  Lab 08/25/23 1213 08/26/23 0418  NA 132* 135  K 4.6 4.7  CL 94* 100  CO2 25 26  GLUCOSE 168* 134*  BUN 50* 40*  CREATININE 1.70* 1.33*  CALCIUM  8.4* 8.3*   GFR: Estimated Creatinine Clearance: 22.3 mL/min (A) (by C-G formula based on SCr of 1.33 mg/dL (H)). Liver Function Tests: No results for input(s): AST, ALT, ALKPHOS, BILITOT, PROT, ALBUMIN  in the last 168 hours. No results for input(s): LIPASE, AMYLASE in the last 168 hours. No results for input(s): AMMONIA in the last 168 hours. Coagulation Profile: No results for input(s): INR, PROTIME in the last 168 hours. Cardiac Enzymes: No results for input(s): CKTOTAL, CKMB, CKMBINDEX, TROPONINI in the last 168 hours. BNP (last 3 results) No results for input(s): PROBNP in the last 8760 hours. HbA1C: No results for input(s): HGBA1C in the last 72 hours. CBG: No results for input(s): GLUCAP in the last 168 hours. Lipid Profile: No results for input(s): CHOL, HDL, LDLCALC, TRIG, CHOLHDL, LDLDIRECT in the last 72 hours. Thyroid  Function Tests: No results for input(s): TSH, T4TOTAL, FREET4, T3FREE, THYROIDAB in the last 72 hours. Anemia Panel: No results for input(s): VITAMINB12, FOLATE, FERRITIN, TIBC, IRON , RETICCTPCT in the last 72 hours. Sepsis Labs: No results for input(s): PROCALCITON, LATICACIDVEN in the last 168 hours.  No results found for this or any previous visit (from the past 240 hours).        Radiology Studies: MR THORACIC SPINE WO CONTRAST Result Date: 08/24/2023 CLINICAL DATA:  Mid back pain after coughing episode 1 week ago. EXAM: MRI THORACIC SPINE WITHOUT CONTRAST TECHNIQUE: Multiplanar, multisequence MR imaging of the thoracic spine was performed. No intravenous contrast was administered. COMPARISON:  None Available. FINDINGS: Alignment: No significant listhesis is present. Thoracic kyphosis is slightly exaggerated. Vertebrae: Acute vertebral plana compression fracture is present at T7. Height is narrowed to less than 8 mm. This compares to 16 mm at the adjacent level. Marrow signal and vertebral body heights are otherwise normal. Cord: Bony elements retropulsed into the spinal canal at T7 efface the ventral surface of the cord. No abnormal signal is present. Cord signal and morphology is otherwise normal. Paraspinal and other soft tissues: The paraspinous soft tissues within normal limits. Disc levels: No significant thoracic disc disease or central canal stenosis is present. The foramina are patent bilaterally. IMPRESSION: 1. Acute vertebral plana compression fracture at T7 with height loss of less than 8 mm. 2. Bony elements retropulsed into the spinal canal at T7 efface the ventral surface of the cord. No abnormal signal is present. 3. No significant thoracic disc disease or central canal stenosis. Electronically Signed   By: Lonni Necessary M.D.   On: 08/24/2023 18:07   MR LUMBAR  SPINE WO CONTRAST Result Date: 08/24/2023 CLINICAL DATA:  Mid back pain after coughing episode 1 week ago. EXAM: MRI LUMBAR SPINE WITHOUT CONTRAST TECHNIQUE: Multiplanar, multisequence MR imaging of the lumbar spine was performed. No intravenous contrast was administered. COMPARISON:  Lumbar spine radiographs 08/05/2015 FINDINGS: Segmentation: 5 non rib-bearing lumbar type vertebral bodies are present. The lowest fully formed vertebral body is L5. Alignment: No significant listhesis is present.  Straightening of the normal lumbar lordosis is present. Mild leftward curvature is centered at L3. Vertebrae: Mild edematous endplate changes are present in association with the superior endplate Schmorl's node at L5. Type 2 Modic changes are present at L2-3 and L3-4. Marrow signal and vertebral body heights are otherwise normal. Conus medullaris and cauda equina: Conus extends to the L1 level. Conus and cauda equina appear normal. Paraspinal and other soft tissues: Limited imaging the abdomen is unremarkable. There is no significant adenopathy. No solid organ lesions are present. Disc levels: L1-2: The globes and orbits are within normal limits. L2-3: A large broad-based disc protrusion is present. Mild facet hypertrophy and ligamentum flavum thickening is present. This results in moderate central canal stenosis with crowding of the nerve roots. Subarticular narrowing is worse right than left. Moderate right and mild left foraminal stenosis is present. L3-4: A leftward disc protrusion is present. Moderate facet hypertrophy is noted bilaterally. Mild subarticular narrowing is present bilaterally. Mild foraminal narrowing is present bilaterally. L4-5: A broad-based disc protrusion is present. Moderate facet hypertrophy and ligamentum flavum thickening results in severe central canal stenosis. Moderate left and mild right foraminal narrowing is present. L5-S1: A broad-based disc protrusion is present. Moderate subarticular stenosis is present bilaterally. Moderate left and mild right foraminal stenosis is present IMPRESSION: 1. Severe central canal stenosis at L4-5 secondary to a broad-based disc protrusion, facet hypertrophy, and ligamentum flavum thickening. 2. Moderate left and mild right foraminal stenosis at L4-5. 3. Moderate central canal stenosis with crowding of the nerve roots at L2-3. Subarticular narrowing is worse right than left. Moderate right and mild left foraminal stenosis is present. 4. Moderate  subarticular stenosis bilaterally at L5-S1. Moderate left and mild right foraminal stenosis is present. 5. Mild subarticular narrowing bilaterally at L3-4. 6. Mild foraminal narrowing bilaterally at L3-4. Electronically Signed   By: Lonni Necessary M.D.   On: 08/24/2023 18:02        Scheduled Meds:  aspirin  EC  81 mg Oral Daily   calcium  carbonate  1 tablet Oral BID WC   cholecalciferol   2,000 Units Oral Daily   docusate sodium   100 mg Oral Daily   dorzolamide   1 drop Both Eyes BID   enoxaparin  (LOVENOX ) injection  30 mg Subcutaneous Q24H   ketorolac   15 mg Intravenous Q6H   latanoprost   1 drop Both Eyes QHS   levothyroxine   100 mcg Oral QAC breakfast   lidocaine   1 patch Transdermal Q24H   magnesium  oxide  400 mg Oral Daily   methocarbamol   500 mg Oral TID   nortriptyline   25 mg Oral QHS   pantoprazole   40 mg Oral Daily   pravastatin   40 mg Oral q1800   pregabalin   50 mg Oral BID   umeclidinium bromide   1 puff Inhalation Daily   Continuous Infusions:  sodium chloride  100 mL/hr at 08/26/23 0850     LOS: 1 day    Calvin KATHEE Robson, MD [facility]   If 7PM-7AM, please contact night-coverage  08/26/2023, 10:41 AM

## 2023-08-26 NOTE — Evaluation (Signed)
 Physical Therapy Evaluation Patient Details Name: Robyn Butler MRN: [id number] DOB: 05-Nov-1928 Today's Date: 08/26/2023  History of Present Illness  Robyn Butler is a 89yoF who [facility] with worsenign back pain x 3 days.  patient has had significant decrease of activity because of fear of more back pain as result she mainly stays in bed for last 2 days and has had significant decrease of oral intake because of the back pain.  1 week ago patient started develop productive cough with yellowish sputum production and shortness of breath and wheezing  PMH: COPD, HTN, HLD, hypoTSH, CKD3a, osteoporosis. WOrk revealing on T7 compression fracture, TLSO ordered and at bedside.  Clinical Impression  Pt in bed asleep, DTR at bedside. Pt reports comfortable when not moving, otherwise pain remains fairly intense. Pt able to come to EOB with minA, then pt/DTR educated on TLSO donning instructions. Pt unable to sit unsupported for very long, does progressively grow weak and tired, I suspect partially due to trial period on room air : at 86% when checked, then moved back to nasal canual. Pt does well with coming to standing with RW, the able to move easily to recliner. AMB deferred due to complaint of extreme tiredness. Pt set up for lunch tray en route. DTR oriented to menu and order process. PT comfortable at exit, all needs met. Pt does not have 24/7 assist available, DTR works- will need help with basic ADL including TLSO management due to intense pain- STR would offer a great option until pt is able to progress back to home.       If plan is discharge home, recommend the following: A little help with walking and/or transfers;A lot of help with bathing/dressing/bathroom;Help with stairs or ramp for entrance;Assist for transportation;Assistance with cooking/housework;Direct supervision/assist for financial management   Can travel by private vehicle   No    Equipment Recommendations None  recommended by PT  Recommendations for Other Services       Functional Status Assessment Patient has had a recent decline in their functional status and demonstrates the ability to make significant improvements in function in a reasonable and predictable amount of time.     Precautions / Restrictions Precautions Precautions: Fall;Back Precaution Booklet Issued: No Required Braces or Orthoses: Spinal Brace Spinal Brace: Thoracolumbosacral orthotic Restrictions Weight Bearing Restrictions Per Provider Order: No      Mobility  Bed Mobility Overal bed mobility: Needs Assistance Bed Mobility: Supine to Sit, Sit to Supine     Supine to sit: Min assist     General bed mobility comments: puts forth remarkable effort despite pain, uses bed rail as she does at home + 1 hand assist from author    Transfers Overall transfer level: Needs assistance Equipment used: Rolling walker (2 wheels) Transfers: Sit to/from Stand, Bed to chair/wheelchair/BSC Sit to Stand: Supervision   Step pivot transfers: Supervision       General transfer comment: stands fairly well, no LOB, moderate effort or less    Ambulation/Gait Ambulation/Gait assistance:  (deferred due to steadily fatigued apeparsance after EOB x5 minutes)                Stairs            Wheelchair Mobility     Tilt Bed    Modified Rankin (Stroke Patients Only)       Balance  Pertinent Vitals/Pain Pain Assessment Pain Assessment: Faces Faces Pain Scale: Hurts even more Pain Location: mid back when moving (comfortable when sitting still)    Home Living Family/patient expects to be discharged to:: Private residence Living Arrangements: Alone Available Help at Discharge: Family (DTR /friends check in regulalry) Type of Home: Apartment Home Access: Level entry       Home Layout: One level Home Equipment: Agricultural Consultant (2  wheels);Rollator (4 wheels);Cane - single point      Prior Function               Mobility Comments: limited community distance AMB in senior apartment facility, [facility] or rollator if sciatica is bad; groceries with DTR and will push buggy. ADLs Comments: independent with ADL ; no falls in 6 months     Extremity/Trunk Assessment                Communication      Cognition Arousal: Alert (sleepy at times, correlate periodic hypoxia) Behavior During Therapy: WFL for tasks assessed/performed Overall Cognitive Status: Within Functional Limits for tasks assessed                                          General Comments      Exercises     Assessment/Plan    PT Assessment Patient needs continued PT services  PT Problem List Decreased strength;Decreased range of motion;Decreased activity tolerance;Decreased balance;Decreased mobility;Decreased coordination;Decreased knowledge of use of DME       PT Treatment Interventions DME instruction;Gait training;Patient/family education;Stair training;Functional mobility training;Therapeutic activities;Therapeutic exercise;Balance training;Neuromuscular re-education    PT Goals (Current goals can be found in the Care Plan section)  Acute Rehab PT Goals Patient Stated Goal: regain independence and mobility PT Goal Formulation: With patient Time For Goal Achievement: 09/09/23 Potential to Achieve Goals: Good    Frequency 7X/week     Co-evaluation               AM-PAC PT 6 Clicks Mobility  Outcome Measure Help needed turning from your back to your side while in a flat bed without using bedrails?: A Lot Help needed moving from lying on your back to sitting on the side of a flat bed without using bedrails?: A Lot Help needed moving to and from a bed to a chair (including a wheelchair)?: A Little Help needed standing up from a chair using your arms (e.g., wheelchair or bedside chair)?: A Little Help  needed to walk in hospital room?: A Little Help needed climbing 3-5 steps with a railing? : A Little 6 Click Score: 16    End of Session Equipment Utilized During Treatment: Oxygen  Activity Tolerance: Patient tolerated treatment well;Patient limited by pain;Patient limited by lethargy Patient left: in chair;with call bell/phone within reach;with family/visitor present Nurse Communication: Mobility status PT Visit Diagnosis: Unsteadiness on feet (R26.81);Difficulty in walking, not elsewhere classified (R26.2);Other abnormalities of gait and mobility (R26.89);Repeated falls (R29.6);Muscle weakness (generalized) (M62.81);History of falling (Z91.81)    Time: 8761-8687 PT Time Calculation (min) (ACUTE ONLY): 34 min   Charges:   PT Evaluation $PT Eval Moderate Complexity: 1 Mod PT Treatments $Therapeutic Activity: 8-22 mins PT General Charges $$ ACUTE PT VISIT: 1 Visit        3:59 PM, 08/26/23 Peggye JAYSON Linear, PT, DPT Physical Therapist - [facility]  [phone] (ASCOM)   Millena Callins C 08/26/2023,  3:55 PM

## 2023-08-26 NOTE — Progress Notes (Signed)
 Orthopedic Tech Progress Note Patient Details:  Robyn Butler 06-08-1929 [id number]  Patient ID: [name] Needs, female   DOB: 1929-03-07, 89 y.o.   MRN: [id number] TLSO ordered. Toi Foster 08/26/2023, 8:19 AM

## 2023-08-26 NOTE — Plan of Care (Signed)

## 2023-08-27 DIAGNOSIS — S22060A Wedge compression fracture of T7-T8 vertebra, initial encounter for closed fracture: Secondary | ICD-10-CM | POA: Diagnosis not present

## 2023-08-27 MED ORDER — CEFAZOLIN SODIUM-DEXTROSE 2-4 GM/100ML-% IV SOLN
2.0000 g | INTRAVENOUS | Status: AC
  Administered 2023-08-28: 2 g via INTRAVENOUS

## 2023-08-27 NOTE — Evaluation (Signed)
 Occupational Therapy Evaluation Patient Details Name: Robyn Butler MRN: [id number] DOB: January 14, 1929 Today's Date: 08/27/2023   History of Present Illness Robyn Butler is a 89yoF who comes to [facility] on 08/25/23 with worsenign back pain x 3 days.  patient has had significant decrease of activity because of fear of more back pain as result she mainly stays in bed for last 2 days and has had significant decrease of oral intake because of the back pain.  1 week ago patient started develop productive cough with yellowish sputum production and shortness of breath and wheezing  PMH: COPD, HTN, HLD, hypoTSH, CKD3a, osteoporosis. Workup revealing on T7 compression fracture, TLSO ordered and at bedside.   Clinical Impression   Chart reviewed, pt greeted in bed, initially difficult to wake and lethargic but improved with mobility. Pt is alert and oriented x4. PTA pt reports she amb with no AD, prn use of rollator, generally MOD I in ADL, PRN assist for IADL. Pt presents with deficits in strength, endurance, activity tolerance, balance affecting safe and optimal ADL completion. MIN A required for bed mobility, MAX A for TLSO application, STS with MIN A with RW, short amb transfer to bedside commode with CGA with RW. Toileting completed with MIN A, LB dressing with MOD A. Pt abm in room with WR approx 25' with CGA. Pt on 3L via [place] throughout, at erst >90%, 89% after mobility in chair, RN notified. Pt will benefit from acute OT to address deficits and to facilitate optimal ADL performance. OT will continue to follow acutely.       If plan is discharge home, recommend the following: A little help with walking and/or transfers;A lot of help with bathing/dressing/bathroom;Help with stairs or ramp for entrance;Assistance with cooking/housework    Functional Status Assessment  Patient has had a recent decline in their functional status and demonstrates the ability to make significant improvements in function in a  reasonable and predictable amount of time.  Equipment Recommendations  Other (comment) (defer to next venue of care)    Recommendations for Other Services       Precautions / Restrictions Precautions Precautions: Fall;Back Precaution Booklet Issued: No Required Braces or Orthoses: Spinal Brace Spinal Brace: Thoracolumbosacral orthotic Restrictions Weight Bearing Restrictions Per Provider Order: No      Mobility Bed Mobility Overal bed mobility: Needs Assistance Bed Mobility: Rolling, Sidelying to Sit Rolling: Min assist, Used rails Sidelying to sit: Min assist, Used rails            Transfers Overall transfer level: Needs assistance Equipment used: Rolling walker (2 wheels) Transfers: Sit to/from Stand, Bed to chair/wheelchair/BSC Sit to Stand: Contact guard assist     Step pivot transfers: Contact guard assist            Balance Overall balance assessment: Needs assistance Sitting-balance support: Feet supported Sitting balance-Leahy Scale: Good     Standing balance support: Single extremity supported, Bilateral upper extremity supported Standing balance-Leahy Scale: Good                             ADL either performed or assessed with clinical judgement   ADL Overall ADL's : Needs assistance/impaired Eating/Feeding: Set up;Sitting   Grooming: Set up;Sitting       Lower Body Bathing: Minimal assistance;Sit to/from stand   Upper Body Dressing : Maximal assistance Upper Body Dressing Details (indicate cue type and reason): TLSO Lower Body Dressing: Sit to/from stand;Moderate assistance Lower  Body Dressing Details (indicate cue type and reason): doff briefs, donn new briefs; MAX A for socks Toilet Transfer: Contact guard assist;Rolling walker (2 wheels);BSC/3in1   Toileting- Clothing Manipulation and Hygiene: Minimal assistance;Sit to/from stand Toileting - Clothing Manipulation Details (indicate cue type and reason): continent urine on  toilet     Functional mobility during ADLs: Contact guard assist;Rolling walker (2 wheels) (approx 25' with RW)       Vision Patient Visual Report: No change from baseline       Perception         Praxis         Pertinent Vitals/Pain Pain Assessment Pain Assessment: Faces Faces Pain Scale: Hurts a little bit Pain Location: mid back when moving, some L hip per pt gesture Pain Descriptors / Indicators: Aching, Grimacing, Guarding Pain Intervention(s): Limited activity within patient's tolerance, Monitored during session, Repositioned     Extremity/Trunk Assessment Upper Extremity Assessment Upper Extremity Assessment: Generalized weakness   Lower Extremity Assessment Lower Extremity Assessment: Generalized weakness       Communication Communication Communication: Hearing impairment Cueing Techniques: Verbal cues;Tactile cues;Visual cues   Cognition Arousal: Alert, Lethargic (initially lethargic, improved with mobility but reports she is sleepy) Behavior During Therapy: WFL for tasks assessed/performed Overall Cognitive Status: Within Functional Limits for tasks assessed                                       General Comments  BP sittong on edge of bed 143/73, and after transferring to commode 146/51. Spo2 89% on 3L via [place] after mobility, RN notified.    Exercises Other Exercises Other Exercises: edu re: role of OT, role of rehab, discharge recommendations   Shoulder Instructions      Home Living Family/patient expects to be discharged to:: Private residence Living Arrangements: Alone Available Help at Discharge: Family;Other (Comment);Available PRN/intermittently;Friend(s) (daughter/friends check in) Type of Home: Apartment Home Access: Level entry     Home Layout: One level     Bathroom Shower/Tub: Chief Strategy Officer: Standard     Home Equipment: Agricultural consultant (2 wheels);Rollator (4 wheels);Cane - single point;Shower  seat          Prior Functioning/Environment Prior Level of Function : Independent/Modified Independent;Needs assist             Mobility Comments: amb with no AD household distances, PRN use of rollator ADLs Comments: MOD I in ADL, prn assist for IADL        OT Problem List: Decreased strength;Decreased activity tolerance;Decreased knowledge of use of DME or AE;Impaired balance (sitting and/or standing)      OT Treatment/Interventions: Self-care/ADL training;DME and/or AE instruction;Therapeutic activities;Balance training;Therapeutic exercise;Energy conservation;Patient/family education    OT Goals(Current goals can be found in the care plan section) Acute Rehab OT Goals Patient Stated Goal: improve function OT Goal Formulation: With patient Time For Goal Achievement: 09/10/23 Potential to Achieve Goals: Good ADL Goals Pt Will Perform Grooming: with modified independence;sitting;standing Pt Will Perform Lower Body Dressing: with modified independence;sitting/lateral leans;sit to/from stand Pt Will Transfer to Toilet: with modified independence;ambulating Pt Will Perform Toileting - Clothing Manipulation and hygiene: with modified independence;sitting/lateral leans;sit to/from stand  OT Frequency: Min 1X/week    Co-evaluation PT/OT/SLP Co-Evaluation/Treatment: Yes Reason for Co-Treatment: To address functional/ADL transfers;For patient/therapist safety PT goals addressed during session: Mobility/safety with mobility;Balance OT goals addressed during session: ADL's and self-care;Proper use of  Adaptive equipment and DME      AM-PAC OT "6 Clicks" Daily Activity     Outcome Measure Help from another person eating meals?: None Help from another person taking care of personal grooming?: None Help from another person toileting, which includes using toliet, bedpan, or urinal?: A Little Help from another person bathing (including washing, rinsing, drying)?: A Lot Help from  another person to put on and taking off regular upper body clothing?: A Lot Help from another person to put on and taking off regular lower body clothing?: A Lot 6 Click Score: 17   End of Session Equipment Utilized During Treatment: Gait belt;Rolling walker (2 wheels) Nurse Communication: Mobility status (vitals)  Activity Tolerance: Patient tolerated treatment well Patient left: in chair;with call bell/phone within reach;with chair alarm set  OT Visit Diagnosis: Other abnormalities of gait and mobility (R26.89);Muscle weakness (generalized) (M62.81);Unsteadiness on feet (R26.81)                Time: 1478-2956 OT Time Calculation (min): 31 min Charges:  OT General Charges $OT Visit: 1 Visit OT Evaluation $OT Eval Moderate Complexity: 1 Mod  ,Gerre Kraft, OTD OTR/L  08/27/23, 1:26 PM

## 2023-08-27 NOTE — Plan of Care (Signed)
   Problem: Education: Goal: Knowledge of General Education information will improve Description: Including pain rating scale, medication(s)/side effects and non-pharmacologic comfort measures Outcome: Progressing   Problem: Clinical Measurements: Goal: Will remain free from infection Outcome: Progressing   Problem: Activity: Goal: Risk for activity intolerance will decrease Outcome: Progressing

## 2023-08-27 NOTE — Consult Note (Signed)
Chief Complaint: Patient was seen in consultation today for  Chief Complaint  Patient presents with   Back Pain   Referring Physician(s): Dr. Georgeann Oppenheim  Supervising Physician: Richarda Overlie  Patient Status: [facility] - In-pt  History of Present Illness: Robyn Butler is a 89 y.o. female with a medical history significant for COPD, HTN, chronic kidney disease and osteoporosis. She presented to the ED 08/25/23 with complaints of worsening back pain. She felt a sharp pain in her back while she was putting new sheets on her bed. She was first evaluated by her PCP and was given a lidocaine patch but the pain was not well controlled. Imaging in the ED showed a T7 compression fracture.   MR Thoracic Spine 08/24/23 IMPRESSION: 1. Acute vertebral plana compression fracture at T7 with height loss of less than 8 mm. 2. Bony elements retropulsed into the spinal canal at T7 efface the ventral surface of the cord. No abnormal signal is present. 3. No significant thoracic disc disease or central canal stenosis.  The patient has experienced a significant decrease in her ability to care for herself and tend to her activities of daily living. Interventional Radiology has been asked to evaluate this patient for an image-guided T7 Kyphoplasty/Vertebroplasty. Imaging reviewed and procedure approved by Dr. Loreta Ave.   Past Medical History:  Diagnosis Date   Arthritis    Cancer (HCC)    Chronic kidney disease    COPD (chronic obstructive pulmonary disease) (HCC)    Diabetes mellitus without complication (HCC)    type 2   Hypertension    Varicose veins of lower extremities with other complications     Past Surgical History:  Procedure Laterality Date   ABDOMINAL HYSTERECTOMY  1972   BLADDER SUSPENSION     BREAST EXCISIONAL BIOPSY Right 1999   neg   CHOLECYSTECTOMY  1975   COLONOSCOPY     COLONOSCOPY WITH PROPOFOL N/A 05/24/2015   Procedure: COLONOSCOPY WITH PROPOFOL;  Surgeon: Wallace Cullens, MD;   Location: [facility] ENDOSCOPY;  Service: Gastroenterology;  Laterality: N/A;   CYSTOSCOPY  1972   ESOPHAGOGASTRODUODENOSCOPY N/A 05/24/2015   Procedure: ESOPHAGOGASTRODUODENOSCOPY (EGD);  Surgeon: Wallace Cullens, MD;  Location: [facility] ENDOSCOPY;  Service: Gastroenterology;  Laterality: N/A;   FRACTURE SURGERY     JOINT REPLACEMENT Right 2013   hip   salpingo oophorectmy      THYROIDECTOMY  1969   WRIST FRACTURE SURGERY Left     Allergies: Alendronate  Medications: Prior to Admission medications   Medication Sig Start Date End Date Taking? Authorizing Provider  albuterol (VENTOLIN HFA) 108 (90 Base) MCG/ACT inhaler Inhale 2 puffs into the lungs every 6 (six) hours as needed for wheezing or shortness of breath. 08/18/23  Yes Ray, Danie Binder, MD  amLODipine (NORVASC) 5 MG tablet TAKE ONE(1) TABLET EACH DAY Patient taking differently: Take 5 mg by mouth daily. 03/22/19  Yes Malva Limes, MD  aspirin EC 81 MG tablet Take 81 mg by mouth daily.    Yes [provider]  Cholecalciferol (VITAMIN D3) 50 MCG (2000 UT) TABS Take 1 tablet by mouth daily.   Yes [provider]  cyanocobalamin 1000 MCG tablet Take 1,000 mcg by mouth daily.   Yes [provider]  dorzolamide (TRUSOPT) 2 % ophthalmic solution Place 1 drop into both eyes 2 (two) times daily.    Yes [provider]  DULoxetine (CYMBALTA) 30 MG capsule Take 30 mg by mouth daily. 07/26/23 07/25/24 Yes [provider]  ibandronate (BONIVA) 150 MG tablet TAKE ONE TABLET ONCE A MONTH FIRST THING IN THE MORNING AT LEAST 1 HOUR BEFORE EATING, TAKE WITH WATER. 03/07/19  Yes Malva Limes, MD  latanoprost (XALATAN) 0.005 % ophthalmic solution Place 1 drop into both eyes at bedtime.    Yes [provider]  levothyroxine (SYNTHROID) 100 MCG tablet TAKE 1 TABLET BY MOUTH EVERY DAY Patient taking differently: Take 100 mcg by mouth daily before breakfast. 03/22/19  Yes Malva Limes, MD  loperamide (IMODIUM) 2 MG  capsule Take 2 mg by mouth as needed for diarrhea or loose stools.   Yes [provider]  lovastatin (MEVACOR) 40 MG tablet TAKE 1 TABLET BY MOUTH AT BEDTIME 08/28/19  Yes Malva Limes, MD  magnesium oxide (MAG-OX) 400 MG tablet Take 400 mg by mouth daily.   Yes [provider]  Multiple Vitamins-Minerals (PRESERVISION AREDS 2) CAPS Take 1 capsule by mouth 2 (two) times daily.   Yes [provider]  nortriptyline (PAMELOR) 25 MG capsule TAKE 1 CAPSULE(25 MG) BY MOUTH AT BEDTIME 08/16/19  Yes Malva Limes, MD  omeprazole (PRILOSEC) 40 MG capsule Take 1 capsule (40 mg total) by mouth daily. 09/17/18  Yes Malva Limes, MD  oxybutynin (DITROPAN-XL) 5 MG 24 hr tablet Take 5 mg by mouth daily. 08/06/23  Yes [provider]  oxyCODONE (OXY IR/ROXICODONE) 5 MG immediate release tablet Take 5 mg by mouth every 4 (four) hours as needed for moderate pain (pain score 4-6). 08/23/23 09/22/23 Yes [provider]  Polyethylene Glycol 3350 (DULCOLAX BALANCE PO) Take 2 tablets by mouth as needed.   Yes [provider]  pregabalin (LYRICA) 50 MG capsule Take 50 mg by mouth 2 (two) times daily. 07/17/23 07/16/24 Yes [provider]  SPIRIVA RESPIMAT 2.5 MCG/ACT AERS SMARTSIG:2 Puff(s) By Mouth Daily 06/09/19  Yes [provider]  valsartan-hydrochlorothiazide (DIOVAN-HCT) 160-25 MG tablet Take 1 tablet by mouth daily. 09/17/18  Yes Malva Limes, MD  zinc sulfate 220 (50 Zn) MG capsule Take 220 mg by mouth daily.   Yes [provider]  Cranberry 500 MG CAPS Take 500 mg by mouth daily. Patient not taking: Reported on 08/25/2023    [provider]     Family History  Problem Relation Age of Onset   Heart attack Father    Asthma Father    Kidney disease Mother    Hypertension Mother    Hypertension Other    Diabetes Other    Heart attack Other    Breast cancer Neg Hx     Social History   Socioeconomic History    Marital status: Widowed    Spouse name: Not on file   Number of children: 2   Years of education: 9th Grade   Highest education level: 9th grade  Occupational History   Occupation: Retired  Tobacco Use   Smoking status: Former    Current packs/day: 0.00    Types: Cigarettes    Quit date: 07/18/1991    Years since quitting: 32.1   Smokeless tobacco: Never  Vaping Use   Vaping status: Never Used  Substance and Sexual Activity   Alcohol use: No   Drug use: No   Sexual activity: Not Currently  Other Topics Concern   Not on file  Social History Narrative   In [place] in senior place; quit smoking [> 25 years ago]; no alcohol; hosiery.    Social Drivers of Corporate investment banker  Strain: Low Risk  (06/11/2023)   Received from [facility]   Overall Financial Resource Strain (CARDIA)    Difficulty of Paying Living Expenses: Not hard at all  Food Insecurity: No Food Insecurity (08/26/2023)   Hunger Vital Sign    Worried About Running Out of Food in the Last Year: Never true    Ran Out of Food in the Last Year: Never true  Transportation Needs: No Transportation Needs (08/25/2023)   PRAPARE - Administrator, Civil Service (Medical): No    Lack of Transportation (Non-Medical): No  Physical Activity: Inactive (12/24/2018)   Exercise Vital Sign    Days of Exercise per Week: 0 days    Minutes of Exercise per Session: 0 min  Stress: No Stress Concern Present (12/21/2017)   [employer] of Occupational Health - Occupational Stress Questionnaire    Feeling of Stress : Not at all  Social Connections: Socially Isolated (08/25/2023)   Social Connection and Isolation Panel [NHANES]    Frequency of Communication with Friends and Family: Once a week    Frequency of Social Gatherings with Friends and Family: Once a week    Attends Religious Services: 1 to 4 times per year    Active Member of [employer] or Organizations: No    Attends Banker Meetings:  Never    Marital Status: Widowed    Review of Systems: A 12 point ROS discussed and pertinent positives are indicated in the HPI above.  All other systems are negative.  Review of Systems  Unable to perform ROS: Other  Constitutional:  Positive for fatigue.  Musculoskeletal:  Positive for back pain.  All other systems reviewed and are negative.   Vital Signs: BP (!) 134/35 (BP Location: Left Arm) Comment: Pt's baseline to have diastolyic in the 35-40's. pt was evaluated.She is asymptomatic  Pulse 68   Temp 98.9 F (37.2 C)   Resp 16   Ht 5\' 3"  (1.6 m)   Wt 116 lb (52.6 kg)   SpO2 93%   BMI 20.55 kg/m   Physical Exam Constitutional:      General: She is not in acute distress.    Comments: Somnolent but able to answer questions appropriately.   HENT:     Mouth/Throat:     Mouth: Mucous membranes are moist.     Pharynx: Oropharynx is clear.  Cardiovascular:     Rate and Rhythm: Normal rate.     Pulses: Normal pulses.  Pulmonary:     Effort: Pulmonary effort is normal.  Abdominal:     Tenderness: There is no abdominal tenderness.  Skin:    General: Skin is warm and dry.  Neurological:     Comments: Appears oriented x 3. She is able to respond appropriately to questions.      Imaging: MR THORACIC SPINE WO CONTRAST Result Date: 08/24/2023 CLINICAL DATA:  Mid back pain after coughing episode 1 week ago. EXAM: MRI THORACIC SPINE WITHOUT CONTRAST TECHNIQUE: Multiplanar, multisequence MR imaging of the thoracic spine was performed. No intravenous contrast was administered. COMPARISON:  None Available. FINDINGS: Alignment: No significant listhesis is present. Thoracic kyphosis is slightly exaggerated. Vertebrae: Acute vertebral plana compression fracture is present at T7. Height is narrowed to less than 8 mm. This compares to 16 mm at the adjacent level. Marrow signal and vertebral body heights are otherwise normal. Cord: Bony elements retropulsed into the spinal canal at T7  efface the ventral surface of the cord. No abnormal  signal is present. Cord signal and morphology is otherwise normal. Paraspinal and other soft tissues: The paraspinous soft tissues within normal limits. Disc levels: No significant thoracic disc disease or central canal stenosis is present. The foramina are patent bilaterally. IMPRESSION: 1. Acute vertebral plana compression fracture at T7 with height loss of less than 8 mm. 2. Bony elements retropulsed into the spinal canal at T7 efface the ventral surface of the cord. No abnormal signal is present. 3. No significant thoracic disc disease or central canal stenosis. Electronically Signed   By: Marin Roberts M.D.   On: 08/24/2023 18:07   MR LUMBAR SPINE WO CONTRAST Result Date: 08/24/2023 CLINICAL DATA:  Mid back pain after coughing episode 1 week ago. EXAM: MRI LUMBAR SPINE WITHOUT CONTRAST TECHNIQUE: Multiplanar, multisequence MR imaging of the lumbar spine was performed. No intravenous contrast was administered. COMPARISON:  Lumbar spine radiographs 08/05/2015 FINDINGS: Segmentation: 5 non rib-bearing lumbar type vertebral bodies are present. The lowest fully formed vertebral body is L5. Alignment: No significant listhesis is present. Straightening of the normal lumbar lordosis is present. Mild leftward curvature is centered at L3. Vertebrae: Mild edematous endplate changes are present in association with the superior endplate Schmorl's node at L5. Type 2 Modic changes are present at L2-3 and L3-4. Marrow signal and vertebral body heights are otherwise normal. Conus medullaris and cauda equina: Conus extends to the L1 level. Conus and cauda equina appear normal. Paraspinal and other soft tissues: Limited imaging the abdomen is unremarkable. There is no significant adenopathy. No solid organ lesions are present. Disc levels: L1-2: The globes and orbits are within normal limits. L2-3: A large broad-based disc protrusion is present. Mild facet hypertrophy  and ligamentum flavum thickening is present. This results in moderate central canal stenosis with crowding of the nerve roots. Subarticular narrowing is worse right than left. Moderate right and mild left foraminal stenosis is present. L3-4: A leftward disc protrusion is present. Moderate facet hypertrophy is noted bilaterally. Mild subarticular narrowing is present bilaterally. Mild foraminal narrowing is present bilaterally. L4-5: A broad-based disc protrusion is present. Moderate facet hypertrophy and ligamentum flavum thickening results in severe central canal stenosis. Moderate left and mild right foraminal narrowing is present. L5-S1: A broad-based disc protrusion is present. Moderate subarticular stenosis is present bilaterally. Moderate left and mild right foraminal stenosis is present IMPRESSION: 1. Severe central canal stenosis at L4-5 secondary to a broad-based disc protrusion, facet hypertrophy, and ligamentum flavum thickening. 2. Moderate left and mild right foraminal stenosis at L4-5. 3. Moderate central canal stenosis with crowding of the nerve roots at L2-3. Subarticular narrowing is worse right than left. Moderate right and mild left foraminal stenosis is present. 4. Moderate subarticular stenosis bilaterally at L5-S1. Moderate left and mild right foraminal stenosis is present. 5. Mild subarticular narrowing bilaterally at L3-4. 6. Mild foraminal narrowing bilaterally at L3-4. Electronically Signed   By: Marin Roberts M.D.   On: 08/24/2023 18:02   DG Chest 1 View Result Date: 08/17/2023 CLINICAL DATA:  Cough chest pain EXAM: CHEST  1 VIEW COMPARISON:  02/26/2022 FINDINGS: Linear scarring at the left base. No consolidation or pleural effusion. Stable cardiomediastinal silhouette with aortic atherosclerosis. IMPRESSION: No active disease. Linear scarring at the left base. Electronically Signed   By: Jasmine Pang M.D.   On: 08/17/2023 20:03    Labs:  CBC: Recent Labs     08/17/23 1839 08/23/23 1521 08/25/23 1213 08/26/23 0418  WBC 11.4* 12.0* 20.5* 13.0*  HGB 9.8* 10.7*  10.3* 9.1*  HCT 32.7* 34.8* 33.5* 28.8*  PLT 380 378 320 281    COAGS: No results for input(s): "INR", "APTT" in the last 8760 hours.  BMP: Recent Labs    07/23/23 1512 08/17/23 1839 08/25/23 1213 08/26/23 0418  NA 140 137 132* 135  K 4.5 4.5 4.6 4.7  CL 106 101 94* 100  CO2 27 21* 25 26  GLUCOSE 99 148* 168* 134*  BUN 33* 24* 50* 40*  CALCIUM 8.9 8.8* 8.4* 8.3*  CREATININE 1.11* 1.06* 1.70* 1.33*  GFRNONAA 47* 49* 28* 38*    LIVER FUNCTION TESTS: No results for input(s): "BILITOT", "AST", "ALT", "ALKPHOS", "PROT", "ALBUMIN" in the last 8760 hours.  TUMOR MARKERS: No results for input(s): "AFPTM", "CEA", "CA199", "CHROMGRNA" in the last 8760 hours.  Assessment and Plan:  T7 Compression fracture: Yehudis B. Tourigny, 89 year old female, is tentatively scheduled 08/28/23 for an image-guided thoracic 7 kyphoplasty. The procedure was discussed at the bedside with the patient who was able to give verbal consent. Telephone consent was obtained from the patient's daughter Salina April.   Risks and benefits of T7 kyphoplasty/vertebroplasty were discussed with the patient including, but not limited to education regarding the natural healing process of compression fractures without intervention, bleeding, infection, cement migration which may cause spinal cord damage, paralysis, pulmonary embolism or even death.  This interventional procedure involves the use of X-rays and because of the nature of the planned procedure, it is possible that we will have prolonged use of X-ray fluoroscopy. Potential radiation risks to you include (but are not limited to) the following: - A slightly elevated risk for cancer  several years later in life. This risk is typically less than 0.5% percent. This risk is low in comparison to the normal incidence of human cancer, which is 33% for women and 50% for  men according to the American Cancer Society. - Radiation induced injury can include skin redness, resembling a rash, tissue breakdown / ulcers and hair loss (which can be temporary or permanent).  The likelihood of either of these occurring depends on the difficulty of the procedure and whether you are sensitive to radiation due to previous procedures, disease, or genetic conditions.   IF your procedure requires a prolonged use of radiation, you will be notified and given written instructions for further action.  It is your responsibility to monitor the irradiated area for the 2 weeks following the procedure and to notify your physician if you are concerned that you have suffered a radiation induced injury.    All of the patient's questions were answered, patient is agreeable to proceed. She will be NPO at midnight. Morning labs ordered. She is not receiving any blood-thinning medications.   Consent signed and in the IR control room.   Thank you for this interesting consult.  I greatly enjoyed meeting Monetta B Trull and look forward to participating in their care.  A copy of this report was sent to the requesting provider on this date.  Electronically Signed: Alwyn Ren, AGACNP-BC 08/27/2023, 1:18 PM   I spent a total of 20 Minutes    in face to face in clinical consultation, greater than 50% of which was counseling/coordinating care for T7 KP.

## 2023-08-27 NOTE — Progress Notes (Signed)
 Physical Therapy Treatment Patient Details Name: Robyn Butler MRN: [id number] DOB: 12/16/1928 Today's Date: 08/27/2023   History of Present Illness Robyn Butler is a 89yoF who comes to [facility]23 with worsenign back pain x 3 days.  patient has had significant decrease of activity because of fear of more back pain as result she mainly stays in bed for last 2 days and has had significant decrease of oral intake because of the back pain.  1 week ago patient started develop productive cough with yellowish sputum production and shortness of breath and wheezing  PMH: COPD, HTN, HLD, hypoTSH, CKD3a, osteoporosis. Workup revealing on T7 compression fracture, TLSO ordered and at bedside.    PT Comments  Pt woken by OT, did tend to speak with her eyes closed, and return to resting with eyes closed when not interacted with. spO2 on 2.5-3L 87-90%, RN notified. MinA with bed rails and max facilitation to encourage log roll, noted to appear shaky and potentially orthostatic in sitting. BP 143/73, and after transferring to commode 146/51. TLSO donned and adjusted, totalA. Noted for improved interaction and steadiness over time. She was able to stand ambulate to recliner in room, and after a seated rest break increased her distance ~61ft with RW and CGA. Pt did report significant fatigue after ambulation. Up in chair with breakfast in reach at end of session. The patient would benefit from further skilled PT intervention to continue to progress towards goals.     If plan is discharge home, recommend the following: A little help with walking and/or transfers;A lot of help with bathing/dressing/bathroom;Help with stairs or ramp for entrance;Assist for transportation;Assistance with cooking/housework;Direct supervision/assist for financial management   Can travel by private vehicle     No  Equipment Recommendations  None recommended by PT    Recommendations for Other Services       Precautions / Restrictions  Precautions Precautions: Fall;Back Precaution Booklet Issued: No Spinal Brace: Thoracolumbosacral orthotic Restrictions Weight Bearing Restrictions Per Provider Order: No     Mobility  Bed Mobility Overal bed mobility: Needs Assistance Bed Mobility: Supine to Sit, Sit to Supine     Supine to sit: Min assist, Used rails     General bed mobility comments: step by step cueing/assistance to help maintain log roll technique with fair carryover    Transfers Overall transfer level: Needs assistance Equipment used: Rolling walker (2 wheels) Transfers: Sit to/from Stand, Bed to chair/wheelchair/BSC Sit to Stand: Contact guard assist   Step pivot transfers: Contact guard assist            Ambulation/Gait Ambulation/Gait assistance: Contact guard assist Gait Distance (Feet): 25 Feet Assistive device: Rolling walker (2 wheels)         General Gait Details: pt reported a lot of fatigue after ambulation bout   Stairs             Wheelchair Mobility     Tilt Bed    Modified Rankin (Stroke Patients Only)       Balance Overall balance assessment: Needs assistance Sitting-balance support: Feet supported Sitting balance-Leahy Scale: Good     Standing balance support: Single extremity supported, Bilateral upper extremity supported Standing balance-Leahy Scale: Good Standing balance comment: able to assist with brief and pants donning in standing                            Cognition Arousal: Alert (sleepy intermittently, does return to resting eye  closed when not interacted with) Behavior During Therapy: WFL for tasks assessed/performed Overall Cognitive Status: Within Functional Limits for tasks assessed                                          Exercises      General Comments        Pertinent Vitals/Pain Pain Assessment Pain Assessment: Faces Faces Pain Scale: Hurts a little bit Pain Location: mid back when moving,  some L hip per pt gesture Pain Descriptors / Indicators: Aching, Grimacing, Guarding Pain Intervention(s): Limited activity within patient's tolerance, Monitored during session, Repositioned    Home Living                          Prior Function            PT Goals (current goals can now be found in the care plan section) Progress towards PT goals: Progressing toward goals    Frequency    7X/week      PT Plan      Co-evaluation PT/OT/SLP Co-Evaluation/Treatment: Yes Reason for Co-Treatment: To address functional/ADL transfers;For patient/therapist safety PT goals addressed during session: Mobility/safety with mobility;Balance OT goals addressed during session: ADL's and self-care;Proper use of Adaptive equipment and DME      AM-PAC PT "6 Clicks" Mobility   Outcome Measure  Help needed turning from your back to your side while in a flat bed without using bedrails?: A Lot Help needed moving from lying on your back to sitting on the side of a flat bed without using bedrails?: A Lot Help needed moving to and from a bed to a chair (including a wheelchair)?: A Little Help needed standing up from a chair using your arms (e.g., wheelchair or bedside chair)?: A Little Help needed to walk in hospital room?: A Little Help needed climbing 3-5 steps with a railing? : A Little 6 Click Score: 16    End of Session Equipment Utilized During Treatment: Oxygen Activity Tolerance: Patient tolerated treatment well;Patient limited by fatigue Patient left: in chair;with call bell/phone within reach;with chair alarm set Nurse Communication: Mobility status PT Visit Diagnosis: Unsteadiness on feet (R26.81);Difficulty in walking, not elsewhere classified (R26.2);Other abnormalities of gait and mobility (R26.89);Repeated falls (R29.6);Muscle weakness (generalized) (M62.81);History of falling (Z91.81)     Time: 7829-5621 PT Time Calculation (min) (ACUTE ONLY): 27 min  Charges:     $Therapeutic Activity: 8-22 mins PT General Charges $$ ACUTE PT VISIT: 1 Visit                     Darien Eden PT, DPT 10:15 AM,08/27/23

## 2023-08-27 NOTE — Progress Notes (Signed)
 PROGRESS NOTE    Robyn Butler  ZHY:[id number] DOB: 1928/09/11 DOA: 08/25/2023 PCP: Jimmy Moulding, MD    Brief Narrative:  89 y.o. female with medical history significant of COPD, HTN, HLD, hypothyroidism, CKD stage IIIa, osteoporosis, presented with worsening of back pain.   Patient lives by herself and at bedside with steady gait.  3 days ago, patient tried deep to corner of her mattress similarly developed a 10/10 sharp like back pain.  Denies any chest pain or abdominal pain or leg pain. But she does have chronic constipation and urinary incontinence.  Went to see PCP yesterday and was given lidocaine  patch however pain well-controlled.  MRI of patient was done which showed T7 compression fracture.  Last 2 days, patient's neighbor has been helping her however patient has had significant decrease of activity because of fear of more back pain as result she mainly stays in bed for last 2 days and has had significant decrease of oral intake because of the back pain.  1 week ago patient started develop productive cough with yellowish sputum production and shortness of breath and wheezing came to ED when she was diagnosed with bronchitis and COPD exacerbation and sent home with antibiotics and prednisone  tapering doses and inhalers.  She reported significant improvement of shortness of breath and wheezing and the cough has become dry.  No fever or chills.   Assessment & Plan:   Principal Problem:   Thoracic compression fracture (HCC)  T7 vertebral compression fracture Pathologic fracture -Secondary to severe osteoporosis -Neuro exam benign -Patient already on biphosphonate Plan: Pain control Calcium  and vitamin D  supplementation TLSO brace Therapy evaluations Consult placed to consider kyphoplasty Will need TOC involvement for skilled nursing facility placement  Leukocytosis -Patient came to the hospital 7 days ago for COPD exacerbation and bronchitis and was discharged from ED  with Augmentin  and steroids.  Patient reported improvement of breathing symptoms after taking 7 days of above medications.  Clinically patient has no signs of active infection, suspect leukocytosis secondary to steroid use.  Morphology of WBC appear to be within normal limits.   AKI on CKD stage IIIa -Clinically patient appeared to be dehydrated secondary to poor p.o. intake -Improving over interval -IV fluids discontinued   COPD -Incentive spirometry -DuoNebs as needed   Hx of lumbar radiculopathy -Lumbar MRI reviewed showing chronic changes.   Osteoporosis -Continue biphosphate   HTN -Hold off home BP meds -Start as needed hydralazine    DVT prophylaxis: SQ lovenox  Code Status: DNR Family Communication:Daughter Leola Raisin via phone [phone] Disposition Plan: Status is: Inpatient Remains inpatient appropriate because: Intractable pain, thoracic vertebra fracture   Level of care: Med-Surg  Consultants:  None  Procedures:  None  Antimicrobials: None    Subjective: Seen and examined.  Back pain appears improved.  Objective: Vitals:   08/26/23 1309 08/26/23 1619 08/27/23 0015 08/27/23 0805  BP:  (!) 127/40 (!) 132/48 (!) 134/35  Pulse:  72 75 68  Resp:  16 16 16   Temp:  97.6 F (36.4 C) 97.8 F (36.6 C) 98.9 F (37.2 C)  TempSrc:  Oral    SpO2: 90% 95% 96% 93%  Weight:      Height:        Intake/Output Summary (Last 24 hours) at 08/27/2023 1241 Last data filed at 08/26/2023 1800 Gross per 24 hour  Intake 885.75 ml  Output --  Net 885.75 ml   Filed Weights   08/25/23 1121 08/25/23 1127  Weight: 53 kg 52.6 kg  Examination:  General exam: NAD Respiratory system: Lungs clear.  Normal breathing.  Room air Cardiovascular system: S1-S2, RRR, no murmurs, no pedal edema Gastrointestinal system: Soft, NT/ND, normal bowel sounds Central nervous system: Alert and oriented. No focal neurological deficits. Extremities: Decreased power bilateral lower  extremities.  Decreased range of motion.  Gait not assessed Skin: No rashes, lesions or ulcers Psychiatry: Judgement and insight appear normal. Mood & affect appropriate.     Data Reviewed: I have personally reviewed following labs and imaging studies  CBC: Recent Labs  Lab 08/23/23 1521 08/25/23 1213 08/26/23 0418  WBC 12.0* 20.5* 13.0*  NEUTROABS 8.4* 15.1*  --   HGB 10.7* 10.3* 9.1*  HCT 34.8* 33.5* 28.8*  MCV 97.8 99.7 97.6  PLT 378 320 281   Basic Metabolic Panel: Recent Labs  Lab 08/25/23 1213 08/26/23 0418  NA 132* 135  K 4.6 4.7  CL 94* 100  CO2 25 26  GLUCOSE 168* 134*  BUN 50* 40*  CREATININE 1.70* 1.33*  CALCIUM  8.4* 8.3*   GFR: Estimated Creatinine Clearance: 22.3 mL/min (A) (by C-G formula based on SCr of 1.33 mg/dL (H)). Liver Function Tests: No results for input(s): "AST", "ALT", "ALKPHOS", "BILITOT", "PROT", "ALBUMIN" in the last 168 hours. No results for input(s): "LIPASE", "AMYLASE" in the last 168 hours. No results for input(s): "AMMONIA" in the last 168 hours. Coagulation Profile: No results for input(s): "INR", "PROTIME" in the last 168 hours. Cardiac Enzymes: No results for input(s): "CKTOTAL", "CKMB", "CKMBINDEX", "TROPONINI" in the last 168 hours. BNP (last 3 results) No results for input(s): "PROBNP" in the last 8760 hours. HbA1C: No results for input(s): "HGBA1C" in the last 72 hours. CBG: No results for input(s): "GLUCAP" in the last 168 hours. Lipid Profile: No results for input(s): "CHOL", "HDL", "LDLCALC", "TRIG", "CHOLHDL", "LDLDIRECT" in the last 72 hours. Thyroid  Function Tests: No results for input(s): "TSH", "T4TOTAL", "FREET4", "T3FREE", "THYROIDAB" in the last 72 hours. Anemia Panel: No results for input(s): "VITAMINB12", "FOLATE", "FERRITIN", "TIBC", "IRON ", "RETICCTPCT" in the last 72 hours. Sepsis Labs: No results for input(s): "PROCALCITON", "LATICACIDVEN" in the last 168 hours.  No results found for this or any  previous visit (from the past 240 hours).       Radiology Studies: No results found.       Scheduled Meds:  aspirin  EC  81 mg Oral Daily   calcium  carbonate  1 tablet Oral BID WC   cholecalciferol   2,000 Units Oral Daily   docusate sodium   100 mg Oral Daily   dorzolamide   1 drop Both Eyes BID   enoxaparin  (LOVENOX ) injection  30 mg Subcutaneous Q24H   ketorolac   15 mg Intravenous Q6H   latanoprost   1 drop Both Eyes QHS   levothyroxine   100 mcg Oral QAC breakfast   lidocaine   1 patch Transdermal Q24H   magnesium  oxide  400 mg Oral Daily   nortriptyline   25 mg Oral QHS   pantoprazole   40 mg Oral Daily   pravastatin   40 mg Oral q1800   pregabalin   50 mg Oral BID   umeclidinium bromide   1 puff Inhalation Daily   Continuous Infusions:     LOS: 2 days    Tiajuana Fluke, MD [facility]   If 7PM-7AM, please contact night-coverage  08/27/2023, 12:41 PM

## 2023-08-28 ENCOUNTER — Inpatient Hospital Stay: Payer: PPO

## 2023-08-28 ENCOUNTER — Inpatient Hospital Stay: Payer: PPO | Admitting: Radiology

## 2023-08-28 ENCOUNTER — Other Ambulatory Visit: Payer: Self-pay | Admitting: Radiology

## 2023-08-28 DIAGNOSIS — S22060A Wedge compression fracture of T7-T8 vertebra, initial encounter for closed fracture: Secondary | ICD-10-CM | POA: Diagnosis not present

## 2023-08-28 HISTORY — PX: IR KYPHO THORACIC WITH BONE BIOPSY: IMG5518

## 2023-08-28 LAB — CBC
HCT: 28.3 % — ABNORMAL LOW (ref 36.0–46.0)
Hemoglobin: 8.5 g/dL — ABNORMAL LOW (ref 12.0–15.0)
MCH: 30.9 pg (ref 26.0–34.0)
MCHC: 30 g/dL (ref 30.0–36.0)
MCV: 102.9 fL — ABNORMAL HIGH (ref 80.0–100.0)
Platelets: 294 10*3/uL (ref 150–400)
RBC: 2.75 MIL/uL — ABNORMAL LOW (ref 3.87–5.11)
RDW: 13.8 % (ref 11.5–15.5)
WBC: 23.8 10*3/uL — ABNORMAL HIGH (ref 4.0–10.5)
nRBC: 0 % (ref 0.0–0.2)

## 2023-08-28 LAB — PROTIME-INR
INR: 1.3 — ABNORMAL HIGH (ref 0.8–1.2)
Prothrombin Time: 16.1 s — ABNORMAL HIGH (ref 11.4–15.2)

## 2023-08-28 MED ORDER — MIDAZOLAM HCL 2 MG/2ML IJ SOLN
INTRAMUSCULAR | Status: AC | PRN
  Administered 2023-08-28 (×2): .5 mg via INTRAVENOUS

## 2023-08-28 MED ORDER — LIDOCAINE HCL (PF) 1 % IJ SOLN
12.0000 mL | Freq: Once | INTRAMUSCULAR | Status: AC
  Administered 2023-08-28: 12 mL via INTRADERMAL

## 2023-08-28 MED ORDER — MIDAZOLAM HCL 2 MG/2ML IJ SOLN
INTRAMUSCULAR | Status: AC
  Filled 2023-08-28: qty 2

## 2023-08-28 MED ORDER — FENTANYL CITRATE (PF) 100 MCG/2ML IJ SOLN
INTRAMUSCULAR | Status: AC
  Filled 2023-08-28: qty 2

## 2023-08-28 MED ORDER — LIDOCAINE HCL (PF) 1 % IJ SOLN
INTRAMUSCULAR | Status: AC
  Filled 2023-08-28: qty 30

## 2023-08-28 MED ORDER — FENTANYL CITRATE (PF) 100 MCG/2ML IJ SOLN
INTRAMUSCULAR | Status: AC | PRN
  Administered 2023-08-28 (×2): 25 ug via INTRAVENOUS

## 2023-08-28 NOTE — Progress Notes (Signed)
PROGRESS NOTE    Robyn Butler  ZOX:[id number] DOB: 1928/08/27 DOA: 08/25/2023 PCP: Lauro Regulus, MD    Brief Narrative:  89 y.o. female with medical history significant of COPD, HTN, HLD, hypothyroidism, CKD stage IIIa, osteoporosis, presented with worsening of back pain.   Patient lives by herself and at bedside with steady gait.  3 days ago, patient tried deep to corner of her mattress similarly developed a 10/10 sharp like back pain.  Denies any chest pain or abdominal pain or leg pain. But she does have chronic constipation and urinary incontinence.  Went to see PCP yesterday and was given lidocaine patch however pain well-controlled.  MRI of patient was done which showed T7 compression fracture.  Last 2 days, patient's neighbor has been helping her however patient has had significant decrease of activity because of fear of more back pain as result she mainly stays in bed for last 2 days and has had significant decrease of oral intake because of the back pain.  1 week ago patient started develop productive cough with yellowish sputum production and shortness of breath and wheezing came to ED when she was diagnosed with bronchitis and COPD exacerbation and sent home with antibiotics and prednisone tapering doses and inhalers.  She reported significant improvement of shortness of breath and wheezing and the cough has become dry.  No fever or chills.   Assessment & Plan:   Principal Problem:   Thoracic compression fracture (HCC)  T7 vertebral compression fracture Pathologic fracture -Secondary to severe osteoporosis -Neuro exam benign -Patient already on biphosphonate -IR consulted Plan: N.p.o. for kyphoplasty today Pain control Calcium and vitamin D supplementation TLSO brace Therapy evaluations Suspect will need skilled nursing facility  Leukocytosis -Patient came to the hospital 7 days ago for COPD exacerbation and bronchitis and was discharged from ED with Augmentin and  steroids.  Patient reported improvement of breathing symptoms after taking 7 days of above medications.  Clinically patient has no signs of active infection, suspect leukocytosis secondary to steroid use.  Morphology of WBC appear to be within normal limits.   AKI on CKD stage IIIa -Clinically patient appeared to be dehydrated secondary to poor p.o. intake -Improving over interval -Hold IV fluids for now -Monitor creatinine after kyphoplasty   COPD -Incentive spirometry -DuoNebs as needed   Hx of lumbar radiculopathy -Lumbar MRI reviewed showing chronic changes.   Osteoporosis -Continue biphosphate   HTN -Hold off home BP meds -Start as needed hydralazine   DVT prophylaxis: SQ lovenox Code Status: DNR Family Communication:Daughter Liborio Nixon via phone [phone] Disposition Plan: Status is: Inpatient Remains inpatient appropriate because: Intractable pain, thoracic vertebra fracture   Level of care: Med-Surg  Consultants:  None  Procedures:  None  Antimicrobials: None    Subjective: Seen and examined.  Resting in bed.  No visible distress.  N.p.o. for kyphoplasty today.  Objective: Vitals:   08/27/23 0805 08/27/23 1850 08/28/23 0123 08/28/23 0700  BP: (!) 134/35 (!) 127/47 (!) 151/52 (!) 120/36  Pulse: 68 84 92 82  Resp: 16 16 18 16   Temp: 98.9 F (37.2 C) 98 F (36.7 C) 98.3 F (36.8 C) 98 F (36.7 C)  TempSrc:  Oral    SpO2: 93% 93% 93% 94%  Weight:      Height:       No intake or output data in the 24 hours ending 08/28/23 1115  Filed Weights   08/25/23 1121 08/25/23 1127  Weight: 53 kg 52.6 kg  Examination:  General exam: No acute distress Respiratory system: Lungs clear.  Normal breathing.  Room air Cardiovascular system: S1-S2, RRR, no murmurs, no pedal edema Gastrointestinal system: Soft, mild distention, positive bowel Central nervous system: Alert and oriented. No focal neurological deficits. Extremities: Decreased power bilateral  lower extremities.  Decreased range of motion.  Gait not assessed Skin: No rashes, lesions or ulcers Psychiatry: Judgement and insight appear normal. Mood & affect appropriate.     Data Reviewed: I have personally reviewed following labs and imaging studies  CBC: Recent Labs  Lab 08/23/23 1521 08/25/23 1213 08/26/23 0418 08/28/23 0625  WBC 12.0* 20.5* 13.0* 23.8*  NEUTROABS 8.4* 15.1*  --   --   HGB 10.7* 10.3* 9.1* 8.5*  HCT 34.8* 33.5* 28.8* 28.3*  MCV 97.8 99.7 97.6 102.9*  PLT 378 320 281 294   Basic Metabolic Panel: Recent Labs  Lab 08/25/23 1213 08/26/23 0418  NA 132* 135  K 4.6 4.7  CL 94* 100  CO2 25 26  GLUCOSE 168* 134*  BUN 50* 40*  CREATININE 1.70* 1.33*  CALCIUM 8.4* 8.3*   GFR: Estimated Creatinine Clearance: 22.3 mL/min (A) (by C-G formula based on SCr of 1.33 mg/dL (H)). Liver Function Tests: No results for input(s): "AST", "ALT", "ALKPHOS", "BILITOT", "PROT", "ALBUMIN" in the last 168 hours. No results for input(s): "LIPASE", "AMYLASE" in the last 168 hours. No results for input(s): "AMMONIA" in the last 168 hours. Coagulation Profile: Recent Labs  Lab 08/28/23 0625  INR 1.3*   Cardiac Enzymes: No results for input(s): "CKTOTAL", "CKMB", "CKMBINDEX", "TROPONINI" in the last 168 hours. BNP (last 3 results) No results for input(s): "PROBNP" in the last 8760 hours. HbA1C: No results for input(s): "HGBA1C" in the last 72 hours. CBG: No results for input(s): "GLUCAP" in the last 168 hours. Lipid Profile: No results for input(s): "CHOL", "HDL", "LDLCALC", "TRIG", "CHOLHDL", "LDLDIRECT" in the last 72 hours. Thyroid Function Tests: No results for input(s): "TSH", "T4TOTAL", "FREET4", "T3FREE", "THYROIDAB" in the last 72 hours. Anemia Panel: No results for input(s): "VITAMINB12", "FOLATE", "FERRITIN", "TIBC", "IRON", "RETICCTPCT" in the last 72 hours. Sepsis Labs: No results for input(s): "PROCALCITON", "LATICACIDVEN" in the last 168  hours.  No results found for this or any previous visit (from the past 240 hours).       Radiology Studies: No results found.       Scheduled Meds:  aspirin EC  81 mg Oral Daily   calcium carbonate  1 tablet Oral BID WC   cholecalciferol  2,000 Units Oral Daily   docusate sodium  100 mg Oral Daily   dorzolamide  1 drop Both Eyes BID   enoxaparin (LOVENOX) injection  30 mg Subcutaneous Q24H   ketorolac  15 mg Intravenous Q6H   latanoprost  1 drop Both Eyes QHS   levothyroxine  100 mcg Oral QAC breakfast   lidocaine  1 patch Transdermal Q24H   magnesium oxide  400 mg Oral Daily   nortriptyline  25 mg Oral QHS   pantoprazole  40 mg Oral Daily   pravastatin  40 mg Oral q1800   pregabalin  50 mg Oral BID   umeclidinium bromide  1 puff Inhalation Daily   Continuous Infusions:   ceFAZolin (ANCEF) IV        LOS: 3 days    Tresa Moore, MD [facility]   If 7PM-7AM, please contact night-coverage  08/28/2023, 11:15 AM

## 2023-08-28 NOTE — Progress Notes (Signed)
PT Cancellation Note  Patient Details Name: DELIAH STREHLOW MRN: [id number] DOB: Jan 27, 1929   Cancelled Treatment:    Reason Eval/Treat Not Completed: Other (comment). Per chart review pt pending kyphoplasty today, PT to attempt as able.    Olga Coaster PT, DPT 2:07 PM,08/28/23

## 2023-08-28 NOTE — Plan of Care (Signed)
Problem: Education: Goal: Knowledge of General Education information will improve Description: Including pain rating scale, medication(s)/side effects and non-pharmacologic comfort measures Outcome: Progressing   Problem: Activity: Goal: Risk for activity intolerance will decrease Outcome: Progressing

## 2023-08-28 NOTE — Procedures (Signed)
Interventional Radiology Procedure Note  Procedure:   Image guided vertebral augmentation of T7, kyphoplasty technique. Unipedicular left approach.  Complications: None Recommendations:  - Ok to shower tomorrow - Do not submerge for 7 days - Routine wound care - ok to sit up in bed in 1 hour after sedation recovery - ok to advance diet per primary order - ok to restart antiplatelet and any AC tomorrow on schedule - ok to start PT tomorrow as needed  Signed,  Yvone Neu. Loreta Ave, DO

## 2023-08-29 ENCOUNTER — Inpatient Hospital Stay: Payer: PPO

## 2023-08-29 DIAGNOSIS — S22060A Wedge compression fracture of T7-T8 vertebra, initial encounter for closed fracture: Secondary | ICD-10-CM | POA: Diagnosis not present

## 2023-08-29 LAB — CBC WITH DIFFERENTIAL/PLATELET
Abs Immature Granulocytes: 0.09 K/uL — ABNORMAL HIGH (ref 0.00–0.07)
Basophils Absolute: 0 K/uL (ref 0.0–0.1)
Basophils Relative: 0 %
Eosinophils Absolute: 0.1 K/uL (ref 0.0–0.5)
Eosinophils Relative: 1 %
HCT: 29.1 % — ABNORMAL LOW (ref 36.0–46.0)
Hemoglobin: 8.8 g/dL — ABNORMAL LOW (ref 12.0–15.0)
Immature Granulocytes: 1 %
Lymphocytes Relative: 7 %
Lymphs Abs: 1.2 K/uL (ref 0.7–4.0)
MCH: 30.2 pg (ref 26.0–34.0)
MCHC: 30.2 g/dL (ref 30.0–36.0)
MCV: 100 fL (ref 80.0–100.0)
Monocytes Absolute: 2.3 K/uL — ABNORMAL HIGH (ref 0.1–1.0)
Monocytes Relative: 14 %
Neutro Abs: 12.5 K/uL — ABNORMAL HIGH (ref 1.7–7.7)
Neutrophils Relative %: 77 %
Platelets: 295 K/uL (ref 150–400)
RBC: 2.91 MIL/uL — ABNORMAL LOW (ref 3.87–5.11)
RDW: 13.5 % (ref 11.5–15.5)
WBC: 16.2 K/uL — ABNORMAL HIGH (ref 4.0–10.5)
nRBC: 0.1 % (ref 0.0–0.2)

## 2023-08-29 LAB — RESPIRATORY PANEL BY PCR

## 2023-08-29 LAB — BASIC METABOLIC PANEL WITH GFR
Anion gap: 10 (ref 5–15)
BUN: 32 mg/dL — ABNORMAL HIGH (ref 8–23)
CO2: 22 mmol/L (ref 22–32)
Calcium: 8.5 mg/dL — ABNORMAL LOW (ref 8.9–10.3)
Chloride: 103 mmol/L (ref 98–111)
Creatinine, Ser: 1.16 mg/dL — ABNORMAL HIGH (ref 0.44–1.00)
GFR, Estimated: 44 mL/min — ABNORMAL LOW (ref >60)
Glucose, Bld: 98 mg/dL (ref 70–99)
Potassium: 5 mmol/L (ref 3.5–5.1)
Sodium: 135 mmol/L (ref 135–145)

## 2023-08-29 LAB — SARS CORONAVIRUS 2 BY RT PCR: SARS Coronavirus 2 by RT PCR: NEGATIVE

## 2023-08-29 NOTE — NC FL2 (Signed)
[place] MEDICAID FL2 LEVEL OF CARE FORM     IDENTIFICATION  Patient Name: Robyn Butler Birthdate: March 27, 1929 Sex: female Admission Date (Current Location): 08/25/2023  [facility] and [place] Number:  Chiropodist and Address:  [facility], [address], [place], [place] [address]      Provider Number: [id number]  Attending Physician Name and Address:  Kathrynn Running, MD  Relative Name and Phone Number:  Salina April [phone]    Current Level of Care: Hospital Recommended Level of Care: Skilled Nursing Facility Prior Approval Number:    Date Approved/Denied:   PASRR Number: [id number]  Discharge Plan: SNF    Current Diagnoses: Patient Active Problem List   Diagnosis Date Noted   Thoracic compression fracture (HCC) 08/25/2023   Aspiration into airway 02/26/2022   Stage 3a chronic kidney disease (CKD) (HCC) 02/26/2022   Type 2 diabetes mellitus with hyperlipidemia (HCC) 02/26/2022   Acute on chronic respiratory failure with hypoxia (HCC) 02/26/2022   Aspiration pneumonia (HCC)    Pressure injury of skin 08/04/2021   Closed right hip fracture (HCC) 08/03/2021   Diabetes mellitus without complication (HCC)    Periprosthetic fracture around internal prosthetic right hip joint (HCC)    Preoperative clearance    Anemia of chronic renal failure, stage 3 (moderate) (HCC) 09/26/2019   Osteoarthritis of hip 10/03/2016   Esophageal stricture 06/27/2016   History of adenomatous polyp of colon 06/16/2015   Dizziness 04/28/2015   Allergic rhinitis 01/27/2015   Absolute anemia 01/27/2015   Cataract 01/27/2015   Anemia due to stage 3a chronic kidney disease (HCC) 01/27/2015   Colon, diverticulosis 01/27/2015   Accumulation of fluid in tissues 01/27/2015   Esophageal reflux 01/27/2015   Glaucoma 01/27/2015   Hemorrhoid 01/27/2015   High potassium 01/27/2015   Hypomagnesemia 01/27/2015   Embedded toenail 01/27/2015   Microalbuminuria  01/27/2015   Neuropathy 01/27/2015   Neuralgia neuritis, sciatic nerve 01/27/2015   Skin lesion 01/27/2015   Cardiac enlargement 02/16/2014   OP (osteoporosis) 08/01/2012   Personal history of traumatic fracture 08/02/2011   Arthropathy of pelvic region and thigh 07/07/2009   Leg varices 01/02/2006   Diverticulitis of colon 01/20/2004   Barrett esophagus 10/26/2003   COPD (chronic obstructive pulmonary disease) (HCC) 04/16/2003   Aortic valve disorder 04/16/2003   Adult hypothyroidism 07/30/2002   Female genuine stress incontinence 02/07/2002   Asthma 08/02/2001   Diabetic neuropathy (HCC) 08/27/2000   Essential (primary) hypertension 08/27/2000   H/O malignant neoplasm of breast 07/17/1997   History of tobacco use 07/17/1988   H/O total hysterectomy 07/17/1970    Orientation RESPIRATION BLADDER Height & Weight     Self, Time, Situation, Place  Normal Continent Weight: 52.6 kg Height:  5\' 3"  (160 cm)  BEHAVIORAL SYMPTOMS/MOOD NEUROLOGICAL BOWEL NUTRITION STATUS      Continent  (See Discharge Summary)  AMBULATORY STATUS COMMUNICATION OF NEEDS Skin   Extensive Assist Verbally Normal                       Personal Care Assistance Level of Assistance  Bathing, Feeding, Dressing Bathing Assistance: Maximum assistance Feeding assistance: Limited assistance Dressing Assistance: Maximum assistance     Functional Limitations Info  Sight, Hearing, Speech Sight Info: Adequate Hearing Info: Adequate Speech Info: Adequate    SPECIAL CARE FACTORS FREQUENCY  PT (By licensed PT), OT (By licensed OT)     PT Frequency: 5X weekly OT Frequency: 5x weekly  Contractures Contractures Info: Not present    Additional Factors Info  Code Status, Allergies Code Status Info: DNR-Limited Allergies Info: Alendronate           Current Medications (08/29/2023):  This is the current hospital active medication list Current Facility-Administered Medications   Medication Dose Route Frequency Provider Last Rate Last Admin   acetaminophen (TYLENOL) tablet 650 mg  650 mg Oral Q6H PRN Emeline General, MD   650 mg at 08/26/23 1610   Or   acetaminophen (TYLENOL) suppository 650 mg  650 mg Rectal Q6H PRN Mikey College T, MD       albuterol (PROVENTIL) (2.5 MG/3ML) 0.083% nebulizer solution 2.5 mg  2.5 mg Inhalation Q6H PRN Emeline General, MD       aspirin EC tablet 81 mg  81 mg Oral Daily Mikey College T, MD   81 mg at 08/28/23 9604   benzonatate (TESSALON) capsule 100 mg  100 mg Oral TID PRN Mikey College T, MD   100 mg at 08/26/23 1746   calcium carbonate (OS-CAL - dosed in mg of elemental calcium) tablet 1,250 mg  1 tablet Oral BID WC Mikey College T, MD   1,250 mg at 08/28/23 1739   cholecalciferol (VITAMIN D3) 25 MCG (1000 UNIT) tablet 2,000 Units  2,000 Units Oral Daily Mikey College T, MD   2,000 Units at 08/28/23 5409   cyclobenzaprine (FLEXERIL) tablet 5 mg  5 mg Oral TID PRN Emeline General, MD       docusate sodium (COLACE) capsule 100 mg  100 mg Oral Daily Mikey College T, MD   100 mg at 08/28/23 0927   dorzolamide (TRUSOPT) 2 % ophthalmic solution 1 drop  1 drop Both Eyes BID Mikey College T, MD   1 drop at 08/28/23 2101   enoxaparin (LOVENOX) injection 30 mg  30 mg Subcutaneous Q24H Foye Deer, RPH   30 mg at 08/28/23 2101   guaiFENesin (ROBITUSSIN) 100 MG/5ML liquid 5 mL  5 mL Oral Q6H PRN Mikey College T, MD   5 mL at 08/26/23 1745   hydrALAZINE (APRESOLINE) tablet 10 mg  10 mg Oral Q6H PRN Mikey College T, MD       HYDROmorphone (DILAUDID) injection 0.5 mg  0.5 mg Intravenous Q4H PRN Mikey College T, MD       lactulose (CHRONULAC) 10 GM/15ML solution 10 g  10 g Oral BID PRN Mikey College T, MD   10 g at 08/27/23 1003   latanoprost (XALATAN) 0.005 % ophthalmic solution 1 drop  1 drop Both Eyes QHS Mikey College T, MD   1 drop at 08/28/23 2101   levothyroxine (SYNTHROID) tablet 100 mcg  100 mcg Oral QAC breakfast Mikey College T, MD   100 mcg at 08/29/23 0545    lidocaine (LIDODERM) 5 % 1 patch  1 patch Transdermal Q24H Sharman Cheek, MD   1 patch at 08/27/23 1234   magnesium oxide (MAG-OX) tablet 400 mg  400 mg Oral Daily Mikey College T, MD   400 mg at 08/28/23 8119   nortriptyline (PAMELOR) capsule 25 mg  25 mg Oral QHS Mikey College T, MD   25 mg at 08/28/23 2100   ondansetron (ZOFRAN) tablet 4 mg  4 mg Oral Q6H PRN Emeline General, MD       Or   ondansetron [facility]) injection 4 mg  4 mg Intravenous Q6H PRN Emeline General, MD       oxyCODONE (Oxy IR/ROXICODONE) immediate  release tablet 5 mg  5 mg Oral Q4H PRN Mikey College T, MD       pantoprazole (PROTONIX) EC tablet 40 mg  40 mg Oral Daily Mikey College T, MD   40 mg at 08/28/23 0928   polyethylene glycol (MIRALAX / GLYCOLAX) packet 17 g  17 g Oral Daily PRN Mikey College T, MD       pravastatin (PRAVACHOL) tablet 40 mg  40 mg Oral q1800 Mikey College T, MD   40 mg at 08/28/23 1739   pregabalin (LYRICA) capsule 50 mg  50 mg Oral BID Mikey College T, MD   50 mg at 08/28/23 2100   umeclidinium bromide (INCRUSE ELLIPTA) 62.5 MCG/ACT 1 puff  1 puff Inhalation Daily Emeline General, MD   1 puff at 08/28/23 1059     Discharge Medications: Please see discharge summary for a list of discharge medications.  Relevant Imaging Results:  Relevant Lab Results:   Additional Information [id number]  Garret Reddish, RN

## 2023-08-29 NOTE — Progress Notes (Signed)
Brief Interventional Radiology Note:   89 year-old female with a history of osteoporosis who presented to [facility] ED on 08/25/23 with complaints of worsening back pain after feeling a sharp pain in her back while putting new sheets on the bed. She tried a lidocaine patch per her PCP with minimal relief. Found to have a T7 compression fracture. She underwent a vertebral augmentation of T7 using kyphoplasty technique with Dr. Mosie Epstein on 08/28/23. Today she is reporting improvement in her pain and says 'it's like new.' She has been able to get up and out of bed with assistance multiple times and reports overall improvement in her back pain. Please reach out to IR for any further questions or concerns.   Electronically Signed: Jama Flavors, PA-C 08/29/2023, 4:11 PM

## 2023-08-29 NOTE — Plan of Care (Signed)
Problem: Clinical Measurements: Goal: Will remain free from infection Outcome: Progressing Goal: Diagnostic test results will improve Outcome: Progressing   Problem: Nutrition: Goal: Adequate nutrition will be maintained Outcome: Progressing

## 2023-08-29 NOTE — Progress Notes (Addendum)
Occupational Therapy Re-evaluation Patient Details Name: Robyn Butler MRN: [id number] DOB: 1929/01/13 Today's Date: 08/29/2023   History of present illness Robyn Butler is a 89yoF who comes to [facility] on 08/25/23 with worsening back pain x 3 days.  patient has had significant decrease of activity because of fear of more back pain as result she mainly stays in bed for last 2 days and has had significant decrease of oral intake because of the back pain.  1 week ago patient started develop productive cough with yellowish sputum production and shortness of breath and wheezing  PMH: COPD, HTN, HLD, hypoTSH, CKD3a, osteoporosis. Workup revealing on T7 compression fracture, TLSO ordered and at bedside. Pt is now T7 kyphoplasty 08/29/23   OT comments  Chart reviewed to date, pt greeted in chair, had taken her [place] off and spo2 was 89% on RA. Pt reports she removed it. Donned 3L via [place] and pt >90% throughout. TLSO donned during mobility with pt amb approx 10' with RW with MIN A. Pt reports she feels weak, BP taken and appears WNL. SET UP required for grooming/feeding tasks, MAX A for LB dressing. Pt is making progress towards goals, discharge recommendation remains appropriate. OT will continue to follow.       If plan is discharge home, recommend the following:  A little help with walking and/or transfers;A lot of help with bathing/dressing/bathroom;Help with stairs or ramp for entrance;Assistance with cooking/housework   Equipment Recommendations  Other (comment) (defer)    Recommendations for Other Services      Precautions / Restrictions Precautions Precautions: Fall;Back Required Braces or Orthoses: Spinal Brace Spinal Brace: Thoracolumbosacral orthotic;Other (comment) Spinal Brace Comments: off while in chair Restrictions Weight Bearing Restrictions Per Provider Order: No       Mobility Bed Mobility               General bed mobility comments: NT in chair pre/post session     Transfers Overall transfer level: Needs assistance Equipment used: Rolling walker (2 wheels) Transfers: Sit to/from Stand Sit to Stand: Min assist                 Balance Overall balance assessment: Needs assistance Sitting-balance support: Feet supported Sitting balance-Leahy Scale: Good     Standing balance support: Bilateral upper extremity supported, During functional activity, Reliant on assistive device for balance Standing balance-Leahy Scale: Fair                             ADL either performed or assessed with clinical judgement   ADL Overall ADL's : Needs assistance/impaired Eating/Feeding: Set up;Sitting   Grooming: Set up;Sitting           Upper Body Dressing : Maximal assistance Upper Body Dressing Details (indicate cue type and reason): TLSO Lower Body Dressing: Maximal assistance Lower Body Dressing Details (indicate cue type and reason): socks             Functional mobility during ADLs: Minimal assistance;Rolling walker (2 wheels) (approx 10' in room with RW)      Extremity/Trunk Assessment Upper Extremity Assessment Upper Extremity Assessment: Generalized weakness   Lower Extremity Assessment Lower Extremity Assessment: Generalized weakness   Cervical / Trunk Assessment Cervical / Trunk Assessment: Normal    Vision Patient Visual Report: No change from baseline     Perception     Praxis     Communication Communication Communication: Impaired Factors Affecting Communication: Hearing impaired  Cognition Arousal: Alert Behavior During Therapy: WFL for tasks assessed/performed Cognition: No family/caregiver present to determine baseline                               Following commands: Intact        Cueing   Cueing Techniques: Verbal cues, Tactile cues, Visual cues  Exercises Other Exercises Other Exercises: edu re: role of OT, role of rehab, discharge recommendations    Shoulder  Instructions       General Comments      Pertinent Vitals/ Pain       Pain Assessment Pain Assessment: Faces Faces Pain Scale: Hurts a little bit Pain Location: LBP Pain Descriptors / Indicators: Aching, Grimacing, Guarding Pain Intervention(s): Monitored during session, Limited activity within patient's tolerance, Repositioned  Home Living                                          Prior Functioning/Environment              Frequency  Min 1X/week        Progress Toward Goals  OT Goals(current goals can now be found in the care plan section)  Progress towards OT goals: Progressing toward goals  Acute Rehab OT Goals Time For Goal Achievement: 09/10/23  Plan      Co-evaluation                 AM-PAC OT "6 Clicks" Daily Activity     Outcome Measure   Help from another person eating meals?: None Help from another person taking care of personal grooming?: None Help from another person toileting, which includes using toliet, bedpan, or urinal?: A Little Help from another person bathing (including washing, rinsing, drying)?: A Lot Help from another person to put on and taking off regular upper body clothing?: A Lot Help from another person to put on and taking off regular lower body clothing?: A Lot 6 Click Score: 17    End of Session Equipment Utilized During Treatment: Rolling walker (2 wheels);Oxygen  OT Visit Diagnosis: Other abnormalities of gait and mobility (R26.89);Muscle weakness (generalized) (M62.81);Unsteadiness on feet (R26.81)   Activity Tolerance Patient tolerated treatment well   Patient Left in chair;with call bell/phone within reach;with chair alarm set   Nurse Communication Mobility status (vitals)        Time: 0940-1006 OT Time Calculation (min): 26 min  Charges: OT General Charges $OT Visit: 1 Visit OT Evaluation $OT Re-eval: 1 Re-eval OT Treatments $Self Care/Home Management : 8-22 mins $Therapeutic  Activity: 8-22 mins  Oleta Mouse, OTD OTR/L  08/29/23, 11:24 AM

## 2023-08-29 NOTE — Evaluation (Signed)
Physical Therapy Evaluation Patient Details Name: Robyn Butler MRN: [id number] DOB: 18-Jan-1929 Today's Date: 08/29/2023  History of Present Illness  Shron Roache is a 89yoF who comes to [facility] on 08/25/23 with worsening back pain x 3 days.  patient has had significant decrease of activity because of fear of more back pain as result she mainly stays in bed for last 2 days and has had significant decrease of oral intake because of the back pain.  1 week ago patient started develop productive cough with yellowish sputum production and shortness of breath and wheezing  PMH: COPD, HTN, HLD, hypoTSH, CKD3a, osteoporosis. Workup revealing on T7 compression fracture, s/p kyphoplasty 08/28/23.   Clinical Impression  Pt alert, seated in recliner upon PT arrival. Stated she dropped her fork and wasn't able to finish breakfast. Exhibited mild pain signs/symptoms with mobility in regards to her LBP. She was able to sit <> Stand from recliner with RW, CGA. Attempted to ambulate but the pt was unable to ambulate >17ft due to increasing fatigue and pt reported BLE weakness. Pt sat EOB and needed minA due to posterior lean. spO2 89-90% on [place]. MinA for BLE assist to return to supine.  Overall the patient demonstrated deficits (see "PT Problem List") that impede the patient's functional abilities, safety, and mobility and would benefit from skilled PT intervention.          If plan is discharge home, recommend the following: A lot of help with bathing/dressing/bathroom;Help with stairs or ramp for entrance;Assist for transportation;Assistance with cooking/housework;Direct supervision/assist for financial management;A lot of help with walking and/or transfers   Can travel by private vehicle   No    Equipment Recommendations None recommended by PT  Recommendations for Other Services       Functional Status Assessment Patient has had a recent decline in their functional status and demonstrates the ability to make  significant improvements in function in a reasonable and predictable amount of time.     Precautions / Restrictions Precautions Precautions: Fall;Back Precaution Booklet Issued: No Spinal Brace: Thoracolumbosacral orthotic;Other (comment) Spinal Brace Comments: wear for ambulation only, doff while sitting      Mobility  Bed Mobility Overal bed mobility: Needs Assistance Bed Mobility: Rolling, Sit to Sidelying Rolling: Contact guard assist       Sit to sidelying: Min assist General bed mobility comments: minA for BLE assist    Transfers Overall transfer level: Needs assistance Equipment used: Rolling walker (2 wheels) Transfers: Sit to/from Stand Sit to Stand: Contact guard assist   Step pivot transfers: Contact guard assist       General transfer comment: required BUE support    Ambulation/Gait Ambulation/Gait assistance: Contact guard assist Gait Distance (Feet): 7 Feet Assistive device: Rolling walker (2 wheels)         General Gait Details: pt unable to continue ambulation due to fatigue. noted for eyes closed and needed minA in sitting. spO2 90% on [place]  Stairs            Wheelchair Mobility     Tilt Bed    Modified Rankin (Stroke Patients Only)       Balance Overall balance assessment: Needs assistance Sitting-balance support: Feet supported Sitting balance-Leahy Scale: Poor Sitting balance - Comments: with fatigue, needed minA to avoid posterior LOB   Standing balance support: Bilateral upper extremity supported, During functional activity, Reliant on assistive device for balance Standing balance-Leahy Scale: Fair  Pertinent Vitals/Pain Pain Assessment Pain Assessment: Faces Faces Pain Scale: Hurts a little bit Pain Location: LBP Pain Descriptors / Indicators: Grimacing, Guarding Pain Intervention(s): Limited activity within patient's tolerance, Monitored during session, Repositioned     Home Living Family/patient expects to be discharged to:: Private residence Living Arrangements: Alone Available Help at Discharge: Family;Other (Comment);Available PRN/intermittently;Friend(s) (daughter/friends check in) Type of Home: Apartment Home Access: Level entry       Home Layout: One level Home Equipment: Agricultural consultant (2 wheels);Rollator (4 wheels);Cane - single point;Shower seat      Prior Function Prior Level of Function : Independent/Modified Independent;Needs assist             Mobility Comments: amb with no AD household distances, PRN use of rollator ADLs Comments: MOD I in ADL, prn assist for IADL     Extremity/Trunk Assessment   Upper Extremity Assessment Upper Extremity Assessment: Generalized weakness    Lower Extremity Assessment Lower Extremity Assessment: Generalized weakness    Cervical / Trunk Assessment Cervical / Trunk Assessment: Back Surgery  Communication   Communication Communication: Impaired Factors Affecting Communication: Hearing impaired    Cognition Arousal: Alert Behavior During Therapy: WFL for tasks assessed/performed   PT - Cognitive impairments: No apparent impairments                         Following commands: Intact       Cueing       General Comments      Exercises     Assessment/Plan    PT Assessment Patient needs continued PT services  PT Problem List Decreased strength;Decreased range of motion;Decreased activity tolerance;Decreased balance;Decreased mobility;Decreased coordination;Decreased knowledge of use of DME       PT Treatment Interventions DME instruction;Gait training;Patient/family education;Stair training;Functional mobility training;Therapeutic activities;Therapeutic exercise;Balance training;Neuromuscular re-education    PT Goals (Current goals can be found in the Care Plan section)  Acute Rehab PT Goals Patient Stated Goal: regain independence and mobility PT Goal  Formulation: With patient Time For Goal Achievement: 09/12/23 Potential to Achieve Goals: Good    Frequency 7X/week     Co-evaluation               AM-PAC PT "6 Clicks" Mobility  Outcome Measure Help needed turning from your back to your side while in a flat bed without using bedrails?: A Lot Help needed moving from lying on your back to sitting on the side of a flat bed without using bedrails?: A Lot Help needed moving to and from a bed to a chair (including a wheelchair)?: A Lot Help needed standing up from a chair using your arms (e.g., wheelchair or bedside chair)?: A Lot Help needed to walk in hospital room?: A Lot Help needed climbing 3-5 steps with a railing? : A Lot 6 Click Score: 12    End of Session Equipment Utilized During Treatment: Oxygen Activity Tolerance: Patient limited by fatigue Patient left: in bed;with call bell/phone within reach;with bed alarm set Nurse Communication: Mobility status PT Visit Diagnosis: Unsteadiness on feet (R26.81);Difficulty in walking, not elsewhere classified (R26.2);Other abnormalities of gait and mobility (R26.89);Repeated falls (R29.6);Muscle weakness (generalized) (M62.81);History of falling (Z91.81)    Time: 4098-1191 PT Time Calculation (min) (ACUTE ONLY): 12 min   Charges:   PT Evaluation $PT Re-evaluation: 1 Re-eval PT Treatments $Therapeutic Activity: 8-22 mins PT General Charges $$ ACUTE PT VISIT: 1 Visit         Olga Coaster PT, DPT 11:45  AM,08/29/23

## 2023-08-29 NOTE — Progress Notes (Signed)
PROGRESS NOTE    Robyn Butler  ZOX:[id number] DOB: 05-12-1929 DOA: 08/25/2023 PCP: Lauro Regulus, MD    Brief Narrative:  89 y.o. female with medical history significant of COPD, HTN, HLD, hypothyroidism, CKD stage IIIa, osteoporosis, presented with worsening of back pain.   Patient lives by herself and at bedside with steady gait.  3 days ago, patient tried deep to corner of her mattress similarly developed a 10/10 sharp like back pain.  Denies any chest pain or abdominal pain or leg pain. But she does have chronic constipation and urinary incontinence.  Went to see PCP yesterday and was given lidocaine patch however pain well-controlled.  MRI of patient was done which showed T7 compression fracture.  Last 2 days, patient's neighbor has been helping her however patient has had significant decrease of activity because of fear of more back pain as result she mainly stays in bed for last 2 days and has had significant decrease of oral intake because of the back pain.  1 week ago patient started develop productive cough with yellowish sputum production and shortness of breath and wheezing came to ED when she was diagnosed with bronchitis and COPD exacerbation and sent home with antibiotics and prednisone tapering doses and inhalers.  She reported significant improvement of shortness of breath and wheezing and the cough has become dry.  No fever or chills.   Assessment & Plan:   Principal Problem:   Thoracic compression fracture (HCC)  T7 vertebral compression fracture Pathologic fracture -Secondary to severe osteoporosis -Neuro exam benign -Patient already on biphosphonate -IR consulted, kyphoplasty performed 2/11 Plan: Pain control Calcium and vitamin D supplementation TLSO brace TOC working on SNF placement   AKI on CKD stage IIIa -Clinically patient appeared to be dehydrated secondary to poor p.o. intake -Improving over interval -Hold IV fluids for now but k has trended up may  need a small boluw tomorrow, will push fluids today   COPD Hypoxia Recently treated for copd exacerbation, has cough. On 3 liters, o2 today off oxygen mid-80s - will check cxr, covid, rvp   Hx of lumbar radiculopathy -Lumbar MRI reviewed showing chronic changes.   Osteoporosis -Continue biphosphate outpt   HTN Bp elevated this morning but that is an outlier - resume home meds if remains elevated   DVT prophylaxis: SQ lovenox Code Status: DNR Family Communication: Daughter Liborio Nixon telephonically 2/12 Disposition Plan: Status is: Inpatient Remains inpatient appropriate because: pending snf placement   Level of care: Med-Surg  Consultants:  None  Procedures:  None  Antimicrobials: None    Subjective: Seen and examined. Pain is morderate, tolerating diet, bm yesterday  Objective: Vitals:   08/28/23 1515 08/28/23 1543 08/28/23 2327 08/29/23 0907  BP: (!) 125/40 (!) 102/44 (!) 116/45 (!) 160/48  Pulse: 81 79 91 (!) 102  Resp: 14 16 16 16   Temp:  98.7 F (37.1 C) 98.7 F (37.1 C) (!) 97.4 F (36.3 C)  TempSrc:  Oral    SpO2: 97% 95% 96% (!) 85%  Weight:      Height:       No intake or output data in the 24 hours ending 08/29/23 1021  Filed Weights   08/25/23 1121 08/25/23 1127  Weight: 53 kg 52.6 kg    Examination:  General exam: No acute distress Respiratory system: rales at bases, otherwise clear Cardiovascular system: S1-S2, RRR, no murmurs, no pedal edema Gastrointestinal system: Soft, mild distention, positive bowel Central nervous system: Alert and oriented. No focal neurological  deficits. Extremities: warm, sensation intact. Skin: No rashes, lesions or ulcers Psychiatry: mild confusion, calm    Data Reviewed: I have personally reviewed following labs and imaging studies  CBC: Recent Labs  Lab 08/23/23 1521 08/25/23 1213 08/26/23 0418 08/28/23 0625 08/29/23 0510  WBC 12.0* 20.5* 13.0* 23.8* 16.2*  NEUTROABS 8.4* 15.1*  --   --  12.5*   HGB 10.7* 10.3* 9.1* 8.5* 8.8*  HCT 34.8* 33.5* 28.8* 28.3* 29.1*  MCV 97.8 99.7 97.6 102.9* 100.0  PLT 378 320 281 294 295   Basic Metabolic Panel: Recent Labs  Lab 08/25/23 1213 08/26/23 0418 08/29/23 0510  NA 132* 135 135  K 4.6 4.7 5.0  CL 94* 100 103  CO2 25 26 22   GLUCOSE 168* 134* 98  BUN 50* 40* 32*  CREATININE 1.70* 1.33* 1.16*  CALCIUM 8.4* 8.3* 8.5*   GFR: Estimated Creatinine Clearance: 25.6 mL/min (A) (by C-G formula based on SCr of 1.16 mg/dL (H)). Liver Function Tests: No results for input(s): "AST", "ALT", "ALKPHOS", "BILITOT", "PROT", "ALBUMIN" in the last 168 hours. No results for input(s): "LIPASE", "AMYLASE" in the last 168 hours. No results for input(s): "AMMONIA" in the last 168 hours. Coagulation Profile: Recent Labs  Lab 08/28/23 0625  INR 1.3*   Cardiac Enzymes: No results for input(s): "CKTOTAL", "CKMB", "CKMBINDEX", "TROPONINI" in the last 168 hours. BNP (last 3 results) No results for input(s): "PROBNP" in the last 8760 hours. HbA1C: No results for input(s): "HGBA1C" in the last 72 hours. CBG: No results for input(s): "GLUCAP" in the last 168 hours. Lipid Profile: No results for input(s): "CHOL", "HDL", "LDLCALC", "TRIG", "CHOLHDL", "LDLDIRECT" in the last 72 hours. Thyroid Function Tests: No results for input(s): "TSH", "T4TOTAL", "FREET4", "T3FREE", "THYROIDAB" in the last 72 hours. Anemia Panel: No results for input(s): "VITAMINB12", "FOLATE", "FERRITIN", "TIBC", "IRON", "RETICCTPCT" in the last 72 hours. Sepsis Labs: No results for input(s): "PROCALCITON", "LATICACIDVEN" in the last 168 hours.  No results found for this or any previous visit (from the past 240 hours).       Radiology Studies: US Abdomen Complete Result Date: 08/28/2023 CLINICAL DATA:  Abdominal distension EXAM: ABDOMEN ULTRASOUND COMPLETE COMPARISON:  None Available. FINDINGS: Gallbladder: Prior cholecystectomy. Common bile duct: Diameter: 3 mm Liver: No focal  lesion identified. Within normal limits in parenchymal echogenicity. Portal vein is patent on color Doppler imaging with normal direction of blood flow towards the liver. IVC: No abnormality visualized. Pancreas: Visualized portion unremarkable. Spleen: Size and appearance within normal limits. Right Kidney: Length: 9.9 cm. Echogenicity within normal limits. No mass or hydronephrosis visualized. Left Kidney: Length: 9.1 cm. Echogenicity within normal limits. No mass or hydronephrosis visualized. Abdominal aorta: No aneurysm visualized. Other findings: No free fluid. IMPRESSION: 1. Unremarkable abdominal ultrasound in a patient status post cholecystectomy. Electronically Signed   By: Sharlet Salina M.D.   On: 08/28/2023 15:11         Scheduled Meds:  aspirin EC  81 mg Oral Daily   calcium carbonate  1 tablet Oral BID WC   cholecalciferol  2,000 Units Oral Daily   docusate sodium  100 mg Oral Daily   dorzolamide  1 drop Both Eyes BID   enoxaparin (LOVENOX) injection  30 mg Subcutaneous Q24H   latanoprost  1 drop Both Eyes QHS   levothyroxine  100 mcg Oral QAC breakfast   lidocaine  1 patch Transdermal Q24H   magnesium oxide  400 mg Oral Daily   nortriptyline  25 mg Oral QHS  pantoprazole  40 mg Oral Daily   pravastatin  40 mg Oral q1800   pregabalin  50 mg Oral BID   umeclidinium bromide  1 puff Inhalation Daily   Continuous Infusions:      LOS: 4 days    Silvano Bilis, MD [facility]   If 7PM-7AM, please contact night-coverage  08/29/2023, 10:21 AM

## 2023-08-30 DIAGNOSIS — S22060A Wedge compression fracture of T7-T8 vertebra, initial encounter for closed fracture: Secondary | ICD-10-CM | POA: Diagnosis not present

## 2023-08-30 LAB — BASIC METABOLIC PANEL
Anion gap: 8 (ref 5–15)
BUN: 33 mg/dL — ABNORMAL HIGH (ref 8–23)
CO2: 24 mmol/L (ref 22–32)
Calcium: 8.9 mg/dL (ref 8.9–10.3)
Chloride: 104 mmol/L (ref 98–111)
Creatinine, Ser: 1.17 mg/dL — ABNORMAL HIGH (ref 0.44–1.00)
GFR, Estimated: 44 mL/min — ABNORMAL LOW (ref >60)
Glucose, Bld: 142 mg/dL — ABNORMAL HIGH (ref 70–99)
Potassium: 4.5 mmol/L (ref 3.5–5.1)
Sodium: 136 mmol/L (ref 135–145)

## 2023-08-30 LAB — PATHOLOGIST SMEAR REVIEW

## 2023-08-30 MED ORDER — OXYCODONE HCL 5 MG PO TABS
2.5000 mg | ORAL_TABLET | ORAL | Status: DC | PRN
  Administered 2023-08-31: 5 mg via ORAL
  Filled 2023-08-30: qty 1

## 2023-08-30 MED ORDER — POLYETHYLENE GLYCOL 3350 17 G PO PACK
17.0000 g | PACK | Freq: Every day | ORAL | Status: DC
  Administered 2023-08-30 – 2023-08-31 (×2): 17 g via ORAL
  Filled 2023-08-30 (×2): qty 1

## 2023-08-30 NOTE — Progress Notes (Signed)
PROGRESS NOTE    Robyn Butler  ZOX:[id number] DOB: 23-Jan-1929 DOA: 08/25/2023 PCP: Lauro Regulus, MD    Brief Narrative:  89 y.o. female with medical history significant of COPD, HTN, HLD, hypothyroidism, CKD stage IIIa, osteoporosis, presented with worsening of back pain.   Patient lives by herself and at bedside with steady gait.  3 days ago, patient tried deep to corner of her mattress similarly developed a 10/10 sharp like back pain.  Denies any chest pain or abdominal pain or leg pain. But she does have chronic constipation and urinary incontinence.  Went to see PCP yesterday and was given lidocaine patch however pain well-controlled.  MRI of patient was done which showed T7 compression fracture.  Last 2 days, patient's neighbor has been helping her however patient has had significant decrease of activity because of fear of more back pain as result she mainly stays in bed for last 2 days and has had significant decrease of oral intake because of the back pain.  1 week ago patient started develop productive cough with yellowish sputum production and shortness of breath and wheezing came to ED when she was diagnosed with bronchitis and COPD exacerbation and sent home with antibiotics and prednisone tapering doses and inhalers.  She reported significant improvement of shortness of breath and wheezing and the cough has become dry.  No fever or chills.   Assessment & Plan:   Principal Problem:   Thoracic compression fracture (HCC)  T7 vertebral compression fracture Pathologic fracture -Secondary to severe osteoporosis -Neuro exam benign -Patient already on biphosphonate -IR consulted, kyphoplasty performed 2/11 Plan: Pain control Calcium and vitamin D supplementation TLSO brace TOC working on SNF placement   AKI on CKD stage IIIa Aki resolved, kidney function stable   COPD Hypoxia Atelectasis Recently treated for copd exacerbation, has cough. On 2 liters, cxr with  atelectasis, covid and rvp neg - IS   Hx of lumbar radiculopathy -Lumbar MRI reviewed showing chronic changes.   Osteoporosis -Continue biphosphate outpt   HTN Bp labile, borderline low today - home meds on hold   DVT prophylaxis: SQ lovenox Code Status: DNR Family Communication: Daughter Liborio Nixon telephonically 2/13 Disposition Plan: Status is: Inpatient Remains inpatient appropriate because: pending snf placement   Level of care: Med-Surg  Consultants:  None  Procedures:  None  Antimicrobials: None    Subjective: Seen and examined. Pain is morderate, tolerating diet, bm yesterday  Objective: Vitals:   08/29/23 0908 08/29/23 1657 08/29/23 2042 08/30/23 0800  BP:  (!) 108/39 (!) 133/53 (!) 100/43  Pulse:  72 79 70  Resp:  16 20 16   Temp:  97.6 F (36.4 C) 98.7 F (37.1 C)   TempSrc:      SpO2: 93% 99% 99% 99%  Weight:      Height:        Intake/Output Summary (Last 24 hours) at 08/30/2023 1154 Last data filed at 08/29/2023 1700 Gross per 24 hour  Intake 480 ml  Output 400 ml  Net 80 ml    Filed Weights   08/25/23 1121 08/25/23 1127  Weight: 53 kg 52.6 kg    Examination:  General exam: No acute distress Respiratory system: rales at bases, otherwise clear Cardiovascular system: S1-S2, RRR, no murmurs, no pedal edema Gastrointestinal system: Soft, mild distention, positive bowel Central nervous system: Alert and oriented. No focal neurological deficits. Extremities: warm, sensation intact. Skin: No rashes, lesions or ulcers Psychiatry: mild confusion, calm    Data Reviewed: I have  personally reviewed following labs and imaging studies  CBC: Recent Labs  Lab 08/23/23 1521 08/25/23 1213 08/26/23 0418 08/28/23 0625 08/29/23 0510  WBC 12.0* 20.5* 13.0* 23.8* 16.2*  NEUTROABS 8.4* 15.1*  --   --  12.5*  HGB 10.7* 10.3* 9.1* 8.5* 8.8*  HCT 34.8* 33.5* 28.8* 28.3* 29.1*  MCV 97.8 99.7 97.6 102.9* 100.0  PLT 378 320 281 294 295   Basic  Metabolic Panel: Recent Labs  Lab 08/25/23 1213 08/26/23 0418 08/29/23 0510 08/30/23 0521  NA 132* 135 135 136  K 4.6 4.7 5.0 4.5  CL 94* 100 103 104  CO2 25 26 22 24   GLUCOSE 168* 134* 98 142*  BUN 50* 40* 32* 33*  CREATININE 1.70* 1.33* 1.16* 1.17*  CALCIUM 8.4* 8.3* 8.5* 8.9   GFR: Estimated Creatinine Clearance: 25.4 mL/min (A) (by C-G formula based on SCr of 1.17 mg/dL (H)). Liver Function Tests: No results for input(s): "AST", "ALT", "ALKPHOS", "BILITOT", "PROT", "ALBUMIN" in the last 168 hours. No results for input(s): "LIPASE", "AMYLASE" in the last 168 hours. No results for input(s): "AMMONIA" in the last 168 hours. Coagulation Profile: Recent Labs  Lab 08/28/23 0625  INR 1.3*   Cardiac Enzymes: No results for input(s): "CKTOTAL", "CKMB", "CKMBINDEX", "TROPONINI" in the last 168 hours. BNP (last 3 results) No results for input(s): "PROBNP" in the last 8760 hours. HbA1C: No results for input(s): "HGBA1C" in the last 72 hours. CBG: No results for input(s): "GLUCAP" in the last 168 hours. Lipid Profile: No results for input(s): "CHOL", "HDL", "LDLCALC", "TRIG", "CHOLHDL", "LDLDIRECT" in the last 72 hours. Thyroid Function Tests: No results for input(s): "TSH", "T4TOTAL", "FREET4", "T3FREE", "THYROIDAB" in the last 72 hours. Anemia Panel: No results for input(s): "VITAMINB12", "FOLATE", "FERRITIN", "TIBC", "IRON", "RETICCTPCT" in the last 72 hours. Sepsis Labs: No results for input(s): "PROCALCITON", "LATICACIDVEN" in the last 168 hours.  Recent Results (from the past 240 hours)  SARS Coronavirus 2 by RT PCR (hospital order, performed in [facility] hospital lab) *cepheid single result test* Anterior Nasal Swab     Status: None   Collection Time: 08/29/23 10:30 AM   Specimen: Anterior Nasal Swab  Result Value Ref Range Status   SARS Coronavirus 2 by RT PCR NEGATIVE NEGATIVE Final    Comment: (NOTE) SARS-CoV-2 target nucleic acids are NOT DETECTED.  The  SARS-CoV-2 RNA is generally detectable in upper and lower respiratory specimens during the acute phase of infection. The lowest concentration of SARS-CoV-2 viral copies this assay can detect is 250 copies / mL. A negative result does not preclude SARS-CoV-2 infection and should not be used as the sole basis for treatment or other patient management decisions.  A negative result may occur with improper specimen collection / handling, submission of specimen other than nasopharyngeal swab, presence of viral mutation(s) within the areas targeted by this assay, and inadequate number of viral copies (<250 copies / mL). A negative result must be combined with clinical observations, patient history, and epidemiological information.  Fact Sheet for Patients:   RoadLapTop.co.za  Fact Sheet for Healthcare Providers: http://kim-miller.com/  This test is not yet approved or  cleared by the [place] FDA and has been authorized for detection and/or diagnosis of SARS-CoV-2 by FDA under an Emergency Use Authorization (EUA).  This EUA will remain in effect (meaning this test can be used) for the duration of the COVID-19 declaration under Section 564(b)(1) of the Act, 21 U.S.C. section 360bbb-3(b)(1), unless the authorization is terminated or revoked sooner.  Performed at [facility], [address] [place], [place] [address]   Respiratory (~20 pathogens) panel by PCR     Status: None   Collection Time: 08/29/23 10:30 AM   Specimen: Nasopharyngeal Swab; Respiratory  Result Value Ref Range Status   Adenovirus NOT DETECTED NOT DETECTED Final   Coronavirus 229E NOT DETECTED NOT DETECTED Final    Comment: (NOTE) The Coronavirus on the Respiratory Panel, DOES NOT test for the novel  Coronavirus (2019 nCoV)    Coronavirus HKU1 NOT DETECTED NOT DETECTED Final   Coronavirus NL63 NOT DETECTED NOT DETECTED Final   Coronavirus OC43 NOT DETECTED  NOT DETECTED Final   Metapneumovirus NOT DETECTED NOT DETECTED Final   Rhinovirus / Enterovirus NOT DETECTED NOT DETECTED Final   Influenza A NOT DETECTED NOT DETECTED Final   Influenza B NOT DETECTED NOT DETECTED Final   Parainfluenza Virus 1 NOT DETECTED NOT DETECTED Final   Parainfluenza Virus 2 NOT DETECTED NOT DETECTED Final   Parainfluenza Virus 3 NOT DETECTED NOT DETECTED Final   Parainfluenza Virus 4 NOT DETECTED NOT DETECTED Final   Respiratory Syncytial Virus NOT DETECTED NOT DETECTED Final   Bordetella pertussis NOT DETECTED NOT DETECTED Final   Bordetella Parapertussis NOT DETECTED NOT DETECTED Final   Chlamydophila pneumoniae NOT DETECTED NOT DETECTED Final   Mycoplasma pneumoniae NOT DETECTED NOT DETECTED Final    Comment: Performed at [facility], [address], [place], [place] [address]         Radiology Studies: DG Chest Port 1 View Result Date: 08/29/2023 CLINICAL DATA:  Cough, thoracic compression fracture EXAM: PORTABLE CHEST 1 VIEW COMPARISON:  08/17/2023, 08/24/2023 FINDINGS: Single frontal view of the chest demonstrates a stable cardiac silhouette. Streaky opacities at the lung bases, right greater than left, favor bibasilar atelectasis. Trace bilateral pleural effusions. No pneumothorax. Interval vertebral augmentation at T7. IMPRESSION: 1. Streaky bibasilar consolidation, right greater than left, favor atelectasis. 2. Trace bilateral pleural effusions. 3. Interval T7 vertebral augmentation. Electronically Signed   By: Sharlet Salina M.D.   On: 08/29/2023 14:53   IR KYPHO THORACIC WITH BONE BIOPSY Result Date: 08/29/2023 INDICATION: 89 year old female presents for vertebral augmentation/treatment of T7 fracture EXAM: IR KYPHO VERTEBRAL THORACIC AUGMENTATION COMPARISON:  None Available. MEDICATIONS: As antibiotic prophylaxis, 2 g Ancef was ordered pre-procedure and administered intravenously within 1 hour of incision. ANESTHESIA/SEDATION: Moderate (conscious)  sedation was employed during this procedure. A total of Versed 1.0 mg and Fentanyl 50 mcg was administered intravenously. Moderate Sedation Time: 24 minutes. The patient's level of consciousness and vital signs were monitored continuously by radiology nursing throughout the procedure under my direct supervision. FLUOROSCOPY TIME:  Fluoroscopy Time:   (46 mGy) COMPLICATIONS: None PROCEDURE: Following a full explanation of the procedure along with the potentially associated complications, a witnessed informed consent was obtained. Specific risks that were discussed included bleeding, infection, injury to adjacent structures, neurologic injury, embolization of cement within the veins, failure of the procedure to improve pain, need for further procedure/ surgery, cardiopulmonary collapse, death. The patient understands the risks and wishes to proceed. The patient was placed prone on the fluoroscopic table. Nasal oxygen was administered. Physiologic monitoring was performed throughout the duration of the procedure. Scout images acquired. The skin overlying the T7 region was prepped and draped in the usual sterile fashion. The vertebral body was identified and the right pedicle was infiltrated with 1% lidocaine. This was then followed by the advancement of an 8-gauge Medtronic needle through the right pedicle  into the anterior 1/3 of the vertebral body. Frontal and lateral images were performed identifying that the cannula extended to the contralateral aspect of the vertebral body. We elected to proceed with Uni pedicular approach. The right-sided balloon was inflated under fluoroscopic observation. Methylmethacrylate mixture was then reconstituted. Under biplane intermittent fluoroscopy, the methylmethacrylate was then injected into the cavity of the vertebral body with excellent left-right filling of the fracture cleft. No extravasation was noted posteriorly into the spinal canal. No epidural venous contamination was  seen. The needle removed. Hemostasis was achieved at the skin entry site. Final images were acquired. Patient tolerated the procedure well and remained hemodynamically stable throughout. No complications were encountered and no significant blood loss. IMPRESSION: Status post image guided T7 vertebral augmentation with kyphoplasty technique. Signed, Yvone Neu. Miachel Roux, RPVI [facility] Electronically Signed   By: Gilmer Mor D.O.   On: 08/29/2023 14:50         Scheduled Meds:  aspirin EC  81 mg Oral Daily   calcium carbonate  1 tablet Oral BID WC   cholecalciferol  2,000 Units Oral Daily   docusate sodium  100 mg Oral Daily   dorzolamide  1 drop Both Eyes BID   enoxaparin (LOVENOX) injection  30 mg Subcutaneous Q24H   latanoprost  1 drop Both Eyes QHS   levothyroxine  100 mcg Oral QAC breakfast   lidocaine  1 patch Transdermal Q24H   magnesium oxide  400 mg Oral Daily   nortriptyline  25 mg Oral QHS   pantoprazole  40 mg Oral Daily   pravastatin  40 mg Oral q1800   pregabalin  50 mg Oral BID   umeclidinium bromide  1 puff Inhalation Daily   Continuous Infusions:      LOS: 5 days    Silvano Bilis, MD [facility]   If 7PM-7AM, please contact night-coverage  08/30/2023, 11:54 AM

## 2023-08-30 NOTE — Progress Notes (Signed)
PT Cancellation Note  Patient Details Name: Robyn Butler MRN: [id number] DOB: Jun 10, 1929   Cancelled Treatment:     PT attempt. Pt asleep upon entry. She does awake but is unable to stay awake. Falls asleep mid sentence. Author will return later this date to progress pt with OOB activity.    Rushie Chestnut 08/30/2023, 8:49 AM

## 2023-08-30 NOTE — TOC Progression Note (Addendum)
Transition of Care [facility]) - Progression Note    Patient Details  Name: Robyn Butler MRN: [id number] Date of Birth: [birth year]  Transition of Care [facility]) CM/SW Contact  Allena Katz, LCSW Phone Number: 08/30/2023, 1:44 PM  Clinical Narrative:   CSW spoke with daughter who states she would like to go [place]. CSW has started [place] for [employer] place. Alvino Chapel notified.       Expected Discharge Plan and Services                                               Social Determinants of Health (SDOH) Interventions SDOH Screenings   Food Insecurity: No Food Insecurity (08/26/2023)  Housing: Low Risk  (08/26/2023)  Transportation Needs: No Transportation Needs (08/25/2023)  Utilities: Not At Risk (08/25/2023)  Alcohol Screen: Low Risk  (01/02/2018)  Depression (PHQ2-9): Low Risk  (12/24/2018)  Financial Resource Strain: Low Risk  (06/11/2023)   Received from [facility]  Physical Activity: Inactive (12/24/2018)  Social Connections: Socially Isolated (08/25/2023)  Stress: No Stress Concern Present (12/21/2017)  Tobacco Use: Medium Risk (08/17/2023)    Readmission Risk Interventions     No data to display

## 2023-08-30 NOTE — Progress Notes (Signed)
Physical Therapy Treatment Patient Details Name: Robyn Butler MRN: [id number] DOB: Jun 29, 1929 Today's Date: 08/30/2023   History of Present Illness Robyn Butler is a 89yoF who [facility] 08/25/23 with worsening back pain x 3 days.  patient has had significant decrease of activity because of fear of more back pain as result she mainly stays in bed for last 2 days and has had significant decrease of oral intake because of the back pain.  1 week ago patient started develop productive cough with yellowish sputum production and shortness of breath and wheezing  PMH: COPD, HTN, HLD, hypoTSH, CKD3a, osteoporosis. Workup revealing on T7 compression fracture, s/p kyphoplasty 08/28/23.    PT Comments  Pt was asleep upon arrival. Seems to be sleep a lot this date. She did sit up on EOB to eat breakfast around 1120 this morning. Pt does awake and is cooperative but quickly fell back to sleep once in recliner. Pt unaware of spine surgery. Chartered loss adjuster educated her and reoriented to situation. Reviewed and educated on spinal precaution. Pt was able to roll left to short sit with increased time and step by step Vcs for sequencing.Total assist to apply TLSO. Pt tolerated standing and ambulating with TLSO on but quickly fell asleep once seated in recliner. PT will continue to follow and progress. DC recs remain appropriate.    If plan is discharge home, recommend the following: A lot of help with bathing/dressing/bathroom;Help with stairs or ramp for entrance;Assist for transportation;Assistance with cooking/housework;Direct supervision/assist for financial management;A lot of help with walking and/or transfers     Equipment Recommendations  Other (comment) (defer to next level of care)       Precautions / Restrictions Precautions Precautions: Fall;Back Precaution Booklet Issued: No Recall of Precautions/Restrictions: Impaired Precaution/Restrictions Comments: pt endorses being in hospital for hip fx. Author  discussed back surgery and attempted to educate pt on spinal precautions. pt needs alot of assistance to place TLSO Required Braces or Orthoses: Spinal Brace Spinal Brace: Thoracolumbosacral orthotic Spinal Brace Comments: wear for ambulation only, doff while sitting Restrictions Weight Bearing Restrictions Per Provider Order: No     Mobility  Bed Mobility Overal bed mobility: Needs Assistance Bed Mobility: Rolling, Sit to Sidelying Rolling: Min assist Sidelying to sit: Min assist, Used rails Supine to sit: Min assist, Used rails  General bed mobility comments: Needed step by step vcs for sequencing and technique    Transfers Overall transfer level: Needs assistance Equipment used: Rolling walker (2 wheels) Transfers: Sit to/from Stand Sit to Stand: Contact guard assist  General transfer comment: CGA for safety with vcs for handplacement and technique improvements. pt required extensive assistance to properly apply TLSO in sitting prior to gait.    Ambulation/Gait Ambulation/Gait assistance: Contact guard assist, Min assist Gait Distance (Feet): 50 Feet Assistive device: Rolling walker (2 wheels) Gait Pattern/deviations: Step-through pattern, Antalgic Gait velocity: decreased  General Gait Details: Pt was able to ambulate ~ 50 ft with RW + TLSO. sao2 > 90% on 2 L. She quickly falls asleep once seated in recliner.    Balance Overall balance assessment: Needs assistance Sitting-balance support: Feet supported Sitting balance-Leahy Scale: Fair Sitting balance - Comments: no LOB while seated EOB however requires extensive assistance to apply TLSO.   Standing balance support: Bilateral upper extremity supported, During functional activity, Reliant on assistive device for balance Standing balance-Leahy Scale: Fair       Musician Communication: No apparent difficulties  Cognition Arousal: Alert Behavior During Therapy: [facility] for  tasks assessed/performed    PT - Cognitive impairments: No apparent impairments    PT - Cognition Comments: Pt is overall lethargic but does stay awake long enough to fully participate. quickly fell back to sleep after ambuation and repositioned in chair. Following commands: Intact      Cueing Cueing Techniques: Verbal cues, Tactile cues     General Comments General comments (skin integrity, edema, etc.): Alot of session spent on education/orientation/ and discussing POC goign forward.      Pertinent Vitals/Pain Pain Assessment Pain Assessment: PAINAD Breathing: normal Negative Vocalization: occasional moan/groan, low speech, negative/disapproving quality Facial Expression: smiling or inexpressive Body Language: relaxed Consolability: no need to console PAINAD Score: 1 Pain Location: LBP Pain Descriptors / Indicators: Grimacing, Guarding Pain Intervention(s): Limited activity within patient's tolerance, Monitored during session, Premedicated before session, Repositioned     PT Goals (current goals can now be found in the care plan section) Acute Rehab PT Goals Patient Stated Goal: get better so I can go home Progress towards PT goals: Progressing toward goals    Frequency    7X/week           Co-evaluation     PT goals addressed during session: Mobility/safety with mobility;Balance;Proper use of DME;Strengthening/ROM        AM-PAC PT "6 Clicks" Mobility   Outcome Measure  Help needed turning from your back to your side while in a flat bed without using bedrails?: A Little Help needed moving from lying on your back to sitting on the side of a flat bed without using bedrails?: A Lot Help needed moving to and from a bed to a chair (including a wheelchair)?: A Lot Help needed standing up from a chair using your arms (e.g., wheelchair or bedside chair)?: A Lot Help needed to walk in hospital room?: A Lot Help needed climbing 3-5 steps with a railing? : A Lot 6 Click Score: 13    End of  Session Equipment Utilized During Treatment: Oxygen (2L throughout) Activity Tolerance: Patient limited by fatigue;Patient limited by lethargy Patient left: in chair;with call bell/phone within reach;with chair alarm set Nurse Communication: Mobility status PT Visit Diagnosis: Unsteadiness on feet (R26.81);Difficulty in walking, not elsewhere classified (R26.2);Other abnormalities of gait and mobility (R26.89);Repeated falls (R29.6);Muscle weakness (generalized) (M62.81);History of falling (Z91.81)     Time: 1610-9604 PT Time Calculation (min) (ACUTE ONLY): 30 min  Charges:    $Gait Training: 8-22 mins $Therapeutic Activity: 8-22 mins PT General Charges $$ ACUTE PT VISIT: 1 Visit                     Jetta Lout PTA 08/30/23, 1:14 PM

## 2023-08-31 DIAGNOSIS — R051 Acute cough: Secondary | ICD-10-CM | POA: Diagnosis not present

## 2023-08-31 DIAGNOSIS — K5900 Constipation, unspecified: Secondary | ICD-10-CM | POA: Diagnosis not present

## 2023-08-31 DIAGNOSIS — S22000D Wedge compression fracture of unspecified thoracic vertebra, subsequent encounter for fracture with routine healing: Secondary | ICD-10-CM | POA: Diagnosis not present

## 2023-08-31 DIAGNOSIS — J189 Pneumonia, unspecified organism: Secondary | ICD-10-CM | POA: Diagnosis not present

## 2023-08-31 DIAGNOSIS — F32A Depression, unspecified: Secondary | ICD-10-CM | POA: Diagnosis not present

## 2023-08-31 DIAGNOSIS — F411 Generalized anxiety disorder: Secondary | ICD-10-CM | POA: Diagnosis not present

## 2023-08-31 DIAGNOSIS — J449 Chronic obstructive pulmonary disease, unspecified: Secondary | ICD-10-CM | POA: Diagnosis not present

## 2023-08-31 DIAGNOSIS — R531 Weakness: Secondary | ICD-10-CM | POA: Diagnosis not present

## 2023-08-31 DIAGNOSIS — Z4789 Encounter for other orthopedic aftercare: Secondary | ICD-10-CM | POA: Diagnosis not present

## 2023-08-31 DIAGNOSIS — Z741 Need for assistance with personal care: Secondary | ICD-10-CM | POA: Diagnosis not present

## 2023-08-31 DIAGNOSIS — R2689 Other abnormalities of gait and mobility: Secondary | ICD-10-CM | POA: Diagnosis not present

## 2023-08-31 DIAGNOSIS — E119 Type 2 diabetes mellitus without complications: Secondary | ICD-10-CM | POA: Diagnosis not present

## 2023-08-31 DIAGNOSIS — N179 Acute kidney failure, unspecified: Secondary | ICD-10-CM | POA: Diagnosis not present

## 2023-08-31 DIAGNOSIS — J209 Acute bronchitis, unspecified: Secondary | ICD-10-CM | POA: Diagnosis not present

## 2023-08-31 DIAGNOSIS — S22060A Wedge compression fracture of T7-T8 vertebra, initial encounter for closed fracture: Secondary | ICD-10-CM | POA: Diagnosis not present

## 2023-08-31 DIAGNOSIS — M6281 Muscle weakness (generalized): Secondary | ICD-10-CM | POA: Diagnosis not present

## 2023-08-31 DIAGNOSIS — J441 Chronic obstructive pulmonary disease with (acute) exacerbation: Secondary | ICD-10-CM | POA: Diagnosis not present

## 2023-08-31 DIAGNOSIS — N189 Chronic kidney disease, unspecified: Secondary | ICD-10-CM | POA: Diagnosis not present

## 2023-08-31 DIAGNOSIS — R41841 Cognitive communication deficit: Secondary | ICD-10-CM | POA: Diagnosis not present

## 2023-08-31 DIAGNOSIS — F5105 Insomnia due to other mental disorder: Secondary | ICD-10-CM | POA: Diagnosis not present

## 2023-08-31 DIAGNOSIS — L988 Other specified disorders of the skin and subcutaneous tissue: Secondary | ICD-10-CM | POA: Diagnosis not present

## 2023-08-31 DIAGNOSIS — R1312 Dysphagia, oropharyngeal phase: Secondary | ICD-10-CM | POA: Diagnosis not present

## 2023-08-31 DIAGNOSIS — Z7401 Bed confinement status: Secondary | ICD-10-CM | POA: Diagnosis not present

## 2023-08-31 DIAGNOSIS — M6259 Muscle wasting and atrophy, not elsewhere classified, multiple sites: Secondary | ICD-10-CM | POA: Diagnosis not present

## 2023-08-31 MED ORDER — OXYCODONE HCL 5 MG PO TABS
5.0000 mg | ORAL_TABLET | ORAL | 0 refills | Status: AC | PRN
Start: 2023-08-31 — End: 2023-09-30

## 2023-08-31 NOTE — TOC Transition Note (Signed)
Transition of Care [facility]) - Discharge Note   Patient Details  Name: Robyn Butler MRN: [id number] Date of Birth: 06-Dec-1928  [facility]) CM/SW Contact:  Truddie Hidden, RN Phone Number: 08/31/2023, 1:44 PM   Clinical Narrative:    Provided  Nurse with details of discharge Patient assigned room # 303-B Nurse will call report to [phone] Face sheet and medical necessity forms printed to the floor to be added to the EMS pack EMS arranged "She's third."  TOC signing off.    Final next level of care: Skilled Nursing Facility Barriers to Discharge: No Barriers Identified   Patient Goals and CMS Choice   CMS Medicare.gov Compare Post Acute Care list provided to:: Patient Choice offered to / list presented to : Patient      Discharge Placement              Patient [facility] bed at: [facility] Patient to be transferred to facility by: [facility] Name of family member notified: Salina April Patient and family notified of of transfer: 08/31/23  Discharge Plan and Services Additional resources added to the After Visit Summary for                                       Social Drivers of Health (SDOH) Interventions SDOH Screenings   Food Insecurity: No Food Insecurity (08/26/2023)  Housing: Low Risk  (08/26/2023)  Transportation Needs: No Transportation Needs (08/25/2023)  Utilities: Not At Risk (08/25/2023)  Alcohol Screen: Low Risk  (01/02/2018)  Depression (PHQ2-9): Low Risk  (12/24/2018)  Financial Resource Strain: Low Risk  (06/11/2023)   Received from [facility]  Physical Activity: Inactive (12/24/2018)  Social Connections: Socially Isolated (08/25/2023)  Stress: No Stress Concern Present (12/21/2017)  Tobacco Use: Medium Risk (08/17/2023)     Readmission Risk Interventions     No data to display

## 2023-08-31 NOTE — Plan of Care (Signed)

## 2023-08-31 NOTE — Plan of Care (Signed)

## 2023-08-31 NOTE — Progress Notes (Signed)
Physical Therapy Treatment Patient Details Name: Robyn Butler MRN: [id number] DOB: Apr 04, 1929 Today's Date: 08/31/2023   History of Present Illness Robyn Butler is a 89yoF who comes to [facility] on 08/25/23 with worsening back pain x 3 days.  patient has had significant decrease of activity because of fear of more back pain as result she mainly stays in bed for last 2 days and has had significant decrease of oral intake because of the back pain.  1 week ago patient started develop productive cough with yellowish sputum production and shortness of breath and wheezing  PMH: COPD, HTN, HLD, hypoTSH, CKD3a, osteoporosis. Workup revealing on T7 compression fracture, s/p kyphoplasty 08/28/23.    PT Comments  Pt still very weak, intermittently somnolent, totalA for TLSO management. MinA required for STS, bed mobility. PT able to marching in place with heavy cues. Pt remains somnolent with intermittent jerking of limbs. Pt assisted back into bed at end of session. Very wet coughing noted during session.    If plan is discharge home, recommend the following: A lot of help with bathing/dressing/bathroom;Help with stairs or ramp for entrance;Assist for transportation;Assistance with cooking/housework;Direct supervision/assist for financial management;A lot of help with walking and/or transfers   Can travel by private vehicle     No  Equipment Recommendations  None recommended by PT    Recommendations for Other Services       Precautions / Restrictions Precautions Precautions: Fall;Back Precaution Booklet Issued: No Recall of Precautions/Restrictions: Impaired Precaution/Restrictions Comments: pt endorses being in hospital for hip fx. Author discussed back surgery and attempted to educate pt on spinal precautions. pt needs alot of assistance to place TLSO Required Braces or Orthoses: Spinal Brace Spinal Brace: Thoracolumbosacral orthotic Spinal Brace Comments: wear for ambulation only, doff while  sitting Restrictions Weight Bearing Restrictions Per Provider Order: No     Mobility  Bed Mobility Overal bed mobility: Needs Assistance       Supine to sit: Min assist Sit to supine: Min assist        Transfers Overall transfer level: Needs assistance Equipment used: Rolling walker (2 wheels) Transfers: Sit to/from Stand Sit to Stand: Min assist                Ambulation/Gait Ambulation/Gait assistance:  (deferred du eot somnolence)           Pre-gait activities: marching in place at Tax adviser Bed    Modified Rankin (Stroke Patients Only)       Horticulturist, commercial    Exercises Other Exercises Other Exercises: STS from EOB: 2 sets of 3 with RW, minA Other Exercises: marching in place at bedside with RW support x20    General Comments        Pertinent Vitals/Pain Pain Assessment Pain Assessment: PAINAD Faces Pain Scale: Hurts a little bit Breathing: normal Negative Vocalization: occasional moan/groan, low  speech, negative/disapproving quality Facial Expression: smiling or inexpressive Body Language: tense, distressed pacing, fidgeting Consolability: distracted or reassured by voice/touch PAINAD Score: 3    Home Living                          Prior Function            PT Goals (current goals can now be found in the care plan section) Acute Rehab PT Goals Patient Stated Goal: get better so I can go home PT Goal Formulation: With patient Time For Goal Achievement: 09/12/23 Potential to Achieve Goals: Good Progress towards PT goals: Progressing toward goals    Frequency    7X/week      PT Plan      Co-evaluation              AM-PAC PT "6 Clicks" Mobility   Outcome Measure  Help needed turning from your  back to your side while in a flat bed without using bedrails?: A Lot Help needed moving from lying on your back to sitting on the side of a flat bed without using bedrails?: A Lot Help needed moving to and from a bed to a chair (including a wheelchair)?: A Lot Help needed standing up from a chair using your arms (e.g., wheelchair or bedside chair)?: A Lot Help needed to walk in hospital room?: A Lot Help needed climbing 3-5 steps with a railing? : A Lot 6 Click Score: 12    End of Session Equipment Utilized During Treatment: Oxygen Activity Tolerance: No increased pain;Patient tolerated treatment well;Patient limited by lethargy Patient left: with call bell/phone within reach;with chair alarm set;in bed Nurse Communication: Mobility status PT Visit Diagnosis: Unsteadiness on feet (R26.81);Difficulty in walking, not elsewhere classified (R26.2);Other abnormalities of gait and mobility (R26.89);Repeated falls (R29.6);Muscle weakness (generalized) (M62.81);History of falling (Z91.81)     Time: 1046-1101 PT Time Calculation (min) (ACUTE ONLY): 15 min  Charges:    $Therapeutic Activity: 8-22 mins PT General Charges $$ ACUTE PT VISIT: 1 Visit                    12:38 PM, 08/31/23 Robyn Butler, PT, DPT Physical Therapist - [facility]  [phone] (ASCOM)     Robyn Butler 08/31/2023, 12:36 PM

## 2023-08-31 NOTE — Discharge Summary (Signed)
Robyn Butler:[id number] DOB: [birth year] DOA: 08/25/2023  PCP: Lauro Regulus, MD  Admit date: 08/25/2023 Discharge date: 08/31/2023  Time spent: 35 minutes  Recommendations for Outpatient Follow-up:  Pcp f/u  May need to re-introduce BP meds at f/u    Discharge Diagnoses:  Principal Problem:   Thoracic compression fracture [facility])   Discharge Condition: stable  Diet recommendation: regular  Filed Weights   08/25/23 1121 08/25/23 1127  Weight: 53 kg 52.6 kg    History of present illness:  From admission h and p Robyn Butler is a 89 y.o. female with medical history significant of COPD, HTN, HLD, hypothyroidism, CKD stage IIIa, osteoporosis, presented with worsening of back pain.   Patient lives by herself and at bedside with steady gait.  3 days ago, patient tried deep to corner of her mattress similarly developed a 10/10 sharp like back pain.  Denies any chest pain or abdominal pain or leg pain. But she does have chronic constipation and urinary incontinence.  Went to see PCP yesterday and was given lidocaine patch however pain well-controlled.  MRI of patient was done which showed T7 compression fracture.  Last 2 days, patient's neighbor has been helping her however patient has had significant decrease of activity because of fear of more back pain as result she mainly stays in bed for last 2 days and has had significant decrease of oral intake because of the back pain.  1 week ago patient started develop productive cough with yellowish sputum production and shortness of breath and wheezing came to ED when she was diagnosed with bronchitis and COPD exacerbation and sent home with antibiotics and prednisone tapering doses and inhalers.  She reported significant improvement of shortness of breath and wheezing and the cough has become dry.  No fever or chills.  Hospital Course:  Patient presents with back pain found to have t7 compression fracture. IR consulted and kyphoplasty was  performed on 2/11. Pain control improved. Discharged to skilled nursing. Also had AKI that resolved with fluids now tolerating PO. Also with hx of copd and mild o2 requirement here, cxr showing atelectasis, discharged with 2 liters O2, advise continuing incentive spirometry at skilled nursing. BPs labile but most recent low normal here, home BP meds held for now.   Procedures: IR kyphoplasty   Consultations: IR  Discharge Exam: Vitals:   08/31/23 0519 08/31/23 0838  BP: (!) 164/50 (!) 133/43  Pulse: 83 74  Resp:  16  Temp:  98.2 F (36.8 C)  SpO2: 98% 100%    General exam: No acute distress Respiratory system: rales at bases, otherwise clear Cardiovascular system: S1-S2, RRR, no murmurs, no pedal edema Gastrointestinal system: Soft, mild distention, positive bowel Central nervous system: Alert and oriented. No focal neurological deficits. Extremities: warm, sensation intact. Skin: No rashes, lesions or ulcers Psychiatry: mild confusion, calm  Discharge Instructions   Discharge Instructions     Diet - low sodium heart healthy   Complete by: As directed    Increase activity slowly   Complete by: As directed    No wound care   Complete by: As directed       Allergies as of 08/31/2023       Reactions   Alendronate Nausea And Vomiting   Other reaction(s): Vomiting        Medication List     STOP taking these medications    amLODipine 5 MG tablet Commonly known as: NORVASC   amoxicillin-clavulanate 875-125 MG tablet Commonly  known as: AUGMENTIN   valsartan-hydrochlorothiazide 160-25 MG tablet Commonly known as: DIOVAN-HCT       TAKE these medications    albuterol 108 (90 Base) MCG/ACT inhaler Commonly known as: VENTOLIN HFA Inhale 2 puffs into the lungs every 6 (six) hours as needed for wheezing or shortness of breath.   aspirin EC 81 MG tablet Take 81 mg by mouth daily.   Cranberry 500 MG Caps Take 500 mg by mouth daily.   cyanocobalamin 1000  MCG tablet Take 1,000 mcg by mouth daily.   dorzolamide 2 % ophthalmic solution Commonly known as: TRUSOPT Place 1 drop into both eyes 2 (two) times daily.   DULCOLAX BALANCE PO Take 2 tablets by mouth as needed.   DULoxetine 30 MG capsule Commonly known as: CYMBALTA Take 30 mg by mouth daily.   ibandronate 150 MG tablet Commonly known as: BONIVA TAKE ONE TABLET ONCE A MONTH FIRST THING IN THE MORNING AT LEAST 1 HOUR BEFORE EATING, TAKE WITH WATER.   latanoprost 0.005 % ophthalmic solution Commonly known as: XALATAN Place 1 drop into both eyes at bedtime.   levothyroxine 100 MCG tablet Commonly known as: SYNTHROID TAKE 1 TABLET BY MOUTH EVERY DAY What changed: when to take this   loperamide 2 MG capsule Commonly known as: IMODIUM Take 2 mg by mouth as needed for diarrhea or loose stools.   lovastatin 40 MG tablet Commonly known as: MEVACOR TAKE 1 TABLET BY MOUTH AT BEDTIME   magnesium oxide 400 MG tablet Commonly known as: MAG-OX Take 400 mg by mouth daily.   nortriptyline 25 MG capsule Commonly known as: PAMELOR TAKE 1 CAPSULE(25 MG) BY MOUTH AT BEDTIME   omeprazole 40 MG capsule Commonly known as: PRILOSEC Take 1 capsule (40 mg total) by mouth daily.   oxybutynin 5 MG 24 hr tablet Commonly known as: DITROPAN-XL Take 5 mg by mouth daily.   oxyCODONE 5 MG immediate release tablet Commonly known as: Oxy IR/ROXICODONE Take 1 tablet (5 mg total) by mouth every 4 (four) hours as needed for moderate pain (pain score 4-6).   pregabalin 50 MG capsule Commonly known as: LYRICA Take 50 mg by mouth 2 (two) times daily.   PreserVision AREDS 2 Caps Take 1 capsule by mouth 2 (two) times daily.   Spiriva Respimat 2.5 MCG/ACT Aers Generic drug: Tiotropium Bromide Monohydrate SMARTSIG:2 Puff(s) By Mouth Daily   Vitamin D3 50 MCG (2000 UT) Tabs Take 1 tablet by mouth daily.   zinc sulfate (50mg  elemental zinc) 220 (50 Zn) MG capsule Take 220 mg by mouth daily.        Allergies  Allergen Reactions   Alendronate Nausea And Vomiting    Other reaction(s): Vomiting    Contact information for follow-up providers     Lauro Regulus, MD Follow up.   Specialty: Internal Medicine Contact information: [address] [facility] [place] [address] [phone]              Contact information for after-discharge care     Destination     [facility] Preferred [facility] .   Service: Skilled Nursing Contact information: [address] [place] [address] [phone]                      The results of significant diagnostics from this hospitalization (including imaging, microbiology, ancillary and laboratory) are listed below for reference.    Significant Diagnostic Studies: DG  Chest Port 1 View Result Date: 08/29/2023 CLINICAL DATA:  Cough, thoracic compression fracture EXAM: PORTABLE CHEST 1 VIEW COMPARISON:  08/17/2023, 08/24/2023 FINDINGS: Single frontal view of the chest demonstrates a stable cardiac silhouette. Streaky opacities at the lung bases, right greater than left, favor bibasilar atelectasis. Trace bilateral pleural effusions. No pneumothorax. Interval vertebral augmentation at T7. IMPRESSION: 1. Streaky bibasilar consolidation, right greater than left, favor atelectasis. 2. Trace bilateral pleural effusions. 3. Interval T7 vertebral augmentation. Electronically Signed   By: Sharlet Salina M.D.   On: 08/29/2023 14:53   IR KYPHO THORACIC WITH BONE BIOPSY Result Date: 08/29/2023 INDICATION: 89 year old female presents for vertebral augmentation/treatment of T7 fracture EXAM: IR KYPHO VERTEBRAL THORACIC AUGMENTATION COMPARISON:  None Available. MEDICATIONS: As antibiotic prophylaxis, 2 g Ancef was ordered pre-procedure and administered intravenously within 1 hour of incision. ANESTHESIA/SEDATION: Moderate (conscious) sedation was employed during this  procedure. A total of Versed 1.0 mg and Fentanyl 50 mcg was administered intravenously. Moderate Sedation Time: 24 minutes. The patient's level of consciousness and vital signs were monitored continuously by radiology nursing throughout the procedure under my direct supervision. FLUOROSCOPY TIME:  Fluoroscopy Time:   (46 mGy) COMPLICATIONS: None PROCEDURE: Following a full explanation of the procedure along with the potentially associated complications, a witnessed informed consent was obtained. Specific risks that were discussed included bleeding, infection, injury to adjacent structures, neurologic injury, embolization of cement within the veins, failure of the procedure to improve pain, need for further procedure/ surgery, cardiopulmonary collapse, death. The patient understands the risks and wishes to proceed. The patient was placed prone on the fluoroscopic table. Nasal oxygen was administered. Physiologic monitoring was performed throughout the duration of the procedure. Scout images acquired. The skin overlying the T7 region was prepped and draped in the usual sterile fashion. The vertebral body was identified and the right pedicle was infiltrated with 1% lidocaine. This was then followed by the advancement of an 8-gauge Medtronic needle through the right pedicle into the anterior 1/3 of the vertebral body. Frontal and lateral images were performed identifying that the cannula extended to the contralateral aspect of the vertebral body. We elected to proceed with Uni pedicular approach. The right-sided balloon was inflated under fluoroscopic observation. Methylmethacrylate mixture was then reconstituted. Under biplane intermittent fluoroscopy, the methylmethacrylate was then injected into the cavity of the vertebral body with excellent left-right filling of the fracture cleft. No extravasation was noted posteriorly into the spinal canal. No epidural venous contamination was seen. The needle removed. Hemostasis  was achieved at the skin entry site. Final images were acquired. Patient tolerated the procedure well and remained hemodynamically stable throughout. No complications were encountered and no significant blood loss. IMPRESSION: Status post image guided T7 vertebral augmentation with kyphoplasty technique. Signed, Yvone Neu. Miachel Roux, RPVI [facility] Electronically Signed   By: Gilmer Mor D.O.   On: 08/29/2023 14:50   US Abdomen Complete Result Date: 08/28/2023 CLINICAL DATA:  Abdominal distension EXAM: ABDOMEN ULTRASOUND COMPLETE COMPARISON:  None Available. FINDINGS: Gallbladder: Prior cholecystectomy. Common bile duct: Diameter: 3 mm Liver: No focal lesion identified. Within normal limits in parenchymal echogenicity. Portal vein is patent on color Doppler imaging with normal direction of blood flow towards the liver. IVC: No abnormality visualized. Pancreas: Visualized portion unremarkable. Spleen: Size and appearance within normal limits. Right Kidney: Length: 9.9 cm. Echogenicity within normal limits. No mass or hydronephrosis visualized. Left Kidney: Length: 9.1 cm. Echogenicity within normal limits. No mass or hydronephrosis  visualized. Abdominal aorta: No aneurysm visualized. Other findings: No free fluid. IMPRESSION: 1. Unremarkable abdominal ultrasound in a patient status post cholecystectomy. Electronically Signed   By: Sharlet Salina M.D.   On: 08/28/2023 15:11   MR THORACIC SPINE WO CONTRAST Result Date: 08/24/2023 CLINICAL DATA:  Mid back pain after coughing episode 1 week ago. EXAM: MRI THORACIC SPINE WITHOUT CONTRAST TECHNIQUE: Multiplanar, multisequence MR imaging of the thoracic spine was performed. No intravenous contrast was administered. COMPARISON:  None Available. FINDINGS: Alignment: No significant listhesis is present. Thoracic kyphosis is slightly exaggerated. Vertebrae: Acute vertebral plana compression fracture is  present at T7. Height is narrowed to less than 8 mm. This compares to 16 mm at the adjacent level. Marrow signal and vertebral body heights are otherwise normal. Cord: Bony elements retropulsed into the spinal canal at T7 efface the ventral surface of the cord. No abnormal signal is present. Cord signal and morphology is otherwise normal. Paraspinal and other soft tissues: The paraspinous soft tissues within normal limits. Disc levels: No significant thoracic disc disease or central canal stenosis is present. The foramina are patent bilaterally. IMPRESSION: 1. Acute vertebral plana compression fracture at T7 with height loss of less than 8 mm. 2. Bony elements retropulsed into the spinal canal at T7 efface the ventral surface of the cord. No abnormal signal is present. 3. No significant thoracic disc disease or central canal stenosis. Electronically Signed   By: Marin Roberts M.D.   On: 08/24/2023 18:07   MR LUMBAR SPINE WO CONTRAST Result Date: 08/24/2023 CLINICAL DATA:  Mid back pain after coughing episode 1 week ago. EXAM: MRI LUMBAR SPINE WITHOUT CONTRAST TECHNIQUE: Multiplanar, multisequence MR imaging of the lumbar spine was performed. No intravenous contrast was administered. COMPARISON:  Lumbar spine radiographs 08/05/2015 FINDINGS: Segmentation: 5 non rib-bearing lumbar type vertebral bodies are present. The lowest fully formed vertebral body is L5. Alignment: No significant listhesis is present. Straightening of the normal lumbar lordosis is present. Mild leftward curvature is centered at L3. Vertebrae: Mild edematous endplate changes are present in association with the superior endplate Schmorl's node at L5. Type 2 Modic changes are present at L2-3 and L3-4. Marrow signal and vertebral body heights are otherwise normal. Conus medullaris and cauda equina: Conus extends to the L1 level. Conus and cauda equina appear normal. Paraspinal and other soft tissues: Limited imaging the abdomen is  unremarkable. There is no significant adenopathy. No solid organ lesions are present. Disc levels: L1-2: The globes and orbits are within normal limits. L2-3: A large broad-based disc protrusion is present. Mild facet hypertrophy and ligamentum flavum thickening is present. This results in moderate central canal stenosis with crowding of the nerve roots. Subarticular narrowing is worse right than left. Moderate right and mild left foraminal stenosis is present. L3-4: A leftward disc protrusion is present. Moderate facet hypertrophy is noted bilaterally. Mild subarticular narrowing is present bilaterally. Mild foraminal narrowing is present bilaterally. L4-5: A broad-based disc protrusion is present. Moderate facet hypertrophy and ligamentum flavum thickening results in severe central canal stenosis. Moderate left and mild right foraminal narrowing is present. L5-S1: A broad-based disc protrusion is present. Moderate subarticular stenosis is present bilaterally. Moderate left and mild right foraminal stenosis is present IMPRESSION: 1. Severe central canal stenosis at L4-5 secondary to a broad-based disc protrusion, facet hypertrophy, and ligamentum flavum thickening. 2. Moderate left and mild right foraminal stenosis at L4-5. 3. Moderate central canal stenosis with crowding of the nerve roots at L2-3. Subarticular narrowing is  worse right than left. Moderate right and mild left foraminal stenosis is present. 4. Moderate subarticular stenosis bilaterally at L5-S1. Moderate left and mild right foraminal stenosis is present. 5. Mild subarticular narrowing bilaterally at L3-4. 6. Mild foraminal narrowing bilaterally at L3-4. Electronically Signed   By: Marin Roberts M.D.   On: 08/24/2023 18:02   DG Chest 1 View Result Date: 08/17/2023 CLINICAL DATA:  Cough chest pain EXAM: CHEST  1 VIEW COMPARISON:  02/26/2022 FINDINGS: Linear scarring at the left base. No consolidation or pleural effusion. Stable  cardiomediastinal silhouette with aortic atherosclerosis. IMPRESSION: No active disease. Linear scarring at the left base. Electronically Signed   By: Jasmine Pang M.D.   On: 08/17/2023 20:03    Microbiology: Recent Results (from the past 240 hours)  SARS Coronavirus 2 by RT PCR (hospital order, performed in [facility] hospital lab) *cepheid single result test* Anterior Nasal Swab     Status: None   Collection Time: 08/29/23 10:30 AM   Specimen: Anterior Nasal Swab  Result Value Ref Range Status   SARS Coronavirus 2 by RT PCR NEGATIVE NEGATIVE Final    Comment: (NOTE) SARS-CoV-2 target nucleic acids are NOT DETECTED.  The SARS-CoV-2 RNA is generally detectable in upper and lower respiratory specimens during the acute phase of infection. The lowest concentration of SARS-CoV-2 viral copies this assay can detect is 250 copies / mL. A negative result does not preclude SARS-CoV-2 infection and should not be used as the sole basis for treatment or other patient management decisions.  A negative result may occur with improper specimen collection / handling, submission of specimen other than nasopharyngeal swab, presence of viral mutation(s) within the areas targeted by this assay, and inadequate number of viral copies (<250 copies / mL). A negative result must be combined with clinical observations, patient history, and epidemiological information.  Fact Sheet for Patients:   RoadLapTop.co.za  Fact Sheet for Healthcare Providers: http://kim-miller.com/  This test is not yet approved or  cleared by the [place] FDA and has been authorized for detection and/or diagnosis of SARS-CoV-2 by FDA under an Emergency Use Authorization (EUA).  This EUA will remain in effect (meaning this test can be used) for the duration of the COVID-19 declaration under Section 564(b)(1) of the Act, 21 U.S.C. section 360bbb-3(b)(1), unless the authorization is  terminated or revoked sooner.  Performed at [facility], [address] [place], [place] [address]   Respiratory (~20 pathogens) panel by PCR     Status: None   Collection Time: 08/29/23 10:30 AM   Specimen: Nasopharyngeal Swab; Respiratory  Result Value Ref Range Status   Adenovirus NOT DETECTED NOT DETECTED Final   Coronavirus 229E NOT DETECTED NOT DETECTED Final    Comment: (NOTE) The Coronavirus on the Respiratory Panel, DOES NOT test for the novel  Coronavirus (2019 nCoV)    Coronavirus HKU1 NOT DETECTED NOT DETECTED Final   Coronavirus NL63 NOT DETECTED NOT DETECTED Final   Coronavirus OC43 NOT DETECTED NOT DETECTED Final   Metapneumovirus NOT DETECTED NOT DETECTED Final   Rhinovirus / Enterovirus NOT DETECTED NOT DETECTED Final   Influenza A NOT DETECTED NOT DETECTED Final   Influenza B NOT DETECTED NOT DETECTED Final   Parainfluenza Virus 1 NOT DETECTED NOT DETECTED Final   Parainfluenza Virus 2 NOT DETECTED NOT DETECTED Final   Parainfluenza Virus 3 NOT DETECTED NOT DETECTED Final   Parainfluenza Virus 4 NOT DETECTED NOT DETECTED Final   Respiratory Syncytial Virus NOT DETECTED NOT  DETECTED Final   Bordetella pertussis NOT DETECTED NOT DETECTED Final   Bordetella Parapertussis NOT DETECTED NOT DETECTED Final   Chlamydophila pneumoniae NOT DETECTED NOT DETECTED Final   Mycoplasma pneumoniae NOT DETECTED NOT DETECTED Final    Comment: Performed at [facility], [address], [place], [place] [address]     Labs: Basic Metabolic Panel: Recent Labs  Lab 08/25/23 1213 08/26/23 0418 08/29/23 0510 08/30/23 0521  NA 132* 135 135 136  K 4.6 4.7 5.0 4.5  CL 94* 100 103 104  CO2 25 26 22 24   GLUCOSE 168* 134* 98 142*  BUN 50* 40* 32* 33*  CREATININE 1.70* 1.33* 1.16* 1.17*  CALCIUM 8.4* 8.3* 8.5* 8.9   Liver Function Tests: No results for input(s): "AST", "ALT", "ALKPHOS", "BILITOT", "PROT", "ALBUMIN" in the last 168 hours. No results for  input(s): "LIPASE", "AMYLASE" in the last 168 hours. No results for input(s): "AMMONIA" in the last 168 hours. CBC: Recent Labs  Lab 08/25/23 1213 08/26/23 0418 08/28/23 0625 08/29/23 0510  WBC 20.5* 13.0* 23.8* 16.2*  NEUTROABS 15.1*  --   --  12.5*  HGB 10.3* 9.1* 8.5* 8.8*  HCT 33.5* 28.8* 28.3* 29.1*  MCV 99.7 97.6 102.9* 100.0  PLT 320 281 294 295   Cardiac Enzymes: No results for input(s): "CKTOTAL", "CKMB", "CKMBINDEX", "TROPONINI" in the last 168 hours. BNP: BNP (last 3 results) No results for input(s): "BNP" in the last 8760 hours.  ProBNP (last 3 results) No results for input(s): "PROBNP" in the last 8760 hours.  CBG: No results for input(s): "GLUCAP" in the last 168 hours.     Signed:  Silvano Bilis MD.  [facility] 08/31/2023, 11:58 AM

## 2023-08-31 NOTE — Progress Notes (Signed)
Gave report to nurse at [facility]. Nurse acknowledged understanding. Patient transported via EMS.

## 2023-08-31 NOTE — TOC Transition Note (Signed)
Transition of Care [facility]) - Discharge Note   Patient Details  Name: Robyn Butler MRN: [id number] Date of Birth: 12/13/1928  [facility]) CM/SW Contact:  Garret Reddish, RN Phone Number: 08/31/2023, 12:36 PM   Clinical Narrative:    Chart reviewed.  Noted that patient has been approved for SNF rehab.  Approval number is [id number] and EMS approval is [phone].  I have informed Alvino Chapel with [facility] that patient has been approved for SNF.  She informs me that she is able to come to the facility today.  I have sent Alvino Chapel patient's SNF transfer summary, Discharge Summary and Discharge orders.    I have informed patient's daughter Salina April that patient will be a discharge for today.    I will arranged transport to [facility] today via [facility].    I have informed staff nurse of the above information.       Final next level of care: Skilled Nursing Facility Barriers to Discharge: No Barriers Identified   Patient Goals and CMS Choice   CMS Medicare.gov Compare Post Acute Care list provided to:: Patient Choice offered to / list presented to : Patient      Discharge Placement              Patient chooses bed at: [facility] Patient to be transferred to facility by: [facility] Name of family member notified: Salina April Patient and family notified of of transfer: 08/31/23  Discharge Plan and Services Additional resources added to the After Visit Summary for                                       Social Drivers of Health (SDOH) Interventions SDOH Screenings   Food Insecurity: No Food Insecurity (08/26/2023)  Housing: Low Risk  (08/26/2023)  Transportation Needs: No Transportation Needs (08/25/2023)  Utilities: Not At Risk (08/25/2023)  Alcohol Screen: Low Risk  (01/02/2018)  Depression (PHQ2-9): Low Risk  (12/24/2018)  Financial Resource Strain: Low Risk  (06/11/2023)   Received from [facility]  Physical Activity:  Inactive (12/24/2018)  Social Connections: Socially Isolated (08/25/2023)  Stress: No Stress Concern Present (12/21/2017)  Tobacco Use: Medium Risk (08/17/2023)     Readmission Risk Interventions     No data to display

## 2023-09-02 DIAGNOSIS — F32A Depression, unspecified: Secondary | ICD-10-CM | POA: Diagnosis not present

## 2023-09-02 DIAGNOSIS — M6281 Muscle weakness (generalized): Secondary | ICD-10-CM | POA: Diagnosis not present

## 2023-09-02 DIAGNOSIS — S22000D Wedge compression fracture of unspecified thoracic vertebra, subsequent encounter for fracture with routine healing: Secondary | ICD-10-CM | POA: Diagnosis not present

## 2023-09-02 DIAGNOSIS — J449 Chronic obstructive pulmonary disease, unspecified: Secondary | ICD-10-CM | POA: Diagnosis not present

## 2023-09-02 DIAGNOSIS — R2689 Other abnormalities of gait and mobility: Secondary | ICD-10-CM | POA: Diagnosis not present

## 2023-09-02 DIAGNOSIS — Z741 Need for assistance with personal care: Secondary | ICD-10-CM | POA: Diagnosis not present

## 2023-09-02 DIAGNOSIS — M6259 Muscle wasting and atrophy, not elsewhere classified, multiple sites: Secondary | ICD-10-CM | POA: Diagnosis not present

## 2023-09-04 DIAGNOSIS — N179 Acute kidney failure, unspecified: Secondary | ICD-10-CM | POA: Diagnosis not present

## 2023-09-04 DIAGNOSIS — E119 Type 2 diabetes mellitus without complications: Secondary | ICD-10-CM | POA: Diagnosis not present

## 2023-09-04 DIAGNOSIS — K5900 Constipation, unspecified: Secondary | ICD-10-CM | POA: Diagnosis not present

## 2023-09-04 DIAGNOSIS — J441 Chronic obstructive pulmonary disease with (acute) exacerbation: Secondary | ICD-10-CM | POA: Diagnosis not present

## 2023-09-04 DIAGNOSIS — J209 Acute bronchitis, unspecified: Secondary | ICD-10-CM | POA: Diagnosis not present

## 2023-09-04 DIAGNOSIS — N189 Chronic kidney disease, unspecified: Secondary | ICD-10-CM | POA: Diagnosis not present

## 2023-09-04 DIAGNOSIS — S22060A Wedge compression fracture of T7-T8 vertebra, initial encounter for closed fracture: Secondary | ICD-10-CM | POA: Diagnosis not present

## 2023-09-04 DIAGNOSIS — L988 Other specified disorders of the skin and subcutaneous tissue: Secondary | ICD-10-CM | POA: Diagnosis not present

## 2023-09-06 DIAGNOSIS — E119 Type 2 diabetes mellitus without complications: Secondary | ICD-10-CM | POA: Diagnosis not present

## 2023-09-06 DIAGNOSIS — N189 Chronic kidney disease, unspecified: Secondary | ICD-10-CM | POA: Diagnosis not present

## 2023-09-06 DIAGNOSIS — J209 Acute bronchitis, unspecified: Secondary | ICD-10-CM | POA: Diagnosis not present

## 2023-09-06 DIAGNOSIS — K5900 Constipation, unspecified: Secondary | ICD-10-CM | POA: Diagnosis not present

## 2023-09-06 DIAGNOSIS — N179 Acute kidney failure, unspecified: Secondary | ICD-10-CM | POA: Diagnosis not present

## 2023-09-06 DIAGNOSIS — J441 Chronic obstructive pulmonary disease with (acute) exacerbation: Secondary | ICD-10-CM | POA: Diagnosis not present

## 2023-09-06 DIAGNOSIS — F5105 Insomnia due to other mental disorder: Secondary | ICD-10-CM | POA: Diagnosis not present

## 2023-09-06 DIAGNOSIS — F411 Generalized anxiety disorder: Secondary | ICD-10-CM | POA: Diagnosis not present

## 2023-09-06 DIAGNOSIS — S22060A Wedge compression fracture of T7-T8 vertebra, initial encounter for closed fracture: Secondary | ICD-10-CM | POA: Diagnosis not present

## 2023-09-10 DIAGNOSIS — J441 Chronic obstructive pulmonary disease with (acute) exacerbation: Secondary | ICD-10-CM | POA: Diagnosis not present

## 2023-09-10 DIAGNOSIS — N179 Acute kidney failure, unspecified: Secondary | ICD-10-CM | POA: Diagnosis not present

## 2023-09-10 DIAGNOSIS — N189 Chronic kidney disease, unspecified: Secondary | ICD-10-CM | POA: Diagnosis not present

## 2023-09-10 DIAGNOSIS — R051 Acute cough: Secondary | ICD-10-CM | POA: Diagnosis not present

## 2023-09-10 DIAGNOSIS — E119 Type 2 diabetes mellitus without complications: Secondary | ICD-10-CM | POA: Diagnosis not present

## 2023-09-10 DIAGNOSIS — K5900 Constipation, unspecified: Secondary | ICD-10-CM | POA: Diagnosis not present

## 2023-09-10 DIAGNOSIS — S22060A Wedge compression fracture of T7-T8 vertebra, initial encounter for closed fracture: Secondary | ICD-10-CM | POA: Diagnosis not present

## 2023-09-10 DIAGNOSIS — J209 Acute bronchitis, unspecified: Secondary | ICD-10-CM | POA: Diagnosis not present

## 2023-09-12 DIAGNOSIS — J189 Pneumonia, unspecified organism: Secondary | ICD-10-CM | POA: Diagnosis not present

## 2023-09-12 DIAGNOSIS — N189 Chronic kidney disease, unspecified: Secondary | ICD-10-CM | POA: Diagnosis not present

## 2023-09-12 DIAGNOSIS — J441 Chronic obstructive pulmonary disease with (acute) exacerbation: Secondary | ICD-10-CM | POA: Diagnosis not present

## 2023-09-12 DIAGNOSIS — E119 Type 2 diabetes mellitus without complications: Secondary | ICD-10-CM | POA: Diagnosis not present

## 2023-09-12 DIAGNOSIS — N179 Acute kidney failure, unspecified: Secondary | ICD-10-CM | POA: Diagnosis not present

## 2023-09-12 DIAGNOSIS — K5900 Constipation, unspecified: Secondary | ICD-10-CM | POA: Diagnosis not present

## 2023-09-12 DIAGNOSIS — S22060A Wedge compression fracture of T7-T8 vertebra, initial encounter for closed fracture: Secondary | ICD-10-CM | POA: Diagnosis not present

## 2023-09-12 DIAGNOSIS — J209 Acute bronchitis, unspecified: Secondary | ICD-10-CM | POA: Diagnosis not present

## 2023-09-13 DIAGNOSIS — K5900 Constipation, unspecified: Secondary | ICD-10-CM | POA: Diagnosis not present

## 2023-09-13 DIAGNOSIS — E119 Type 2 diabetes mellitus without complications: Secondary | ICD-10-CM | POA: Diagnosis not present

## 2023-09-13 DIAGNOSIS — J441 Chronic obstructive pulmonary disease with (acute) exacerbation: Secondary | ICD-10-CM | POA: Diagnosis not present

## 2023-09-13 DIAGNOSIS — N179 Acute kidney failure, unspecified: Secondary | ICD-10-CM | POA: Diagnosis not present

## 2023-09-13 DIAGNOSIS — S22060A Wedge compression fracture of T7-T8 vertebra, initial encounter for closed fracture: Secondary | ICD-10-CM | POA: Diagnosis not present

## 2023-09-13 DIAGNOSIS — J209 Acute bronchitis, unspecified: Secondary | ICD-10-CM | POA: Diagnosis not present

## 2023-09-13 DIAGNOSIS — N189 Chronic kidney disease, unspecified: Secondary | ICD-10-CM | POA: Diagnosis not present

## 2023-09-14 DIAGNOSIS — N179 Acute kidney failure, unspecified: Secondary | ICD-10-CM | POA: Diagnosis not present

## 2023-09-14 DIAGNOSIS — E119 Type 2 diabetes mellitus without complications: Secondary | ICD-10-CM | POA: Diagnosis not present

## 2023-09-14 DIAGNOSIS — S22060A Wedge compression fracture of T7-T8 vertebra, initial encounter for closed fracture: Secondary | ICD-10-CM | POA: Diagnosis not present

## 2023-09-14 DIAGNOSIS — K5900 Constipation, unspecified: Secondary | ICD-10-CM | POA: Diagnosis not present

## 2023-09-14 DIAGNOSIS — N189 Chronic kidney disease, unspecified: Secondary | ICD-10-CM | POA: Diagnosis not present

## 2023-09-14 DIAGNOSIS — J209 Acute bronchitis, unspecified: Secondary | ICD-10-CM | POA: Diagnosis not present

## 2023-09-14 DIAGNOSIS — J441 Chronic obstructive pulmonary disease with (acute) exacerbation: Secondary | ICD-10-CM | POA: Diagnosis not present

## 2023-09-14 DIAGNOSIS — J189 Pneumonia, unspecified organism: Secondary | ICD-10-CM | POA: Diagnosis not present

## 2023-09-18 DIAGNOSIS — N189 Chronic kidney disease, unspecified: Secondary | ICD-10-CM | POA: Diagnosis not present

## 2023-09-18 DIAGNOSIS — E119 Type 2 diabetes mellitus without complications: Secondary | ICD-10-CM | POA: Diagnosis not present

## 2023-09-18 DIAGNOSIS — J209 Acute bronchitis, unspecified: Secondary | ICD-10-CM | POA: Diagnosis not present

## 2023-09-18 DIAGNOSIS — J189 Pneumonia, unspecified organism: Secondary | ICD-10-CM | POA: Diagnosis not present

## 2023-09-18 DIAGNOSIS — J441 Chronic obstructive pulmonary disease with (acute) exacerbation: Secondary | ICD-10-CM | POA: Diagnosis not present

## 2023-09-18 DIAGNOSIS — N179 Acute kidney failure, unspecified: Secondary | ICD-10-CM | POA: Diagnosis not present

## 2023-09-18 DIAGNOSIS — S22060A Wedge compression fracture of T7-T8 vertebra, initial encounter for closed fracture: Secondary | ICD-10-CM | POA: Diagnosis not present

## 2023-09-18 DIAGNOSIS — K5900 Constipation, unspecified: Secondary | ICD-10-CM | POA: Diagnosis not present

## 2023-09-19 ENCOUNTER — Other Ambulatory Visit: Payer: Self-pay

## 2023-09-19 DIAGNOSIS — D631 Anemia in chronic kidney disease: Secondary | ICD-10-CM

## 2023-09-20 ENCOUNTER — Inpatient Hospital Stay: Payer: PPO | Attending: Internal Medicine

## 2023-09-20 ENCOUNTER — Inpatient Hospital Stay: Payer: PPO

## 2023-09-25 DIAGNOSIS — J209 Acute bronchitis, unspecified: Secondary | ICD-10-CM | POA: Diagnosis not present

## 2023-09-25 DIAGNOSIS — N189 Chronic kidney disease, unspecified: Secondary | ICD-10-CM | POA: Diagnosis not present

## 2023-09-25 DIAGNOSIS — J189 Pneumonia, unspecified organism: Secondary | ICD-10-CM | POA: Diagnosis not present

## 2023-09-25 DIAGNOSIS — E119 Type 2 diabetes mellitus without complications: Secondary | ICD-10-CM | POA: Diagnosis not present

## 2023-09-25 DIAGNOSIS — S22060A Wedge compression fracture of T7-T8 vertebra, initial encounter for closed fracture: Secondary | ICD-10-CM | POA: Diagnosis not present

## 2023-09-25 DIAGNOSIS — K5900 Constipation, unspecified: Secondary | ICD-10-CM | POA: Diagnosis not present

## 2023-09-25 DIAGNOSIS — N179 Acute kidney failure, unspecified: Secondary | ICD-10-CM | POA: Diagnosis not present

## 2023-09-25 DIAGNOSIS — J441 Chronic obstructive pulmonary disease with (acute) exacerbation: Secondary | ICD-10-CM | POA: Diagnosis not present

## 2023-10-01 DIAGNOSIS — J189 Pneumonia, unspecified organism: Secondary | ICD-10-CM | POA: Diagnosis not present

## 2023-10-01 DIAGNOSIS — S22060A Wedge compression fracture of T7-T8 vertebra, initial encounter for closed fracture: Secondary | ICD-10-CM | POA: Diagnosis not present

## 2023-10-01 DIAGNOSIS — K5900 Constipation, unspecified: Secondary | ICD-10-CM | POA: Diagnosis not present

## 2023-10-01 DIAGNOSIS — J441 Chronic obstructive pulmonary disease with (acute) exacerbation: Secondary | ICD-10-CM | POA: Diagnosis not present

## 2023-10-01 DIAGNOSIS — E119 Type 2 diabetes mellitus without complications: Secondary | ICD-10-CM | POA: Diagnosis not present

## 2023-10-01 DIAGNOSIS — N189 Chronic kidney disease, unspecified: Secondary | ICD-10-CM | POA: Diagnosis not present

## 2023-10-01 DIAGNOSIS — N179 Acute kidney failure, unspecified: Secondary | ICD-10-CM | POA: Diagnosis not present

## 2023-10-01 DIAGNOSIS — J209 Acute bronchitis, unspecified: Secondary | ICD-10-CM | POA: Diagnosis not present

## 2023-10-04 DIAGNOSIS — Z79891 Long term (current) use of opiate analgesic: Secondary | ICD-10-CM | POA: Diagnosis not present

## 2023-10-04 DIAGNOSIS — Z604 Social exclusion and rejection: Secondary | ICD-10-CM | POA: Diagnosis not present

## 2023-10-04 DIAGNOSIS — E039 Hypothyroidism, unspecified: Secondary | ICD-10-CM | POA: Diagnosis not present

## 2023-10-04 DIAGNOSIS — J440 Chronic obstructive pulmonary disease with acute lower respiratory infection: Secondary | ICD-10-CM | POA: Diagnosis not present

## 2023-10-04 DIAGNOSIS — I129 Hypertensive chronic kidney disease with stage 1 through stage 4 chronic kidney disease, or unspecified chronic kidney disease: Secondary | ICD-10-CM | POA: Diagnosis not present

## 2023-10-04 DIAGNOSIS — Z556 Problems related to health literacy: Secondary | ICD-10-CM | POA: Diagnosis not present

## 2023-10-04 DIAGNOSIS — J189 Pneumonia, unspecified organism: Secondary | ICD-10-CM | POA: Diagnosis not present

## 2023-10-04 DIAGNOSIS — Z9981 Dependence on supplemental oxygen: Secondary | ICD-10-CM | POA: Diagnosis not present

## 2023-10-04 DIAGNOSIS — E1136 Type 2 diabetes mellitus with diabetic cataract: Secondary | ICD-10-CM | POA: Diagnosis not present

## 2023-10-04 DIAGNOSIS — J441 Chronic obstructive pulmonary disease with (acute) exacerbation: Secondary | ICD-10-CM | POA: Diagnosis not present

## 2023-10-04 DIAGNOSIS — N1831 Chronic kidney disease, stage 3a: Secondary | ICD-10-CM | POA: Diagnosis not present

## 2023-10-04 DIAGNOSIS — M8008XD Age-related osteoporosis with current pathological fracture, vertebra(e), subsequent encounter for fracture with routine healing: Secondary | ICD-10-CM | POA: Diagnosis not present

## 2023-10-04 DIAGNOSIS — Z859 Personal history of malignant neoplasm, unspecified: Secondary | ICD-10-CM | POA: Diagnosis not present

## 2023-10-04 DIAGNOSIS — J209 Acute bronchitis, unspecified: Secondary | ICD-10-CM | POA: Diagnosis not present

## 2023-10-04 DIAGNOSIS — Z7982 Long term (current) use of aspirin: Secondary | ICD-10-CM | POA: Diagnosis not present

## 2023-10-04 DIAGNOSIS — K5900 Constipation, unspecified: Secondary | ICD-10-CM | POA: Diagnosis not present

## 2023-10-04 DIAGNOSIS — Z8701 Personal history of pneumonia (recurrent): Secondary | ICD-10-CM | POA: Diagnosis not present

## 2023-10-04 DIAGNOSIS — E1122 Type 2 diabetes mellitus with diabetic chronic kidney disease: Secondary | ICD-10-CM | POA: Diagnosis not present

## 2023-10-04 DIAGNOSIS — J449 Chronic obstructive pulmonary disease, unspecified: Secondary | ICD-10-CM | POA: Diagnosis not present

## 2023-10-04 DIAGNOSIS — E785 Hyperlipidemia, unspecified: Secondary | ICD-10-CM | POA: Diagnosis not present

## 2023-10-04 DIAGNOSIS — F32A Depression, unspecified: Secondary | ICD-10-CM | POA: Diagnosis not present

## 2023-10-04 DIAGNOSIS — N179 Acute kidney failure, unspecified: Secondary | ICD-10-CM | POA: Diagnosis not present

## 2023-10-04 DIAGNOSIS — E1140 Type 2 diabetes mellitus with diabetic neuropathy, unspecified: Secondary | ICD-10-CM | POA: Diagnosis not present

## 2023-10-04 DIAGNOSIS — Z87891 Personal history of nicotine dependence: Secondary | ICD-10-CM | POA: Diagnosis not present

## 2023-10-08 DIAGNOSIS — J441 Chronic obstructive pulmonary disease with (acute) exacerbation: Secondary | ICD-10-CM | POA: Diagnosis not present

## 2023-10-08 DIAGNOSIS — E1122 Type 2 diabetes mellitus with diabetic chronic kidney disease: Secondary | ICD-10-CM | POA: Diagnosis not present

## 2023-10-08 DIAGNOSIS — M8000XD Age-related osteoporosis with current pathological fracture, unspecified site, subsequent encounter for fracture with routine healing: Secondary | ICD-10-CM | POA: Diagnosis not present

## 2023-10-08 DIAGNOSIS — M8000XA Age-related osteoporosis with current pathological fracture, unspecified site, initial encounter for fracture: Secondary | ICD-10-CM | POA: Diagnosis not present

## 2023-10-08 DIAGNOSIS — N183 Chronic kidney disease, stage 3 unspecified: Secondary | ICD-10-CM | POA: Diagnosis not present

## 2023-10-22 ENCOUNTER — Inpatient Hospital Stay: Payer: PPO | Attending: Internal Medicine

## 2023-10-22 ENCOUNTER — Inpatient Hospital Stay: Payer: PPO

## 2023-10-22 VITALS — BP 119/49

## 2023-10-22 DIAGNOSIS — D631 Anemia in chronic kidney disease: Secondary | ICD-10-CM | POA: Diagnosis not present

## 2023-10-22 DIAGNOSIS — N1832 Chronic kidney disease, stage 3b: Secondary | ICD-10-CM | POA: Insufficient documentation

## 2023-10-22 LAB — CBC WITH DIFFERENTIAL (CANCER CENTER ONLY)
Abs Immature Granulocytes: 0.04 10*3/uL (ref 0.00–0.07)
Basophils Absolute: 0 10*3/uL (ref 0.0–0.1)
Basophils Relative: 0 %
Eosinophils Absolute: 0.1 10*3/uL (ref 0.0–0.5)
Eosinophils Relative: 1 %
HCT: 29.8 % — ABNORMAL LOW (ref 36.0–46.0)
Hemoglobin: 8.7 g/dL — ABNORMAL LOW (ref 12.0–15.0)
Immature Granulocytes: 0 %
Lymphocytes Relative: 16 %
Lymphs Abs: 1.6 10*3/uL (ref 0.7–4.0)
MCH: 30 pg (ref 26.0–34.0)
MCHC: 29.2 g/dL — ABNORMAL LOW (ref 30.0–36.0)
MCV: 102.8 fL — ABNORMAL HIGH (ref 80.0–100.0)
Monocytes Absolute: 0.8 10*3/uL (ref 0.1–1.0)
Monocytes Relative: 8 %
Neutro Abs: 7 10*3/uL (ref 1.7–7.7)
Neutrophils Relative %: 75 %
Platelet Count: 235 10*3/uL (ref 150–400)
RBC: 2.9 MIL/uL — ABNORMAL LOW (ref 3.87–5.11)
RDW: 14 % (ref 11.5–15.5)
WBC Count: 9.5 10*3/uL (ref 4.0–10.5)
nRBC: 0 % (ref 0.0–0.2)

## 2023-10-22 MED ORDER — EPOETIN ALFA 20000 UNIT/ML IJ SOLN
20000.0000 [IU] | Freq: Once | INTRAMUSCULAR | Status: AC
  Administered 2023-10-22: 20000 [IU] via SUBCUTANEOUS
  Filled 2023-10-22: qty 1

## 2023-10-25 DIAGNOSIS — Y92009 Unspecified place in unspecified non-institutional (private) residence as the place of occurrence of the external cause: Secondary | ICD-10-CM | POA: Diagnosis not present

## 2023-10-25 DIAGNOSIS — M25551 Pain in right hip: Secondary | ICD-10-CM | POA: Diagnosis not present

## 2023-10-25 DIAGNOSIS — W010XXA Fall on same level from slipping, tripping and stumbling without subsequent striking against object, initial encounter: Secondary | ICD-10-CM | POA: Diagnosis not present

## 2023-10-25 DIAGNOSIS — F4489 Other dissociative and conversion disorders: Secondary | ICD-10-CM | POA: Diagnosis not present

## 2023-10-26 DIAGNOSIS — M25551 Pain in right hip: Secondary | ICD-10-CM | POA: Diagnosis not present

## 2023-10-26 DIAGNOSIS — W19XXXD Unspecified fall, subsequent encounter: Secondary | ICD-10-CM | POA: Diagnosis not present

## 2023-10-26 DIAGNOSIS — W010XXA Fall on same level from slipping, tripping and stumbling without subsequent striking against object, initial encounter: Secondary | ICD-10-CM | POA: Diagnosis not present

## 2023-10-26 DIAGNOSIS — Y92009 Unspecified place in unspecified non-institutional (private) residence as the place of occurrence of the external cause: Secondary | ICD-10-CM | POA: Diagnosis not present

## 2023-10-26 DIAGNOSIS — F4489 Other dissociative and conversion disorders: Secondary | ICD-10-CM | POA: Diagnosis not present

## 2023-10-31 DIAGNOSIS — N179 Acute kidney failure, unspecified: Secondary | ICD-10-CM | POA: Diagnosis not present

## 2023-10-31 DIAGNOSIS — J441 Chronic obstructive pulmonary disease with (acute) exacerbation: Secondary | ICD-10-CM | POA: Diagnosis not present

## 2023-10-31 DIAGNOSIS — M8008XD Age-related osteoporosis with current pathological fracture, vertebra(e), subsequent encounter for fracture with routine healing: Secondary | ICD-10-CM | POA: Diagnosis not present

## 2023-10-31 DIAGNOSIS — J209 Acute bronchitis, unspecified: Secondary | ICD-10-CM | POA: Diagnosis not present

## 2023-10-31 DIAGNOSIS — E1136 Type 2 diabetes mellitus with diabetic cataract: Secondary | ICD-10-CM | POA: Diagnosis not present

## 2023-10-31 DIAGNOSIS — E1140 Type 2 diabetes mellitus with diabetic neuropathy, unspecified: Secondary | ICD-10-CM | POA: Diagnosis not present

## 2023-10-31 DIAGNOSIS — J440 Chronic obstructive pulmonary disease with acute lower respiratory infection: Secondary | ICD-10-CM | POA: Diagnosis not present

## 2023-11-01 DIAGNOSIS — J449 Chronic obstructive pulmonary disease, unspecified: Secondary | ICD-10-CM | POA: Diagnosis not present

## 2023-11-20 ENCOUNTER — Inpatient Hospital Stay: Payer: PPO | Attending: Internal Medicine

## 2023-11-20 ENCOUNTER — Inpatient Hospital Stay: Payer: PPO

## 2023-11-20 ENCOUNTER — Inpatient Hospital Stay: Payer: PPO | Admitting: Internal Medicine

## 2023-11-21 ENCOUNTER — Inpatient Hospital Stay: Payer: PPO

## 2023-11-21 DIAGNOSIS — E119 Type 2 diabetes mellitus without complications: Secondary | ICD-10-CM | POA: Diagnosis not present

## 2023-11-21 DIAGNOSIS — Z961 Presence of intraocular lens: Secondary | ICD-10-CM | POA: Diagnosis not present

## 2023-11-22 ENCOUNTER — Inpatient Hospital Stay: Attending: Internal Medicine

## 2023-11-22 ENCOUNTER — Inpatient Hospital Stay

## 2023-11-22 ENCOUNTER — Encounter: Payer: Self-pay | Admitting: Nurse Practitioner

## 2023-11-22 ENCOUNTER — Inpatient Hospital Stay: Admitting: Nurse Practitioner

## 2023-11-22 VITALS — BP 108/48 | HR 97 | Temp 96.1°F | Resp 18 | Ht 63.0 in | Wt 102.0 lb

## 2023-11-22 DIAGNOSIS — N1832 Chronic kidney disease, stage 3b: Secondary | ICD-10-CM

## 2023-11-22 DIAGNOSIS — D631 Anemia in chronic kidney disease: Secondary | ICD-10-CM

## 2023-11-22 DIAGNOSIS — D638 Anemia in other chronic diseases classified elsewhere: Secondary | ICD-10-CM | POA: Diagnosis not present

## 2023-11-22 LAB — CBC WITH DIFFERENTIAL (CANCER CENTER ONLY)
Abs Immature Granulocytes: 0.01 10*3/uL (ref 0.00–0.07)
Basophils Absolute: 0 10*3/uL (ref 0.0–0.1)
Basophils Relative: 0 %
Eosinophils Absolute: 0.1 10*3/uL (ref 0.0–0.5)
Eosinophils Relative: 1 %
HCT: 34 % — ABNORMAL LOW (ref 36.0–46.0)
Hemoglobin: 10.2 g/dL — ABNORMAL LOW (ref 12.0–15.0)
Immature Granulocytes: 0 %
Lymphocytes Relative: 23 %
Lymphs Abs: 1.3 10*3/uL (ref 0.7–4.0)
MCH: 30.7 pg (ref 26.0–34.0)
MCHC: 30 g/dL (ref 30.0–36.0)
MCV: 102.4 fL — ABNORMAL HIGH (ref 80.0–100.0)
Monocytes Absolute: 0.4 10*3/uL (ref 0.1–1.0)
Monocytes Relative: 7 %
Neutro Abs: 3.7 10*3/uL (ref 1.7–7.7)
Neutrophils Relative %: 69 %
Platelet Count: 242 10*3/uL (ref 150–400)
RBC: 3.32 MIL/uL — ABNORMAL LOW (ref 3.87–5.11)
RDW: 13.4 % (ref 11.5–15.5)
WBC Count: 5.4 10*3/uL (ref 4.0–10.5)
nRBC: 0 % (ref 0.0–0.2)

## 2023-11-22 LAB — IRON AND TIBC
Iron: 120 ug/dL (ref 28–170)
Saturation Ratios: 56 % — ABNORMAL HIGH (ref 10.4–31.8)
TIBC: 216 ug/dL — ABNORMAL LOW (ref 250–450)
UIBC: 96 ug/dL

## 2023-11-22 LAB — BASIC METABOLIC PANEL WITH GFR
Anion gap: 13 (ref 5–15)
BUN: 28 mg/dL — ABNORMAL HIGH (ref 8–23)
CO2: 22 mmol/L (ref 22–32)
Calcium: 8.6 mg/dL — ABNORMAL LOW (ref 8.9–10.3)
Chloride: 107 mmol/L (ref 98–111)
Creatinine, Ser: 1.23 mg/dL — ABNORMAL HIGH (ref 0.44–1.00)
GFR, Estimated: 41 mL/min — ABNORMAL LOW (ref >60)
Glucose, Bld: 154 mg/dL — ABNORMAL HIGH (ref 70–99)
Potassium: 3.9 mmol/L (ref 3.5–5.1)
Sodium: 142 mmol/L (ref 135–145)

## 2023-11-22 LAB — FERRITIN: Ferritin: 540 ng/mL — ABNORMAL HIGH (ref 11–307)

## 2023-11-22 MED ORDER — EPOETIN ALFA 20000 UNIT/ML IJ SOLN
20000.0000 [IU] | Freq: Once | INTRAMUSCULAR | Status: AC
  Administered 2023-11-22: 20000 [IU] via SUBCUTANEOUS
  Filled 2023-11-22: qty 1

## 2023-11-22 NOTE — Progress Notes (Signed)
 [facility] CONSULT NOTE  Patient Care Team: Jimmy Moulding, MD as PCP - General (Internal Medicine) Dingeldein, Landon Pinion, MD as Consulting Physician (Ophthalmology) Gwyn Leos, MD as Consulting Physician (Hematology and Oncology)  CHIEF COMPLAINTS/PURPOSE OF CONSULTATION: Anemia  HEMATOLOGY HISTORY  # Anemia CKD-III Birdia Buhl 2021- Hb 8-9;[name];  Ferritin-100; PCP] [EGD/; colonoscopy-[facility];2016 ; capsule-]; iron  saturation 20% ferritin-68; no hemolysis; on retacrit  q monthly.   # CKD-III [GFR40s]  # COPD   HISTORY OF PRESENTING ILLNESS: Ambulating independently. Accompanied by daughter  Robyn Butler 89 y.o.  female anemia/chronic kidney disease is here for follow-up. In interim, she was hospitalized for thoracic compression fracture. She was at rehab, now home. No blood in stools or black-colored stools. No nausea no vomiting. Denies complaints.   Review of Systems  Constitutional:  Positive for malaise/fatigue. Negative for chills, diaphoresis, fever and weight loss.  HENT:  Negative for nosebleeds and sore throat.   Eyes:  Negative for double vision.  Respiratory:  Negative for hemoptysis, sputum production and wheezing.   Cardiovascular:  Negative for chest pain, palpitations, orthopnea and leg swelling.  Gastrointestinal:  Negative for abdominal pain, blood in stool, constipation, diarrhea, heartburn, melena, nausea and vomiting.  Genitourinary:  Negative for dysuria, frequency and urgency.  Musculoskeletal:  Positive for back pain and joint pain.  Skin: Negative.  Negative for itching and rash.  Neurological:  Negative for dizziness, tingling, focal weakness, weakness and headaches.  Endo/Heme/Allergies:  Does not bruise/bleed easily.  Psychiatric/Behavioral:  Negative for depression. The patient is not nervous/anxious and does not have insomnia.    MEDICAL HISTORY:  Past Medical History:  Diagnosis Date   Arthritis    Cancer (HCC)     Chronic kidney disease    COPD (chronic obstructive pulmonary disease) (HCC)    Diabetes mellitus without complication (HCC)    type 2   Hypertension    Varicose veins of lower extremities with other complications    SURGICAL HISTORY: Past Surgical History:  Procedure Laterality Date   ABDOMINAL HYSTERECTOMY  1972   BLADDER SUSPENSION     BREAST EXCISIONAL BIOPSY Right 1999   neg   CHOLECYSTECTOMY  1975   COLONOSCOPY     COLONOSCOPY WITH PROPOFOL  N/A 05/24/2015   Procedure: COLONOSCOPY WITH PROPOFOL ;  Surgeon: Stephens Eis, MD;  Location: [facility] ENDOSCOPY;  Service: Gastroenterology;  Laterality: N/A;   CYSTOSCOPY  1972   ESOPHAGOGASTRODUODENOSCOPY N/A 05/24/2015   Procedure: ESOPHAGOGASTRODUODENOSCOPY (EGD);  Surgeon: Stephens Eis, MD;  Location: [facility] ENDOSCOPY;  Service: Gastroenterology;  Laterality: N/A;   FRACTURE SURGERY     IR KYPHO THORACIC WITH BONE BIOPSY  08/28/2023   JOINT REPLACEMENT Right 2013   hip   salpingo oophorectmy      THYROIDECTOMY  1969   WRIST FRACTURE SURGERY Left    SOCIAL HISTORY: Social History   Socioeconomic History   Marital status: Widowed    Spouse name: Not on file   Number of children: 2   Years of education: 9th Grade   Highest education level: 9th grade  Occupational History   Occupation: Retired  Tobacco Use   Smoking status: Former    Current packs/day: 0.00    Types: Cigarettes    Quit date: 07/18/1991    Years since quitting: 32.3   Smokeless tobacco: Never  Vaping Use   Vaping status: Never Used  Substance and Sexual Activity   Alcohol use: No   Drug use: No   Sexual activity: Not  Currently  Other Topics Concern   Not on file  Social History Narrative   In [place] in senior place; quit smoking [> 25 years ago]; no alcohol; hosiery.    Social Drivers of Corporate investment banker Strain: Low Risk  (06/11/2023)   Received from [facility]   Overall Financial Resource Strain (CARDIA)    Difficulty of  Paying Living Expenses: Not hard at all  Food Insecurity: No Food Insecurity (08/26/2023)   Hunger Vital Sign    Worried About Running Out of Food in the Last Year: Never true    Ran Out of Food in the Last Year: Never true  Transportation Needs: No Transportation Needs (08/25/2023)   PRAPARE - Administrator, Civil Service (Medical): No    Lack of Transportation (Non-Medical): No  Physical Activity: Inactive (12/24/2018)   Exercise Vital Sign    Days of Exercise per Week: 0 days    Minutes of Exercise per Session: 0 min  Stress: No Stress Concern Present (12/21/2017)   [employer] of Occupational Health - Occupational Stress Questionnaire    Feeling of Stress : Not at all  Social Connections: Socially Isolated (08/25/2023)   Social Connection and Isolation Panel [NHANES]    Frequency of Communication with Friends and Family: Once a week    Frequency of Social Gatherings with Friends and Family: Once a week    Attends Religious Services: 1 to 4 times per year    Active Member of [employer] or Organizations: No    Attends Banker Meetings: Never    Marital Status: Widowed  Intimate Partner Violence: Not At Risk (08/25/2023)   Humiliation, Afraid, Rape, and Kick questionnaire    Fear of Current or Ex-Partner: No    Emotionally Abused: No    Physically Abused: No    Sexually Abused: No   FAMILY HISTORY: Family History  Problem Relation Age of Onset   Heart attack Father    Asthma Father    Kidney disease Mother    Hypertension Mother    Hypertension Other    Diabetes Other    Heart attack Other    Breast cancer Neg Hx    ALLERGIES:  is allergic to alendronate.  MEDICATIONS:  Current Outpatient Medications  Medication Sig Dispense Refill   amLODipine  (NORVASC ) 5 MG tablet Take 5 mg by mouth daily.     albuterol  (VENTOLIN  HFA) 108 (90 Base) MCG/ACT inhaler Inhale 2 puffs into the lungs every 6 (six) hours as needed for wheezing or shortness of breath. 8 g 0    aspirin  EC 81 MG tablet Take 81 mg by mouth daily.      Cholecalciferol  (VITAMIN D3) 50 MCG (2000 UT) TABS Take 1 tablet by mouth daily.     Cranberry 500 MG CAPS Take 500 mg by mouth daily. (Patient not taking: Reported on 08/25/2023)     cyanocobalamin  1000 MCG tablet Take 1,000 mcg by mouth daily.     dorzolamide  (TRUSOPT ) 2 % ophthalmic solution Place 1 drop into both eyes 2 (two) times daily.      DULoxetine (CYMBALTA) 30 MG capsule Take 30 mg by mouth daily.     ibandronate  (BONIVA ) 150 MG tablet TAKE ONE TABLET ONCE A MONTH FIRST THING IN THE MORNING AT LEAST 1 HOUR BEFORE EATING, TAKE WITH WATER. 1 tablet 12   latanoprost  (XALATAN ) 0.005 % ophthalmic solution Place 1 drop into both eyes at bedtime.      levothyroxine  (  SYNTHROID ) 100 MCG tablet TAKE 1 TABLET BY MOUTH EVERY DAY (Patient taking differently: Take 100 mcg by mouth daily before breakfast.) 90 tablet 3   loperamide  (IMODIUM ) 2 MG capsule Take 2 mg by mouth as needed for diarrhea or loose stools.     lovastatin (MEVACOR) 40 MG tablet TAKE 1 TABLET BY MOUTH AT BEDTIME 90 tablet 0   magnesium  oxide (MAG-OX) 400 MG tablet Take 400 mg by mouth daily.     Multiple Vitamins-Minerals (PRESERVISION AREDS 2) CAPS Take 1 capsule by mouth 2 (two) times daily.     nortriptyline  (PAMELOR ) 25 MG capsule TAKE 1 CAPSULE(25 MG) BY MOUTH AT BEDTIME 90 capsule 0   omeprazole  (PRILOSEC) 40 MG capsule Take 1 capsule (40 mg total) by mouth daily. 90 capsule 4   oxybutynin (DITROPAN-XL) 5 MG 24 hr tablet Take 5 mg by mouth daily.     Polyethylene Glycol 3350  (DULCOLAX BALANCE PO) Take 2 tablets by mouth as needed.     pregabalin  (LYRICA ) 50 MG capsule Take 50 mg by mouth 2 (two) times daily.     SPIRIVA  RESPIMAT 2.5 MCG/ACT AERS SMARTSIG:2 Puff(s) By Mouth Daily     zinc sulfate 220 (50 Zn) MG capsule Take 220 mg by mouth daily.     No current facility-administered medications for this visit.   PHYSICAL EXAMINATION: Vitals:   11/22/23 1449  BP: (!)  108/48  Pulse: 97  Resp: 18  Temp: (!) 96.1 F (35.6 C)  SpO2: 98%   Filed Weights   11/22/23 1449  Weight: 102 lb (46.3 kg)   Physical Exam Vitals reviewed.  Constitutional:      Appearance: She is not ill-appearing.  HENT:     Head: Normocephalic and atraumatic.     Mouth/Throat:     Pharynx: No oropharyngeal exudate.  Cardiovascular:     Rate and Rhythm: Normal rate and regular rhythm.  Pulmonary:     Effort: No respiratory distress.  Abdominal:     General: There is no distension.     Palpations: Abdomen is soft.  Musculoskeletal:        General: No tenderness.  Skin:    General: Skin is warm.     Coloration: Skin is not pale.  Neurological:     Mental Status: She is alert and oriented to person, place, and time.  Psychiatric:        Mood and Affect: Mood and affect normal.        Behavior: Behavior normal.    LABORATORY DATA:  I have reviewed the data as listed Lab Results  Component Value Date   WBC 5.4 11/22/2023   HGB 10.2 (L) 11/22/2023   HCT 34.0 (L) 11/22/2023   MCV 102.4 (H) 11/22/2023   PLT 242 11/22/2023   Recent Labs    08/29/23 0510 08/30/23 0521 11/22/23 1417  NA 135 136 142  K 5.0 4.5 3.9  CL 103 104 107  CO2 22 24 22   GLUCOSE 98 142* 154*  BUN 32* 33* 28*  CREATININE 1.16* 1.17* 1.23*  CALCIUM  8.5* 8.9 8.6*  GFRNONAA 44* 44* 41*   Iron /TIBC/Ferritin/ %Sat    Component Value Date/Time   IRON  96 07/23/2023 1512   TIBC 231 (L) 07/23/2023 1512   FERRITIN 540 (H) 11/22/2023 1417   FERRITIN 119 01/07/2019 1144   IRONPCTSAT 42 (H) 07/23/2023 1512   No results found.  Assessment & Plan:  Anemia due to stage 3a chronic kidney disease  # Anemia- secondary chronic  kidney disease stage III/iron  deficiency [March 2021 hb 8-9]. Currently on IV iron  infusion/Retacrit , now epogen .    # Today hb-10.2. Reviewed rationale for EPO and use lifelong. 07/2023 Ferritin 396, Iron  sat 42. Proceed with retacrit  today. Hold for hemoglobin greater  than 11. Continue oral iron /gentle iron . Can decrease oral iron  to 3 times a week.   # CKD- GFR 41. Stage 3b. Stable. Follow up with nephrology. Avoid nephrotoxic substances.    # back pain s/p prednisone ; on lyrica - as per PCP.    DISPOSITION:  Epogen  today 4 week- lab (H&H), +/- epogen  8 weeks- lab (H&H), +/- epogen  12 weeks- lab (H&H), +/- epogen  16 weeks- lab (cbc, bmp, ferritin, iron  studies), Dr Valentine Gasmen, +/- venofer  OR epogen - [place]  No problem-specific Assessment & Plan notes found for this encounter.  All questions were answered. The patient knows to call the clinic with any problems, questions or concerns.   Nelda Balsam, NP 11/22/2023

## 2023-12-01 DIAGNOSIS — J449 Chronic obstructive pulmonary disease, unspecified: Secondary | ICD-10-CM | POA: Diagnosis not present

## 2023-12-04 DIAGNOSIS — N183 Chronic kidney disease, stage 3 unspecified: Secondary | ICD-10-CM | POA: Diagnosis not present

## 2023-12-04 DIAGNOSIS — I10 Essential (primary) hypertension: Secondary | ICD-10-CM | POA: Diagnosis not present

## 2023-12-04 DIAGNOSIS — J449 Chronic obstructive pulmonary disease, unspecified: Secondary | ICD-10-CM | POA: Diagnosis not present

## 2023-12-04 DIAGNOSIS — E039 Hypothyroidism, unspecified: Secondary | ICD-10-CM | POA: Diagnosis not present

## 2023-12-04 DIAGNOSIS — E1122 Type 2 diabetes mellitus with diabetic chronic kidney disease: Secondary | ICD-10-CM | POA: Diagnosis not present

## 2023-12-05 DIAGNOSIS — J449 Chronic obstructive pulmonary disease, unspecified: Secondary | ICD-10-CM | POA: Diagnosis not present

## 2023-12-05 DIAGNOSIS — E1122 Type 2 diabetes mellitus with diabetic chronic kidney disease: Secondary | ICD-10-CM | POA: Diagnosis not present

## 2023-12-05 DIAGNOSIS — M8008XD Age-related osteoporosis with current pathological fracture, vertebra(e), subsequent encounter for fracture with routine healing: Secondary | ICD-10-CM | POA: Diagnosis not present

## 2023-12-05 DIAGNOSIS — E1140 Type 2 diabetes mellitus with diabetic neuropathy, unspecified: Secondary | ICD-10-CM | POA: Diagnosis not present

## 2023-12-05 DIAGNOSIS — I129 Hypertensive chronic kidney disease with stage 1 through stage 4 chronic kidney disease, or unspecified chronic kidney disease: Secondary | ICD-10-CM | POA: Diagnosis not present

## 2023-12-05 DIAGNOSIS — N179 Acute kidney failure, unspecified: Secondary | ICD-10-CM | POA: Diagnosis not present

## 2023-12-05 DIAGNOSIS — E1136 Type 2 diabetes mellitus with diabetic cataract: Secondary | ICD-10-CM | POA: Diagnosis not present

## 2023-12-13 DIAGNOSIS — E1122 Type 2 diabetes mellitus with diabetic chronic kidney disease: Secondary | ICD-10-CM | POA: Diagnosis not present

## 2023-12-13 DIAGNOSIS — J441 Chronic obstructive pulmonary disease with (acute) exacerbation: Secondary | ICD-10-CM | POA: Diagnosis not present

## 2023-12-13 DIAGNOSIS — N183 Chronic kidney disease, stage 3 unspecified: Secondary | ICD-10-CM | POA: Diagnosis not present

## 2023-12-13 DIAGNOSIS — M48062 Spinal stenosis, lumbar region with neurogenic claudication: Secondary | ICD-10-CM | POA: Diagnosis not present

## 2023-12-13 DIAGNOSIS — I10 Essential (primary) hypertension: Secondary | ICD-10-CM | POA: Diagnosis not present

## 2023-12-13 DIAGNOSIS — E039 Hypothyroidism, unspecified: Secondary | ICD-10-CM | POA: Diagnosis not present

## 2023-12-13 DIAGNOSIS — E7800 Pure hypercholesterolemia, unspecified: Secondary | ICD-10-CM | POA: Diagnosis not present

## 2023-12-19 ENCOUNTER — Other Ambulatory Visit: Payer: Self-pay | Admitting: *Deleted

## 2023-12-19 DIAGNOSIS — D631 Anemia in chronic kidney disease: Secondary | ICD-10-CM

## 2023-12-20 ENCOUNTER — Inpatient Hospital Stay: Attending: Internal Medicine

## 2023-12-20 ENCOUNTER — Inpatient Hospital Stay

## 2023-12-20 VITALS — BP 175/66

## 2023-12-20 DIAGNOSIS — N1832 Chronic kidney disease, stage 3b: Secondary | ICD-10-CM | POA: Insufficient documentation

## 2023-12-20 DIAGNOSIS — D631 Anemia in chronic kidney disease: Secondary | ICD-10-CM | POA: Diagnosis not present

## 2023-12-20 LAB — BASIC METABOLIC PANEL - CANCER CENTER ONLY
Anion gap: 7 (ref 5–15)
BUN: 20 mg/dL (ref 8–23)
CO2: 28 mmol/L (ref 22–32)
Calcium: 8.5 mg/dL — ABNORMAL LOW (ref 8.9–10.3)
Chloride: 107 mmol/L (ref 98–111)
Creatinine: 0.82 mg/dL (ref 0.44–1.00)
GFR, Estimated: >60 mL/min (ref >60)
Glucose, Bld: 124 mg/dL — ABNORMAL HIGH (ref 70–99)
Potassium: 4.8 mmol/L (ref 3.5–5.1)
Sodium: 142 mmol/L (ref 135–145)

## 2023-12-20 LAB — CBC WITH DIFFERENTIAL (CANCER CENTER ONLY)
Abs Immature Granulocytes: 0.03 10*3/uL (ref 0.00–0.07)
Basophils Absolute: 0 10*3/uL (ref 0.0–0.1)
Basophils Relative: 0 %
Eosinophils Absolute: 0 10*3/uL (ref 0.0–0.5)
Eosinophils Relative: 1 %
HCT: 33.5 % — ABNORMAL LOW (ref 36.0–46.0)
Hemoglobin: 9.8 g/dL — ABNORMAL LOW (ref 12.0–15.0)
Immature Granulocytes: 0 %
Lymphocytes Relative: 23 %
Lymphs Abs: 1.6 10*3/uL (ref 0.7–4.0)
MCH: 30.1 pg (ref 26.0–34.0)
MCHC: 29.3 g/dL — ABNORMAL LOW (ref 30.0–36.0)
MCV: 102.8 fL — ABNORMAL HIGH (ref 80.0–100.0)
Monocytes Absolute: 0.6 10*3/uL (ref 0.1–1.0)
Monocytes Relative: 9 %
Neutro Abs: 4.7 10*3/uL (ref 1.7–7.7)
Neutrophils Relative %: 67 %
Platelet Count: 255 10*3/uL (ref 150–400)
RBC: 3.26 MIL/uL — ABNORMAL LOW (ref 3.87–5.11)
RDW: 13.3 % (ref 11.5–15.5)
WBC Count: 7 10*3/uL (ref 4.0–10.5)
nRBC: 0 % (ref 0.0–0.2)

## 2023-12-20 LAB — IRON AND TIBC
Iron: 68 ug/dL (ref 28–170)
Saturation Ratios: 29 % (ref 10.4–31.8)
TIBC: 232 ug/dL — ABNORMAL LOW (ref 250–450)
UIBC: 164 ug/dL

## 2023-12-20 LAB — FERRITIN: Ferritin: 452 ng/mL — ABNORMAL HIGH (ref 11–307)

## 2023-12-20 MED ORDER — EPOETIN ALFA 20000 UNIT/ML IJ SOLN
20000.0000 [IU] | Freq: Once | INTRAMUSCULAR | Status: AC
  Administered 2023-12-20: 20000 [IU] via SUBCUTANEOUS
  Filled 2023-12-20: qty 1

## 2023-12-24 ENCOUNTER — Inpatient Hospital Stay: Payer: PPO

## 2023-12-25 DIAGNOSIS — M546 Pain in thoracic spine: Secondary | ICD-10-CM | POA: Diagnosis not present

## 2023-12-25 DIAGNOSIS — G8929 Other chronic pain: Secondary | ICD-10-CM | POA: Diagnosis not present

## 2023-12-25 DIAGNOSIS — M5441 Lumbago with sciatica, right side: Secondary | ICD-10-CM | POA: Diagnosis not present

## 2023-12-25 DIAGNOSIS — M5416 Radiculopathy, lumbar region: Secondary | ICD-10-CM | POA: Diagnosis not present

## 2023-12-31 DIAGNOSIS — M5416 Radiculopathy, lumbar region: Secondary | ICD-10-CM | POA: Diagnosis not present

## 2023-12-31 DIAGNOSIS — N183 Chronic kidney disease, stage 3 unspecified: Secondary | ICD-10-CM | POA: Diagnosis not present

## 2023-12-31 DIAGNOSIS — E1122 Type 2 diabetes mellitus with diabetic chronic kidney disease: Secondary | ICD-10-CM | POA: Diagnosis not present

## 2024-01-15 DIAGNOSIS — G8929 Other chronic pain: Secondary | ICD-10-CM | POA: Diagnosis not present

## 2024-01-15 DIAGNOSIS — M546 Pain in thoracic spine: Secondary | ICD-10-CM | POA: Diagnosis not present

## 2024-01-15 DIAGNOSIS — M5414 Radiculopathy, thoracic region: Secondary | ICD-10-CM | POA: Diagnosis not present

## 2024-01-15 DIAGNOSIS — M5416 Radiculopathy, lumbar region: Secondary | ICD-10-CM | POA: Diagnosis not present

## 2024-01-15 DIAGNOSIS — M5441 Lumbago with sciatica, right side: Secondary | ICD-10-CM | POA: Diagnosis not present

## 2024-01-17 ENCOUNTER — Inpatient Hospital Stay

## 2024-01-17 ENCOUNTER — Inpatient Hospital Stay: Attending: Internal Medicine

## 2024-01-17 VITALS — BP 182/86

## 2024-01-17 DIAGNOSIS — D631 Anemia in chronic kidney disease: Secondary | ICD-10-CM | POA: Insufficient documentation

## 2024-01-17 DIAGNOSIS — N1832 Chronic kidney disease, stage 3b: Secondary | ICD-10-CM | POA: Insufficient documentation

## 2024-01-17 LAB — CBC WITH DIFFERENTIAL (CANCER CENTER ONLY)
Abs Immature Granulocytes: 0.01 10*3/uL (ref 0.00–0.07)
Basophils Absolute: 0 10*3/uL (ref 0.0–0.1)
Basophils Relative: 0 %
Eosinophils Absolute: 0.1 10*3/uL (ref 0.0–0.5)
Eosinophils Relative: 1 %
HCT: 32.3 % — ABNORMAL LOW (ref 36.0–46.0)
Hemoglobin: 9.6 g/dL — ABNORMAL LOW (ref 12.0–15.0)
Immature Granulocytes: 0 %
Lymphocytes Relative: 30 %
Lymphs Abs: 1.8 10*3/uL (ref 0.7–4.0)
MCH: 30.1 pg (ref 26.0–34.0)
MCHC: 29.7 g/dL — ABNORMAL LOW (ref 30.0–36.0)
MCV: 101.3 fL — ABNORMAL HIGH (ref 80.0–100.0)
Monocytes Absolute: 0.7 10*3/uL (ref 0.1–1.0)
Monocytes Relative: 11 %
Neutro Abs: 3.5 10*3/uL (ref 1.7–7.7)
Neutrophils Relative %: 58 %
Platelet Count: 293 10*3/uL (ref 150–400)
RBC: 3.19 MIL/uL — ABNORMAL LOW (ref 3.87–5.11)
RDW: 13.3 % (ref 11.5–15.5)
WBC Count: 6.1 10*3/uL (ref 4.0–10.5)
nRBC: 0 % (ref 0.0–0.2)

## 2024-01-17 MED ORDER — EPOETIN ALFA 20000 UNIT/ML IJ SOLN
20000.0000 [IU] | Freq: Once | INTRAMUSCULAR | Status: AC
  Administered 2024-01-17: 20000 [IU] via SUBCUTANEOUS
  Filled 2024-01-17: qty 1

## 2024-01-28 DIAGNOSIS — M546 Pain in thoracic spine: Secondary | ICD-10-CM | POA: Diagnosis not present

## 2024-02-14 ENCOUNTER — Inpatient Hospital Stay

## 2024-03-10 ENCOUNTER — Other Ambulatory Visit: Payer: Self-pay | Admitting: *Deleted

## 2024-03-10 DIAGNOSIS — D631 Anemia in chronic kidney disease: Secondary | ICD-10-CM

## 2024-03-11 ENCOUNTER — Inpatient Hospital Stay (HOSPITAL_BASED_OUTPATIENT_CLINIC_OR_DEPARTMENT_OTHER): Admitting: Internal Medicine

## 2024-03-11 ENCOUNTER — Inpatient Hospital Stay: Attending: Internal Medicine

## 2024-03-11 ENCOUNTER — Inpatient Hospital Stay

## 2024-03-11 ENCOUNTER — Encounter: Payer: Self-pay | Admitting: Internal Medicine

## 2024-03-11 VITALS — BP 135/70 | HR 75 | Temp 98.6°F | Resp 19 | Ht 63.0 in | Wt 101.7 lb

## 2024-03-11 DIAGNOSIS — N1832 Chronic kidney disease, stage 3b: Secondary | ICD-10-CM

## 2024-03-11 DIAGNOSIS — D631 Anemia in chronic kidney disease: Secondary | ICD-10-CM | POA: Insufficient documentation

## 2024-03-11 LAB — BASIC METABOLIC PANEL - CANCER CENTER ONLY
Anion gap: 8 (ref 5–15)
BUN: 21 mg/dL (ref 8–23)
CO2: 28 mmol/L (ref 22–32)
Calcium: 9.1 mg/dL (ref 8.9–10.3)
Chloride: 104 mmol/L (ref 98–111)
Creatinine: 0.91 mg/dL (ref 0.44–1.00)
GFR, Estimated: 59 mL/min — ABNORMAL LOW (ref >60)
Glucose, Bld: 144 mg/dL — ABNORMAL HIGH (ref 70–99)
Potassium: 4.8 mmol/L (ref 3.5–5.1)
Sodium: 140 mmol/L (ref 135–145)

## 2024-03-11 LAB — CBC WITH DIFFERENTIAL (CANCER CENTER ONLY)
Abs Immature Granulocytes: 0.02 K/uL (ref 0.00–0.07)
Basophils Absolute: 0 K/uL (ref 0.0–0.1)
Basophils Relative: 0 %
Eosinophils Absolute: 0.1 K/uL (ref 0.0–0.5)
Eosinophils Relative: 2 %
HCT: 31.9 % — ABNORMAL LOW (ref 36.0–46.0)
Hemoglobin: 9.6 g/dL — ABNORMAL LOW (ref 12.0–15.0)
Immature Granulocytes: 0 %
Lymphocytes Relative: 22 %
Lymphs Abs: 1.6 K/uL (ref 0.7–4.0)
MCH: 30.3 pg (ref 26.0–34.0)
MCHC: 30.1 g/dL (ref 30.0–36.0)
MCV: 100.6 fL — ABNORMAL HIGH (ref 80.0–100.0)
Monocytes Absolute: 0.8 K/uL (ref 0.1–1.0)
Monocytes Relative: 11 %
Neutro Abs: 4.6 K/uL (ref 1.7–7.7)
Neutrophils Relative %: 65 %
Platelet Count: 240 K/uL (ref 150–400)
RBC: 3.17 MIL/uL — ABNORMAL LOW (ref 3.87–5.11)
RDW: 13.2 % (ref 11.5–15.5)
WBC Count: 7.1 K/uL (ref 4.0–10.5)
nRBC: 0 % (ref 0.0–0.2)

## 2024-03-11 LAB — IRON AND TIBC
Iron: 59 ug/dL (ref 28–170)
Saturation Ratios: 25 % (ref 10.4–31.8)
TIBC: 237 ug/dL — ABNORMAL LOW (ref 250–450)
UIBC: 178 ug/dL

## 2024-03-11 LAB — FERRITIN: Ferritin: 418 ng/mL — ABNORMAL HIGH (ref 11–307)

## 2024-03-11 MED ORDER — EPOETIN ALFA 20000 UNIT/ML IJ SOLN
20000.0000 [IU] | Freq: Once | INTRAMUSCULAR | Status: AC
  Administered 2024-03-11: 20000 [IU] via SUBCUTANEOUS
  Filled 2024-03-11: qty 1

## 2024-03-11 NOTE — Progress Notes (Signed)
 [facility] CONSULT NOTE  Patient Care Team: Lenon Layman ORN, MD as PCP - General (Internal Medicine) Dingeldein, Elspeth, MD as Consulting Physician (Ophthalmology) Rennie Cindy SAUNDERS, MD as Consulting Physician (Oncology)  CHIEF COMPLAINTS/PURPOSE OF CONSULTATION: Anemia   HEMATOLOGY HISTORY  #Anemia CKD-III [name] 2021- Hb 8-9;[name];  Ferritin-100; PCP] [EGD/; colonoscopy-[facility];2016 ; capsule-]; iron  saturation 20% ferritin-68; no hemolysis; on retacrit  q monthly.   # CKD-III [GFR40s]  # COPD   HISTORY OF PRESENTING ILLNESS: Ambulating independently.  Accompanied by daughter  Robyn Butler 89 y.o.  female anemia/chronic kidney disease is here for follow-up.  Patient complains of chronic mild fatigue.  Not any worse.  No nausea no vomiting no blood in stools no black-colored stools.  Chronic joint pains.   Review of Systems  Constitutional:  Positive for malaise/fatigue. Negative for chills, diaphoresis, fever and weight loss.  HENT:  Negative for nosebleeds and sore throat.   Eyes:  Negative for double vision.  Respiratory:  Negative for hemoptysis, sputum production and wheezing.   Cardiovascular:  Negative for chest pain, palpitations, orthopnea and leg swelling.  Gastrointestinal:  Negative for abdominal pain, blood in stool, constipation, diarrhea, heartburn, melena, nausea and vomiting.  Genitourinary:  Negative for dysuria, frequency and urgency.  Musculoskeletal:  Positive for back pain and joint pain.  Skin: Negative.  Negative for itching and rash.  Neurological:  Negative for dizziness, tingling, focal weakness, weakness and headaches.  Endo/Heme/Allergies:  Does not bruise/bleed easily.  Psychiatric/Behavioral:  Negative for depression. The patient is not nervous/anxious and does not have insomnia.     MEDICAL HISTORY:  Past Medical History:  Diagnosis Date   Arthritis    Cancer (HCC)    Chronic kidney disease    COPD  (chronic obstructive pulmonary disease) (HCC)    Diabetes mellitus without complication (HCC)    type 2   Hypertension    Varicose veins of lower extremities with other complications     SURGICAL HISTORY: Past Surgical History:  Procedure Laterality Date   ABDOMINAL HYSTERECTOMY  1972   BLADDER SUSPENSION     BREAST EXCISIONAL BIOPSY Right 1999   neg   CHOLECYSTECTOMY  1975   COLONOSCOPY     COLONOSCOPY WITH PROPOFOL  N/A 05/24/2015   Procedure: COLONOSCOPY WITH PROPOFOL ;  Surgeon: Deward CINDERELLA Piedmont, MD;  Location: [facility] ENDOSCOPY;  Service: Gastroenterology;  Laterality: N/A;   CYSTOSCOPY  1972   ESOPHAGOGASTRODUODENOSCOPY N/A 05/24/2015   Procedure: ESOPHAGOGASTRODUODENOSCOPY (EGD);  Surgeon: Deward CINDERELLA Piedmont, MD;  Location[facility]COPY;  Service: Gastroenterology;  Laterality: N/A;   FRACTURE SURGERY     IR KYPHO THORACIC WITH BONE BIOPSY  08/28/2023   JOINT REPLACEMENT Right 2013   hip   salpingo oophorectmy      THYROIDECTOMY  1969   WRIST FRACTURE SURGERY Left     SOCIAL HISTORY: Social History   Socioeconomic History   Marital status: Widowed    Spouse name: Not on file   Number of children: 2   Years of education: 9th Grade   Highest education level: 9th grade  Occupational History   Occupation: Retired  Tobacco Use   Smoking status: Former    Current packs/day: 0.00    Types: Cigarettes    Quit date: 07/18/1991    Years since quitting: 32.6   Smokeless tobacco: Never  Vaping Use   Vaping status: Never Used  Substance and Sexual Activity   Alcohol use: No   Drug use: No   Sexual activity:  Not Currently  Other Topics Concern   Not on file  Social History Narrative   In [place] in senior place; quit smoking [> 25 years ago]; no alcohol; hosiery.    Social Drivers of Health   Financial Resource Strain: Patient Declined (12/25/2023)   Received from [facility]   Overall Financial Resource Strain (CARDIA)    Difficulty of Paying Living Expenses:  Patient declined  Food Insecurity: Patient Declined (12/25/2023)   Received from [facility]   Hunger Vital Sign    Within the past 12 months, you worried that your food would run out before you got the money to buy more.: Patient declined    Within the past 12 months, the food you bought just didn't last and you didn't have money to get more.: Patient declined  Transportation Needs: Unknown (12/25/2023)   Received from [facility] - Transportation    In the past 12 months, has lack of transportation kept you from medical appointments or from getting medications?: No    Lack of Transportation (Non-Medical): Patient declined  Physical Activity: Inactive (12/24/2018)   Exercise Vital Sign    Days of Exercise per Week: 0 days    Minutes of Exercise per Session: 0 min  Stress: No Stress Concern Present (12/21/2017)   [employer] of Occupational Health - Occupational Stress Questionnaire    Feeling of Stress : Not at all  Social Connections: Socially Isolated (08/25/2023)   Social Connection and Isolation Panel    Frequency of Communication with Friends and Family: Once a week    Frequency of Social Gatherings with Friends and Family: Once a week    Attends Religious Services: 1 to 4 times per year    Active Member of [employer] or Organizations: No    Attends Banker Meetings: Never    Marital Status: Widowed  Intimate Partner Violence: Not At Risk (08/25/2023)   Humiliation, Afraid, Rape, and Kick questionnaire    Fear of Current or Ex-Partner: No    Emotionally Abused: No    Physically Abused: No    Sexually Abused: No    FAMILY HISTORY: Family History  Problem Relation Age of Onset   Heart attack Father    Asthma Father    Kidney disease Mother    Hypertension Mother    Hypertension Other    Diabetes Other    Heart attack Other    Breast cancer Neg Hx     ALLERGIES:  is allergic to alendronate.  MEDICATIONS:  Current  Outpatient Medications  Medication Sig Dispense Refill   albuterol  (VENTOLIN  HFA) 108 (90 Base) MCG/ACT inhaler Inhale 2 puffs into the lungs every 6 (six) hours as needed for wheezing or shortness of breath. 8 g 0   amLODipine  (NORVASC ) 5 MG tablet Take 5 mg by mouth daily.     aspirin  EC 81 MG tablet Take 81 mg by mouth daily.      Cholecalciferol  (VITAMIN D3) 50 MCG (2000 UT) TABS Take 1 tablet by mouth daily.     cyanocobalamin  1000 MCG tablet Take 1,000 mcg by mouth daily.     diphenhydramine-acetaminophen  (TYLENOL  PM) 25-500 MG TABS tablet Take 1 tablet by mouth at bedtime as needed.     dorzolamide  (TRUSOPT ) 2 % ophthalmic solution Place 1 drop into both eyes 2 (two) times daily.      DULoxetine (CYMBALTA) 30 MG capsule Take 30 mg by mouth daily.  latanoprost  (XALATAN ) 0.005 % ophthalmic solution Place 1 drop into both eyes at bedtime.      levothyroxine  (SYNTHROID ) 100 MCG tablet TAKE 1 TABLET BY MOUTH EVERY DAY 90 tablet 3   loperamide  (IMODIUM ) 2 MG capsule Take 2 mg by mouth as needed for diarrhea or loose stools.     lovastatin (MEVACOR) 40 MG tablet TAKE 1 TABLET BY MOUTH AT BEDTIME 90 tablet 0   magnesium  oxide (MAG-OX) 400 MG tablet Take 400 mg by mouth daily.     Multiple Vitamins-Minerals (PRESERVISION AREDS 2) CAPS Take 1 capsule by mouth 2 (two) times daily.     nortriptyline  (PAMELOR ) 25 MG capsule TAKE 1 CAPSULE(25 MG) BY MOUTH AT BEDTIME 90 capsule 0   omeprazole  (PRILOSEC) 40 MG capsule Take 1 capsule (40 mg total) by mouth daily. 90 capsule 4   oxybutynin (DITROPAN-XL) 5 MG 24 hr tablet Take 5 mg by mouth daily.     Polyethylene Glycol 3350  (DULCOLAX BALANCE PO) Take 2 tablets by mouth as needed.     pregabalin  (LYRICA ) 50 MG capsule Take 50 mg by mouth 2 (two) times daily.     SPIRIVA  RESPIMAT 2.5 MCG/ACT AERS SMARTSIG:2 Puff(s) By Mouth Daily     valsartan -hydrochlorothiazide  (DIOVAN -HCT) 160-25 MG tablet Take 1 tablet by mouth daily.     zinc sulfate 220 (50 Zn)  MG capsule Take 220 mg by mouth daily.     Cranberry 500 MG CAPS Take 500 mg by mouth daily. (Patient not taking: Reported on 03/11/2024)     ibandronate  (BONIVA ) 150 MG tablet TAKE ONE TABLET ONCE A MONTH FIRST THING IN THE MORNING AT LEAST 1 HOUR BEFORE EATING, TAKE WITH WATER. (Patient not taking: Reported on 03/11/2024) 1 tablet 12   No current facility-administered medications for this visit.      PHYSICAL EXAMINATION:   Vitals:   03/11/24 1507 03/11/24 1513  BP: (!) 194/74 135/70  Pulse:    Resp:    Temp:    SpO2:      Filed Weights   03/11/24 1414  Weight: 101 lb 11.2 oz (46.1 kg)     Physical Exam HENT:     Head: Normocephalic and atraumatic.     Mouth/Throat:     Pharynx: No oropharyngeal exudate.  Eyes:     Pupils: Pupils are equal, round, and reactive to light.  Cardiovascular:     Rate and Rhythm: Normal rate and regular rhythm.  Pulmonary:     Effort: No respiratory distress.     Breath sounds: No wheezing.  Abdominal:     General: Bowel sounds are normal. There is no distension.     Palpations: Abdomen is soft. There is no mass.     Tenderness: There is no abdominal tenderness. There is no guarding or rebound.  Musculoskeletal:        General: No tenderness. Normal range of motion.     Cervical back: Normal range of motion and neck supple.  Skin:    General: Skin is warm.  Neurological:     Mental Status: She is alert and oriented to person, place, and time.  Psychiatric:        Mood and Affect: Affect normal.     LABORATORY DATA:  I have reviewed the data as listed Lab Results  Component Value Date   WBC 7.1 03/11/2024   HGB 9.6 (L) 03/11/2024   HCT 31.9 (L) 03/11/2024   MCV 100.6 (H) 03/11/2024   PLT 240 03/11/2024  Recent Labs    11/22/23 1417 12/20/23 1520 03/11/24 1413  NA 142 142 140  K 3.9 4.8 4.8  CL 107 107 104  CO2 22 28 28   GLUCOSE 154* 124* 144*  BUN 28* 20 21  CREATININE 1.23* 0.82 0.91  CALCIUM  8.6* 8.5* 9.1   GFRNONAA 41* >60 59*     No results found.  Anemia due to stage 3a chronic kidney disease (HCC) #Anemia-secondary chronic kidney disease stage III/iron  deficiency [March 2021-hb-8-9].  Currently on IV iron  infusion/Retacrit .stable.   # Today hb-9.7- on  retacrit /venofer ; again reviwed the need for ongoing retacrit  for the rest of her life.  APRIL 2024- Iron  sat-31 &; ferritin-272- continue PO iron  pills- gentle iron . Proceed with Retacrit . HOLD venofer   # Elevated blood pressure-repeat 150s systolic-recommend monitoring blood pressure at home bringing the log next visit  DISPOSITION:  # repeat BP #  proceed with Retacrit  today;  HOLD  IV venofer  today # monthly cbc/possible retacrit  x4 # Follow up in 4 months-- MD ;labs-cbc/bmp;iron -studies/ferritin possible SQ retacrit  OR venofer - Dr.B  All questions were answered. The patient knows to call the clinic with any problems, questions or concerns.    Cindy JONELLE Joe, MD 03/11/2024 3:43 PM

## 2024-03-11 NOTE — Assessment & Plan Note (Addendum)
#  Anemia-secondary chronic kidney disease stage III/iron  deficiency [March 2021-hb-8-9].  Currently on IV iron  infusion/Retacrit .stable.   # Today hb-9.7- on  retacrit /venofer ; again reviwed the need for ongoing retacrit  for the rest of her life.  APRIL 2024- Iron  sat-31 &; ferritin-272- continue PO iron  pills- gentle iron . Proceed with Retacrit . HOLD venofer   # Elevated blood pressure-repeat 150s systolic-recommend monitoring blood pressure at home bringing the log next visit  DISPOSITION:  # repeat BP #  proceed with Retacrit  today;  HOLD  IV venofer  today # monthly cbc/possible retacrit  x4 # Follow up in 4 months-- MD ;labs-cbc/bmp;iron -studies/ferritin possible SQ retacrit  OR venofer - Dr.B

## 2024-03-11 NOTE — Progress Notes (Signed)
 Patient has no concerns. Patient states she is having some swelling in her ankles and feet.

## 2024-03-24 ENCOUNTER — Emergency Department

## 2024-03-24 ENCOUNTER — Other Ambulatory Visit: Payer: Self-pay

## 2024-03-24 ENCOUNTER — Inpatient Hospital Stay
Admission: EM | Admit: 2024-03-24 | Discharge: 2024-04-16 | DRG: 481 | Disposition: E | Attending: Internal Medicine | Admitting: Internal Medicine

## 2024-03-24 DIAGNOSIS — E890 Postprocedural hypothyroidism: Secondary | ICD-10-CM | POA: Diagnosis present

## 2024-03-24 DIAGNOSIS — Y92009 Unspecified place in unspecified non-institutional (private) residence as the place of occurrence of the external cause: Secondary | ICD-10-CM

## 2024-03-24 DIAGNOSIS — I6782 Cerebral ischemia: Secondary | ICD-10-CM | POA: Diagnosis not present

## 2024-03-24 DIAGNOSIS — S0101XA Laceration without foreign body of scalp, initial encounter: Secondary | ICD-10-CM | POA: Diagnosis not present

## 2024-03-24 DIAGNOSIS — Z7982 Long term (current) use of aspirin: Secondary | ICD-10-CM

## 2024-03-24 DIAGNOSIS — Z888 Allergy status to other drugs, medicaments and biological substances status: Secondary | ICD-10-CM

## 2024-03-24 DIAGNOSIS — S72009A Fracture of unspecified part of neck of unspecified femur, initial encounter for closed fracture: Secondary | ICD-10-CM | POA: Diagnosis present

## 2024-03-24 DIAGNOSIS — Z841 Family history of disorders of kidney and ureter: Secondary | ICD-10-CM

## 2024-03-24 DIAGNOSIS — W19XXXA Unspecified fall, initial encounter: Secondary | ICD-10-CM | POA: Diagnosis not present

## 2024-03-24 DIAGNOSIS — D72829 Elevated white blood cell count, unspecified: Secondary | ICD-10-CM | POA: Diagnosis present

## 2024-03-24 DIAGNOSIS — Z8249 Family history of ischemic heart disease and other diseases of the circulatory system: Secondary | ICD-10-CM

## 2024-03-24 DIAGNOSIS — N1831 Chronic kidney disease, stage 3a: Secondary | ICD-10-CM | POA: Diagnosis present

## 2024-03-24 DIAGNOSIS — Z01810 Encounter for preprocedural cardiovascular examination: Secondary | ICD-10-CM | POA: Diagnosis not present

## 2024-03-24 DIAGNOSIS — Z825 Family history of asthma and other chronic lower respiratory diseases: Secondary | ICD-10-CM

## 2024-03-24 DIAGNOSIS — Z96641 Presence of right artificial hip joint: Secondary | ICD-10-CM | POA: Diagnosis not present

## 2024-03-24 DIAGNOSIS — Z87891 Personal history of nicotine dependence: Secondary | ICD-10-CM

## 2024-03-24 DIAGNOSIS — E440 Moderate protein-calorie malnutrition: Secondary | ICD-10-CM | POA: Diagnosis present

## 2024-03-24 DIAGNOSIS — Z723 Lack of physical exercise: Secondary | ICD-10-CM

## 2024-03-24 DIAGNOSIS — Z043 Encounter for examination and observation following other accident: Secondary | ICD-10-CM | POA: Diagnosis not present

## 2024-03-24 DIAGNOSIS — W010XXA Fall on same level from slipping, tripping and stumbling without subsequent striking against object, initial encounter: Secondary | ICD-10-CM | POA: Diagnosis present

## 2024-03-24 DIAGNOSIS — Z79899 Other long term (current) drug therapy: Secondary | ICD-10-CM

## 2024-03-24 DIAGNOSIS — S72002A Fracture of unspecified part of neck of left femur, initial encounter for closed fracture: Secondary | ICD-10-CM | POA: Diagnosis present

## 2024-03-24 DIAGNOSIS — I10 Essential (primary) hypertension: Secondary | ICD-10-CM | POA: Diagnosis not present

## 2024-03-24 DIAGNOSIS — I129 Hypertensive chronic kidney disease with stage 1 through stage 4 chronic kidney disease, or unspecified chronic kidney disease: Secondary | ICD-10-CM | POA: Diagnosis present

## 2024-03-24 DIAGNOSIS — S0990XA Unspecified injury of head, initial encounter: Secondary | ICD-10-CM | POA: Diagnosis not present

## 2024-03-24 DIAGNOSIS — E1122 Type 2 diabetes mellitus with diabetic chronic kidney disease: Secondary | ICD-10-CM | POA: Diagnosis present

## 2024-03-24 DIAGNOSIS — I517 Cardiomegaly: Secondary | ICD-10-CM | POA: Diagnosis not present

## 2024-03-24 DIAGNOSIS — Z9049 Acquired absence of other specified parts of digestive tract: Secondary | ICD-10-CM

## 2024-03-24 DIAGNOSIS — M810 Age-related osteoporosis without current pathological fracture: Secondary | ICD-10-CM | POA: Diagnosis present

## 2024-03-24 DIAGNOSIS — Z833 Family history of diabetes mellitus: Secondary | ICD-10-CM

## 2024-03-24 DIAGNOSIS — K219 Gastro-esophageal reflux disease without esophagitis: Secondary | ICD-10-CM | POA: Diagnosis present

## 2024-03-24 DIAGNOSIS — M79659 Pain in unspecified thigh: Secondary | ICD-10-CM | POA: Diagnosis not present

## 2024-03-24 DIAGNOSIS — Z602 Problems related to living alone: Secondary | ICD-10-CM | POA: Diagnosis present

## 2024-03-24 DIAGNOSIS — J449 Chronic obstructive pulmonary disease, unspecified: Secondary | ICD-10-CM | POA: Diagnosis present

## 2024-03-24 DIAGNOSIS — Z23 Encounter for immunization: Secondary | ICD-10-CM | POA: Diagnosis not present

## 2024-03-24 DIAGNOSIS — S0003XA Contusion of scalp, initial encounter: Secondary | ICD-10-CM | POA: Diagnosis not present

## 2024-03-24 DIAGNOSIS — I469 Cardiac arrest, cause unspecified: Secondary | ICD-10-CM | POA: Diagnosis not present

## 2024-03-24 DIAGNOSIS — G319 Degenerative disease of nervous system, unspecified: Secondary | ICD-10-CM | POA: Diagnosis not present

## 2024-03-24 DIAGNOSIS — Z555 Less than a high school diploma: Secondary | ICD-10-CM

## 2024-03-24 DIAGNOSIS — Z90710 Acquired absence of both cervix and uterus: Secondary | ICD-10-CM

## 2024-03-24 DIAGNOSIS — Z604 Social exclusion and rejection: Secondary | ICD-10-CM | POA: Diagnosis present

## 2024-03-24 DIAGNOSIS — F32A Depression, unspecified: Secondary | ICD-10-CM | POA: Diagnosis present

## 2024-03-24 DIAGNOSIS — Z66 Do not resuscitate: Secondary | ICD-10-CM | POA: Diagnosis present

## 2024-03-24 DIAGNOSIS — S72142A Displaced intertrochanteric fracture of left femur, initial encounter for closed fracture: Principal | ICD-10-CM | POA: Diagnosis present

## 2024-03-24 DIAGNOSIS — I700 Atherosclerosis of aorta: Secondary | ICD-10-CM | POA: Diagnosis not present

## 2024-03-24 DIAGNOSIS — Z79890 Hormone replacement therapy (postmenopausal): Secondary | ICD-10-CM

## 2024-03-24 DIAGNOSIS — E785 Hyperlipidemia, unspecified: Secondary | ICD-10-CM | POA: Diagnosis present

## 2024-03-24 DIAGNOSIS — F419 Anxiety disorder, unspecified: Secondary | ICD-10-CM | POA: Diagnosis present

## 2024-03-24 DIAGNOSIS — Z681 Body mass index (BMI) 19 or less, adult: Secondary | ICD-10-CM

## 2024-03-24 NOTE — ED Triage Notes (Signed)
 Pt to ED via EMS from home, pt reports she fell tonight injurign her left hip/femur, pt also has laceration to side of left head, wrapped and bleeding controlled.

## 2024-03-24 NOTE — ED Provider Notes (Incomplete)
 [facility] Provider Note    Event Date/Time   First MD Initiated Contact with Patient 03/24/24 2346     (approximate)   History   Chief Complaint Fall   HPI  Robyn Butler is a 89 y.o. female with past medical history of hypertension, diabetes, COPD, and CKD who presents to the ED complaining of fall.  Patient reports that just prior to arrival she was in her kitchen when her legs gave out on her and she fell to the ground, striking her head as well as her left side.  She denies any loss of consciousness and does not take a blood thinner, but does report significant bleeding from the left side of her head.  She primarily complains of pain around her left hip and upper thigh, has not been able to walk since the fall.  She denies any pain in her neck, chest, abdomen, or upper extremities.     Physical Exam   Triage Vital Signs: ED Triage Vitals  Encounter Vitals Group     BP 03/24/24 2217 (!) 242/82     Girls Systolic BP Percentile --      Girls Diastolic BP Percentile --      Boys Systolic BP Percentile --      Boys Diastolic BP Percentile --      Pulse Rate 03/24/24 2217 79     Resp 03/24/24 2217 18     Temp 03/24/24 2217 98.5 F (36.9 C)     Temp src --      SpO2 03/24/24 2203 94 %     Weight 03/24/24 2217 101 lb 13.6 oz (46.2 kg)     Height 03/24/24 2217 5' 3 (1.6 m)     Head Circumference --      Peak Flow --      Pain Score 03/24/24 2217 10     Pain Loc --      Pain Education --      Exclude from Growth Chart --     Most recent vital signs: Vitals:   03/25/24 0530 03/25/24 0630  BP: (!) 183/65 (!) 162/68  Pulse: 80 82  Resp: 11 11  Temp:    SpO2: 100% 99%    Constitutional: Alert and oriented. Eyes: Conjunctivae are normal. Head: V-shaped laceration to left parietal scalp.  No facial bony tenderness to palpation. Nose: No congestion/rhinnorhea. Mouth/Throat: Mucous membranes are moist.  Neck: Cervical collar in place, no  midline cervical spine tenderness to palpation. Cardiovascular: Normal rate, regular rhythm. Grossly normal heart sounds.  2+ radial and DP pulses bilaterally. Respiratory: Normal respiratory effort.  No retractions. Lungs CTAB.  Left lower chest wall tenderness to palpation. Gastrointestinal: Soft and nontender. No distention. Musculoskeletal: Diffuse tenderness to palpation at the left hip, no tenderness to palpation of the left knee, ankle, or right lower extremity.  No upper extremity bony tenderness to palpation. Neurologic:  Normal speech and language. No gross focal neurologic deficits are appreciated.    ED Results / Procedures / Treatments   Labs (all labs ordered are listed, but only abnormal results are displayed) Labs Reviewed  CBC WITH DIFFERENTIAL/PLATELET - Abnormal; Notable for the following components:      Result Value   WBC 14.3 (*)    RBC 3.43 (*)    Hemoglobin 10.3 (*)    HCT 34.6 (*)    MCV 100.9 (*)    MCHC 29.8 (*)    Neutro Abs 12.0 (*)  Abs Immature Granulocytes 0.09 (*)    All other components within normal limits  BASIC METABOLIC PANEL WITH GFR - Abnormal; Notable for the following components:   Glucose, Bld 150 (*)    BUN 25 (*)    Calcium  8.8 (*)    GFR, Estimated 56 (*)    All other components within normal limits  PROTIME-INR  TYPE AND SCREEN   ED ECG REPORT I, Carlin Palin, the attending physician, personally viewed and interpreted this ECG.   Date: 03/25/2024  EKG Time: 1:24  Rate: 83  Rhythm: normal sinus rhythm  Axis: Normal  Intervals:none  ST&T Change: NOne   RADIOLOGY Left hip x-ray reviewed and interpreted by me with intertrochanteric fracture.  PROCEDURES:  Critical Care performed: No  .Laceration Repair  Date/Time: 03/25/2024 1:14 AM  Performed by: Palin Carlin, MD Authorized by: Palin Carlin, MD   Consent:    Consent obtained:  Verbal   Consent given by:  Patient   Risks, benefits, and alternatives were  discussed: yes     Risks discussed:  Infection, pain, retained foreign body, tendon damage, vascular damage, poor cosmetic result, poor wound healing, nerve damage and need for additional repair Universal protocol:    Patient identity confirmed:  Verbally with patient and arm band Anesthesia:    Anesthesia method:  Local infiltration   Local anesthetic:  Lidocaine  2% WITH epi Laceration details:    Location:  Scalp   Scalp location:  L parietal   Length (cm):  8 Pre-procedure details:    Preparation:  Patient was prepped and draped in usual sterile fashion and imaging obtained to evaluate for foreign bodies Exploration:    Limited defect created (wound extended): no     Hemostasis achieved with:  Direct pressure   Imaging obtained comment:  CT   Imaging outcome: foreign body not noted     Wound exploration: wound explored through full range of motion and entire depth of wound visualized     Wound extent: areolar tissue not violated, fascia not violated, no foreign body, no signs of injury, no nerve damage, no tendon damage, no underlying fracture and no vascular damage     Contaminated: no   Treatment:    Area cleansed with:  Saline   Amount of cleaning:  Standard   Irrigation solution:  Sterile saline   Irrigation method:  Pressure wash   Visualized foreign bodies/material removed: no     Debridement:  None   Undermining:  None   Scar revision: no   Skin repair:    Repair method:  Sutures   Suture size:  4-0   Suture material:  Nylon   Suture technique:  Simple interrupted   Number of sutures:  5 Approximation:    Approximation:  Loose Repair type:    Repair type:  Simple Post-procedure details:    Dressing:  Open (no dressing)   Procedure completion:  Tolerated well, no immediate complications    MEDICATIONS ORDERED IN ED: Medications  morphine  (PF) 4 MG/ML injection 4 mg (4 mg Intravenous Given 03/25/24 0005)  Tdap (BOOSTRIX) injection 0.5 mL (0.5 mLs Intramuscular  Given 03/25/24 0010)  lidocaine -EPINEPHrine  (XYLOCAINE  W/EPI) 2 %-1:100000 (with pres) injection 20 mL (20 mLs Infiltration Given by Other 03/25/24 0107)     IMPRESSION / MDM / ASSESSMENT AND PLAN / ED COURSE  I reviewed the triage vital signs and the nursing notes.  89 y.o. female with past medical history of hypertension, diabetes, CKD, and COPD who presents to the ED complaining of fall where she struck her head as well as her left hip.  Patient's presentation is most consistent with acute presentation with potential threat to life or bodily function.  Differential diagnosis includes, but is not limited to, fracture, dislocation, intracranial injury, cervical spine injury, neurovascular compromise.  Patient nontoxic-appearing and in no acute distress, vital signs remarkable for hypertension but otherwise reassuring.  She complains of significant left hip pain and x-ray shows intertrochanteric fracture.  No evidence of injury to her knee or ankle on this side and she is neurovascular intact distally.  She has some left lower chest wall tenderness, but chest x-ray is also unremarkable.  CT head and cervical spine are negative for acute process, will repair laceration to left parietal scalp.  EKG and labs are pending at this time, will treat pain with IV morphine .  Case discussed with Dr. Maryrose of orthopedics, who will plan for operative intervention tomorrow.  Labs with mild leukocytosis but no findings to suggest infectious process, no significant anemia, electrolyte abnormality, or AKI noted.  Coags are unremarkable, pain improved following IV morphine , case discussed with hospitalist for admission.      FINAL CLINICAL IMPRESSION(S) / ED DIAGNOSES   Final diagnoses:  Fall, initial encounter  Laceration of scalp, initial encounter  Closed fracture of left hip, initial encounter [facility])     Rx / DC Orders   ED Discharge Orders     None        Note:   This document was prepared using Dragon voice recognition software and may include unintentional dictation errors.   Willo Dunnings, MD 03/25/24 9884    Willo Dunnings, MD 03/25/24 [phone]

## 2024-03-24 NOTE — ED Notes (Signed)
 Attempted to call pts daughter x2 with no answer

## 2024-03-24 NOTE — ED Triage Notes (Signed)
 Pt arrives via EMS from home for fall; laceration to left side of head and left hip/thigh pain ; no LOC

## 2024-03-25 ENCOUNTER — Other Ambulatory Visit: Payer: Self-pay

## 2024-03-25 ENCOUNTER — Inpatient Hospital Stay

## 2024-03-25 ENCOUNTER — Encounter: Admission: EM | Disposition: E | Payer: Self-pay | Source: Home / Self Care | Attending: Internal Medicine

## 2024-03-25 ENCOUNTER — Inpatient Hospital Stay (HOSPITAL_COMMUNITY)
Admit: 2024-03-25 | Discharge: 2024-03-25 | Disposition: A | Discharge status: Home / Self Care | Attending: Internal Medicine | Admitting: Internal Medicine

## 2024-03-25 DIAGNOSIS — E890 Postprocedural hypothyroidism: Secondary | ICD-10-CM | POA: Diagnosis present

## 2024-03-25 DIAGNOSIS — F32A Depression, unspecified: Secondary | ICD-10-CM | POA: Diagnosis present

## 2024-03-25 DIAGNOSIS — Z01810 Encounter for preprocedural cardiovascular examination: Secondary | ICD-10-CM

## 2024-03-25 DIAGNOSIS — E1122 Type 2 diabetes mellitus with diabetic chronic kidney disease: Secondary | ICD-10-CM | POA: Diagnosis not present

## 2024-03-25 DIAGNOSIS — S72009A Fracture of unspecified part of neck of unspecified femur, initial encounter for closed fracture: Secondary | ICD-10-CM | POA: Diagnosis present

## 2024-03-25 DIAGNOSIS — W010XXA Fall on same level from slipping, tripping and stumbling without subsequent striking against object, initial encounter: Secondary | ICD-10-CM | POA: Diagnosis present

## 2024-03-25 DIAGNOSIS — Z681 Body mass index (BMI) 19 or less, adult: Secondary | ICD-10-CM | POA: Diagnosis not present

## 2024-03-25 DIAGNOSIS — S72142A Displaced intertrochanteric fracture of left femur, initial encounter for closed fracture: Secondary | ICD-10-CM | POA: Insufficient documentation

## 2024-03-25 DIAGNOSIS — Z23 Encounter for immunization: Secondary | ICD-10-CM | POA: Diagnosis present

## 2024-03-25 DIAGNOSIS — Y92009 Unspecified place in unspecified non-institutional (private) residence as the place of occurrence of the external cause: Secondary | ICD-10-CM | POA: Diagnosis not present

## 2024-03-25 DIAGNOSIS — Z833 Family history of diabetes mellitus: Secondary | ICD-10-CM | POA: Diagnosis not present

## 2024-03-25 DIAGNOSIS — N1831 Chronic kidney disease, stage 3a: Secondary | ICD-10-CM | POA: Diagnosis not present

## 2024-03-25 DIAGNOSIS — Z79890 Hormone replacement therapy (postmenopausal): Secondary | ICD-10-CM | POA: Diagnosis not present

## 2024-03-25 DIAGNOSIS — Z66 Do not resuscitate: Secondary | ICD-10-CM | POA: Diagnosis present

## 2024-03-25 DIAGNOSIS — E440 Moderate protein-calorie malnutrition: Secondary | ICD-10-CM | POA: Diagnosis present

## 2024-03-25 DIAGNOSIS — E785 Hyperlipidemia, unspecified: Secondary | ICD-10-CM | POA: Diagnosis not present

## 2024-03-25 DIAGNOSIS — I129 Hypertensive chronic kidney disease with stage 1 through stage 4 chronic kidney disease, or unspecified chronic kidney disease: Secondary | ICD-10-CM | POA: Diagnosis not present

## 2024-03-25 DIAGNOSIS — Z8249 Family history of ischemic heart disease and other diseases of the circulatory system: Secondary | ICD-10-CM | POA: Diagnosis not present

## 2024-03-25 DIAGNOSIS — Z87891 Personal history of nicotine dependence: Secondary | ICD-10-CM | POA: Diagnosis not present

## 2024-03-25 DIAGNOSIS — F419 Anxiety disorder, unspecified: Secondary | ICD-10-CM | POA: Diagnosis present

## 2024-03-25 DIAGNOSIS — Z841 Family history of disorders of kidney and ureter: Secondary | ICD-10-CM | POA: Diagnosis not present

## 2024-03-25 DIAGNOSIS — M810 Age-related osteoporosis without current pathological fracture: Secondary | ICD-10-CM | POA: Diagnosis present

## 2024-03-25 DIAGNOSIS — Z7982 Long term (current) use of aspirin: Secondary | ICD-10-CM | POA: Diagnosis not present

## 2024-03-25 DIAGNOSIS — S72002A Fracture of unspecified part of neck of left femur, initial encounter for closed fracture: Secondary | ICD-10-CM | POA: Diagnosis present

## 2024-03-25 DIAGNOSIS — K219 Gastro-esophageal reflux disease without esophagitis: Secondary | ICD-10-CM | POA: Diagnosis present

## 2024-03-25 DIAGNOSIS — J449 Chronic obstructive pulmonary disease, unspecified: Secondary | ICD-10-CM | POA: Diagnosis present

## 2024-03-25 DIAGNOSIS — I469 Cardiac arrest, cause unspecified: Secondary | ICD-10-CM | POA: Diagnosis not present

## 2024-03-25 DIAGNOSIS — D631 Anemia in chronic kidney disease: Secondary | ICD-10-CM | POA: Diagnosis not present

## 2024-03-25 DIAGNOSIS — D72829 Elevated white blood cell count, unspecified: Secondary | ICD-10-CM | POA: Diagnosis present

## 2024-03-25 DIAGNOSIS — Z79899 Other long term (current) drug therapy: Secondary | ICD-10-CM | POA: Diagnosis not present

## 2024-03-25 HISTORY — PX: INTRAMEDULLARY (IM) NAIL INTERTROCHANTERIC: SHX5875

## 2024-03-25 LAB — ECHOCARDIOGRAM COMPLETE
AR max vel: 2.81 cm2
AV Area VTI: 2.81 cm2
AV Area mean vel: 2.79 cm2
AV Mean grad: 4 mmHg
AV Peak grad: 6.1 mmHg
Ao pk vel: 1.23 m/s
Area-P 1/2: 2.78 cm2
Height: 63 in
MV VTI: 2.85 cm2
S' Lateral: 2.62 cm
Weight: 1629.64 [oz_av]

## 2024-03-25 LAB — BASIC METABOLIC PANEL WITH GFR
Anion gap: 11 (ref 5–15)
BUN: 25 mg/dL — ABNORMAL HIGH (ref 8–23)
CO2: 26 mmol/L (ref 22–32)
Calcium: 8.8 mg/dL — ABNORMAL LOW (ref 8.9–10.3)
Chloride: 102 mmol/L (ref 98–111)
Creatinine, Ser: 0.95 mg/dL (ref 0.44–1.00)
GFR, Estimated: 56 mL/min — ABNORMAL LOW (ref >60)
Glucose, Bld: 150 mg/dL — ABNORMAL HIGH (ref 70–99)
Potassium: 4.7 mmol/L (ref 3.5–5.1)
Sodium: 139 mmol/L (ref 135–145)

## 2024-03-25 LAB — CBC WITH DIFFERENTIAL/PLATELET
Abs Immature Granulocytes: 0.09 K/uL — ABNORMAL HIGH (ref 0.00–0.07)
Basophils Absolute: 0 K/uL (ref 0.0–0.1)
Basophils Relative: 0 %
Eosinophils Absolute: 0.1 K/uL (ref 0.0–0.5)
Eosinophils Relative: 0 %
HCT: 34.6 % — ABNORMAL LOW (ref 36.0–46.0)
Hemoglobin: 10.3 g/dL — ABNORMAL LOW (ref 12.0–15.0)
Immature Granulocytes: 1 %
Lymphocytes Relative: 9 %
Lymphs Abs: 1.3 K/uL (ref 0.7–4.0)
MCH: 30 pg (ref 26.0–34.0)
MCHC: 29.8 g/dL — ABNORMAL LOW (ref 30.0–36.0)
MCV: 100.9 fL — ABNORMAL HIGH (ref 80.0–100.0)
Monocytes Absolute: 0.9 K/uL (ref 0.1–1.0)
Monocytes Relative: 6 %
Neutro Abs: 12 K/uL — ABNORMAL HIGH (ref 1.7–7.7)
Neutrophils Relative %: 84 %
Platelets: 266 K/uL (ref 150–400)
RBC: 3.43 MIL/uL — ABNORMAL LOW (ref 3.87–5.11)
RDW: 13.3 % (ref 11.5–15.5)
WBC: 14.3 K/uL — ABNORMAL HIGH (ref 4.0–10.5)
nRBC: 0 % (ref 0.0–0.2)

## 2024-03-25 LAB — TYPE AND SCREEN
ABO/RH(D): O POS
Antibody Screen: NEGATIVE

## 2024-03-25 LAB — CBG MONITORING, ED
Glucose-Capillary: 112 mg/dL — ABNORMAL HIGH (ref 70–99)
Glucose-Capillary: 129 mg/dL — ABNORMAL HIGH (ref 70–99)

## 2024-03-25 LAB — PROTIME-INR
INR: 1.1 (ref 0.8–1.2)
Prothrombin Time: 14.5 s (ref 11.4–15.2)

## 2024-03-25 SURGERY — FIXATION, FRACTURE, INTERTROCHANTERIC, WITH INTRAMEDULLARY ROD
Anesthesia: General | Site: Hip | Laterality: Left

## 2024-03-25 MED ORDER — FENTANYL CITRATE (PF) 100 MCG/2ML IJ SOLN
INTRAMUSCULAR | Status: DC | PRN
  Administered 2024-03-25 (×4): 25 ug via INTRAVENOUS

## 2024-03-25 MED ORDER — DULOXETINE HCL 30 MG PO CPEP: 30.0000 mg | ORAL_CAPSULE | Freq: Every day | ORAL | Status: DC

## 2024-03-25 MED ORDER — OXYBUTYNIN CHLORIDE ER 5 MG PO TB24
5.0000 mg | ORAL_TABLET | Freq: Every day | ORAL | Status: DC
  Filled 2024-03-25: qty 1

## 2024-03-25 MED ORDER — NORTRIPTYLINE HCL 25 MG PO CAPS
25.0000 mg | ORAL_CAPSULE | Freq: Every day | ORAL | Status: DC
  Administered 2024-03-25: 25 mg via ORAL
  Filled 2024-03-25 (×2): qty 1

## 2024-03-25 MED ORDER — PHENYLEPHRINE 80 MCG/ML (10ML) SYRINGE FOR IV PUSH (FOR BLOOD PRESSURE SUPPORT)
PREFILLED_SYRINGE | INTRAVENOUS | Status: DC | PRN
  Administered 2024-03-25 (×2): 240 ug via INTRAVENOUS
  Administered 2024-03-25: 80 ug via INTRAVENOUS
  Administered 2024-03-25: 160 ug via INTRAVENOUS
  Administered 2024-03-25: 80 ug via INTRAVENOUS

## 2024-03-25 MED ORDER — TIOTROPIUM BROMIDE MONOHYDRATE 2.5 MCG/ACT IN AERS: 1.0000 | INHALATION_SPRAY | Freq: Every day | RESPIRATORY_TRACT | Status: DC

## 2024-03-25 MED ORDER — ONDANSETRON HCL 4 MG/2ML IJ SOLN
INTRAMUSCULAR | Status: DC | PRN
Start: 2024-03-25 — End: 2024-03-25
  Administered 2024-03-25: 4 mg via INTRAVENOUS

## 2024-03-25 MED ORDER — BISACODYL 10 MG RE SUPP: 10.0000 mg | Freq: Every day | RECTAL | Status: DC | PRN

## 2024-03-25 MED ORDER — ASPIRIN 81 MG PO TBEC: 81.0000 mg | DELAYED_RELEASE_TABLET | Freq: Every day | ORAL | Status: DC

## 2024-03-25 MED ORDER — HYDROCHLOROTHIAZIDE 25 MG PO TABS: 25.0000 mg | ORAL_TABLET | Freq: Every day | ORAL | Status: DC

## 2024-03-25 MED ORDER — HYDROMORPHONE HCL 1 MG/ML IJ SOLN: 0.5000 mg | INTRAMUSCULAR | Status: DC | PRN

## 2024-03-25 MED ORDER — MORPHINE SULFATE (PF) 4 MG/ML IV SOLN
4.0000 mg | Freq: Once | INTRAVENOUS | Status: AC
  Administered 2024-03-25: 4 mg via INTRAVENOUS
  Filled 2024-03-25: qty 1

## 2024-03-25 MED ORDER — HYDROCODONE-ACETAMINOPHEN 5-325 MG PO TABS: 1.0000 | ORAL_TABLET | Freq: Four times a day (QID) | ORAL | Status: DC | PRN

## 2024-03-25 MED ORDER — BISACODYL 5 MG PO TBEC: 5.0000 mg | DELAYED_RELEASE_TABLET | Freq: Every day | ORAL | Status: DC | PRN

## 2024-03-25 MED ORDER — ACETAMINOPHEN 325 MG PO TABS: 650.0000 mg | ORAL_TABLET | Freq: Four times a day (QID) | ORAL | Status: DC | PRN

## 2024-03-25 MED ORDER — UMECLIDINIUM BROMIDE 62.5 MCG/ACT IN AEPB
1.0000 | INHALATION_SPRAY | Freq: Every day | RESPIRATORY_TRACT | Status: DC
  Filled 2024-03-25: qty 7

## 2024-03-25 MED ORDER — LATANOPROST 0.005 % OP SOLN
1.0000 [drp] | Freq: Every day | OPHTHALMIC | Status: DC
  Administered 2024-03-25: 1 [drp] via OPHTHALMIC
  Filled 2024-03-25 (×2): qty 2.5

## 2024-03-25 MED ORDER — OXYCODONE HCL 5 MG PO TABS
ORAL_TABLET | ORAL | Status: AC
  Filled 2024-03-25: qty 1

## 2024-03-25 MED ORDER — MELATONIN 5 MG PO TABS
5.0000 mg | ORAL_TABLET | Freq: Every day | ORAL | Status: DC
Start: 2024-03-25 — End: 2024-03-26
  Administered 2024-03-25: 5 mg via ORAL
  Filled 2024-03-25: qty 1

## 2024-03-25 MED ORDER — TRANEXAMIC ACID-NACL 1000-0.7 MG/100ML-% IV SOLN
1000.0000 mg | Freq: Once | INTRAVENOUS | Status: AC
  Administered 2024-03-25: 1000 mg via INTRAVENOUS
  Filled 2024-03-25: qty 100

## 2024-03-25 MED ORDER — AMLODIPINE BESYLATE 5 MG PO TABS
5.0000 mg | ORAL_TABLET | Freq: Every day | ORAL | Status: DC
Start: 2024-03-26 — End: 2024-03-25

## 2024-03-25 MED ORDER — VALSARTAN-HYDROCHLOROTHIAZIDE 160-25 MG PO TABS
1.0000 | ORAL_TABLET | Freq: Every day | ORAL | Status: DC
Start: 2024-03-26 — End: 2024-03-25

## 2024-03-25 MED ORDER — SENNOSIDES-DOCUSATE SODIUM 8.6-50 MG PO TABS: 1.0000 | ORAL_TABLET | Freq: Every evening | ORAL | Status: DC | PRN

## 2024-03-25 MED ORDER — HYDRALAZINE HCL 20 MG/ML IJ SOLN
5.0000 mg | Freq: Four times a day (QID) | INTRAMUSCULAR | Status: DC | PRN
Start: 2024-03-25 — End: 2024-03-25

## 2024-03-25 MED ORDER — IRBESARTAN 150 MG PO TABS
150.0000 mg | ORAL_TABLET | Freq: Every day | ORAL | Status: DC
  Filled 2024-03-25: qty 1

## 2024-03-25 MED ORDER — LEVOTHYROXINE SODIUM 50 MCG PO TABS: 100.0000 ug | ORAL_TABLET | Freq: Every day | ORAL | Status: DC

## 2024-03-25 MED ORDER — OXYCODONE HCL 5 MG PO TABS
5.0000 mg | ORAL_TABLET | Freq: Once | ORAL | Status: AC | PRN
  Administered 2024-03-25: 5 mg via ORAL

## 2024-03-25 MED ORDER — METHOCARBAMOL 1000 MG/10ML IJ SOLN: 500.0000 mg | Freq: Four times a day (QID) | INTRAMUSCULAR | Status: DC | PRN

## 2024-03-25 MED ORDER — SODIUM CHLORIDE 0.9 % IV SOLN: INTRAVENOUS | Status: DC

## 2024-03-25 MED ORDER — HYDROMORPHONE HCL 1 MG/ML IJ SOLN: 0.2000 mg | INTRAMUSCULAR | Status: DC | PRN

## 2024-03-25 MED ORDER — TRAMADOL HCL 50 MG PO TABS: 50.0000 mg | ORAL_TABLET | Freq: Four times a day (QID) | ORAL | Status: DC | PRN

## 2024-03-25 MED ORDER — OXYCODONE HCL 5 MG PO TABS: 2.5000 mg | ORAL_TABLET | ORAL | Status: DC | PRN

## 2024-03-25 MED ORDER — IRBESARTAN 150 MG PO TABS: 150.0000 mg | ORAL_TABLET | Freq: Every day | ORAL | Status: DC

## 2024-03-25 MED ORDER — FENTANYL CITRATE (PF) 100 MCG/2ML IJ SOLN
INTRAMUSCULAR | Status: AC
  Filled 2024-03-25: qty 2

## 2024-03-25 MED ORDER — ONDANSETRON HCL 4 MG/2ML IJ SOLN: 4.0000 mg | Freq: Four times a day (QID) | INTRAMUSCULAR | Status: DC | PRN

## 2024-03-25 MED ORDER — 0.9 % SODIUM CHLORIDE (POUR BTL) OPTIME
TOPICAL | Status: DC | PRN
  Administered 2024-03-25: 500 mL

## 2024-03-25 MED ORDER — BUPIVACAINE LIPOSOME 1.3 % IJ SUSP
INTRAMUSCULAR | Status: DC | PRN
  Administered 2024-03-25: 40 mL via INTRAMUSCULAR

## 2024-03-25 MED ORDER — TRANEXAMIC ACID-NACL 1000-0.7 MG/100ML-% IV SOLN
INTRAVENOUS | Status: DC | PRN
  Administered 2024-03-25: 1000 mg via INTRAVENOUS

## 2024-03-25 MED ORDER — METOPROLOL TARTRATE 25 MG PO TABS
12.5000 mg | ORAL_TABLET | Freq: Two times a day (BID) | ORAL | Status: DC
  Administered 2024-03-25: 12.5 mg via ORAL
  Filled 2024-03-25: qty 1

## 2024-03-25 MED ORDER — ENSURE PLUS HIGH PROTEIN PO LIQD: 237.0000 mL | Freq: Two times a day (BID) | ORAL | Status: DC

## 2024-03-25 MED ORDER — ROCURONIUM BROMIDE 100 MG/10ML IV SOLN
INTRAVENOUS | Status: DC | PRN
  Administered 2024-03-25: 30 mg via INTRAVENOUS

## 2024-03-25 MED ORDER — LIDOCAINE-EPINEPHRINE 2 %-1:100000 IJ SOLN
20.0000 mL | Freq: Once | INTRAMUSCULAR | Status: AC
  Administered 2024-03-25: 20 mL
  Filled 2024-03-25: qty 1

## 2024-03-25 MED ORDER — EPHEDRINE SULFATE-NACL 50-0.9 MG/10ML-% IV SOSY
PREFILLED_SYRINGE | INTRAVENOUS | Status: DC | PRN
  Administered 2024-03-25 (×2): 10 mg via INTRAVENOUS
  Administered 2024-03-25: 5 mg via INTRAVENOUS

## 2024-03-25 MED ORDER — SUGAMMADEX SODIUM 200 MG/2ML IV SOLN
INTRAVENOUS | Status: DC | PRN
Start: 2024-03-25 — End: 2024-03-25
  Administered 2024-03-25: 200 mg via INTRAVENOUS

## 2024-03-25 MED ORDER — CEFAZOLIN SODIUM-DEXTROSE 2-4 GM/100ML-% IV SOLN
INTRAVENOUS | Status: AC
  Filled 2024-03-25: qty 100

## 2024-03-25 MED ORDER — CEFAZOLIN SODIUM-DEXTROSE 2-4 GM/100ML-% IV SOLN
2.0000 g | INTRAVENOUS | Status: AC
  Administered 2024-03-25: 2 g via INTRAVENOUS

## 2024-03-25 MED ORDER — PREGABALIN 50 MG PO CAPS
50.0000 mg | ORAL_CAPSULE | Freq: Two times a day (BID) | ORAL | Status: DC
  Administered 2024-03-25: 50 mg via ORAL
  Filled 2024-03-25: qty 1

## 2024-03-25 MED ORDER — TETANUS-DIPHTH-ACELL PERTUSSIS 5-2.5-18.5 LF-MCG/0.5 IM SUSY
0.5000 mL | PREFILLED_SYRINGE | Freq: Once | INTRAMUSCULAR | Status: AC
  Administered 2024-03-25: 0.5 mL via INTRAMUSCULAR
  Filled 2024-03-25: qty 0.5

## 2024-03-25 MED ORDER — DEXAMETHASONE SODIUM PHOSPHATE 10 MG/ML IJ SOLN
INTRAMUSCULAR | Status: DC | PRN
  Administered 2024-03-25: 10 mg via INTRAVENOUS

## 2024-03-25 MED ORDER — FENTANYL CITRATE (PF) 100 MCG/2ML IJ SOLN: 25.0000 ug | INTRAMUSCULAR | Status: DC | PRN

## 2024-03-25 MED ORDER — FLEET ENEMA RE ENEM: 1.0000 | ENEMA | Freq: Once | RECTAL | Status: DC | PRN

## 2024-03-25 MED ORDER — BUPIVACAINE LIPOSOME 1.3 % IJ SUSP
INTRAMUSCULAR | Status: AC
  Filled 2024-03-25: qty 20

## 2024-03-25 MED ORDER — LACTATED RINGERS IV SOLN: INTRAVENOUS | Status: DC | PRN

## 2024-03-25 MED ORDER — ACETAMINOPHEN 500 MG PO TABS
1000.0000 mg | ORAL_TABLET | Freq: Three times a day (TID) | ORAL | Status: DC
  Administered 2024-03-25: 1000 mg via ORAL
  Filled 2024-03-25: qty 2

## 2024-03-25 MED ORDER — DORZOLAMIDE HCL 2 % OP SOLN
1.0000 [drp] | Freq: Two times a day (BID) | OPHTHALMIC | Status: DC
  Administered 2024-03-25: 1 [drp] via OPHTHALMIC
  Filled 2024-03-25 (×2): qty 10

## 2024-03-25 MED ORDER — PANTOPRAZOLE SODIUM 40 MG PO TBEC
40.0000 mg | DELAYED_RELEASE_TABLET | Freq: Every day | ORAL | Status: DC
  Administered 2024-03-25: 40 mg via ORAL
  Filled 2024-03-25: qty 1

## 2024-03-25 MED ORDER — ENOXAPARIN SODIUM 30 MG/0.3ML IJ SOSY: 30.0000 mg | PREFILLED_SYRINGE | INTRAMUSCULAR | Status: DC

## 2024-03-25 MED ORDER — PHENYLEPHRINE HCL-NACL 20-0.9 MG/250ML-% IV SOLN
INTRAVENOUS | Status: DC | PRN
  Administered 2024-03-25: 50 ug/min via INTRAVENOUS

## 2024-03-25 MED ORDER — METOCLOPRAMIDE HCL 5 MG/ML IJ SOLN: 5.0000 mg | Freq: Three times a day (TID) | INTRAMUSCULAR | Status: DC | PRN

## 2024-03-25 MED ORDER — ENOXAPARIN SODIUM 40 MG/0.4ML IJ SOSY: 40.0000 mg | PREFILLED_SYRINGE | INTRAMUSCULAR | Status: DC

## 2024-03-25 MED ORDER — METHOCARBAMOL 500 MG PO TABS: 500.0000 mg | ORAL_TABLET | Freq: Four times a day (QID) | ORAL | Status: DC | PRN

## 2024-03-25 MED ORDER — OXYCODONE HCL 5 MG PO TABS: 5.0000 mg | ORAL_TABLET | ORAL | Status: DC | PRN

## 2024-03-25 MED ORDER — PROPOFOL 10 MG/ML IV BOLUS
INTRAVENOUS | Status: DC | PRN
  Administered 2024-03-25: 40 mg via INTRAVENOUS

## 2024-03-25 MED ORDER — OXYCODONE HCL 5 MG/5ML PO SOLN: 5.0000 mg | Freq: Once | ORAL | Status: AC | PRN

## 2024-03-25 MED ORDER — PRAVASTATIN SODIUM 20 MG PO TABS: 40.0000 mg | ORAL_TABLET | Freq: Every day | ORAL | Status: DC

## 2024-03-25 MED ORDER — PHENYLEPHRINE HCL-NACL 20-0.9 MG/250ML-% IV SOLN
INTRAVENOUS | Status: AC
  Filled 2024-03-25: qty 250

## 2024-03-25 MED ORDER — ONDANSETRON HCL 4 MG PO TABS: 4.0000 mg | ORAL_TABLET | Freq: Four times a day (QID) | ORAL | Status: DC | PRN

## 2024-03-25 MED ORDER — LIDOCAINE HCL (CARDIAC) PF 100 MG/5ML IV SOSY
PREFILLED_SYRINGE | INTRAVENOUS | Status: DC | PRN
  Administered 2024-03-25: 40 mg via INTRAVENOUS

## 2024-03-25 MED ORDER — TRANEXAMIC ACID-NACL 1000-0.7 MG/100ML-% IV SOLN
INTRAVENOUS | Status: AC
  Filled 2024-03-25: qty 100

## 2024-03-25 MED ORDER — DOCUSATE SODIUM 100 MG PO CAPS
100.0000 mg | ORAL_CAPSULE | Freq: Two times a day (BID) | ORAL | Status: DC
  Administered 2024-03-25: 100 mg via ORAL
  Filled 2024-03-25: qty 1

## 2024-03-25 MED ORDER — AMLODIPINE BESYLATE 5 MG PO TABS: 5.0000 mg | ORAL_TABLET | Freq: Every day | ORAL | Status: DC

## 2024-03-25 MED ORDER — LABETALOL HCL 5 MG/ML IV SOLN
10.0000 mg | Freq: Once | INTRAVENOUS | Status: DC
  Filled 2024-03-25: qty 4

## 2024-03-25 MED ORDER — ALBUMIN HUMAN 5 % IV SOLN
INTRAVENOUS | Status: AC
  Filled 2024-03-25: qty 500

## 2024-03-25 MED ORDER — ALBUTEROL SULFATE (2.5 MG/3ML) 0.083% IN NEBU: 2.5000 mg | INHALATION_SOLUTION | Freq: Four times a day (QID) | RESPIRATORY_TRACT | Status: DC | PRN

## 2024-03-25 MED ORDER — ORAL CARE MOUTH RINSE: 15.0000 mL | OROMUCOSAL | Status: DC | PRN

## 2024-03-25 MED ORDER — METOCLOPRAMIDE HCL 5 MG PO TABS: 5.0000 mg | ORAL_TABLET | Freq: Three times a day (TID) | ORAL | Status: DC | PRN

## 2024-03-25 MED ORDER — PHENYLEPHRINE 80 MCG/ML (10ML) SYRINGE FOR IV PUSH (FOR BLOOD PRESSURE SUPPORT)
PREFILLED_SYRINGE | INTRAVENOUS | Status: AC
  Filled 2024-03-25: qty 10

## 2024-03-25 MED ORDER — ALBUMIN HUMAN 5 % IV SOLN: INTRAVENOUS | Status: DC | PRN

## 2024-03-25 MED ORDER — CEFAZOLIN SODIUM-DEXTROSE 1-4 GM/50ML-% IV SOLN
1.0000 g | Freq: Four times a day (QID) | INTRAVENOUS | Status: DC
  Administered 2024-03-25: 1 g via INTRAVENOUS
  Filled 2024-03-25 (×3): qty 50

## 2024-03-25 MED ORDER — BUPIVACAINE HCL (PF) 0.5 % IJ SOLN
INTRAMUSCULAR | Status: AC
  Filled 2024-03-25: qty 30

## 2024-03-25 SURGICAL SUPPLY — 31 items
BIT DRILL INTERTAN LAG SCREW (BIT) IMPLANT
BIT DRILL LONG 4.0 (BIT) IMPLANT
CHLORAPREP W/TINT 26 (MISCELLANEOUS) ×1 IMPLANT
DRAPE SHEET LG 3/4 BI-LAMINATE (DRAPES) ×1 IMPLANT
DRAPE U-SHAPE 47X51 STRL (DRAPES) ×2 IMPLANT
DRSG OPSITE POSTOP 3X4 (GAUZE/BANDAGES/DRESSINGS) ×1 IMPLANT
DRSG OPSITE POSTOP 4X6 (GAUZE/BANDAGES/DRESSINGS) ×1 IMPLANT
ELECTRODE REM PT RTRN 9FT ADLT (ELECTROSURGICAL) ×1 IMPLANT
GLOVE BIOGEL PI IND STRL 8 (GLOVE) ×1 IMPLANT
GLOVE SURG SYN 7.5 PF PI (GLOVE) ×1 IMPLANT
GOWN SRG LRG LVL 4 IMPRV REINF (GOWNS) ×1 IMPLANT
GOWN SRG XL LONG LVL 3 NONREIN (GOWNS) ×1 IMPLANT
KIT PATIENT CARE HANA TABLE (KITS) ×1 IMPLANT
KIT TURNOVER CYSTO (KITS) ×1 IMPLANT
MANIFOLD NEPTUNE II (INSTRUMENTS) ×1 IMPLANT
MAT ABSORB FLUID 56X50 GRAY (MISCELLANEOUS) ×2 IMPLANT
NAIL TRIGEN INTERTAN 10X18CM (Nail) IMPLANT
NDL HYPO 22X1.5 SAFETY MO (MISCELLANEOUS) ×1 IMPLANT
NEEDLE HYPO 22X1.5 SAFETY MO (MISCELLANEOUS) ×1 IMPLANT
NS IRRIG 500ML POUR BTL (IV SOLUTION) ×1 IMPLANT
PACK HIP COMPR (MISCELLANEOUS) ×1 IMPLANT
PENCIL SMOKE EVACUATOR (MISCELLANEOUS) ×1 IMPLANT
PIN GUIDE 3.2X343MM (PIN) IMPLANT
SCREW LAG COMPR KIT 85/80 (Screw) IMPLANT
SCREW TRIGEN LOW PROF 5.0X27.5 (Screw) IMPLANT
STAPLER SKIN PROX 35W (STAPLE) ×1 IMPLANT
SUT VIC AB 2-0 CT2 27 (SUTURE) ×1 IMPLANT
SYR 30ML LL (SYRINGE) ×1 IMPLANT
TAPE CLOTH 3X10 WHT NS LF (GAUZE/BANDAGES/DRESSINGS) ×2 IMPLANT
TRAP FLUID SMOKE EVACUATOR (MISCELLANEOUS) ×1 IMPLANT
WATER STERILE IRR 1000ML POUR (IV SOLUTION) ×1 IMPLANT

## 2024-03-25 NOTE — Progress Notes (Signed)
 I will be taking over care of this patient. Full consult note and discussion with patient to follow later today.  Imaging reviewed.  - Plan for surgery today, likely around ~1-2pm.  - NPO until OR - Hold anticoagulation - Admit to Hospitalist team. Please medically optimize, especially in regard to patient's current BP.

## 2024-03-25 NOTE — Transfer of Care (Signed)
 Immediate Anesthesia Transfer of Care Note  Patient: Robyn Butler  Procedure(s) Performed: FIXATION, FRACTURE, INTERTROCHANTERIC, WITH INTRAMEDULLARY ROD (Left: Hip)  Patient Location: PACU  Anesthesia Type:General  Level of Consciousness: drowsy  Airway & Oxygen  Therapy: Patient Spontanous Breathing and Patient connected to face mask oxygen   Post-op Assessment: Report given to RN and Post -op Vital signs reviewed and stable  Post vital signs: Reviewed and stable  Last Vitals:  Vitals Value Taken Time  BP 119/39 03/25/24 17:08  Temp    Pulse 75 03/25/24 17:13  Resp 14 03/25/24 17:13  SpO2 100 % 03/25/24 17:13  Vitals shown include unfiled device data.  Last Pain:  Vitals:   03/25/24 1334  TempSrc:   PainSc: 0-No pain         Complications: No notable events documented.

## 2024-03-25 NOTE — H&P (Signed)
 H&P reviewed. No significant changes noted.

## 2024-03-25 NOTE — Anesthesia Procedure Notes (Addendum)
 Procedure Name: Intubation Date/Time: 03/25/2024 4:04 PM  Performed by: Laverna Duwaine BRAVO, RNPre-anesthesia Checklist: Patient identified, Emergency Drugs available, Suction available and Patient being monitored Patient Re-evaluated:Patient Re-evaluated prior to induction Oxygen  Delivery Method: Circle system utilized Preoxygenation: Pre-oxygenation with 100% oxygen  Induction Type: IV induction Ventilation: Mask ventilation without difficulty Laryngoscope Size: McGrath and 3 Grade View: Grade I Tube type: Oral Tube size: 7.0 mm Number of attempts: 1 Airway Equipment and Method: Stylet Placement Confirmation: ETT inserted through vocal cords under direct vision, positive ETCO2 and breath sounds checked- equal and bilateral Secured at: 22 cm Tube secured with: Tape Dental Injury: Teeth and Oropharynx as per pre-operative assessment  Comments: Atraumatic intubation. Teeth unchanged from prior

## 2024-03-25 NOTE — ED Notes (Signed)
 Dr. Willo notified of BP 239/76.

## 2024-03-25 NOTE — TOC CM/SW Note (Signed)
..  Transition of Care [facility]) - Inpatient Brief Assessment   Patient Details  Name: Robyn Butler MRN: [id number] Date of Birth: 12-Jan-1929  Transition of Care [facility]) CM/SW Contact:    Edsel DELENA Fischer, LCSW Phone Number: 03/25/2024, 11:43 AM   Clinical Narrative:  Pt is awaiting surgery. OT and PT eval needed.  TOC will follow up after surgery and PT/ OT evals are complete.   Transition of Care Asessment:

## 2024-03-25 NOTE — Progress Notes (Signed)
*  PRELIMINARY RESULTS* Echocardiogram 2D Echocardiogram has been performed.  Robyn Butler 03/25/2024, 3:28 PM

## 2024-03-25 NOTE — Anesthesia Postprocedure Evaluation (Signed)
 Anesthesia Post Note  Patient: Robyn Butler  Procedure(s) Performed: FIXATION, FRACTURE, INTERTROCHANTERIC, WITH INTRAMEDULLARY ROD (Left: Hip)  Patient location during evaluation: PACU Anesthesia Type: General Level of consciousness: awake and alert Pain management: pain level controlled Vital Signs Assessment: post-procedure vital signs reviewed and stable Respiratory status: spontaneous breathing, nonlabored ventilation, respiratory function stable and patient connected to nasal cannula oxygen  Cardiovascular status: blood pressure returned to baseline and stable Postop Assessment: no apparent nausea or vomiting Anesthetic complications: no   No notable events documented.   Last Vitals:  Vitals:   03/25/24 1800 03/25/24 1830  BP: (!) 125/50 (!) 115/50  Pulse: 84 82  Resp: (!) 31 18  Temp:  (!) 36.4 C  SpO2: 93% 99%    Last Pain:  Vitals:   03/25/24 1830  TempSrc: Oral  PainSc:                  Robyn Butler

## 2024-03-25 NOTE — Op Note (Signed)
 DATE OF SURGERY: 03/25/2024  PREOPERATIVE DIAGNOSIS: Left intertrochanteric hip fracture  POSTOPERATIVE DIAGNOSIS: Left intertrochanteric hip fracture  PROCEDURE: Intramedullary nailing of Left femur with cephalomedullary device  SURGEON: Earnestine HILARIO Blanch, MD  ANESTHESIA: Gen  EBL: 100 cc  IVF: per anesthesia record  COMPONENTS:  [employer] Trigen Intertan Short Nail: 10x115mm; 85mm lag screw with 80mm compression screw; 5x 27.77mm distal cortical interlocking screw  INDICATIONS: Robyn Butler is a 89 y.o. female who sustained an intertrochanteric fracture after a fall. Risks and benefits of intramedullary nailing were explained to the patient and/or family . Risks include but are not limited to bleeding, infection, injury to tissues, nerves, vessels, nonunion/malunion, hardware failure, limb length discrepancy/hip rotation mismatch and risks of anesthesia. The patient and/or family understand these risks, have completed an informed consent, and wish to proceed.   PROCEDURE:  The patient was brought into the operating room. After administering anesthesia, the patient was placed in the supine position on the Hana table. The uninjured leg was placed in an extended position while the injured lower extremity was placed in longitudinal traction. The fracture was reduced using longitudinal traction and internal rotation. The adequacy of reduction was verified fluoroscopically in AP and lateral projections and found to be acceptable. The lateral aspect of the hip and thigh were prepped with ChloraPrep solution before being draped sterilely. Preoperative IV antibiotics were administered. A timeout was performed to verify the appropriate surgical site, patient, and procedure.    The greater trochanter was identified and an approximately 6 cm incision was made about 3 fingerbreadths above the tip of the greater trochanter. The incision was carried down through the subcutaneous tissues to expose the  gluteal fascia. This was split the length of the incision, providing access to the tip of the trochanter. Under fluoroscopic guidance, a guidewire was drilled through the tip of the trochanter into the proximal metaphysis to the level of the lesser trochanter. After verifying its position fluoroscopically in AP and lateral projections, it was overreamed with the opening reamer to the level of the lesser trochanter. The nail was selected and advanced to the appropriate depth as verified fluoroscopically.    The guide system for the lag screw was positioned and advanced through an approximately 5cm incision over the lateral aspect of the proximal femur. The guidewire was drilled up through the femoral nail and into the femoral neck to rest within 5 mm of subchondral bone. After verifying its position in the femoral neck and head in both AP and lateral projections, the guidewire was measured and appropriate sized lag screw was selected.  The channel for the compression screw was drilled and antirotation bar was placed.  Lag screw was drilled and placed in appropriate position.  Compression screw was then placed.  Appropriate compression was achieved.  The set screw was locked in place. Again, the adequacy of hardware position and fracture reduction was verified fluoroscopically in AP and lateral projections.   Attention was then turned to the distal interlocking screw in the diaphysis. Using a targeted assembly, a stab incision was made and hole was drilled through the nail. An interlocking screw was placed with excellent purchase.  Appropriate screw position was verified fluoroscopically in AP and lateral projections.   The wounds were irrigated thoroughly with sterile saline solution. Local anesthetic was injected into the wounds. Deep fascia was closed with 0-Vicryl. The subcutaneous tissues were closed using 2-0 Vicryl interrupted sutures. The skin was closed using staples. Sterile occlusive dressings were  applied to all wounds. The patient was then transferred to the recovery room in satisfactory condition.   POSTOPERATIVE PLAN: The patient will be WBAT on the operative extremity. Lovenox  for DVT ppx x 4 weeks to start on POD#1. Perioperative IV antibiotics x 24 hours. PT/OT on POD#1.

## 2024-03-25 NOTE — Anesthesia Preprocedure Evaluation (Addendum)
 Anesthesia Evaluation  Patient identified by MRN, date of birth, ID band Patient awake    Reviewed: Allergy & Precautions, NPO status , Patient's Chart, lab work & pertinent test results  History of Anesthesia Complications Negative for: history of anesthetic complications  Airway Mallampati: III  TM Distance: >3 FB Neck ROM: full    Dental  (+) Poor Dentition, Chipped, Missing   Pulmonary COPD,  COPD inhaler, former smoker   Pulmonary exam normal        Cardiovascular hypertension, On Medications Normal cardiovascular exam  ECHO  IMPRESSIONS     1. Left ventricular ejection fraction, by estimation, is 65 to 70%. The  left ventricle has normal function. The left ventricle has no regional  wall motion abnormalities. There is mild left ventricular hypertrophy.  Left ventricular diastolic parameters  are consistent with Grade I diastolic dysfunction (impaired relaxation).  Elevated left atrial pressure.   2. Right ventricular systolic function is normal. The right ventricular  size is normal. There is normal pulmonary artery systolic pressure.   3. Left atrial size was mildly dilated.   4. A small pericardial effusion is present. There is no evidence of  cardiac tamponade.   5. The mitral valve is degenerative. Trivial mitral valve regurgitation.  No evidence of mitral stenosis.   6. The aortic valve has an indeterminant number of cusps. Aortic valve  regurgitation is not visualized. No aortic stenosis is present.   7. The inferior vena cava is normal in size with greater than 50%  respiratory variability, suggesting right atrial pressure of 3 mmHg.     Neuro/Psych  Neuromuscular disease  negative psych ROS   GI/Hepatic Neg liver ROS,GERD  Medicated,,  Endo/Other  diabetesHypothyroidism    Renal/GU CRFRenal disease     Musculoskeletal   Abdominal   Peds  Hematology  (+) Blood dyscrasia, anemia   Anesthesia  Other Findings Past Medical History: No date: Arthritis No date: Cancer (HCC) No date: Chronic kidney disease No date: COPD (chronic obstructive pulmonary disease) (HCC) No date: Diabetes mellitus without complication (HCC)     Comment:  type 2 No date: Hypertension No date: Varicose veins of lower extremities with other complications  Past Surgical History: 1972: ABDOMINAL HYSTERECTOMY No date: BLADDER SUSPENSION 1999: BREAST EXCISIONAL BIOPSY; Right     Comment:  neg 1975: CHOLECYSTECTOMY No date: COLONOSCOPY 05/24/2015: COLONOSCOPY WITH PROPOFOL ; N/A     Comment:  Procedure: COLONOSCOPY WITH PROPOFOL ;  Surgeon: Deward CINDERELLA Piedmont, MD;  Location: [facility] ENDOSCOPY;  Service:               Gastroenterology;  Laterality: N/A; 1972: CYSTOSCOPY 05/24/2015: ESOPHAGOGASTRODUODENOSCOPY; N/A     Comment:  Procedure: ESOPHAGOGASTRODUODENOSCOPY (EGD);  Surgeon:               Deward CINDERELLA Piedmont, MD;  Location: [facility] ENDOSCOPY;  Service:               Gastroenterology;  Laterality: N/A; No date: FRACTURE SURGERY 08/28/2023: IR KYPHO THORACIC WITH BONE BIOPSY 2013: JOINT REPLACEMENT; Right     Comment:  hip No date: salpingo oophorectmy  1969: THYROIDECTOMY No date: WRIST FRACTURE SURGERY; Left  BMI    Body Mass Index: 18.04 kg/m      Reproductive/Obstetrics negative OB ROS  Anesthesia Physical Anesthesia Plan  ASA: 3  Anesthesia Plan: General ETT   Post-op Pain Management: Ofirmev  IV (intra-op)*, Toradol  IV (intra-op)* and Dilaudid  IV   Induction: Intravenous  PONV Risk Score and Plan: 3 and Ondansetron , Dexamethasone  and Treatment may vary due to age or medical condition  Airway Management Planned: Oral ETT  Additional Equipment:   Intra-op Plan:   Post-operative Plan: Extubation in OR  Informed Consent: I have reviewed the patients History and Physical, chart, labs and discussed the procedure including the risks,  benefits and alternatives for the proposed anesthesia with the patient or authorized representative who has indicated his/her understanding and acceptance.     Dental Advisory Given  Plan Discussed with: Anesthesiologist, CRNA and Surgeon  Anesthesia Plan Comments: (Patient consented for risks of anesthesia including but not limited to:  - adverse reactions to medications - damage to eyes, teeth, lips or other oral mucosa - nerve damage due to positioning  - sore throat or hoarseness - Damage to heart, brain, nerves, lungs, other parts of body or loss of life  Patient voiced understanding and assent.)         Anesthesia Quick Evaluation

## 2024-03-25 NOTE — Consult Note (Signed)
 ORTHOPAEDIC CONSULTATION  REQUESTING PHYSICIAN: Laurita Cort DASEN, MD  Chief Complaint:   L hip pain  History of Present Illness: Robyn Butler is a 89 y.o. female with a past medical history significant for stage III CKD, COPD, HTN, HLD, diabetes, and hypothyroidism who had a fall earlier yesterday.  The patient noted immediate hip pain and inability to ambulate.  The patient ambulates with a walker at baseline.  The patient lives alone at her home. Pain is worse with any sort of movement.  X-rays in the emergency department show a left intertrochanteric hip fracture.  She is also noted to have a laceration about her scalp which was sutured in the emergency department. Patient's daughter is at the bedside.   Past Medical History:  Diagnosis Date   Arthritis    Cancer (HCC)    Chronic kidney disease    COPD (chronic obstructive pulmonary disease) (HCC)    Diabetes mellitus without complication (HCC)    type 2   Hypertension    Varicose veins of lower extremities with other complications    Past Surgical History:  Procedure Laterality Date   ABDOMINAL HYSTERECTOMY  1972   BLADDER SUSPENSION     BREAST EXCISIONAL BIOPSY Right 1999   neg   CHOLECYSTECTOMY  1975   COLONOSCOPY     COLONOSCOPY WITH PROPOFOL  N/A 05/24/2015   Procedure: COLONOSCOPY WITH PROPOFOL ;  Surgeon: Deward CINDERELLA Piedmont, MD;  Location: [facility] ENDOSCOPY;  Service: Gastroenterology;  Laterality: N/A;   CYSTOSCOPY  1972   ESOPHAGOGASTRODUODENOSCOPY N/A 05/24/2015   Procedure: ESOPHAGOGASTRODUODENOSCOPY (EGD);  Surgeon: Deward CINDERELLA Piedmont, MD;  Location: [facility] ENDOSCOPY;  Service: Gastroenterology;  Laterality: N/A;   FRACTURE SURGERY     IR KYPHO THORACIC WITH BONE BIOPSY  08/28/2023   JOINT REPLACEMENT Right 2013   hip   salpingo oophorectmy      THYROIDECTOMY  1969   WRIST FRACTURE SURGERY Left    Social History   Socioeconomic History   Marital status: Widowed    Spouse  name: Not on file   Number of children: 2   Years of education: 9th Grade   Highest education level: 9th grade  Occupational History   Occupation: Retired  Tobacco Use   Smoking status: Former    Current packs/day: 0.00    Types: Cigarettes    Quit date: 07/18/1991    Years since quitting: 32.7   Smokeless tobacco: Never  Vaping Use   Vaping status: Never Used  Substance and Sexual Activity   Alcohol use: No   Drug use: No   Sexual activity: Not Currently  Other Topics Concern   Not on file  Social History Narrative   In [place] in senior place; quit smoking [> 25 years ago]; no alcohol; hosiery.    Social Drivers of Health   Financial Resource Strain: Patient Declined (12/25/2023)   Received from [facility]   Overall Financial Resource Strain (CARDIA)    Difficulty of Paying Living Expenses: Patient declined  Food Insecurity: Patient Declined (12/25/2023)   Received from [facility]   Hunger Vital Sign    Within the past 12 months, you worried that your food would run out before you got the money to buy more.: Patient declined    Within the past 12 months, the food you bought just didn't last and you didn't have money to get more.: Patient declined  Transportation Needs: Unknown (12/25/2023)   Received from [facility]   PRAPARE -  Transportation    In the past 12 months, has lack of transportation kept you from medical appointments or from getting medications?: No    Lack of Transportation (Non-Medical): Patient declined  Physical Activity: Inactive (12/24/2018)   Exercise Vital Sign    Days of Exercise per Week: 0 days    Minutes of Exercise per Session: 0 min  Stress: No Stress Concern Present (12/21/2017)   [employer] of Occupational Health - Occupational Stress Questionnaire    Feeling of Stress : Not at all  Social Connections: Socially Isolated (08/25/2023)   Social Connection and Isolation Panel     Frequency of Communication with Friends and Family: Once a week    Frequency of Social Gatherings with Friends and Family: Once a week    Attends Religious Services: 1 to 4 times per year    Active Member of [employer] or Organizations: No    Attends Banker Meetings: Never    Marital Status: Widowed   Family History  Problem Relation Age of Onset   Heart attack Father    Asthma Father    Kidney disease Mother    Hypertension Mother    Hypertension Other    Diabetes Other    Heart attack Other    Breast cancer Neg Hx    Allergies  Allergen Reactions   Alendronate Nausea And Vomiting    Other reaction(s): Vomiting   Prior to Admission medications   Medication Sig Start Date End Date Taking? Authorizing Provider  albuterol  (VENTOLIN  HFA) 108 (90 Base) MCG/ACT inhaler Inhale 2 puffs into the lungs every 6 (six) hours as needed for wheezing or shortness of breath. 08/18/23  Yes Ray, Nilsa, MD  amLODipine  (NORVASC ) 5 MG tablet Take 5 mg by mouth daily. 10/16/23  Yes [provider]  aspirin  EC 81 MG tablet Take 81 mg by mouth daily.    Yes [provider]  Cholecalciferol  (VITAMIN D3) 50 MCG (2000 UT) TABS Take 1 tablet by mouth daily.   Yes [provider]  diphenhydramine-acetaminophen  (TYLENOL  PM) 25-500 MG TABS tablet Take 1 tablet by mouth at bedtime as needed.   Yes [provider]  dorzolamide  (TRUSOPT ) 2 % ophthalmic solution Place 1 drop into both eyes 2 (two) times daily.    Yes [provider]  DULoxetine  (CYMBALTA ) 30 MG capsule Take 30 mg by mouth daily. 07/26/23 07/25/24 Yes [provider]  latanoprost  (XALATAN ) 0.005 % ophthalmic solution Place 1 drop into both eyes at bedtime.    Yes [provider]  levothyroxine  (SYNTHROID ) 100 MCG tablet TAKE 1 TABLET BY MOUTH EVERY DAY 03/22/19  Yes Gasper Nancyann BRAVO, MD  loperamide  (IMODIUM ) 2 MG capsule Take 2 mg by mouth as needed for diarrhea or loose stools.   Yes  [provider]  lovastatin (MEVACOR) 40 MG tablet TAKE 1 TABLET BY MOUTH AT BEDTIME 08/28/19  Yes Gasper Nancyann BRAVO, MD  magnesium  oxide (MAG-OX) 400 MG tablet Take 400 mg by mouth daily.   Yes [provider]  Multiple Vitamins-Minerals (PRESERVISION AREDS 2) CAPS Take 1 capsule by mouth 2 (two) times daily.   Yes [provider]  nortriptyline  (PAMELOR ) 25 MG capsule TAKE 1 CAPSULE(25 MG) BY MOUTH AT BEDTIME 08/16/19  Yes Gasper Nancyann BRAVO, MD  omeprazole  (PRILOSEC) 40 MG capsule Take 1 capsule (40 mg total) by mouth daily. 09/17/18  Yes Gasper Nancyann BRAVO, MD  oxybutynin  (DITROPAN -XL) 5 MG 24 hr tablet Take 5 mg by mouth daily. 08/06/23  Yes [provider]  pregabalin  (LYRICA ) 50 MG capsule Take 50 mg by mouth 2 (two) times daily. 07/17/23 07/16/24 Yes [provider]  SPIRIVA  RESPIMAT 2.5 MCG/ACT AERS SMARTSIG:2 Puff(s) By Mouth Daily 06/09/19  Yes [provider]  valsartan -hydrochlorothiazide  (DIOVAN -HCT) 160-25 MG tablet Take 1 tablet by mouth daily.   Yes [provider]  zinc sulfate 220 (50 Zn) MG capsule Take 220 mg by mouth daily.   Yes [provider]  Cranberry 500 MG CAPS Take 500 mg by mouth daily. Patient not taking: Reported on 03/25/2024    [provider]  cyanocobalamin  1000 MCG tablet Take 1,000 mcg by mouth daily. Patient not taking: Reported on 03/25/2024    [provider]  ibandronate  (BONIVA ) 150 MG tablet TAKE ONE TABLET ONCE A MONTH FIRST THING IN THE MORNING AT LEAST 1 HOUR BEFORE EATING, TAKE WITH WATER. Patient not taking: No sig reported 03/07/19   Gasper Nancyann BRAVO, MD  Polyethylene Glycol 3350  (DULCOLAX BALANCE PO) Take 2 tablets by mouth as needed. Patient not taking: Reported on 03/25/2024    [provider]   Recent Labs    03/24/24 2358  WBC 14.3*  HGB 10.3*  HCT 34.6*  PLT 266  K 4.7  CL 102  CO2 26  BUN 25*  CREATININE 0.95  GLUCOSE 150*  CALCIUM  8.8*  INR 1.1    CT Head Wo Contrast Result Date: 03/24/2024 EXAM: CT HEAD AND CERVICAL SPINE 03/24/2024 10:50:09 PM TECHNIQUE: CT of the head and cervical spine was performed without the administration of intravenous contrast. Multiplanar reformatted images are provided for review. Automated exposure control, iterative reconstruction, and/or weight based adjustment of the mA/kV was utilized to reduce the radiation dose to as low as reasonably achievable. COMPARISON: CT cervical spine 08/03/2021 and CT head 08/03/2021 CLINICAL HISTORY: Fall. Patient arrives via EMS from home for fall; laceration to left side of head and left hip/thigh pain; no LOC. FINDINGS: CT HEAD BRAIN AND VENTRICLES: No acute intracranial hemorrhage. No mass effect or midline shift. No abnormal extra-axial fluid collection. No evidence of acute infarct. No hydrocephalus. Chronic microvascular ischemia and generalized atrophy. ORBITS: No acute abnormality. SINUSES AND MASTOIDS: No acute abnormality. SOFT TISSUES AND SKULL: Left posterolateral scalp hematoma. No calvarial fracture. CT CERVICAL SPINE BONES AND ALIGNMENT: No acute fracture or traumatic malalignment. DEGENERATIVE CHANGES: Posterior disc bulge at C3-C4 causes moderate effacement of the thecal sac. Degenerative change at the atlantoaxial joint is similar to prior. SOFT TISSUES: No prevertebral soft tissue swelling. Emphysema in the visualized upper lungs. IMPRESSION: 1. No acute intracranial abnormality. 2. Left posterolateral scalp hematoma. No calvarial fracture. 3.  No acute fracture or traumatic malalignment of the cervical spine. Electronically signed by: Norman Gatlin MD 03/24/2024 11:04 PM EDT RP Workstation: [id number]   CT Cervical Spine Wo Contrast Result Date: 03/24/2024 EXAM: CT HEAD AND CERVICAL SPINE 03/24/2024 10:50:09 PM TECHNIQUE: CT of the head and cervical spine was performed without the administration of intravenous contrast. Multiplanar reformatted images are provided for  review. Automated exposure control, iterative reconstruction, and/or weight based adjustment of the mA/kV was utilized to reduce the radiation dose to as low as reasonably achievable. COMPARISON: CT cervical spine 08/03/2021 and CT head 08/03/2021 CLINICAL HISTORY: Fall. Patient arrives via EMS from home for fall; laceration to left side of head and left hip/thigh pain; no LOC. FINDINGS: CT HEAD BRAIN AND VENTRICLES: No acute intracranial hemorrhage. No mass effect or midline shift. No abnormal extra-axial fluid collection. No  evidence of acute infarct. No hydrocephalus. Chronic microvascular ischemia and generalized atrophy. ORBITS: No acute abnormality. SINUSES AND MASTOIDS: No acute abnormality. SOFT TISSUES AND SKULL: Left posterolateral scalp hematoma. No calvarial fracture. CT CERVICAL SPINE BONES AND ALIGNMENT: No acute fracture or traumatic malalignment. DEGENERATIVE CHANGES: Posterior disc bulge at C3-C4 causes moderate effacement of the thecal sac. Degenerative change at the atlantoaxial joint is similar to prior. SOFT TISSUES: No prevertebral soft tissue swelling. Emphysema in the visualized upper lungs. IMPRESSION: 1. No acute intracranial abnormality. 2. Left posterolateral scalp hematoma. No calvarial fracture. 3.  No acute fracture or traumatic malalignment of the cervical spine. Electronically signed by: Norman Gatlin MD 03/24/2024 11:04 PM EDT RP Workstation: [id number]   DG Femur Min 2 Views Left Result Date: 03/24/2024 EXAM: 2 VIEW(S) XRAY OF THE LEFT FEMUR 03/24/2024 10:39:56 PM COMPARISON: None available. CLINICAL HISTORY: Fall. Pt to ED via EMS from home, pt reports she fell tonight injuring her left hip/femur, pt also has laceration to side of left head, wrapped and bleeding controlled. FINDINGS: BONES AND JOINTS: Acute intertrochanteric left femoral fracture with impaction and varus angulation of distal fracture fragment. SOFT TISSUES: Vascular calcifications. IMPRESSION: 1. Acute  intertrochanteric left femur fracture with impaction and varus angulation of distal fracture fragment. Electronically signed by: Donnice Mania MD 03/24/2024 10:50 PM EDT RP Workstation: [id number]   DG Chest 1 View Result Date: 03/24/2024 CLINICAL DATA:  Fall EXAM: CHEST  1 VIEW COMPARISON:  08/29/2023 FINDINGS: Skin fold artifact over the left chest. No acute airspace disease, pleural effusion or pneumothorax. Scarring or atelectasis left base. Borderline cardiac enlargement. Aortic atherosclerosis. IMPRESSION: No active disease. Borderline cardiomegaly. Electronically Signed   By: Luke Bun M.D.   On: 03/24/2024 22:49   DG Hip Unilat W or Wo Pelvis 2-3 Views Left Result Date: 03/24/2024 EXAM: 2 or more VIEW(S) XRAY OF THE LEFT HIP 03/24/2024 10:39:56 PM COMPARISON: None available. CLINICAL HISTORY: Fall. Pt to ED via EMS from home, pt reports she fell tonight injuring her left hip/femur, pt also has laceration to side of left head, wrapped and bleeding controlled. FINDINGS: BONES AND JOINTS: Comminuted intertrochanteric left femur fracture with varus angulation. Total right hip arthroplasty in place. Multilevel degenerative changes of the spine. SOFT TISSUES: The soft tissues are unremarkable. Vascular calcifications. OTHER: Cholecystectomy clips noted. IMPRESSION: 1. Comminuted intertrochanteric left femur fracture with varus angulation. Electronically signed by: Donnice Mania MD 03/24/2024 10:47 PM EDT RP Workstation: [id number]     Positive ROS: All other systems have been reviewed and were otherwise negative with the exception of those mentioned in the HPI and as above.  Physical Exam: BP (!) 175/64   Pulse 83   Temp 97.7 F (36.5 C) (Oral)   Resp 18   Ht 5' 3 (1.6 m)   Wt 46.2 kg   SpO2 100%   BMI 18.04 kg/m  General:  Alert, no acute distress Psychiatric:  Patient is competent for consent with normal mood and affect    Orthopedic Exam:  LLE: + DF/PF/EHL SILT grossly over  foot Foot wwp +Log roll/axial load   Imaging:  As above: L intertrochanteric hip fracture  Assessment/Plan: Sadie B Soderquist is a 89 y.o. female with a L intertrochanteric hip fracture  1. I discussed the various treatment options including both surgical and non-surgical management of the fracture with the patient and/or family (medical PoA). We discussed the high risk of perioperative complications due to patient's age and other co-morbidities. After discussion of risks, benefits, and  alternatives to surgery, the family and/or patient were in agreement to proceed with surgery. The goals of surgery would be to provide adequate pain relief and allow for mobilization. Plan for surgery is L hip cephalomedullary nailing today, 03/25/2024. 2. NPO until OR 3. Hold anticoagulation in advance of OR   Earnestine Blanch   03/25/2024 11:06 AM

## 2024-03-25 NOTE — H&P (Signed)
 History and Physical    Robyn Butler FMW:[id number] DOB: March 14, 1929 DOA: 03/24/2024  PCP: Lenon Layman ORN, MD (Confirm with patient/family/NH records and if not entered, this has to be entered at [facility] point of entry) Patient coming from: Home   I have personally briefly reviewed patient's old medical records in [facility]  Chief Complaint: I fell and broke my hip  HPI: Robyn Butler is a 89 y.o. female with medical history significant of COPD, HTN, HLD, hypothyroidism, CKD stage IIIa, diet-controlled diabetes, osteoporosis, presented with mechanical fall at home and left hip fracture.  Patient lives by herself and uses roller walker to ambulate.  Last night, patient fell after tumbling and tripped her foot at home and fell to the left side and hit left hip and left forehead.  No LOC.  Denied any prodromes of chest pain shortness of breath palpitations lightheadedness.  With excruciating pain she was not able to stand up again and called EMS. ED Course: Afebrile, blood pressure elevated SBP 170-180, x-ray showed left comminuted intertrochanter femoral fracture, CT head showed left forehead hematoma, and laceration.  Stitches applied in the ED.  Blood work showed WBC 14.3 hemoglobin 10.3 BUN 25 creatinine 0.9 glucose 150.  Review of Systems: As per HPI otherwise 14 point review of systems negative.    Past Medical History:  Diagnosis Date   Arthritis    Cancer (HCC)    Chronic kidney disease    COPD (chronic obstructive pulmonary disease) (HCC)    Diabetes mellitus without complication (HCC)    type 2   Hypertension    Varicose veins of lower extremities with other complications     Past Surgical History:  Procedure Laterality Date   ABDOMINAL HYSTERECTOMY  1972   BLADDER SUSPENSION     BREAST EXCISIONAL BIOPSY Right 1999   neg   CHOLECYSTECTOMY  1975   COLONOSCOPY     COLONOSCOPY WITH PROPOFOL  N/A 05/24/2015   Procedure: COLONOSCOPY WITH PROPOFOL ;  Surgeon: Deward CINDERELLA Piedmont,  MD;  Location: [facility] ENDOSCOPY;  Service: Gastroenterology;  Laterality: N/A;   CYSTOSCOPY  1972   ESOPHAGOGASTRODUODENOSCOPY N/A 05/24/2015   Procedure: ESOPHAGOGASTRODUODENOSCOPY (EGD);  Surgeon: Deward CINDERELLA Piedmont, MD;  Location: [facility] ENDOSCOPY;  Service: Gastroenterology;  Laterality: N/A;   FRACTURE SURGERY     IR KYPHO THORACIC WITH BONE BIOPSY  08/28/2023   JOINT REPLACEMENT Right 2013   hip   salpingo oophorectmy      THYROIDECTOMY  1969   WRIST FRACTURE SURGERY Left      reports that she quit smoking about 32 years ago. Her smoking use included cigarettes. She has never used smokeless tobacco. She reports that she does not drink alcohol and does not use drugs.  Allergies  Allergen Reactions   Alendronate Nausea And Vomiting    Other reaction(s): Vomiting    Family History  Problem Relation Age of Onset   Heart attack Father    Asthma Father    Kidney disease Mother    Hypertension Mother    Hypertension Other    Diabetes Other    Heart attack Other    Breast cancer Neg Hx      Prior to Admission medications   Medication Sig Start Date End Date Taking? Authorizing Provider  albuterol  (VENTOLIN  HFA) 108 (90 Base) MCG/ACT inhaler Inhale 2 puffs into the lungs every 6 (six) hours as needed for wheezing or shortness of breath. 08/18/23  Yes Levander Slate, MD  amLODipine  (NORVASC ) 5 MG tablet  Take 5 mg by mouth daily. 10/16/23  Yes [provider]  aspirin  EC 81 MG tablet Take 81 mg by mouth daily.    Yes [provider]  Cholecalciferol  (VITAMIN D3) 50 MCG (2000 UT) TABS Take 1 tablet by mouth daily.   Yes [provider]  diphenhydramine-acetaminophen  (TYLENOL  PM) 25-500 MG TABS tablet Take 1 tablet by mouth at bedtime as needed.   Yes [provider]  dorzolamide  (TRUSOPT ) 2 % ophthalmic solution Place 1 drop into both eyes 2 (two) times daily.    Yes [provider]  DULoxetine  (CYMBALTA ) 30 MG capsule Take 30 mg by mouth daily. 07/26/23 07/25/24  Yes [provider]  latanoprost  (XALATAN ) 0.005 % ophthalmic solution Place 1 drop into both eyes at bedtime.    Yes [provider]  levothyroxine  (SYNTHROID ) 100 MCG tablet TAKE 1 TABLET BY MOUTH EVERY DAY 03/22/19  Yes Gasper Nancyann BRAVO, MD  loperamide  (IMODIUM ) 2 MG capsule Take 2 mg by mouth as needed for diarrhea or loose stools.   Yes [provider]  lovastatin (MEVACOR) 40 MG tablet TAKE 1 TABLET BY MOUTH AT BEDTIME 08/28/19  Yes Gasper Nancyann BRAVO, MD  magnesium  oxide (MAG-OX) 400 MG tablet Take 400 mg by mouth daily.   Yes [provider]  Multiple Vitamins-Minerals (PRESERVISION AREDS 2) CAPS Take 1 capsule by mouth 2 (two) times daily.   Yes [provider]  nortriptyline  (PAMELOR ) 25 MG capsule TAKE 1 CAPSULE(25 MG) BY MOUTH AT BEDTIME 08/16/19  Yes Gasper Nancyann BRAVO, MD  omeprazole  (PRILOSEC) 40 MG capsule Take 1 capsule (40 mg total) by mouth daily. 09/17/18  Yes Gasper Nancyann BRAVO, MD  oxybutynin  (DITROPAN -XL) 5 MG 24 hr tablet Take 5 mg by mouth daily. 08/06/23  Yes [provider]  pregabalin  (LYRICA ) 50 MG capsule Take 50 mg by mouth 2 (two) times daily. 07/17/23 07/16/24 Yes [provider]  SPIRIVA  RESPIMAT 2.5 MCG/ACT AERS SMARTSIG:2 Puff(s) By Mouth Daily 06/09/19  Yes [provider]  valsartan -hydrochlorothiazide  (DIOVAN -HCT) 160-25 MG tablet Take 1 tablet by mouth daily.   Yes [provider]  zinc sulfate 220 (50 Zn) MG capsule Take 220 mg by mouth daily.   Yes [provider]  Cranberry 500 MG CAPS Take 500 mg by mouth daily. Patient not taking: Reported on 03/25/2024    [provider]  cyanocobalamin  1000 MCG tablet Take 1,000 mcg by mouth daily. Patient not taking: Reported on 03/25/2024    [provider]  ibandronate  (BONIVA ) 150 MG tablet TAKE ONE TABLET ONCE A MONTH FIRST THING IN THE MORNING AT LEAST 1 HOUR BEFORE EATING, TAKE WITH WATER. Patient not taking: No sig  reported 03/07/19   Gasper Nancyann BRAVO, MD  Polyethylene Glycol 3350  (DULCOLAX BALANCE PO) Take 2 tablets by mouth as needed. Patient not taking: Reported on 03/25/2024    [provider]    Physical Exam: Vitals:   03/25/24 0630 03/25/24 0701 03/25/24 0800 03/25/24 0830  BP: (!) 162/68  (!) 178/64 (!) 152/60  Pulse: 82  85 81  Resp: 11  16 14   Temp:  97.7 F (36.5 C)    TempSrc:  Oral    SpO2: 99%  100% 99%  Weight:      Height:        Constitutional: NAD, calm, comfortable Vitals:   03/25/24 0630 03/25/24 0701 03/25/24 0800 03/25/24 0830  BP: (!) 162/68  (!) 178/64 (!) 152/60  Pulse: 82  85 81  Resp: 11  16 14   Temp:  97.7 F (36.5 C)    TempSrc:  Oral    SpO2: 99%  100% 99%  Weight:      Height:       Eyes: PERRL, lids and conjunctivae normal ENMT: Mucous membranes are moist. Posterior pharynx clear of any exudate or lesions.Normal dentition.  Neck: normal, supple, no masses, no thyromegaly Respiratory: clear to auscultation bilaterally, no wheezing, no crackles. Normal respiratory effort. No accessory muscle use.  Cardiovascular: Regular rate and rhythm, no murmurs / rubs / gallops. No extremity edema. 2+ pedal pulses. No carotid bruits.  Abdomen: no tenderness, no masses palpated. No hepatosplenomegaly. Bowel sounds positive.  Musculoskeletal: Left leg shortened and rotated Skin: no rashes, lesions, ulcers. No induration Neurologic: CN 2-12 grossly intact. Sensation intact, DTR normal. Strength 5/5 in all 4.  Psychiatric: Normal judgment and insight. Alert and oriented x 3. Normal mood.     Labs on Admission: I have personally reviewed following labs and imaging studies  CBC: Recent Labs  Lab 03/24/24 2358  WBC 14.3*  NEUTROABS 12.0*  HGB 10.3*  HCT 34.6*  MCV 100.9*  PLT 266   Basic Metabolic Panel: Recent Labs  Lab 03/24/24 2358  NA 139  K 4.7  CL 102  CO2 26  GLUCOSE 150*  BUN 25*  CREATININE 0.95  CALCIUM  8.8*   GFR: Estimated  Creatinine Clearance: 27 mL/min (by C-G formula based on SCr of 0.95 mg/dL). Liver Function Tests: No results for input(s): AST, ALT, ALKPHOS, BILITOT, PROT, ALBUMIN  in the last 168 hours. No results for input(s): LIPASE, AMYLASE in the last 168 hours. No results for input(s): AMMONIA in the last 168 hours. Coagulation Profile: Recent Labs  Lab 03/24/24 2358  INR 1.1   Cardiac Enzymes: No results for input(s): CKTOTAL, CKMB, CKMBINDEX, TROPONINI in the last 168 hours. BNP (last 3 results) No results for input(s): PROBNP in the last 8760 hours. HbA1C: No results for input(s): HGBA1C in the last 72 hours. CBG: No results for input(s): GLUCAP in the last 168 hours. Lipid Profile: No results for input(s): CHOL, HDL, LDLCALC, TRIG, CHOLHDL, LDLDIRECT in the last 72 hours. Thyroid  Function Tests: No results for input(s): TSH, T4TOTAL, FREET4, T3FREE, THYROIDAB in the last 72 hours. Anemia Panel: No results for input(s): VITAMINB12, FOLATE, FERRITIN, TIBC, IRON , RETICCTPCT in the last 72 hours. Urine analysis:    Component Value Date/Time   COLORURINE YELLOW (A) 08/25/2023 1213   APPEARANCEUR HAZY (A) 08/25/2023 1213   APPEARANCEUR Hazy 11/08/2012 1006   LABSPEC 1.009 08/25/2023 1213   LABSPEC 1.015 11/08/2012 1006   PHURINE 5.0 08/25/2023 1213   GLUCOSEU NEGATIVE 08/25/2023 1213   GLUCOSEU Negative 11/08/2012 1006   HGBUR NEGATIVE 08/25/2023 1213   BILIRUBINUR NEGATIVE 08/25/2023 1213   BILIRUBINUR Negative 11/08/2012 1006   KETONESUR NEGATIVE 08/25/2023 1213   PROTEINUR NEGATIVE 08/25/2023 1213   NITRITE NEGATIVE 08/25/2023 1213   LEUKOCYTESUR NEGATIVE 08/25/2023 1213   LEUKOCYTESUR Trace 11/08/2012 1006    Radiological Exams on Admission: CT Head Wo Contrast Result Date: 03/24/2024 EXAM: CT HEAD AND CERVICAL SPINE 03/24/2024 10:50:09 PM TECHNIQUE: CT of the head and cervical spine was performed without the  administration of intravenous contrast. Multiplanar reformatted images are provided for review. Automated exposure control, iterative reconstruction, and/or weight based adjustment of the mA/kV was utilized to reduce the radiation dose to as low as reasonably achievable. COMPARISON: CT cervical spine 08/03/2021 and CT head 08/03/2021 CLINICAL HISTORY: Fall. Patient arrives via  EMS from home for fall; laceration to left side of head and left hip/thigh pain; no LOC. FINDINGS: CT HEAD BRAIN AND VENTRICLES: No acute intracranial hemorrhage. No mass effect or midline shift. No abnormal extra-axial fluid collection. No evidence of acute infarct. No hydrocephalus. Chronic microvascular ischemia and generalized atrophy. ORBITS: No acute abnormality. SINUSES AND MASTOIDS: No acute abnormality. SOFT TISSUES AND SKULL: Left posterolateral scalp hematoma. No calvarial fracture. CT CERVICAL SPINE BONES AND ALIGNMENT: No acute fracture or traumatic malalignment. DEGENERATIVE CHANGES: Posterior disc bulge at C3-C4 causes moderate effacement of the thecal sac. Degenerative change at the atlantoaxial joint is similar to prior. SOFT TISSUES: No prevertebral soft tissue swelling. Emphysema in the visualized upper lungs. IMPRESSION: 1. No acute intracranial abnormality. 2. Left posterolateral scalp hematoma. No calvarial fracture. 3.  No acute fracture or traumatic malalignment of the cervical spine. Electronically signed by: Norman Gatlin MD 03/24/2024 11:04 PM EDT RP Workstation: [id number]   CT Cervical Spine Wo Contrast Result Date: 03/24/2024 EXAM: CT HEAD AND CERVICAL SPINE 03/24/2024 10:50:09 PM TECHNIQUE: CT of the head and cervical spine was performed without the administration of intravenous contrast. Multiplanar reformatted images are provided for review. Automated exposure control, iterative reconstruction, and/or weight based adjustment of the mA/kV was utilized to reduce the radiation dose to as low as reasonably  achievable. COMPARISON: CT cervical spine 08/03/2021 and CT head 08/03/2021 CLINICAL HISTORY: Fall. Patient arrives via EMS from home for fall; laceration to left side of head and left hip/thigh pain; no LOC. FINDINGS: CT HEAD BRAIN AND VENTRICLES: No acute intracranial hemorrhage. No mass effect or midline shift. No abnormal extra-axial fluid collection. No evidence of acute infarct. No hydrocephalus. Chronic microvascular ischemia and generalized atrophy. ORBITS: No acute abnormality. SINUSES AND MASTOIDS: No acute abnormality. SOFT TISSUES AND SKULL: Left posterolateral scalp hematoma. No calvarial fracture. CT CERVICAL SPINE BONES AND ALIGNMENT: No acute fracture or traumatic malalignment. DEGENERATIVE CHANGES: Posterior disc bulge at C3-C4 causes moderate effacement of the thecal sac. Degenerative change at the atlantoaxial joint is similar to prior. SOFT TISSUES: No prevertebral soft tissue swelling. Emphysema in the visualized upper lungs. IMPRESSION: 1. No acute intracranial abnormality. 2. Left posterolateral scalp hematoma. No calvarial fracture. 3.  No acute fracture or traumatic malalignment of the cervical spine. Electronically signed by: Norman Gatlin MD 03/24/2024 11:04 PM EDT RP Workstation: [id number]   DG Femur Min 2 Views Left Result Date: 03/24/2024 EXAM: 2 VIEW(S) XRAY OF THE LEFT FEMUR 03/24/2024 10:39:56 PM COMPARISON: None available. CLINICAL HISTORY: Fall. Pt to ED via EMS from home, pt reports she fell tonight injuring her left hip/femur, pt also has laceration to side of left head, wrapped and bleeding controlled. FINDINGS: BONES AND JOINTS: Acute intertrochanteric left femoral fracture with impaction and varus angulation of distal fracture fragment. SOFT TISSUES: Vascular calcifications. IMPRESSION: 1. Acute intertrochanteric left femur fracture with impaction and varus angulation of distal fracture fragment. Electronically signed by: Donnice Mania MD 03/24/2024 10:50 PM EDT RP  Workstation: [id number]   DG Chest 1 View Result Date: 03/24/2024 CLINICAL DATA:  Fall EXAM: CHEST  1 VIEW COMPARISON:  08/29/2023 FINDINGS: Skin fold artifact over the left chest. No acute airspace disease, pleural effusion or pneumothorax. Scarring or atelectasis left base. Borderline cardiac enlargement. Aortic atherosclerosis. IMPRESSION: No active disease. Borderline cardiomegaly. Electronically Signed   By: Luke Bun M.D.   On: 03/24/2024 22:49   DG Hip Unilat W or Wo Pelvis 2-3 Views Left Result Date: 03/24/2024 EXAM: 2 or more VIEW(S) XRAY  OF THE LEFT HIP 03/24/2024 10:39:56 PM COMPARISON: None available. CLINICAL HISTORY: Fall. Pt to ED via EMS from home, pt reports she fell tonight injuring her left hip/femur, pt also has laceration to side of left head, wrapped and bleeding controlled. FINDINGS: BONES AND JOINTS: Comminuted intertrochanteric left femur fracture with varus angulation. Total right hip arthroplasty in place. Multilevel degenerative changes of the spine. SOFT TISSUES: The soft tissues are unremarkable. Vascular calcifications. OTHER: Cholecystectomy clips noted. IMPRESSION: 1. Comminuted intertrochanteric left femur fracture with varus angulation. Electronically signed by: Donnice Mania MD 03/24/2024 10:47 PM EDT RP Workstation: [id number]    EKG: Independently reviewed.  Sinus rhythm, no acute ST changes.  Assessment/Plan Principal Problem:   Closed left hip fracture (HCC) Active Problems:   Closed comminuted intertrochanteric fracture of proximal end of left femur (HCC)   Hip fracture (HCC)  (please populate well all problems here in Problem List. (For example, if patient is on BP meds at home and you resume or decide to hold them, it is a problem that needs to be her. Same for CAD, COPD, HLD and so on)  Left intertrochanteric femoral fracture - Secondary to mechanical fall - ORIF this afternoon - Calculated perioperative major cardiac risk 0.5%, medically cleared for  incoming ORIF with generalized anesthesia with acceptable risk. - Continue aspirin  - Start low-dose of beta-blocker  Uncontrolled hypertension - Secondary to pain - Resume home BP meds including amlodipine   - Add as needed hydralazine  - Beta-blocker as above  COPD - No acute symptoms or signs of exacerbation - Continue Spiriva  - As needed albuterol   Anxiety/depression - Mentation at baseline, continue Cymbalta   Hypothyroidism - Continue Synthroid   Moderate protein calorie malnutrition - BMI= 18 - Start Ensure  Total time spent on patient care 55 minutes  DVT prophylaxis: Lovenox  Code Status: DNR Family Communication: Daughter at bedside Disposition Plan: Patient is sick with left hip fracture requiring ORIF, expect more than 2 midnight hospital stay Consults called: Orthopedic surgery Admission status: MedSurg admission   Cort ONEIDA Mana MD [facility] Pager [phone] 03/25/2024, 8:50 AM

## 2024-03-26 ENCOUNTER — Encounter: Payer: Self-pay | Admitting: Orthopedic Surgery

## 2024-03-26 DIAGNOSIS — S72002A Fracture of unspecified part of neck of left femur, initial encounter for closed fracture: Secondary | ICD-10-CM | POA: Diagnosis not present

## 2024-04-11 ENCOUNTER — Other Ambulatory Visit

## 2024-04-11 ENCOUNTER — Ambulatory Visit

## 2024-04-16 NOTE — Progress Notes (Signed)
 PT's daughter came to bedside. She says pt has been ready to go home for a long time and is glad she died in her sleep. She took pt's shoe, glasses and hearing aid. She gave name of preferred funeral home and says she doesn't want medical examiner, just send body to funeral home.

## 2024-04-16 NOTE — Progress Notes (Signed)
 Called about the patient found without vital signs.  She had no heart sounds and no carotid pulses and her pupils were dilated and fixed.  She was pronounced dead at 02;40.  Her daughter was notified and informed about the possible causes of sudden death including massive MI, massive stroke postoperatively.  Dr. Tobie was notified about the patient's death as well.  Her body will be released to the funeral home of the family's choice.  Death Certificate will be signed and death summary will be performed by her attending physician Dr. Laurita in AM.

## 2024-04-16 NOTE — Discharge Summary (Signed)
 Physician Discharge Summary   Patient: Robyn Butler MRN: [id number] DOB: 1929-01-22  Admit date:     03/24/2024  Discharge date: 04-10-2024  Discharge Physician: Cort ONEIDA Mana   PCP: Lenon Layman ORN, MD    Discharge Diagnoses: Principal Problem: Cardiopulmonary arrest   Closed left hip fracture [facility]) Active Problems:   Closed comminuted intertrochanteric fracture of proximal end of left femur (HCC)   Hip fracture [facility])   Hospital Course:  89 year old female patient with past medical history of COPD, HTN, HLD, hypothyroidism, CKD stage IIIa, diet-controlled diabetes, osteoporosis presented with mechanical fall at home and left hip fracture.  Patient sustained a mechanical fall and ended up having a left hip intertrochanteric fracture requiring ORIF.  Before surgery, patient was medically evaluated, with a calculated perioperative major cardiac risk 0.5%, and considered to be fit for ORIF with acceptable medical risk preoperation echo showed normal LVEF with no major or significant valvular cardiac problems, small pericardial effusion without signs of tamponade.  Patient tolerated ORIF procedure well without incident.  Same night, patient was found to have a cardiopulmonary arrest, possible etiology was considered to be massive MI, massive stroke postoperatively.  Patient CODE STATUS was DNR/DNI, no CPR performed.       Consultants: Orthopedic surgery Procedures performed: ORIF left hip intertrochanteric fracture Disposition: Morgue  DISCHARGE MEDICATION: Allergies as of 04/10/24       Reactions   Alendronate Nausea And Vomiting   Other reaction(s): Vomiting        Medication List     ASK your doctor about these medications    amLODipine  5 MG tablet Commonly known as: NORVASC  Take 5 mg by mouth daily.   aspirin  EC 81 MG tablet Take 81 mg by mouth daily.   Cranberry 500 MG Caps Take 500 mg by mouth daily.   cyanocobalamin  1000 MCG tablet Take 1,000 mcg by  mouth daily.   diphenhydramine-acetaminophen  25-500 MG Tabs tablet Commonly known as: TYLENOL  PM Take 1 tablet by mouth at bedtime as needed.   dorzolamide  2 % ophthalmic solution Commonly known as: TRUSOPT  Place 1 drop into both eyes 2 (two) times daily.   DULCOLAX BALANCE PO Take 2 tablets by mouth as needed.   DULoxetine  30 MG capsule Commonly known as: CYMBALTA  Take 30 mg by mouth daily.   latanoprost  0.005 % ophthalmic solution Commonly known as: XALATAN  Place 1 drop into both eyes at bedtime.   loperamide  2 MG capsule Commonly known as: IMODIUM  Take 2 mg by mouth as needed for diarrhea or loose stools.   magnesium  oxide 400 MG tablet Commonly known as: MAG-OX Take 400 mg by mouth daily.   oxybutynin  5 MG 24 hr tablet Commonly known as: DITROPAN -XL Take 5 mg by mouth daily.   pregabalin  50 MG capsule Commonly known as: LYRICA  Take 50 mg by mouth 2 (two) times daily.   PreserVision AREDS 2 Caps Take 1 capsule by mouth 2 (two) times daily.   Spiriva  Respimat 2.5 MCG/ACT Aers Generic drug: Tiotropium Bromide  Monohydrate SMARTSIG:2 Puff(s) By Mouth Daily   valsartan -hydrochlorothiazide  160-25 MG tablet Commonly known as: DIOVAN -HCT Take 1 tablet by mouth daily.   Vitamin D3 50 MCG (2000 UT) Tabs Take 1 tablet by mouth daily.   zinc sulfate (50mg  elemental zinc) 220 (50 Zn) MG capsule Take 220 mg by mouth daily.        Discharge Exam: Filed Weights   03/24/24 2217  Weight: 46.2 kg   Pupils are dilated, no heart sound or  breathing sound appreciated no peripheral pulses appreciated.   The results of significant diagnostics from this hospitalization (including imaging, microbiology, ancillary and laboratory) are listed below for reference.   Imaging Studies: DG HIP UNILAT W OR W/O PELVIS 2-3 VIEWS LEFT Result Date: 03/25/2024 CLINICAL DATA:  Left hip surgery EXAM: DG HIP (WITH OR WITHOUT PELVIS) 2-3V LEFT COMPARISON:  03/24/2024 FINDINGS: Four  fluoroscopic images are obtained during the performance of the procedure and are provided for interpretation only. Images demonstrate intramedullary rod with proximal dynamic screws and distal interlocking screw traversing the comminuted intertrochanteric left hip fracture seen previously. Alignment is near anatomic. Please refer to the operative report. Fluoroscopy time: 1 minute 10 seconds, 9.33 mGy IMPRESSION: 1. ORIF of a comminuted intertrochanteric left hip fracture, with near anatomic alignment. Electronically Signed   By: Ozell Daring M.D.   On: 03/25/2024 18:08   DG C-Arm 1-60 Min-No Report Result Date: 03/25/2024 Fluoroscopy was utilized by the requesting physician.  No radiographic interpretation.   ECHOCARDIOGRAM COMPLETE Result Date: 03/25/2024    ECHOCARDIOGRAM REPORT   Patient Name:   Robyn Butler Date of Exam: 03/25/2024 Medical Rec #:  [id number]      Height:       63.0 in Accession #:    [id number]     Weight:       101.9 lb Date of Birth:  June 05, 1929       BSA:          1.452 m Patient Age:    89 years       BP:           182/67 mmHg Patient Gender: F              HR:           88 bpm. Exam Location:  [facility] Procedure: 2D Echo, Cardiac Doppler and Color Doppler (Both Spectral and Color            Flow Doppler were utilized during procedure). Indications:     Pre-operative cardiovascular examination Z01.810  History:         Patient has no prior history of Echocardiogram examinations.                  COPD; Risk Factors:Diabetes and Hypertension.  Sonographer:     Christopher Furnace Referring Phys:  [id number] CORT ONEIDA MANA Diagnosing Phys: Lonni Hanson MD IMPRESSIONS  1. Left ventricular ejection fraction, by estimation, is 65 to 70%. The left ventricle has normal function. The left ventricle has no regional wall motion abnormalities. There is mild left ventricular hypertrophy. Left ventricular diastolic parameters are consistent with Grade I diastolic dysfunction (impaired relaxation). Elevated left  atrial pressure.  2. Right ventricular systolic function is normal. The right ventricular size is normal. There is normal pulmonary artery systolic pressure.  3. Left atrial size was mildly dilated.  4. A small pericardial effusion is present. There is no evidence of cardiac tamponade.  5. The mitral valve is degenerative. Trivial mitral valve regurgitation. No evidence of mitral stenosis.  6. The aortic valve has an indeterminant number of cusps. Aortic valve regurgitation is not visualized. No aortic stenosis is present.  7. The inferior vena cava is normal in size with greater than 50% respiratory variability, suggesting right atrial pressure of 3 mmHg. FINDINGS  Left Ventricle: Left ventricular ejection fraction, by estimation, is 65 to 70%. The left ventricle has normal function. The left ventricle has no regional wall motion abnormalities. The  left ventricular internal cavity size was normal in size. There is  mild left ventricular hypertrophy. Left ventricular diastolic parameters are consistent with Grade I diastolic dysfunction (impaired relaxation). Elevated left atrial pressure. Right Ventricle: The right ventricular size is normal. No increase in right ventricular wall thickness. Right ventricular systolic function is normal. There is normal pulmonary artery systolic pressure. The tricuspid regurgitant velocity is 2.20 m/s, and  with an assumed right atrial pressure of 3 mmHg, the estimated right ventricular systolic pressure is 22.4 mmHg. Left Atrium: Left atrial size was mildly dilated. Right Atrium: Right atrial size was normal in size. Pericardium: A small pericardial effusion is present. There is no evidence of cardiac tamponade. Mitral Valve: The mitral valve is degenerative in appearance. There is mild thickening of the mitral valve leaflet(s). Mild mitral annular calcification. Trivial mitral valve regurgitation. No evidence of mitral valve stenosis. MV peak gradient, 7.6 mmHg. The mean mitral  valve gradient is 3.0 mmHg. Tricuspid Valve: The tricuspid valve is not well visualized. Tricuspid valve regurgitation is trivial. Aortic Valve: The aortic valve has an indeterminant number of cusps. Aortic valve regurgitation is not visualized. No aortic stenosis is present. Aortic valve mean gradient measures 4.0 mmHg. Aortic valve peak gradient measures 6.1 mmHg. Aortic valve area, by VTI measures 2.81 cm. Pulmonic Valve: The pulmonic valve was not well visualized. Pulmonic valve regurgitation is not visualized. No evidence of pulmonic stenosis. Aorta: The aortic root is normal in size and structure. Pulmonary Artery: The pulmonary artery is not well seen. Venous: The inferior vena cava is normal in size with greater than 50% respiratory variability, suggesting right atrial pressure of 3 mmHg. IAS/Shunts: The interatrial septum was not well visualized.  LEFT VENTRICLE PLAX 2D LVIDd:         3.63 cm   Diastology LVIDs:         2.62 cm   LV e' medial:    4.45 cm/s LV PW:         0.98 cm   LV E/e' medial:  17.6 LV IVS:        1.08 cm   LV e' lateral:   5.80 cm/s LVOT diam:     2.20 cm   LV E/e' lateral: 13.5 LV SV:         66 LV SV Index:   45 LVOT Area:     3.80 cm  RIGHT VENTRICLE RV Basal diam:  3.01 cm RV Mid diam:    2.38 cm LEFT ATRIUM             Index        RIGHT ATRIUM           Index LA diam:        2.30 cm 1.58 cm/m   RA Area:     11.40 cm LA Vol (A2C):   57.3 ml 39.48 ml/m  RA Volume:   25.00 ml  17.22 ml/m LA Vol (A4C):   46.3 ml 31.90 ml/m LA Biplane Vol: 52.3 ml 36.03 ml/m  AORTIC VALVE AV Area (Vmax):    2.81 cm AV Area (Vmean):   2.79 cm AV Area (VTI):     2.81 cm AV Vmax:           123.00 cm/s AV Vmean:          86.400 cm/s AV VTI:            0.234 m AV Peak Grad:      6.1 mmHg AV  Mean Grad:      4.0 mmHg LVOT Vmax:         90.90 cm/s LVOT Vmean:        63.500 cm/s LVOT VTI:          0.173 m LVOT/AV VTI ratio: 0.74  AORTA Ao Root diam: 3.10 cm MITRAL VALVE                TRICUSPID VALVE  MV Area (PHT): 2.78 cm     TR Peak grad:   19.4 mmHg MV Area VTI:   2.85 cm     TR Vmax:        220.00 cm/s MV Peak grad:  7.6 mmHg MV Mean grad:  3.0 mmHg     SHUNTS MV Vmax:       1.38 m/s     Systemic VTI:  0.17 m MV Vmean:      80.5 cm/s    Systemic Diam: 2.20 cm MV Decel Time: 273 msec MV E velocity: 78.10 cm/s MV A velocity: 147.00 cm/s MV E/A ratio:  0.53 Lonni End MD Electronically signed by Lonni Hanson MD Signature Date/Time: 03/25/2024/3:41:31 PM    Final    CT Head Wo Contrast Result Date: 03/24/2024 EXAM: CT HEAD AND CERVICAL SPINE 03/24/2024 10:50:09 PM TECHNIQUE: CT of the head and cervical spine was performed without the administration of intravenous contrast. Multiplanar reformatted images are provided for review. Automated exposure control, iterative reconstruction, and/or weight based adjustment of the mA/kV was utilized to reduce the radiation dose to as low as reasonably achievable. COMPARISON: CT cervical spine 08/03/2021 and CT head 08/03/2021 CLINICAL HISTORY: Fall. Patient arrives via EMS from home for fall; laceration to left side of head and left hip/thigh pain; no LOC. FINDINGS: CT HEAD BRAIN AND VENTRICLES: No acute intracranial hemorrhage. No mass effect or midline shift. No abnormal extra-axial fluid collection. No evidence of acute infarct. No hydrocephalus. Chronic microvascular ischemia and generalized atrophy. ORBITS: No acute abnormality. SINUSES AND MASTOIDS: No acute abnormality. SOFT TISSUES AND SKULL: Left posterolateral scalp hematoma. No calvarial fracture. CT CERVICAL SPINE BONES AND ALIGNMENT: No acute fracture or traumatic malalignment. DEGENERATIVE CHANGES: Posterior disc bulge at C3-C4 causes moderate effacement of the thecal sac. Degenerative change at the atlantoaxial joint is similar to prior. SOFT TISSUES: No prevertebral soft tissue swelling. Emphysema in the visualized upper lungs. IMPRESSION: 1. No acute intracranial abnormality. 2. Left posterolateral  scalp hematoma. No calvarial fracture. 3.  No acute fracture or traumatic malalignment of the cervical spine. Electronically signed by: Norman Gatlin MD 03/24/2024 11:04 PM EDT RP Workstation: [id number]   CT Cervical Spine Wo Contrast Result Date: 03/24/2024 EXAM: CT HEAD AND CERVICAL SPINE 03/24/2024 10:50:09 PM TECHNIQUE: CT of the head and cervical spine was performed without the administration of intravenous contrast. Multiplanar reformatted images are provided for review. Automated exposure control, iterative reconstruction, and/or weight based adjustment of the mA/kV was utilized to reduce the radiation dose to as low as reasonably achievable. COMPARISON: CT cervical spine 08/03/2021 and CT head 08/03/2021 CLINICAL HISTORY: Fall. Patient arrives via EMS from home for fall; laceration to left side of head and left hip/thigh pain; no LOC. FINDINGS: CT HEAD BRAIN AND VENTRICLES: No acute intracranial hemorrhage. No mass effect or midline shift. No abnormal extra-axial fluid collection. No evidence of acute infarct. No hydrocephalus. Chronic microvascular ischemia and generalized atrophy. ORBITS: No acute abnormality. SINUSES AND MASTOIDS: No acute abnormality. SOFT TISSUES AND SKULL: Left posterolateral scalp hematoma. No calvarial fracture.  CT CERVICAL SPINE BONES AND ALIGNMENT: No acute fracture or traumatic malalignment. DEGENERATIVE CHANGES: Posterior disc bulge at C3-C4 causes moderate effacement of the thecal sac. Degenerative change at the atlantoaxial joint is similar to prior. SOFT TISSUES: No prevertebral soft tissue swelling. Emphysema in the visualized upper lungs. IMPRESSION: 1. No acute intracranial abnormality. 2. Left posterolateral scalp hematoma. No calvarial fracture. 3.  No acute fracture or traumatic malalignment of the cervical spine. Electronically signed by: Norman Gatlin MD 03/24/2024 11:04 PM EDT RP Workstation: [id number]   DG Femur Min 2 Views Left Result Date: 03/24/2024 EXAM:  2 VIEW(S) XRAY OF THE LEFT FEMUR 03/24/2024 10:39:56 PM COMPARISON: None available. CLINICAL HISTORY: Fall. Pt to ED via EMS from home, pt reports she fell tonight injuring her left hip/femur, pt also has laceration to side of left head, wrapped and bleeding controlled. FINDINGS: BONES AND JOINTS: Acute intertrochanteric left femoral fracture with impaction and varus angulation of distal fracture fragment. SOFT TISSUES: Vascular calcifications. IMPRESSION: 1. Acute intertrochanteric left femur fracture with impaction and varus angulation of distal fracture fragment. Electronically signed by: Donnice Mania MD 03/24/2024 10:50 PM EDT RP Workstation: [id number]   DG Chest 1 View Result Date: 03/24/2024 CLINICAL DATA:  Fall EXAM: CHEST  1 VIEW COMPARISON:  08/29/2023 FINDINGS: Skin fold artifact over the left chest. No acute airspace disease, pleural effusion or pneumothorax. Scarring or atelectasis left base. Borderline cardiac enlargement. Aortic atherosclerosis. IMPRESSION: No active disease. Borderline cardiomegaly. Electronically Signed   By: Luke Bun M.D.   On: 03/24/2024 22:49   DG Hip Unilat W or Wo Pelvis 2-3 Views Left Result Date: 03/24/2024 EXAM: 2 or more VIEW(S) XRAY OF THE LEFT HIP 03/24/2024 10:39:56 PM COMPARISON: None available. CLINICAL HISTORY: Fall. Pt to ED via EMS from home, pt reports she fell tonight injuring her left hip/femur, pt also has laceration to side of left head, wrapped and bleeding controlled. FINDINGS: BONES AND JOINTS: Comminuted intertrochanteric left femur fracture with varus angulation. Total right hip arthroplasty in place. Multilevel degenerative changes of the spine. SOFT TISSUES: The soft tissues are unremarkable. Vascular calcifications. OTHER: Cholecystectomy clips noted. IMPRESSION: 1. Comminuted intertrochanteric left femur fracture with varus angulation. Electronically signed by: Donnice Mania MD 03/24/2024 10:47 PM EDT RP Workstation: [id number]     Microbiology: Results for orders placed or performed during the hospital encounter of 08/25/23  SARS Coronavirus 2 by RT PCR (hospital order, performed in [facility] hospital lab) *cepheid single result test* Anterior Nasal Swab     Status: None   Collection Time: 08/29/23 10:30 AM   Specimen: Anterior Nasal Swab  Result Value Ref Range Status   SARS Coronavirus 2 by RT PCR NEGATIVE NEGATIVE Final    Comment: (NOTE) SARS-CoV-2 target nucleic acids are NOT DETECTED.  The SARS-CoV-2 RNA is generally detectable in upper and lower respiratory specimens during the acute phase of infection. The lowest concentration of SARS-CoV-2 viral copies this assay can detect is 250 copies / mL. A negative result does not preclude SARS-CoV-2 infection and should not be used as the sole basis for treatment or other patient management decisions.  A negative result may occur with improper specimen collection / handling, submission of specimen other than nasopharyngeal swab, presence of viral mutation(s) within the areas targeted by this assay, and inadequate number of viral copies (<250 copies / mL). A negative result must be combined with clinical observations, patient history, and epidemiological information.  Fact Sheet for Patients:   RoadLapTop.co.za  Fact Sheet for Healthcare  Providers: http://kim-miller.com/  This test is not yet approved or  cleared by the [place]  FDA and has been authorized for detection and/or diagnosis of SARS-CoV-2 by FDA under an Emergency Use Authorization (EUA).  This EUA will remain in effect (meaning this test can be used) for the duration of the COVID-19 declaration under Section 564(b)(1) of the Act, 21 U.S.C. section 360bbb-3(b)(1), unless the authorization is terminated or revoked sooner.  Performed at [facility], [address] [place], [place] [address]   Respiratory (~20 pathogens) panel by  PCR     Status: None   Collection Time: 08/29/23 10:30 AM   Specimen: Nasopharyngeal Swab; Respiratory  Result Value Ref Range Status   Adenovirus NOT DETECTED NOT DETECTED Final   Coronavirus 229E NOT DETECTED NOT DETECTED Final    Comment: (NOTE) The Coronavirus on the Respiratory Panel, DOES NOT test for the novel  Coronavirus (2019 nCoV)    Coronavirus HKU1 NOT DETECTED NOT DETECTED Final   Coronavirus NL63 NOT DETECTED NOT DETECTED Final   Coronavirus OC43 NOT DETECTED NOT DETECTED Final   Metapneumovirus NOT DETECTED NOT DETECTED Final   Rhinovirus / Enterovirus NOT DETECTED NOT DETECTED Final   Influenza A NOT DETECTED NOT DETECTED Final   Influenza B NOT DETECTED NOT DETECTED Final   Parainfluenza Virus 1 NOT DETECTED NOT DETECTED Final   Parainfluenza Virus 2 NOT DETECTED NOT DETECTED Final   Parainfluenza Virus 3 NOT DETECTED NOT DETECTED Final   Parainfluenza Virus 4 NOT DETECTED NOT DETECTED Final   Respiratory Syncytial Virus NOT DETECTED NOT DETECTED Final   Bordetella pertussis NOT DETECTED NOT DETECTED Final   Bordetella Parapertussis NOT DETECTED NOT DETECTED Final   Chlamydophila pneumoniae NOT DETECTED NOT DETECTED Final   Mycoplasma pneumoniae NOT DETECTED NOT DETECTED Final    Comment: Performed at [facility], [address] [place], [place] [address]    Labs: CBC: No results for input(s): WBC, NEUTROABS, HGB, HCT, MCV, PLT in the last 168 hours. Basic Metabolic Panel: No results for input(s): NA, K, CL, CO2, GLUCOSE, BUN, CREATININE, CALCIUM , MG, PHOS in the last 168 hours. Liver Function Tests: No results for input(s): AST, ALT, ALKPHOS, BILITOT, PROT, ALBUMIN  in the last 168 hours. CBG: No results for input(s): GLUCAP in the last 168 hours.  Discharge time spent: less than 30 minutes.  Signed: Cort ONEIDA Mana, MD [facility] 04/02/2024

## 2024-04-16 NOTE — Progress Notes (Signed)
 Pt has been alert and able to verbalize needs all evening.she was able to take her medication without problems. Surgical site intact with old drainage that was marked and remains same size.At midnight she ws c/o feeling hot and kept taking off her clothes. She was also grabbing her abdomen and saying she feels sick. Vital signs similar to prior vitals. No respiratory distress on 2 l/[employer] Asked if she has nausea she didn't nod. I gave her and emesis basis and encouraged her to keep her arm straight so she can receive her IV medication as IV kept being. Other nurse on floor disconnected IV because she couldn't keep arm straight and kept setting off the alarm and we planned to get ne IV in different location so she can continue maintenance fluids. At 0230 charge nurse called me to the room because pt was unresponsive . Dr Lawence was notified and came to bedside to assess and pronounced pt dead an 98.

## 2024-04-16 DEATH — deceased

## 2024-05-12 ENCOUNTER — Ambulatory Visit

## 2024-05-12 ENCOUNTER — Other Ambulatory Visit

## 2024-06-11 ENCOUNTER — Other Ambulatory Visit

## 2024-06-11 ENCOUNTER — Ambulatory Visit

## 2024-07-29 ENCOUNTER — Other Ambulatory Visit

## 2024-07-29 ENCOUNTER — Ambulatory Visit

## 2024-07-29 ENCOUNTER — Ambulatory Visit: Admitting: Internal Medicine
